# Patient Record
Sex: Female | Born: 1955
Health system: Southern US, Community
[De-identification: ages and names within clinical notes are randomized; demographics above are authoritative.]

## PROBLEM LIST (undated history)

## (undated) DIAGNOSIS — I700 Atherosclerosis of aorta: Secondary | ICD-10-CM

## (undated) DIAGNOSIS — R0609 Other forms of dyspnea: Secondary | ICD-10-CM

## (undated) DIAGNOSIS — I209 Angina pectoris, unspecified: Secondary | ICD-10-CM

## (undated) DIAGNOSIS — M069 Rheumatoid arthritis, unspecified: Secondary | ICD-10-CM

## (undated) DIAGNOSIS — G4733 Obstructive sleep apnea (adult) (pediatric): Secondary | ICD-10-CM

## (undated) DIAGNOSIS — I872 Venous insufficiency (chronic) (peripheral): Secondary | ICD-10-CM

## (undated) DIAGNOSIS — J45909 Unspecified asthma, uncomplicated: Secondary | ICD-10-CM

## (undated) DIAGNOSIS — R0600 Dyspnea, unspecified: Secondary | ICD-10-CM

## (undated) DIAGNOSIS — E119 Type 2 diabetes mellitus without complications: Secondary | ICD-10-CM

## (undated) DIAGNOSIS — M359 Systemic involvement of connective tissue, unspecified: Secondary | ICD-10-CM

## (undated) DIAGNOSIS — K5790 Diverticulosis of intestine, part unspecified, without perforation or abscess without bleeding: Secondary | ICD-10-CM

## (undated) DIAGNOSIS — I10 Essential (primary) hypertension: Secondary | ICD-10-CM

## (undated) DIAGNOSIS — I739 Peripheral vascular disease, unspecified: Secondary | ICD-10-CM

## (undated) DIAGNOSIS — E785 Hyperlipidemia, unspecified: Secondary | ICD-10-CM

## (undated) DIAGNOSIS — M1990 Unspecified osteoarthritis, unspecified site: Secondary | ICD-10-CM

## (undated) DIAGNOSIS — K219 Gastro-esophageal reflux disease without esophagitis: Secondary | ICD-10-CM

## (undated) DIAGNOSIS — Z79899 Other long term (current) drug therapy: Secondary | ICD-10-CM

## (undated) DIAGNOSIS — N289 Disorder of kidney and ureter, unspecified: Secondary | ICD-10-CM

## (undated) DIAGNOSIS — J189 Pneumonia, unspecified organism: Secondary | ICD-10-CM

## (undated) DIAGNOSIS — I2510 Atherosclerotic heart disease of native coronary artery without angina pectoris: Secondary | ICD-10-CM

## (undated) DIAGNOSIS — D649 Anemia, unspecified: Secondary | ICD-10-CM

## (undated) DIAGNOSIS — E039 Hypothyroidism, unspecified: Secondary | ICD-10-CM

## (undated) DIAGNOSIS — L4050 Arthropathic psoriasis, unspecified: Secondary | ICD-10-CM

## (undated) DIAGNOSIS — E538 Deficiency of other specified B group vitamins: Secondary | ICD-10-CM

## (undated) DIAGNOSIS — J449 Chronic obstructive pulmonary disease, unspecified: Secondary | ICD-10-CM

## (undated) DIAGNOSIS — M810 Age-related osteoporosis without current pathological fracture: Secondary | ICD-10-CM

## (undated) DIAGNOSIS — C73 Malignant neoplasm of thyroid gland: Secondary | ICD-10-CM

## (undated) DIAGNOSIS — G5600 Carpal tunnel syndrome, unspecified upper limb: Secondary | ICD-10-CM

## (undated) DIAGNOSIS — I890 Lymphedema, not elsewhere classified: Secondary | ICD-10-CM

## (undated) DIAGNOSIS — R600 Localized edema: Secondary | ICD-10-CM

## (undated) DIAGNOSIS — I509 Heart failure, unspecified: Secondary | ICD-10-CM

## (undated) DIAGNOSIS — M75121 Complete rotator cuff tear or rupture of right shoulder, not specified as traumatic: Secondary | ICD-10-CM

## (undated) DIAGNOSIS — G4730 Sleep apnea, unspecified: Secondary | ICD-10-CM

## (undated) DIAGNOSIS — Z7960 Long term (current) use of unspecified immunomodulators and immunosuppressants: Secondary | ICD-10-CM

## (undated) DIAGNOSIS — M25561 Pain in right knee: Secondary | ICD-10-CM

## (undated) DIAGNOSIS — Z7982 Long term (current) use of aspirin: Secondary | ICD-10-CM

## (undated) HISTORY — DX: Chronic obstructive pulmonary disease, unspecified: J44.9

## (undated) HISTORY — PX: COLON SURGERY: SHX602

## (undated) HISTORY — DX: Rheumatoid arthritis, unspecified: M06.9

## (undated) HISTORY — DX: Anemia, unspecified: D64.9

## (undated) HISTORY — DX: Hyperlipidemia, unspecified: E78.5

## (undated) HISTORY — DX: Gastro-esophageal reflux disease without esophagitis: K21.9

## (undated) HISTORY — PX: CARDIAC SURGERY: SHX584

## (undated) HISTORY — DX: Lymphedema, not elsewhere classified: I89.0

## (undated) HISTORY — DX: Sleep apnea, unspecified: G47.30

## (undated) HISTORY — DX: Malignant neoplasm of thyroid gland: C73

---

## 1994-03-14 HISTORY — PX: OTHER SURGICAL HISTORY: SHX169

## 1997-03-14 HISTORY — PX: OTHER SURGICAL HISTORY: SHX169

## 1998-03-14 HISTORY — PX: BREAST BIOPSY: SHX20

## 1999-03-15 HISTORY — PX: OTHER SURGICAL HISTORY: SHX169

## 2001-03-14 HISTORY — PX: ABDOMINAL HYSTERECTOMY: SHX81

## 2004-03-14 DIAGNOSIS — I219 Acute myocardial infarction, unspecified: Secondary | ICD-10-CM

## 2004-03-14 HISTORY — DX: Acute myocardial infarction, unspecified: I21.9

## 2004-03-25 ENCOUNTER — Ambulatory Visit: Payer: Self-pay | Admitting: Podiatry

## 2004-03-31 ENCOUNTER — Ambulatory Visit: Payer: Self-pay | Admitting: Podiatry

## 2004-05-03 ENCOUNTER — Ambulatory Visit: Payer: Self-pay | Admitting: Family Medicine

## 2004-06-11 ENCOUNTER — Ambulatory Visit: Payer: Self-pay | Admitting: Podiatry

## 2004-06-12 ENCOUNTER — Emergency Department: Payer: Self-pay | Admitting: Emergency Medicine

## 2005-01-06 ENCOUNTER — Inpatient Hospital Stay: Payer: Self-pay | Admitting: Internal Medicine

## 2005-01-06 ENCOUNTER — Other Ambulatory Visit: Payer: Self-pay

## 2005-01-06 DIAGNOSIS — I214 Non-ST elevation (NSTEMI) myocardial infarction: Secondary | ICD-10-CM

## 2005-01-06 HISTORY — DX: Non-ST elevation (NSTEMI) myocardial infarction: I21.4

## 2005-01-07 ENCOUNTER — Other Ambulatory Visit: Payer: Self-pay

## 2005-01-07 DIAGNOSIS — I2510 Atherosclerotic heart disease of native coronary artery without angina pectoris: Secondary | ICD-10-CM

## 2005-01-07 HISTORY — DX: Atherosclerotic heart disease of native coronary artery without angina pectoris: I25.10

## 2005-01-07 HISTORY — PX: LEFT HEART CATH AND CORONARY ANGIOGRAPHY: CATH118249

## 2005-01-11 ENCOUNTER — Ambulatory Visit: Payer: Self-pay | Admitting: Internal Medicine

## 2005-05-04 ENCOUNTER — Ambulatory Visit: Payer: Self-pay | Admitting: Family Medicine

## 2006-02-06 ENCOUNTER — Other Ambulatory Visit: Payer: Self-pay

## 2006-02-07 ENCOUNTER — Observation Stay: Payer: Self-pay | Admitting: Internal Medicine

## 2006-05-09 ENCOUNTER — Ambulatory Visit: Payer: Self-pay | Admitting: Family Medicine

## 2006-08-14 ENCOUNTER — Ambulatory Visit: Payer: Self-pay | Admitting: Internal Medicine

## 2006-10-17 ENCOUNTER — Ambulatory Visit: Payer: Self-pay | Admitting: Gastroenterology

## 2007-05-21 ENCOUNTER — Ambulatory Visit: Payer: Self-pay | Admitting: Family Medicine

## 2008-01-04 ENCOUNTER — Ambulatory Visit: Payer: Self-pay | Admitting: Internal Medicine

## 2008-01-10 ENCOUNTER — Ambulatory Visit: Payer: Self-pay | Admitting: Internal Medicine

## 2008-01-10 HISTORY — PX: LEFT HEART CATH AND CORONARY ANGIOGRAPHY: CATH118249

## 2008-06-05 ENCOUNTER — Ambulatory Visit: Payer: Self-pay | Admitting: Family Medicine

## 2008-11-13 ENCOUNTER — Ambulatory Visit: Payer: Self-pay | Admitting: Unknown Physician Specialty

## 2009-03-19 ENCOUNTER — Ambulatory Visit: Payer: Self-pay | Admitting: Unknown Physician Specialty

## 2010-04-20 ENCOUNTER — Ambulatory Visit: Payer: Self-pay | Admitting: Unknown Physician Specialty

## 2010-10-11 ENCOUNTER — Ambulatory Visit: Payer: Self-pay

## 2011-04-28 ENCOUNTER — Ambulatory Visit: Payer: Self-pay | Admitting: Internal Medicine

## 2011-05-05 ENCOUNTER — Ambulatory Visit: Payer: Self-pay | Admitting: Ophthalmology

## 2011-05-05 LAB — HEMOGLOBIN: HGB: 12.3 g/dL (ref 12.0–16.0)

## 2011-05-18 ENCOUNTER — Ambulatory Visit: Payer: Self-pay | Admitting: Ophthalmology

## 2011-06-24 ENCOUNTER — Ambulatory Visit: Payer: Self-pay | Admitting: Ophthalmology

## 2011-06-24 LAB — HEMOGLOBIN: HGB: 12.1 g/dL (ref 12.0–16.0)

## 2011-07-05 ENCOUNTER — Ambulatory Visit: Payer: Self-pay | Admitting: Ophthalmology

## 2012-01-04 ENCOUNTER — Ambulatory Visit: Payer: Self-pay | Admitting: Internal Medicine

## 2012-01-04 HISTORY — PX: LEFT HEART CATH AND CORONARY ANGIOGRAPHY: CATH118249

## 2012-03-14 DIAGNOSIS — Z9841 Cataract extraction status, right eye: Secondary | ICD-10-CM

## 2012-03-14 HISTORY — PX: CATARACT EXTRACTION, BILATERAL: SHX1313

## 2012-03-14 HISTORY — DX: Cataract extraction status, left eye: Z98.41

## 2012-03-30 ENCOUNTER — Ambulatory Visit: Payer: Self-pay | Admitting: Otolaryngology

## 2012-06-08 ENCOUNTER — Ambulatory Visit: Payer: Self-pay | Admitting: Otolaryngology

## 2013-03-14 HISTORY — PX: BREAST BIOPSY: SHX20

## 2013-03-14 HISTORY — PX: OTHER SURGICAL HISTORY: SHX169

## 2013-03-14 HISTORY — PX: VEIN SURGERY: SHX48

## 2013-05-14 ENCOUNTER — Ambulatory Visit: Payer: Self-pay | Admitting: Physician Assistant

## 2013-05-28 ENCOUNTER — Ambulatory Visit: Payer: Self-pay | Admitting: Physician Assistant

## 2013-06-17 ENCOUNTER — Ambulatory Visit: Payer: Self-pay | Admitting: Physician Assistant

## 2013-06-20 LAB — PATHOLOGY REPORT

## 2014-05-04 ENCOUNTER — Emergency Department: Payer: Self-pay | Admitting: Emergency Medicine

## 2014-06-18 ENCOUNTER — Ambulatory Visit
Admit: 2014-06-18 | Disposition: A | Discharge status: Home / Self Care | Payer: Self-pay | Attending: Physician Assistant | Admitting: Physician Assistant

## 2014-07-06 NOTE — Op Note (Signed)
PATIENT NAME:  Jacqueline Rhodes, DEPREY MR#:  [id number] DATE OF BIRTH:  01-07-1956  DATE OF PROCEDURE:  05/18/2011  PREOPERATIVE DIAGNOSIS: Visually significant cataract of the left eye.   POSTOPERATIVE DIAGNOSIS: Visually significant cataract of the left eye.   OPERATIVE PROCEDURE: Cataract extraction by phacoemulsification with implant of intraocular lens to left eye.   SURGEON: Birder Robson, MD.   ANESTHESIA:  1. Managed anesthesia care.  2. Topical tetracaine drops followed by 2% Xylocaine jelly applied in the preoperative holding area.   COMPLICATIONS: None.   TECHNIQUE:  Stop and chop  DESCRIPTION OF PROCEDURE: The patient was examined and consented in the preoperative holding area where the aforementioned topical anesthesia was applied to the left eye and then brought back to the Operating Room where the left eye was prepped and draped in the usual sterile ophthalmic fashion and a lid speculum was placed. A paracentesis was created with the side port blade and the anterior chamber was filled with viscoelastic. A near clear corneal incision was performed with the steel keratome. A continuous curvilinear capsulorrhexis was performed with a cystotome followed by the capsulorrhexis forceps. Hydrodissection and hydrodelineation were carried out with BSS on a blunt cannula. The lens was removed in a stop and chop technique and the remaining cortical material was removed with the irrigation-aspiration handpiece. The capsular bag was inflated with viscoelastic and the Technis ZCBOO 22.5-diopter lens, serial number [id number], was placed in the capsular bag without complication. The remaining viscoelastic was removed from the eye with the irrigation-aspiration handpiece. The wounds were hydrated. The anterior chamber was flushed with Miostat and the eye was inflated to physiologic pressure. The wounds were found to be water tight. The eye was dressed with Vigamox. The patient was given protective  glasses to wear throughout the day and a shield with which to sleep tonight. The patient was also given drops with which to begin a drop regimen today and will follow-up with me in one day.   ____________________________ Livingston Diones. Sher Hellinger, MD wlp:cbb D: 05/18/2011 16:59:09 ET T: 05/18/2011 17:46:54 ET JOB#: [id number]  cc: Ranulfo Kall L. Gerardo Territo, MD, <Dictator> Livingston Diones Morganna Styles MD ELECTRONICALLY SIGNED 05/19/2011 14:36

## 2014-07-06 NOTE — Op Note (Signed)
PATIENT NAME:  Jacqueline Rhodes, Jacqueline Rhodes MR#:  [id number] DATE OF BIRTH:  1955-08-09  DATE OF PROCEDURE:  07/05/2011  PREOPERATIVE DIAGNOSIS: Visually significant cataract of the right eye.   POSTOPERATIVE DIAGNOSIS: Visually significant cataract of the right eye.   OPERATIVE PROCEDURE: Cataract extraction by phacoemulsification with implant of intraocular lens to right eye.   SURGEON: Birder Robson, MD.   ANESTHESIA:  1. Managed anesthesia care.  2. Topical tetracaine drops followed by 2% Xylocaine jelly applied in the preoperative holding area.   COMPLICATIONS: None.   TECHNIQUE:  Stop-and-chop    DESCRIPTION OF PROCEDURE: The patient was examined and consented in the preoperative holding area where the aforementioned topical anesthesia was applied to the right eye and then brought back to the Operating Room where the right eye was prepped and draped in the usual sterile ophthalmic fashion and a lid speculum was placed. A paracentesis was created with the side port blade and the anterior chamber was filled with viscoelastic. A near clear corneal incision was performed with the steel keratome. A continuous curvilinear capsulorrhexis was performed with a cystotome followed by the capsulorrhexis forceps. Hydrodissection and hydrodelineation were carried out with BSS on a blunt cannula. The lens was removed in a stop-and-chop technique and the remaining cortical material was removed with the irrigation-aspiration handpiece. The capsular bag was inflated with viscoelastic and the Technus ZCB00 23.0-diopter lens, serial number [id number] was placed in the capsular bag without complication. The remaining viscoelastic was removed from the eye with the irrigation-aspiration handpiece. The wounds were hydrated. The anterior chamber was flushed with Miostat and the eye was inflated to physiologic pressure. The wounds were found to be water tight. The eye was dressed with Vigamox. The patient was given  protective glasses to wear throughout the day and a shield with which to sleep tonight. The patient was also given drops with which to begin a drop regimen today and will follow-up with me in one day.   ____________________________ Livingston Diones. Nathanyel Defenbaugh, MD wlp:drc D: 07/05/2011 12:28:52 ET T: 07/05/2011 12:36:49 ET JOB#: [id number]  cc: Etienne Millward L. Asna Muldrow, MD, <Dictator> Livingston Diones Bryannah Boston MD ELECTRONICALLY SIGNED 07/12/2011 11:41

## 2015-03-15 HISTORY — PX: BIOPSY THYROID: PRO38

## 2015-03-24 ENCOUNTER — Other Ambulatory Visit: Payer: Self-pay | Admitting: Specialist

## 2015-03-24 DIAGNOSIS — R059 Cough, unspecified: Secondary | ICD-10-CM

## 2015-03-24 DIAGNOSIS — R0602 Shortness of breath: Secondary | ICD-10-CM

## 2015-03-24 DIAGNOSIS — R05 Cough: Secondary | ICD-10-CM

## 2015-03-30 ENCOUNTER — Emergency Department: Payer: BLUE CROSS/BLUE SHIELD

## 2015-03-30 ENCOUNTER — Emergency Department
Admission: EM | Admit: 2015-03-30 | Discharge: 2015-03-30 | Disposition: A | Discharge status: Home / Self Care | Payer: BLUE CROSS/BLUE SHIELD | Attending: Emergency Medicine | Admitting: Emergency Medicine

## 2015-03-30 ENCOUNTER — Encounter: Payer: Self-pay | Admitting: *Deleted

## 2015-03-30 DIAGNOSIS — Z880 Allergy status to penicillin: Secondary | ICD-10-CM | POA: Diagnosis not present

## 2015-03-30 DIAGNOSIS — I2510 Atherosclerotic heart disease of native coronary artery without angina pectoris: Secondary | ICD-10-CM | POA: Insufficient documentation

## 2015-03-30 DIAGNOSIS — E119 Type 2 diabetes mellitus without complications: Secondary | ICD-10-CM | POA: Diagnosis not present

## 2015-03-30 DIAGNOSIS — R000 Tachycardia, unspecified: Secondary | ICD-10-CM | POA: Diagnosis not present

## 2015-03-30 DIAGNOSIS — R509 Fever, unspecified: Secondary | ICD-10-CM | POA: Diagnosis present

## 2015-03-30 DIAGNOSIS — J159 Unspecified bacterial pneumonia: Secondary | ICD-10-CM | POA: Insufficient documentation

## 2015-03-30 DIAGNOSIS — J189 Pneumonia, unspecified organism: Secondary | ICD-10-CM

## 2015-03-30 DIAGNOSIS — I10 Essential (primary) hypertension: Secondary | ICD-10-CM | POA: Insufficient documentation

## 2015-03-30 HISTORY — DX: Essential (primary) hypertension: I10

## 2015-03-30 HISTORY — DX: Type 2 diabetes mellitus without complications: E11.9

## 2015-03-30 HISTORY — DX: Unspecified osteoarthritis, unspecified site: M19.90

## 2015-03-30 HISTORY — DX: Atherosclerotic heart disease of native coronary artery without angina pectoris: I25.10

## 2015-03-30 HISTORY — DX: Systemic involvement of connective tissue, unspecified: M35.9

## 2015-03-30 LAB — CBC WITH DIFFERENTIAL/PLATELET
Basophils Absolute: 0.1 10*3/uL (ref 0–0.1)
Basophils Relative: 1 %
EOS PCT: 0 %
Eosinophils Absolute: 0 10*3/uL (ref 0–0.7)
HEMATOCRIT: 40.9 % (ref 35.0–47.0)
HEMOGLOBIN: 13.5 g/dL (ref 12.0–16.0)
LYMPHS ABS: 1.1 10*3/uL (ref 1.0–3.6)
LYMPHS PCT: 8 %
MCH: 28.5 pg (ref 26.0–34.0)
MCHC: 33.1 g/dL (ref 32.0–36.0)
MCV: 86.1 fL (ref 80.0–100.0)
Monocytes Absolute: 0.5 10*3/uL (ref 0.2–0.9)
Monocytes Relative: 4 %
NEUTROS PCT: 87 %
Neutro Abs: 10.9 10*3/uL — ABNORMAL HIGH (ref 1.4–6.5)
Platelets: 242 10*3/uL (ref 150–440)
RBC: 4.75 MIL/uL (ref 3.80–5.20)
RDW: 15.1 % — ABNORMAL HIGH (ref 11.5–14.5)
WBC: 12.6 10*3/uL — AB (ref 3.6–11.0)

## 2015-03-30 LAB — COMPREHENSIVE METABOLIC PANEL
ALBUMIN: 3.8 g/dL (ref 3.5–5.0)
ALK PHOS: 64 U/L (ref 38–126)
ALT: 17 U/L (ref 14–54)
ANION GAP: 10 (ref 5–15)
AST: 15 U/L (ref 15–41)
BUN: 31 mg/dL — ABNORMAL HIGH (ref 6–20)
CALCIUM: 8.6 mg/dL — AB (ref 8.9–10.3)
CHLORIDE: 102 mmol/L (ref 101–111)
CO2: 25 mmol/L (ref 22–32)
Creatinine, Ser: 0.91 mg/dL (ref 0.44–1.00)
GFR calc non Af Amer: >60 mL/min (ref >60)
GLUCOSE: 125 mg/dL — AB (ref 65–99)
POTASSIUM: 3.5 mmol/L (ref 3.5–5.1)
SODIUM: 137 mmol/L (ref 135–145)
Total Bilirubin: 0.8 mg/dL (ref 0.3–1.2)
Total Protein: 7.2 g/dL (ref 6.5–8.1)

## 2015-03-30 LAB — URINALYSIS COMPLETE WITH MICROSCOPIC (ARMC ONLY)
BACTERIA UA: NONE SEEN
Bilirubin Urine: NEGATIVE
GLUCOSE, UA: NEGATIVE mg/dL
Ketones, ur: NEGATIVE mg/dL
Leukocytes, UA: NEGATIVE
NITRITE: NEGATIVE
Protein, ur: NEGATIVE mg/dL
SPECIFIC GRAVITY, URINE: 1.026 (ref 1.005–1.030)
pH: 5 (ref 5.0–8.0)

## 2015-03-30 MED ORDER — ACETAMINOPHEN 500 MG PO TABS
1000.0000 mg | ORAL_TABLET | Freq: Once | ORAL | Status: AC
  Administered 2015-03-30: 1000 mg via ORAL
  Filled 2015-03-30: qty 2

## 2015-03-30 MED ORDER — CEPHALEXIN 500 MG PO CAPS: 500.0000 mg | ORAL_CAPSULE | Freq: Four times a day (QID) | ORAL | Status: DC

## 2015-03-30 MED ORDER — AZITHROMYCIN 250 MG PO TABS: 250.0000 mg | ORAL_TABLET | Freq: Every day | ORAL | Status: AC

## 2015-03-30 MED ORDER — SODIUM CHLORIDE 0.9 % IV BOLUS (SEPSIS)
1000.0000 mL | Freq: Once | INTRAVENOUS | Status: AC
  Administered 2015-03-30: 1000 mL via INTRAVENOUS

## 2015-03-30 MED ORDER — AZITHROMYCIN 250 MG PO TABS
500.0000 mg | ORAL_TABLET | Freq: Once | ORAL | Status: AC
  Administered 2015-03-30: 500 mg via ORAL
  Filled 2015-03-30: qty 2

## 2015-03-30 MED ORDER — LEVOFLOXACIN IN D5W 750 MG/150ML IV SOLN
750.0000 mg | Freq: Once | INTRAVENOUS | Status: DC
  Filled 2015-03-30: qty 150

## 2015-03-30 MED ORDER — ALBUTEROL SULFATE (2.5 MG/3ML) 0.083% IN NEBU
1.2500 mg | INHALATION_SOLUTION | Freq: Once | RESPIRATORY_TRACT | Status: AC
  Administered 2015-03-30: 1.25 mg via RESPIRATORY_TRACT
  Filled 2015-03-30: qty 3

## 2015-03-30 MED ORDER — IOHEXOL 350 MG/ML SOLN
75.0000 mL | Freq: Once | INTRAVENOUS | Status: AC | PRN
  Administered 2015-03-30: 75 mL via INTRAVENOUS

## 2015-03-30 MED ORDER — METHYLPREDNISOLONE SODIUM SUCC 125 MG IJ SOLR
60.0000 mg | Freq: Once | INTRAMUSCULAR | Status: AC
  Administered 2015-03-30: 60 mg via INTRAVENOUS
  Filled 2015-03-30: qty 2

## 2015-03-30 MED ORDER — DEXTROSE 5 % IV SOLN
1.0000 g | Freq: Once | INTRAVENOUS | Status: AC
  Administered 2015-03-30: 1 g via INTRAVENOUS
  Filled 2015-03-30: qty 10

## 2015-03-30 NOTE — ED Provider Notes (Signed)
[facility] Emergency Department Provider Note  ____________________________________________  Time seen: 77  I have reviewed the triage vital signs and the nursing notes.  History by:  Patient  HISTORY  Chief Complaint Fever and Cough     HPI Jacqueline Rhodes is a 60 y.o. female presents to the emergency department with a bad cough that has been occurring on and off for almost 2 months. She has seen her regular doctor and a pulmonologist. She reports being on antibiotics for times, doxycycline twice and Levaquin twice. She has had some occasional fever over the past 2 months. She presents to the emergency department today because she has had increased coughing and had a notable fever with chills this evening. She reports her temperature initially was 100.2, but then increase to 101. She did take 400 mg of ibuprofen at approximately 4:30. She uses Advair and albuterol. She has been on and off prednisone tapers and is currently on steady 10 mg of prednisone a day.  Patient does have a cardiac history with coronary artery disease. She denies any focal chest pain.   Past Medical History  Diagnosis Date  . Hypertension   . Diabetes mellitus without complication ([place])   . Arthritis   . Coronary artery disease   . Collagen vascular disease ([place])     There are no active problems to display for this patient.   History reviewed. No pertinent past surgical history.  Current Outpatient Rx  Name  Route  Sig  Dispense  Refill  . azithromycin (ZITHROMAX) 250 MG tablet   Oral   Take 1 tablet (250 mg total) by mouth daily. Take 1 tablet daily for 3 days.   4 tablet   0   . cephALEXin (KEFLEX) 500 MG capsule   Oral   Take 1 capsule (500 mg total) by mouth 4 (four) times daily.   28 capsule   0     Allergies Codeine; Flexeril; Penicillins; and Vicodin  History reviewed. No pertinent family history.  Social History Social History  Substance Use Topics   . Smoking status: Never Smoker   . Smokeless tobacco: None  . Alcohol Use: None    Review of Systems  Constitutional: Positive for fever and chills. ENT: Positive for congestion. Cardiovascular: Negative for chest pain. Respiratory: Positive for cough, shortness of breath, and fever. See history of present illness Gastrointestinal: Negative for abdominal pain, vomiting and diarrhea. Genitourinary: Negative for dysuria. Musculoskeletal: No back pain. Skin: Negative for rash. Neurological: Negative for headache or focal weakness   10-point ROS otherwise negative.  ____________________________________________   PHYSICAL EXAM:  VITAL SIGNS: ED Triage Vitals  Enc Vitals Group     BP 03/30/15 1804 147/65 mmHg     Pulse Rate 03/30/15 1804 127     Resp 03/30/15 1804 18     Temp 03/30/15 1804 100.5 F (38.1 C)     Temp Source 03/30/15 1804 Oral     SpO2 03/30/15 1804 93 %     Weight 03/30/15 1804 250 lb (113.399 kg)     Height 03/30/15 1804 5\' 4"  (1.626 m)     Head Cir --      Peak Flow --      Pain Score 03/30/15 1804 9     Pain Loc --      Pain Edu? --      Excl. in [place]? --     Constitutional: Alert and oriented. Notable cough and appearance of congestion and illness.  ENT   Head: Normocephalic and atraumatic.   Nose: Notable rhinorrhea and congestion..       Mouth: No erythema, no swelling   Cardiovascular: Tachycardic at 110, regular rhythm, no murmur noted Respiratory: Notable cough. Mild tachypnea. Coarse breath sounds bilaterally with minimal wheeze.  Gastrointestinal: Soft, no distention. Nontender Back: No muscle spasm, no tenderness, no CVA tenderness. Musculoskeletal: No deformity noted. Nontender with normal range of motion in all extremities.  No noted edema. Neurologic:  Communicative. Normal appearing spontaneous movement in all 4 extremities. No gross focal neurologic deficits are appreciated.  Skin:  Skin is warm, dry. No rash noted. Psychiatric:  Mood and affect are normal. Speech and behavior are normal.  ____________________________________________    LABS (pertinent positives/negatives)  Labs Reviewed  COMPREHENSIVE METABOLIC PANEL - Abnormal; Notable for the following:    Glucose, Bld 125 (*)    BUN 31 (*)    Calcium 8.6 (*)    All other components within normal limits  URINALYSIS COMPLETEWITH MICROSCOPIC ([facility] ONLY) - Abnormal; Notable for the following:    Color, Urine YELLOW (*)    APPearance CLEAR (*)    Hgb urine dipstick 2+ (*)    Squamous Epithelial / LPF 0-5 (*)    All other components within normal limits  CBC WITH DIFFERENTIAL/PLATELET - Abnormal; Notable for the following:    WBC 12.6 (*)    RDW 15.1 (*)    Neutro Abs 10.9 (*)    All other components within normal limits  CULTURE, BLOOD (ROUTINE X 2)  CULTURE, BLOOD (ROUTINE X 2)     ____________________________________________   EKG  ED ECG REPORT I, Shelonda Saxe W, the attending physician, personally viewed and interpreted this ECG.   Date: 03/30/2015  EKG Time: 1813  Rate: 118  Rhythm: Sinus tachycardia  Axis: Rightward axis at 92  Intervals: Normal  ST&T Change: None noted   ____________________________________________    RADIOLOGY  Chest x-ray: FINDINGS: The heart is upper limits of normal in size. There is tortuosity of the thoracic aorta. Chronic appearing bronchitic type lung changes and probable underlying emphysema. Suspect superimposed bronchitis and right middle lobe pneumonia. No pleural effusion. The bony thorax is intact.  IMPRESSION: Bronchitis and right middle lobe pneumonia.  CT chest: IMPRESSION: Patchy airspace consolidation in the right middle lobe, with smaller areas of probable airspace consolidation in the right lower lobe and left lower lobe. The findings are most suggestive of multifocal pneumonia. Follow-up to resolution after empiric treatment is recommended.  Enlargement of the left lobe of  the thyroid gland, containing calcifications. Follow-up with thyroid ultrasound should be considered, as thyroid cancer cannot be excluded.  Large hiatal hernia.  14 mm right 6 o'clock breast mass. Correlation with mammography is recommended.  Atherosclerotic disease of the aorta and coronary arteries. ____________________________________________   PROCEDURES  ____________________________________________   INITIAL IMPRESSION / ASSESSMENT AND PLAN / ED COURSE  Pertinent labs & imaging results that were available during my care of the patient were reviewed by me and considered in my medical decision making (see chart for details).  60 year old female with prolonged symptoms of bronchitis with possible underlying pneumonia. Today's chest x-ray does show a right middle lobe pneumonia. Given that she has been on doxycycline and Levaquin but is continuing to have some form of a pulmonary infection, we will change the coverage by using azithromycin. We will also give her ceftriaxone. I have discussed the patient's penicillin allergy with her. She gets hives. I discussed the use of  a cephalosporin with her and we agree on this treatment. She is receiving 1 L of normal saline due to her tachycardia. We'll also treated with Tylenol for the fever and with a small dose, 1.25 mg, of albuterol.  ----------------------------------------- 8:45 PM on 03/30/2015 -----------------------------------------  Patient appears better than when she initially arrived. Her heart rate is now ranging between 95 and 100. She still has a bit of a cough, some coarse breath sounds, and the appearance of someone who has bronchitis. Dr. Raul Del apparently was going to have a CT scan performed on this patient on Wednesday. As she is in the emergency department with some questions about her pulmonary condition, we'll get a CT now.  ----------------------------------------- 10:16 PM on  03/30/2015 -----------------------------------------  CT confirms patchy airspace consolidation in the right middle lobe with a smaller area in the right lower lobe and left lower lobe.  The patient's heart rate is 92. Her oxygen saturation level has been good throughout. Will continue her on azithromycin as well as Keflex given the difficult and complicated course she has had.   ____________________________________________   FINAL CLINICAL IMPRESSION(S) / ED DIAGNOSES  Final diagnoses:  Community acquired pneumonia      Ahmed Prima, MD 03/30/15 2310

## 2015-03-30 NOTE — ED Notes (Addendum)
States fever, chills, vomiting, and sinus pain, pt states blood tinged productive cough, generalized body aches, states her PCP checked her o2 level rectently and told her it was low and she is scheduled for a CT scan of her chest

## 2015-03-30 NOTE — ED Notes (Signed)
MD at bedside. 

## 2015-03-30 NOTE — Discharge Instructions (Signed)
Your chest x-ray shows a pneumonia in the right middle lobe. CT scan sees small patchy areas in the right lower and left lower as. Take azithromycin as prescribed as well as Keflex. Continue to take the prednisone that has been prescribed for you. Follow with her primary physician as well as with Dr. Raul Del. Return to the emergency department if you have further shortness of breath or if you have any other urgent concerns.  Community-Acquired Pneumonia, Adult Pneumonia is an infection of the lungs. There are different types of pneumonia. One type can develop while a person is in a hospital. A different type, called community-acquired pneumonia, develops in people who are not, or have not recently been, in the hospital or other health care facility.  CAUSES Pneumonia may be caused by bacteria, viruses, or funguses. Community-acquired pneumonia is often caused by Streptococcus pneumonia bacteria. These bacteria are often passed from one person to another by breathing in droplets from the cough or sneeze of an infected person. RISK FACTORS The condition is more likely to develop in:  People who havechronic diseases, such as chronic obstructive pulmonary disease (COPD), asthma, congestive heart failure, cystic fibrosis, diabetes, or kidney disease.  People who haveearly-stage or late-stage HIV.  People who havesickle cell disease.  People who havehad their spleen removed (splenectomy).  People who havepoor Human resources officer.  People who havemedical conditions that increase the risk of breathing in (aspirating) secretions their own mouth and nose.   People who havea weakened immune system (immunocompromised).  People who smoke.  People whotravel to areas where pneumonia-causing germs commonly exist.  People whoare around animal habitats or animals that have pneumonia-causing germs, including birds, bats, rabbits, cats, and farm animals. SYMPTOMS Symptoms of this condition  include:  Adry cough.  A wet (productive) cough.  Fever.  Sweating.  Chest pain, especially when breathing deeply or coughing.  Rapid breathing or difficulty breathing.  Shortness of breath.  Shaking chills.  Fatigue.  Muscle aches. DIAGNOSIS Your health care provider will take a medical history and perform a physical exam. You may also have other tests, including:  Imaging studies of your chest, including X-rays.  Tests to check your blood oxygen level and other blood gases.  Other tests on blood, mucus (sputum), fluid around your lungs (pleural fluid), and urine. If your pneumonia is severe, other tests may be done to identify the specific cause of your illness. TREATMENT The type of treatment that you receive depends on many factors, such as the cause of your pneumonia, the medicines you take, and other medical conditions that you have. For most adults, treatment and recovery from pneumonia may occur at home. In some cases, treatment must happen in a hospital. Treatment may include:  Antibiotic medicines, if the pneumonia was caused by bacteria.  Antiviral medicines, if the pneumonia was caused by a virus.  Medicines that are given by mouth or through an IV tube.  Oxygen.  Respiratory therapy. Although rare, treating severe pneumonia may include:  Mechanical ventilation. This is done if you are not breathing well on your own and you cannot maintain a safe blood oxygen level.  Thoracentesis. This procedureremoves fluid around one lung or both lungs to help you breathe better. HOME CARE INSTRUCTIONS  Take over-the-counter and prescription medicines only as told by your health care provider.  Only takecough medicine if you are losing sleep. Understand that cough medicine can prevent your body's natural ability to remove mucus from your lungs.  If you were prescribed an  antibiotic medicine, take it as told by your health care provider. Do not stop taking the  antibiotic even if you start to feel better.  Sleep in a semi-upright position at night. Try sleeping in a reclining chair, or place a few pillows under your head.  Do not use tobacco products, including cigarettes, chewing tobacco, and e-cigarettes. If you need help quitting, ask your health care provider.  Drink enough water to keep your urine clear or pale yellow. This will help to thin out mucus secretions in your lungs. PREVENTION There are ways that you can decrease your risk of developing community-acquired pneumonia. Consider getting a pneumococcal vaccine if:  You are older than 62 years of age.  You are older than 62 years of age and are undergoing cancer treatment, have chronic lung disease, or have other medical conditions that affect your immune system. Ask your health care provider if this applies to you. There are different types and schedules of pneumococcal vaccines. Ask your health care provider which vaccination option is best for you. You may also prevent community-acquired pneumonia if you take these actions:  Get an influenza vaccine every year. Ask your health care provider which type of influenza vaccine is best for you.  Go to the dentist on a regular basis.  Wash your hands often. Use hand sanitizer if soap and water are not available. SEEK MEDICAL CARE IF:  You have a fever.  You are losing sleep because you cannot control your cough with cough medicine. SEEK IMMEDIATE MEDICAL CARE IF:  You have worsening shortness of breath.  You have increased chest pain.  Your sickness becomes worse, especially if you are an older adult or have a weakened immune system.  You cough up blood.   This information is not intended to replace advice given to you by your health care provider. Make sure you discuss any questions you have with your health care provider.   Document Released: 02/28/2005 Document Revised: 11/19/2014 Document Reviewed: 06/25/2014 Elsevier  Interactive Patient Education [employer].

## 2015-04-01 ENCOUNTER — Other Ambulatory Visit: Payer: Self-pay | Admitting: Physician Assistant

## 2015-04-01 ENCOUNTER — Ambulatory Visit: Payer: Self-pay

## 2015-04-01 DIAGNOSIS — N63 Unspecified lump in unspecified breast: Secondary | ICD-10-CM

## 2015-04-04 LAB — CULTURE, BLOOD (ROUTINE X 2)
CULTURE: NO GROWTH
Culture: NO GROWTH

## 2015-04-15 ENCOUNTER — Other Ambulatory Visit: Payer: Self-pay | Admitting: Physician Assistant

## 2015-04-15 ENCOUNTER — Ambulatory Visit
Admission: RE | Admit: 2015-04-15 | Discharge: 2015-04-15 | Disposition: A | Discharge status: Home / Self Care | Payer: BLUE CROSS/BLUE SHIELD | Source: Ambulatory Visit | Attending: Physician Assistant | Admitting: Physician Assistant

## 2015-04-15 DIAGNOSIS — N63 Unspecified lump in unspecified breast: Secondary | ICD-10-CM

## 2015-04-15 DIAGNOSIS — N6001 Solitary cyst of right breast: Secondary | ICD-10-CM | POA: Insufficient documentation

## 2015-05-14 ENCOUNTER — Ambulatory Visit: Payer: BLUE CROSS/BLUE SHIELD | Attending: Specialist

## 2015-05-14 DIAGNOSIS — G4733 Obstructive sleep apnea (adult) (pediatric): Secondary | ICD-10-CM | POA: Insufficient documentation

## 2015-05-14 DIAGNOSIS — E669 Obesity, unspecified: Secondary | ICD-10-CM | POA: Insufficient documentation

## 2015-05-14 DIAGNOSIS — G4700 Insomnia, unspecified: Secondary | ICD-10-CM | POA: Diagnosis present

## 2015-06-10 ENCOUNTER — Other Ambulatory Visit: Payer: Self-pay | Admitting: Physician Assistant

## 2015-06-10 DIAGNOSIS — Z1231 Encounter for screening mammogram for malignant neoplasm of breast: Secondary | ICD-10-CM

## 2015-06-25 ENCOUNTER — Ambulatory Visit
Admission: RE | Admit: 2015-06-25 | Discharge: 2015-06-25 | Disposition: A | Discharge status: Home / Self Care | Payer: BLUE CROSS/BLUE SHIELD | Source: Ambulatory Visit | Attending: Physician Assistant | Admitting: Physician Assistant

## 2015-06-25 DIAGNOSIS — Z1231 Encounter for screening mammogram for malignant neoplasm of breast: Secondary | ICD-10-CM | POA: Insufficient documentation

## 2015-09-05 HISTORY — PX: THYROIDECTOMY, PARTIAL: SHX18

## 2015-09-05 HISTORY — PX: OTHER SURGICAL HISTORY: SHX169

## 2015-12-03 ENCOUNTER — Other Ambulatory Visit: Payer: Self-pay | Admitting: Specialist

## 2015-12-03 DIAGNOSIS — R911 Solitary pulmonary nodule: Secondary | ICD-10-CM

## 2016-01-21 ENCOUNTER — Ambulatory Visit: Payer: BLUE CROSS/BLUE SHIELD | Attending: Otolaryngology | Admitting: Speech Pathology

## 2016-01-21 DIAGNOSIS — R490 Dysphonia: Secondary | ICD-10-CM | POA: Diagnosis present

## 2016-01-22 ENCOUNTER — Encounter: Payer: Self-pay | Admitting: Speech Pathology

## 2016-01-22 NOTE — Therapy (Signed)
[place] MAIN [facility] SERVICES [address] [place], [place], [address] Phone: [phone]   Fax:  [phone]  Speech Language Pathology Evaluation  Patient Details  Name: Jacqueline Rhodes MRN: [id number] Date of Birth: 29-May-1955 Referring Provider: Dr. Pryor Ochoa  Encounter Date: 01/21/2016      End of Session - 01/22/16 1150    Visit Number 1   Number of Visits 9   Date for SLP Re-Evaluation 03/22/16   SLP Start Time 7   SLP Stop Time  1200   SLP Time Calculation (min) 60 min   Activity Tolerance Patient tolerated treatment well      Past Medical History:  Diagnosis Date   Arthritis    Collagen vascular disease ([place])    Coronary artery disease    Diabetes mellitus without complication ([place])    Hypertension     Past Surgical History:  Procedure Laterality Date   BREAST BIOPSY Left 2000   benign   BREAST BIOPSY Right 2015   benign    There were no vitals filed for this visit.          SLP Evaluation [facility] - 01/22/16 0001      SLP Visit Information   SLP Received On 01/21/16   Referring Provider Dr. Pryor Ochoa   Onset Date 12/18/2015   Medical Diagnosis Muscle Tension Dysphonia     Subjective   Subjective "I just want to be able to talk"     General Information   HPI The patient reports 3 months of vigorous coughing secondary bronchitis evolving to pneumonia (beginning October 2016).  She reports that she has been hoarse ever since.  The patient was evaluated by Dr. Pryor Ochoa 12/18/2015; he reports bilateral muscle tension dysphonia.      Prior Functional Status   Cognitive/Linguistic Baseline Within functional limits     Oral Motor/Sensory Function   Overall Oral Motor/Sensory Function Appears within functional limits for tasks assessed     Motor Speech   Overall Motor Speech Impaired   Respiration Impaired   Level of Impairment Conversation   Phonation Aphonic;Hoarse;Low vocal intensity   Phonation  Impaired   Vocal Abuses Habitual Hyperphonia   Tension Present Jaw;Neck;Shoulder   Volume Soft   Pitch Aphonic     Standardized Assessments   Standardized Assessments  Other Assessment  Perceptual Voice Evaluation      Perceptual Voice Evaluation Voice checklist:  Health risks: GERD, allergies, frequent URI  Characteristic voice use: raising grandchildren, works as Scientist, water quality in Conservation officer, 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  Environmental risks: multiple stresses  Misuse: tension/strain, poor respiratory support  Abuse: using voice when ill from URI  Vocal characteristics: breathy, aphonic, hoarse, vocal fatigue, hypophonic, limited voice range Patient quality of life survey: VHI-10: 23 (A score of 10 or higher indicate voice handicap) Maximum phonation time for sustained ah: 1 seconds Average time patient was able to sustain /s/: 11 seconds Average time patient was able to sustain /z/: Not able to generate voiced fricative s/z ratio : N/A Visi-Pitch: Multi-Dimensional Voice Program (MDVP)  MDVP extracts objective quantitative values (Relative Average Perturbation, Shimmer, Voice Turbulence Index, and Noise to Harmonic Ratio) on sustained phonation, which are displayed graphically and numerically in comparison to a built-in normative database.  The patient was not able to generate sufficient phonation for assessment Education: Patient instructed in extrinsic laryngeal muscle stretches and breath support exercises       SLP Education - 01/22/16 1149  Education provided Yes   Education Details Role of speech therapy in voice rehabilitation   Person(s) Educated Patient   Methods Explanation   Comprehension Verbalized understanding            SLP Long Term Goals - 01/22/16 1152      SLP LONG TERM GOAL #1   Title The patient will demonstrate independent understanding of vocal hygiene concepts and extrinsic laryngeal muscle stretches.     Time 8   Period Weeks    Status New     SLP LONG TERM GOAL #2   Title The patient will be independent for abdominal breathing and breath support exercises.   Time 8   Period Weeks   Status New     SLP LONG TERM GOAL #3   Title The patient will minimize vocal tension via Yawn-Sigh approach (or comparable technique) with min SLP cues with 80% accuracy.   Time 8   Period Weeks   Status New     SLP LONG TERM GOAL #4   Title The patient will maintain relaxed phonation / oral resonance for paragraph length recitation with 80% accuracy.   Time 8   Period Weeks   Status New          Plan - 01/22/16 1158    Clinical Impression Statement This 60 year old woman with muscle tension dysphonia, is presenting with severe dysphonia.  The patient demonstrates hoarse/breathy vocal quality, reduced breath control for speech, strained/tense phonation, limited pitch range, and laryngeal tension. She will benefit from voice therapy for education, to improve breath support, improve tone focus, promote easy flow phonation, and learn techniques to increase loudness and pitch range without strain.      Patient will benefit from skilled therapeutic intervention in order to improve the following deficits and impairments:   Dysphonia - Plan: SLP plan of care cert/re-cert    Problem List There are no active problems to display for this patient.   Lou Miner 01/22/2016, 12:00 PM  [place] MAIN [facility] SERVICES [address] [place], [place], [address] Phone: [phone]   Fax:  [phone]  Name: Jacqueline Rhodes MRN: [id number] Date of Birth: 1955-12-07

## 2016-01-25 ENCOUNTER — Ambulatory Visit: Payer: BLUE CROSS/BLUE SHIELD | Admitting: Speech Pathology

## 2016-01-28 ENCOUNTER — Encounter: Payer: Self-pay | Admitting: Speech Pathology

## 2016-01-28 ENCOUNTER — Ambulatory Visit: Payer: BLUE CROSS/BLUE SHIELD | Admitting: Speech Pathology

## 2016-01-28 DIAGNOSIS — R490 Dysphonia: Secondary | ICD-10-CM

## 2016-01-28 NOTE — Therapy (Signed)
[place] MAIN [facility] SERVICES [address] [place], [place], [address] Phone: [phone]   Fax:  [phone]  Speech Language Pathology Treatment  Patient Details  Name: Jacqueline Rhodes MRN: [id number] Date of Birth: [birth year] Referring Provider: Dr. Pryor Ochoa  Encounter Date: 01/28/2016      End of Session - 01/28/16 1302    Visit Number 2   Number of Visits 9   Date for SLP Re-Evaluation 03/22/16   SLP Start Time 0900   SLP Stop Time  0950   SLP Time Calculation (min) 50 min   Activity Tolerance Patient tolerated treatment well      Past Medical History:  Diagnosis Date  . Arthritis   . Collagen vascular disease ([place])   . Coronary artery disease   . Diabetes mellitus without complication ([place])   . Hypertension     Past Surgical History:  Procedure Laterality Date  . BREAST BIOPSY Left 2000   benign  . BREAST BIOPSY Right 2015   benign    There were no vitals filed for this visit.      Subjective Assessment - 01/28/16 1300    Subjective The patient reports exposure to noxious fumes at work earlier this week.  The patient reports multiple stressors in her life.   Currently in Pain? No/denies               ADULT SLP TREATMENT - 01/28/16 0001      General Information   Behavior/Cognition Alert;Cooperative;Pleasant mood     Treatment Provided   Treatment provided Cognitive-Linquistic     Pain Assessment   Pain Assessment No/denies pain     Cognitive-Linquistic Treatment   Treatment focused on Voice   Skilled Treatment The patient was provided with written and verbal teaching regarding neck, shoulder, tongue, and throat stretches exercises to promote relaxed phonation. The patient states she is doing the stretches at home.  The patient was provided with written and verbal teaching regarding breath support exercises.  The patient is able to demonstrate accurate but weak performance. Patient instructed in  relaxed phonation / oral resonance. Maintains oral resonance in imitation of syllables and words with 70% accuracy.  .       Assessment / Recommendations / Plan   Plan Continue with current plan of care     Progression Toward Goals   Progression toward goals Progressing toward goals          SLP Education - 01/28/16 1301    Education provided Yes   Education Details Easy onset phonation   Person(s) Educated Patient   Methods Explanation   Comprehension Verbalized understanding            SLP Long Term Goals - 01/22/16 1152      SLP LONG TERM GOAL #1   Title The patient will demonstrate independent understanding of vocal hygiene concepts and extrinsic laryngeal muscle stretches.     Time 8   Period Weeks   Status New     SLP LONG TERM GOAL #2   Title The patient will be independent for abdominal breathing and breath support exercises.   Time 8   Period Weeks   Status New     SLP LONG TERM GOAL #3   Title The patient will minimize vocal tension via Yawn-Sigh approach (or comparable technique) with min SLP cues with 80% accuracy.   Time 8   Period Weeks   Status New     SLP LONG  TERM GOAL #4   Title The patient will maintain relaxed phonation / oral resonance for paragraph length recitation with 80% accuracy.   Time 8   Period Weeks   Status New          Plan - 01/28/16 1302    Clinical Impression Statement  Patient able to improve vocal quality with easy onset to improve oral resonance and decrease laryngeal strain.   Speech Therapy Frequency 1x /week   Duration Other (comment)  8 weeks   Treatment/Interventions Patient/family education;Other (comment)  Voice therapy   Potential to Achieve Goals Good   Potential Considerations Ability to learn/carryover information;Cooperation/participation level;Previous level of function;Severity of impairments;Family/community support   SLP Home Exercise Plan Extrinsic laryngeal muscle strateches, breath support  exercises; easy onset phonation   Consulted and Agree with Plan of Care Patient      Patient will benefit from skilled therapeutic intervention in order to improve the following deficits and impairments:   Dysphonia    Problem List There are no active problems to display for this patient.  [name], MS/CCC- SLP  Lou Miner 01/28/2016, 1:04 PM  [place] MAIN [facility] SERVICES [address] [place], [place], [address] Phone: [phone]   Fax:  [phone]   Name: Jacqueline Rhodes MRN: [id number] Date of Birth: March 27, 1955

## 2016-02-02 ENCOUNTER — Ambulatory Visit: Payer: BLUE CROSS/BLUE SHIELD | Admitting: Speech Pathology

## 2016-02-09 ENCOUNTER — Ambulatory Visit: Payer: BLUE CROSS/BLUE SHIELD | Admitting: Speech Pathology

## 2016-02-11 ENCOUNTER — Ambulatory Visit: Payer: BLUE CROSS/BLUE SHIELD | Admitting: Speech Pathology

## 2016-02-16 ENCOUNTER — Ambulatory Visit: Payer: BLUE CROSS/BLUE SHIELD | Admitting: Speech Pathology

## 2016-02-18 ENCOUNTER — Ambulatory Visit: Payer: BLUE CROSS/BLUE SHIELD | Admitting: Speech Pathology

## 2016-03-03 ENCOUNTER — Encounter: Payer: Self-pay | Admitting: Speech Pathology

## 2016-03-03 ENCOUNTER — Ambulatory Visit: Payer: BLUE CROSS/BLUE SHIELD | Attending: Otolaryngology | Admitting: Speech Pathology

## 2016-03-03 DIAGNOSIS — R490 Dysphonia: Secondary | ICD-10-CM

## 2016-03-03 NOTE — Therapy (Signed)
[place] MAIN [facility] SERVICES [address] [place], [place], [address] Phone: [phone]   Fax:  [phone]  Speech Language Pathology Treatment/Discharge Summary  Patient Details  Name: Jacqueline Rhodes MRN: [id number] Date of Birth: 11-29-1955 Referring Provider: Dr. Pryor Ochoa  Encounter Date: 03/03/2016      End of Session - 03/03/16 0955    Visit Number 3   Number of Visits 9   Date for SLP Re-Evaluation 03/22/16   SLP Start Time 0845   SLP Stop Time  0930   SLP Time Calculation (min) 45 min      Past Medical History:  Diagnosis Date  . Arthritis   . Collagen vascular disease ([place])   . Coronary artery disease   . Diabetes mellitus without complication ([place])   . Hypertension     Past Surgical History:  Procedure Laterality Date  . BREAST BIOPSY Left 2000   benign  . BREAST BIOPSY Right 2015   benign    There were no vitals filed for this visit.      Subjective Assessment - 03/03/16 0954    Subjective  The patient reports multiple stressors in her life and illness.               ADULT SLP TREATMENT - 03/03/16 0001      General Information   Behavior/Cognition Alert;Cooperative;Pleasant mood     Treatment Provided   Treatment provided Cognitive-Linquistic     Pain Assessment   Pain Assessment No/denies pain     Cognitive-Linquistic Treatment   Treatment focused on Voice   Skilled Treatment The patient was provided with written and verbal teaching regarding neck, shoulder, tongue, and throat stretches exercises to promote relaxed phonation. The patient states she is doing the stretches at home.  The patient was provided with written and verbal teaching regarding breath support exercises.  The patient is able to demonstrate accurate but weak performance. Patient instructed in relaxed phonation / oral resonance. Patient instructed in resonant therapy technique and is able to generate more relaxed  phonation/oral resonance in words and phrases.  She demonstrates comprehension of target of exercises and can detect more strained productions.       Assessment / Recommendations / Plan   Plan Discharge SLP treatment due to (comment);Other (Comment)  Patient cannot commit to time required for speech therapy     Progression Toward Goals   Progression toward goals Progressing toward goals  patient requests discharge          SLP Education - 03/03/16 0954    Education provided Yes   Education Details resonant therapy   Person(s) Educated Patient   Methods Explanation   Comprehension Verbalized understanding            SLP Long Term Goals - 03/03/16 0957      SLP LONG TERM GOAL #1   Title The patient will demonstrate independent understanding of vocal hygiene concepts and extrinsic laryngeal muscle stretches.     Status Achieved     SLP LONG TERM GOAL #2   Title The patient will be independent for abdominal breathing and breath support exercises.   Status Partially Met     SLP LONG TERM GOAL #3   Title The patient will minimize vocal tension via Yawn-Sigh approach (or comparable technique) with min SLP cues with 80% accuracy.   Status Partially Met     SLP LONG TERM GOAL #4   Title The patient will maintain relaxed phonation /  oral resonance for paragraph length recitation with 80% accuracy.   Status Partially Met          Plan - 03/03/16 0955    Clinical Impression Statement  Patient able to improve vocal quality with resonant therapy techniques and vocal loudness to improve oral resonance and decrease laryngeal strain.  The patient has multiple health issues (including an ongoing sinus infection) as well as multiple personal stressors in her life.  These are all having an impact on her voice as well as her ability to practice the exercises she has been given.  She has been provided with written instructions/material for practicing relaxed phonation/oral resonance and  appears to comprehend the goal.  The patient states that she has too many things going on for the next 3-4 months and cannot continue with speech therapy at this time.  She states that she has a return visit with Dr. Pryor Ochoa in February.   Speech Therapy Frequency --  Discharge per patient request   Duration Other (comment)  Discharge per patient request   Treatment/Interventions Patient/family education;Other (comment)  Voice therapy   Potential to Achieve Goals Good   Potential Considerations Ability to learn/carryover information;Cooperation/participation level;Previous level of function;Severity of impairments;Family/community support   SLP Home Exercise Plan Extrinsic laryngeal muscle strateches, breath support exercises; easy onset phonation; resonant therapy techniques   Consulted and Agree with Plan of Care Patient      Patient will benefit from skilled therapeutic intervention in order to improve the following deficits and impairments:   Dysphonia    Problem List There are no active problems to display for this patient.  [name], MS/CCC- SLP  Lou Miner 03/03/2016, 9:57 AM  [place] MAIN [facility] SERVICES [address] [place], [place], [address] Phone: [phone]   Fax:  [phone]   Name: Jacqueline Rhodes MRN: [id number] Date of Birth: [birth year]

## 2016-04-15 DIAGNOSIS — C73 Malignant neoplasm of thyroid gland: Secondary | ICD-10-CM | POA: Diagnosis not present

## 2016-04-15 DIAGNOSIS — R490 Dysphonia: Secondary | ICD-10-CM | POA: Diagnosis not present

## 2016-04-25 DIAGNOSIS — R509 Fever, unspecified: Secondary | ICD-10-CM | POA: Diagnosis not present

## 2016-04-25 DIAGNOSIS — R69 Illness, unspecified: Secondary | ICD-10-CM | POA: Diagnosis not present

## 2016-05-24 DIAGNOSIS — G4733 Obstructive sleep apnea (adult) (pediatric): Secondary | ICD-10-CM | POA: Diagnosis not present

## 2016-05-25 ENCOUNTER — Ambulatory Visit
Admission: RE | Admit: 2016-05-25 | Discharge: 2016-05-25 | Disposition: A | Discharge status: Home / Self Care | Payer: BLUE CROSS/BLUE SHIELD | Source: Ambulatory Visit | Attending: Specialist | Admitting: Specialist

## 2016-05-25 DIAGNOSIS — R918 Other nonspecific abnormal finding of lung field: Secondary | ICD-10-CM | POA: Diagnosis not present

## 2016-05-25 DIAGNOSIS — R911 Solitary pulmonary nodule: Secondary | ICD-10-CM | POA: Diagnosis present

## 2016-05-25 DIAGNOSIS — E279 Disorder of adrenal gland, unspecified: Secondary | ICD-10-CM | POA: Insufficient documentation

## 2016-05-25 DIAGNOSIS — I700 Atherosclerosis of aorta: Secondary | ICD-10-CM | POA: Insufficient documentation

## 2016-05-25 DIAGNOSIS — K449 Diaphragmatic hernia without obstruction or gangrene: Secondary | ICD-10-CM | POA: Diagnosis not present

## 2016-05-25 DIAGNOSIS — I2510 Atherosclerotic heart disease of native coronary artery without angina pectoris: Secondary | ICD-10-CM | POA: Diagnosis not present

## 2016-06-01 DIAGNOSIS — G4733 Obstructive sleep apnea (adult) (pediatric): Secondary | ICD-10-CM | POA: Diagnosis not present

## 2016-06-01 DIAGNOSIS — G4700 Insomnia, unspecified: Secondary | ICD-10-CM | POA: Diagnosis not present

## 2016-06-01 DIAGNOSIS — G4730 Sleep apnea, unspecified: Secondary | ICD-10-CM | POA: Diagnosis not present

## 2016-06-16 DIAGNOSIS — E039 Hypothyroidism, unspecified: Secondary | ICD-10-CM | POA: Diagnosis not present

## 2016-06-16 DIAGNOSIS — E119 Type 2 diabetes mellitus without complications: Secondary | ICD-10-CM | POA: Diagnosis not present

## 2016-06-16 DIAGNOSIS — I10 Essential (primary) hypertension: Secondary | ICD-10-CM | POA: Diagnosis not present

## 2016-06-16 DIAGNOSIS — R7303 Prediabetes: Secondary | ICD-10-CM | POA: Diagnosis not present

## 2016-06-16 DIAGNOSIS — E785 Hyperlipidemia, unspecified: Secondary | ICD-10-CM | POA: Diagnosis not present

## 2016-06-23 DIAGNOSIS — L409 Psoriasis, unspecified: Secondary | ICD-10-CM | POA: Diagnosis not present

## 2016-06-23 DIAGNOSIS — M150 Primary generalized (osteo)arthritis: Secondary | ICD-10-CM | POA: Diagnosis not present

## 2016-06-23 DIAGNOSIS — L4050 Arthropathic psoriasis, unspecified: Secondary | ICD-10-CM | POA: Diagnosis not present

## 2016-06-23 DIAGNOSIS — Z79899 Other long term (current) drug therapy: Secondary | ICD-10-CM | POA: Diagnosis not present

## 2016-07-05 DIAGNOSIS — M1711 Unilateral primary osteoarthritis, right knee: Secondary | ICD-10-CM | POA: Diagnosis not present

## 2016-07-05 DIAGNOSIS — M25561 Pain in right knee: Secondary | ICD-10-CM | POA: Diagnosis not present

## 2016-07-07 DIAGNOSIS — H26492 Other secondary cataract, left eye: Secondary | ICD-10-CM | POA: Diagnosis not present

## 2016-07-08 ENCOUNTER — Other Ambulatory Visit: Payer: Self-pay | Admitting: Physician Assistant

## 2016-07-08 DIAGNOSIS — I10 Essential (primary) hypertension: Secondary | ICD-10-CM | POA: Diagnosis not present

## 2016-07-08 DIAGNOSIS — M0579 Rheumatoid arthritis with rheumatoid factor of multiple sites without organ or systems involvement: Secondary | ICD-10-CM | POA: Diagnosis not present

## 2016-07-08 DIAGNOSIS — Z1231 Encounter for screening mammogram for malignant neoplasm of breast: Secondary | ICD-10-CM

## 2016-07-08 DIAGNOSIS — Z0000 Encounter for general adult medical examination without abnormal findings: Secondary | ICD-10-CM | POA: Diagnosis not present

## 2016-07-08 DIAGNOSIS — R05 Cough: Secondary | ICD-10-CM | POA: Diagnosis not present

## 2016-07-12 DIAGNOSIS — M25561 Pain in right knee: Secondary | ICD-10-CM | POA: Diagnosis not present

## 2016-07-12 DIAGNOSIS — L4050 Arthropathic psoriasis, unspecified: Secondary | ICD-10-CM | POA: Diagnosis not present

## 2016-07-26 DIAGNOSIS — E079 Disorder of thyroid, unspecified: Secondary | ICD-10-CM | POA: Diagnosis not present

## 2016-07-26 DIAGNOSIS — C73 Malignant neoplasm of thyroid gland: Secondary | ICD-10-CM | POA: Diagnosis not present

## 2016-07-26 DIAGNOSIS — E890 Postprocedural hypothyroidism: Secondary | ICD-10-CM | POA: Diagnosis not present

## 2016-08-04 ENCOUNTER — Ambulatory Visit
Admission: RE | Admit: 2016-08-04 | Discharge: 2016-08-04 | Disposition: A | Discharge status: Home / Self Care | Payer: BLUE CROSS/BLUE SHIELD | Source: Ambulatory Visit | Attending: Physician Assistant | Admitting: Physician Assistant

## 2016-08-04 DIAGNOSIS — Z1231 Encounter for screening mammogram for malignant neoplasm of breast: Secondary | ICD-10-CM | POA: Insufficient documentation

## 2016-08-10 DIAGNOSIS — H26492 Other secondary cataract, left eye: Secondary | ICD-10-CM | POA: Diagnosis not present

## 2016-08-23 DIAGNOSIS — I25111 Atherosclerotic heart disease of native coronary artery with angina pectoris with documented spasm: Secondary | ICD-10-CM | POA: Diagnosis not present

## 2016-08-23 DIAGNOSIS — G4733 Obstructive sleep apnea (adult) (pediatric): Secondary | ICD-10-CM | POA: Diagnosis not present

## 2016-08-23 DIAGNOSIS — I10 Essential (primary) hypertension: Secondary | ICD-10-CM | POA: Diagnosis not present

## 2016-08-23 DIAGNOSIS — I2510 Atherosclerotic heart disease of native coronary artery without angina pectoris: Secondary | ICD-10-CM | POA: Diagnosis not present

## 2016-08-25 DIAGNOSIS — G4733 Obstructive sleep apnea (adult) (pediatric): Secondary | ICD-10-CM | POA: Diagnosis not present

## 2016-08-31 DIAGNOSIS — R252 Cramp and spasm: Secondary | ICD-10-CM | POA: Diagnosis not present

## 2016-08-31 DIAGNOSIS — G4733 Obstructive sleep apnea (adult) (pediatric): Secondary | ICD-10-CM | POA: Diagnosis not present

## 2016-08-31 DIAGNOSIS — Z79899 Other long term (current) drug therapy: Secondary | ICD-10-CM | POA: Diagnosis not present

## 2016-08-31 DIAGNOSIS — L4050 Arthropathic psoriasis, unspecified: Secondary | ICD-10-CM | POA: Diagnosis not present

## 2016-08-31 DIAGNOSIS — R0609 Other forms of dyspnea: Secondary | ICD-10-CM | POA: Diagnosis not present

## 2016-09-06 DIAGNOSIS — M25561 Pain in right knee: Secondary | ICD-10-CM | POA: Diagnosis not present

## 2016-09-06 DIAGNOSIS — M150 Primary generalized (osteo)arthritis: Secondary | ICD-10-CM | POA: Diagnosis not present

## 2016-09-06 DIAGNOSIS — G8929 Other chronic pain: Secondary | ICD-10-CM | POA: Diagnosis not present

## 2016-09-06 DIAGNOSIS — L4050 Arthropathic psoriasis, unspecified: Secondary | ICD-10-CM | POA: Diagnosis not present

## 2016-10-06 DIAGNOSIS — L400 Psoriasis vulgaris: Secondary | ICD-10-CM | POA: Diagnosis not present

## 2016-11-03 DIAGNOSIS — E559 Vitamin D deficiency, unspecified: Secondary | ICD-10-CM | POA: Diagnosis not present

## 2016-11-03 DIAGNOSIS — E785 Hyperlipidemia, unspecified: Secondary | ICD-10-CM | POA: Diagnosis not present

## 2016-11-03 DIAGNOSIS — I10 Essential (primary) hypertension: Secondary | ICD-10-CM | POA: Diagnosis not present

## 2016-11-03 DIAGNOSIS — Z0000 Encounter for general adult medical examination without abnormal findings: Secondary | ICD-10-CM | POA: Diagnosis not present

## 2016-11-03 DIAGNOSIS — R739 Hyperglycemia, unspecified: Secondary | ICD-10-CM | POA: Diagnosis not present

## 2016-11-10 DIAGNOSIS — I2510 Atherosclerotic heart disease of native coronary artery without angina pectoris: Secondary | ICD-10-CM | POA: Diagnosis not present

## 2016-11-10 DIAGNOSIS — L409 Psoriasis, unspecified: Secondary | ICD-10-CM | POA: Diagnosis not present

## 2016-11-10 DIAGNOSIS — C73 Malignant neoplasm of thyroid gland: Secondary | ICD-10-CM | POA: Diagnosis not present

## 2016-11-10 DIAGNOSIS — E039 Hypothyroidism, unspecified: Secondary | ICD-10-CM | POA: Diagnosis not present

## 2016-11-10 DIAGNOSIS — E119 Type 2 diabetes mellitus without complications: Secondary | ICD-10-CM | POA: Diagnosis not present

## 2016-11-10 DIAGNOSIS — I10 Essential (primary) hypertension: Secondary | ICD-10-CM | POA: Diagnosis not present

## 2016-11-10 DIAGNOSIS — M0579 Rheumatoid arthritis with rheumatoid factor of multiple sites without organ or systems involvement: Secondary | ICD-10-CM | POA: Diagnosis not present

## 2016-11-10 DIAGNOSIS — L4050 Arthropathic psoriasis, unspecified: Secondary | ICD-10-CM | POA: Diagnosis not present

## 2016-11-10 DIAGNOSIS — E538 Deficiency of other specified B group vitamins: Secondary | ICD-10-CM | POA: Diagnosis not present

## 2016-11-24 DIAGNOSIS — L409 Psoriasis, unspecified: Secondary | ICD-10-CM | POA: Diagnosis not present

## 2016-11-24 DIAGNOSIS — L4050 Arthropathic psoriasis, unspecified: Secondary | ICD-10-CM | POA: Diagnosis not present

## 2016-11-24 DIAGNOSIS — M150 Primary generalized (osteo)arthritis: Secondary | ICD-10-CM | POA: Diagnosis not present

## 2016-11-24 DIAGNOSIS — L732 Hidradenitis suppurativa: Secondary | ICD-10-CM | POA: Diagnosis not present

## 2016-11-28 NOTE — Progress Notes (Signed)
11/29/2016 10:46 AM   Jacqueline Rhodes 1955-04-08 [id number]  Referring provider: Marinda Elk, MD [place] [facility] [place] [address]  Chief Complaint  Patient presents with  . New Patient (Initial Visit)    hematuria referred by Paulita Cradle    HPI: Patient is a 61 -year-old Caucasian female who presents today as a referral from their PCP, Dr. Paulita Cradle, for microscopic hematuria.    Patient was found to have microscopic hematuria on a few occasions with her most recent being on 11/08/2016 with > 50 RBC's/hpf.  Patient states that she has a remote history of gross hematuria, but she states no tests were performed.      She does not have a prior history of recurrent urinary tract infections, nephrolithiasis, trauma to the genitourinary tract or malignancies of the genitourinary tract.  She does not have a family medical history of nephrolithiasis, malignancies of the genitourinary tract or hematuria.   Today, she is having symptoms of frequent urination, urgency, nocturia and incontinence.  Her UA today demonstrates 3-10 RBC's. She not experiencing any suprapubic pain, abdominal pain or flank pain. She denies any recent fevers, chills, nausea or vomiting.   She has not had any recent imaging studies.   Sh is not a former smoker.   Quit in 2005.  She is not exposed to secondhand smoke.  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   She has HTN.  She has a high BMI.    PMH: Past Medical History:  Diagnosis Date  . Arthritis   . Collagen vascular disease ([place])   . Coronary artery disease   . Diabetes mellitus without complication ([place])   . Hypertension   . Thyroid cancer [facility])     Surgical History: Past Surgical History:  Procedure Laterality Date  . ABDOMINAL HYSTERECTOMY  2003  . BIOPSY THYROID  2017   x 2  . BREAST BIOPSY Left 2000   benign  . BREAST BIOPSY Right 2015   benign  .  CARDIAC SURGERY     2006, 2009  . CATARACT EXTRACTION, BILATERAL  2014  . left ankle surgery  2001  . left shoulder surgery  1996  . right breast biopsy  2015  . right elbow surgery  1999  . thyroid cancer surgery  09/05/2015  . VEIN SURGERY Bilateral 2015   legs    Home Medications:  Allergies as of 11/29/2016      Reactions   Estrogens Other (See Comments)   Blood clot   Codeine    Flexeril [cyclobenzaprine]    Penicillins    Terfenadine Hives   Vicodin [hydrocodone-acetaminophen] Hives      Medication List       Accurate as of 11/29/16 10:46 AM. Always use your most recent med list.          albuterol 108 (90 Base) MCG/ACT inhaler Commonly known as:  PROVENTIL HFA;VENTOLIN HFA Inhale into the lungs.   Apremilast 30 MG Tabs Take by mouth.   aspirin EC 81 MG tablet Take by mouth.   CALCIUM+D3 GRADUAL RELEASE 600-40-500 MG-MG-UNIT Tb24 Generic drug:  Calcium-Magnesium-Vitamin D Take by mouth.   cephALEXin 500 MG capsule Commonly known as:  KEFLEX Take 1 capsule (500 mg total) by mouth 4 (four) times daily.   Cetirizine HCl 10 MG Caps Take by mouth.   COQ-10 PO Take by mouth.   cyanocobalamin 1000 MCG/ML injection Commonly known as:  (VITAMIN B-12)  Inject into the muscle.   DEXILANT 60 MG capsule Generic drug:  dexlansoprazole Take by mouth.   folic acid 1 MG tablet Commonly known as:  FOLVITE Take 1 tablet every other day   hydrochlorothiazide 25 MG tablet Commonly known as:  HYDRODIURIL Take by mouth.   isosorbide mononitrate 60 MG 24 hr tablet Commonly known as:  IMDUR Take by mouth.   levothyroxine 75 MCG tablet Commonly known as:  SYNTHROID, LEVOTHROID Take by mouth.   losartan 100 MG tablet Commonly known as:  COZAAR Take by mouth.   methotrexate 2.5 MG tablet Commonly known as:  RHEUMATREX Take by mouth.   mupirocin ointment 2 % Commonly known as:  BACTROBAN Apply topically.   simvastatin 40 MG tablet Commonly known as:   ZOCOR Take by mouth.   traMADol 50 MG tablet Commonly known as:  ULTRAM Take by mouth.   triamcinolone ointment 0.1 % Commonly known as:  KENALOG Apply topically.   VITAMIN D-1000 MAX ST 1000 units tablet Generic drug:  Cholecalciferol Take by mouth.   zolpidem 5 MG tablet Commonly known as:  AMBIEN TAKE ONE TABLET BY MOUTH AT BEDTIME AS NEEDED SLEEP            Discharge Care Instructions        Start     Ordered   11/29/16 0000  Urinalysis, Complete     11/29/16 0942   11/29/16 0000  CULTURE, URINE COMPREHENSIVE     11/29/16 0942   11/29/16 0000  BUN+Creat     11/29/16 0942   11/29/16 0000  CT HEMATURIA WORKUP    Question Answer Comment  Reason for Exam (SYMPTOM  OR DIAGNOSIS REQUIRED) microscopic hematuria   Preferred imaging location? [facility]      11/29/16 1046      Allergies:  Allergies  Allergen Reactions  . Estrogens Other (See Comments)    Blood clot  . Codeine   . Flexeril [Cyclobenzaprine]   . Penicillins   . Terfenadine Hives  . Vicodin [Hydrocodone-Acetaminophen] Hives    Family History: Family History  Problem Relation Age of Onset  . Breast cancer Cousin        paternal side  . Breast cancer Paternal Aunt        2 PATs  . Breast cancer Maternal Aunt   . Kidney failure Sister   . Kidney cancer Neg Hx   . Bladder Cancer Neg Hx     Social History:  reports that she quit smoking about 13 years ago. She has never used smokeless tobacco. She reports that she does not drink alcohol or use drugs.  ROS: UROLOGY Frequent Urination?: Yes Hard to postpone urination?: Yes Burning/pain with urination?: No Get up at night to urinate?: Yes Leakage of urine?: Yes Urine stream starts and stops?: No Trouble starting stream?: No Do you have to strain to urinate?: No Blood in urine?: Yes Urinary tract infection?: No Sexually transmitted disease?: No Injury to kidneys or bladder?: No Painful intercourse?: No Weak stream?:  No Currently pregnant?: No Vaginal bleeding?: No Last menstrual period?: n  Gastrointestinal Nausea?: No Vomiting?: No Indigestion/heartburn?: Yes Diarrhea?: No Constipation?: Yes  Constitutional Fever: No Night sweats?: No Weight loss?: No Fatigue?: Yes  Skin Skin rash/lesions?: Yes Itching?: Yes  Eyes Blurred vision?: No Double vision?: No  Ears/Nose/Throat Sore throat?: No Sinus problems?: Yes  Hematologic/Lymphatic Swollen glands?: No Easy bruising?: Yes  Cardiovascular Leg swelling?: Yes Chest pain?: No  Respiratory Cough?: No Shortness of breath?:  Yes  Endocrine Excessive thirst?: Yes  Musculoskeletal Back pain?: Yes Joint pain?: Yes  Neurological Headaches?: No Dizziness?: No  Psychologic Depression?: No Anxiety?: No  Physical Exam: BP 137/78   Pulse 87   Ht 5\' 4"  (1.626 m)   Wt 268 lb 12.8 oz (121.9 kg)   BMI 46.14 kg/m   Constitutional: Well nourished. Alert and oriented, No acute distress. HEENT: [place] AT, moist mucus membranes. Trachea midline, no masses. Cardiovascular: No clubbing, cyanosis, or edema. Respiratory: Normal respiratory effort, no increased work of breathing. GI: Abdomen is soft, non tender, non distended, no abdominal masses.  GU: No CVA tenderness.  No bladder fullness or masses.   Skin: No rashes, bruises or suspicious lesions. Lymph: No cervical or inguinal adenopathy. Neurologic: Grossly intact, no focal deficits, moving all 4 extremities. Psychiatric: Normal mood and affect.  Laboratory Data: Urinalysis 3-10 RBC's.  See EPIC.    I have reviewed the labs   Assessment & Plan:    1. Microscopic hematuria  - I explained to the patient that there are a number of causes that can be associated with blood in the urine, such as stones, UTI's, damage to the urinary tract and/or cancer.  - At this time, I felt that the patient warranted further urologic evaluation.   The AUA guidelines state that a CT urogram is  the preferred imaging study to evaluate hematuria.  - I explained to the patient that a contrast material will be injected into a vein and that in rare instances, an allergic reaction can result and may even life threatening   The patient denies any allergies to contrast, iodine and/or seafood and is not taking metformin.  - Her reproductive status is hysterectomy  - Following the imaging study,  I've recommended a cystoscopy. I described how this is performed, typically in an office setting with a flexible cystoscope. We described the risks, benefits, and possible side effects, the most common of which is a minor amount of blood in the urine and/or burning which usually resolves in 24 to 48 hours.    - The patient had the opportunity to ask questions which were answered. Based upon this discussion, the patient is willing to proceed. Therefore, I've ordered: a CT Urogram and cystoscopy.  - The patient will return following all of the above for discussion of the results.   - UA  - Urine culture  - BUN + creatinine      Return for CT Urogram report and cystoscopy.  These notes generated with voice recognition software. I apologize for typographical errors.  Zara Council, [facility] [address] [place],  [address] [phone]

## 2016-11-29 ENCOUNTER — Ambulatory Visit (INDEPENDENT_AMBULATORY_CARE_PROVIDER_SITE_OTHER): Payer: BLUE CROSS/BLUE SHIELD | Admitting: Urology

## 2016-11-29 ENCOUNTER — Telehealth: Payer: Self-pay | Admitting: Urology

## 2016-11-29 ENCOUNTER — Encounter: Payer: Self-pay | Admitting: Urology

## 2016-11-29 VITALS — BP 137/78 | HR 87 | Ht 64.0 in | Wt 268.8 lb

## 2016-11-29 DIAGNOSIS — R3129 Other microscopic hematuria: Secondary | ICD-10-CM | POA: Diagnosis not present

## 2016-11-29 DIAGNOSIS — L4050 Arthropathic psoriasis, unspecified: Secondary | ICD-10-CM | POA: Diagnosis not present

## 2016-11-29 DIAGNOSIS — Z79899 Other long term (current) drug therapy: Secondary | ICD-10-CM | POA: Diagnosis not present

## 2016-11-29 LAB — URINALYSIS, COMPLETE
Bilirubin, UA: NEGATIVE
GLUCOSE, UA: NEGATIVE
KETONES UA: NEGATIVE
Leukocytes, UA: NEGATIVE
NITRITE UA: NEGATIVE
Protein, UA: NEGATIVE
SPEC GRAV UA: 1.015 (ref 1.005–1.030)
UUROB: 1 mg/dL (ref 0.2–1.0)
pH, UA: 7.5 (ref 5.0–7.5)

## 2016-11-29 LAB — MICROSCOPIC EXAMINATION: WBC UA: NONE SEEN /HPF (ref 0–?)

## 2016-11-29 NOTE — Telephone Encounter (Signed)
Note faxed.

## 2016-11-29 NOTE — Telephone Encounter (Signed)
Would you send my note to Boykin Reaper, PA?

## 2016-11-30 LAB — BUN+CREAT
BUN/Creatinine Ratio: 20 (ref 12–28)
BUN: 18 mg/dL (ref 8–27)
Creatinine, Ser: 0.89 mg/dL (ref 0.57–1.00)
GFR calc Af Amer: 80 mL/min/1.73
GFR calc non Af Amer: 69 mL/min/1.73

## 2016-12-02 LAB — CULTURE, URINE COMPREHENSIVE

## 2016-12-15 ENCOUNTER — Ambulatory Visit
Admission: RE | Admit: 2016-12-15 | Discharge: 2016-12-15 | Disposition: A | Discharge status: Home / Self Care | Payer: BLUE CROSS/BLUE SHIELD | Source: Ambulatory Visit | Attending: Urology | Admitting: Urology

## 2016-12-15 DIAGNOSIS — I2510 Atherosclerotic heart disease of native coronary artery without angina pectoris: Secondary | ICD-10-CM | POA: Insufficient documentation

## 2016-12-15 DIAGNOSIS — I517 Cardiomegaly: Secondary | ICD-10-CM | POA: Insufficient documentation

## 2016-12-15 DIAGNOSIS — K760 Fatty (change of) liver, not elsewhere classified: Secondary | ICD-10-CM | POA: Diagnosis not present

## 2016-12-15 DIAGNOSIS — K5730 Diverticulosis of large intestine without perforation or abscess without bleeding: Secondary | ICD-10-CM | POA: Diagnosis not present

## 2016-12-15 DIAGNOSIS — I700 Atherosclerosis of aorta: Secondary | ICD-10-CM | POA: Insufficient documentation

## 2016-12-15 DIAGNOSIS — R3129 Other microscopic hematuria: Secondary | ICD-10-CM | POA: Diagnosis not present

## 2016-12-15 DIAGNOSIS — K449 Diaphragmatic hernia without obstruction or gangrene: Secondary | ICD-10-CM | POA: Insufficient documentation

## 2016-12-15 DIAGNOSIS — N281 Cyst of kidney, acquired: Secondary | ICD-10-CM | POA: Insufficient documentation

## 2016-12-15 MED ORDER — IOPAMIDOL (ISOVUE-300) INJECTION 61%
125.0000 mL | Freq: Once | INTRAVENOUS | Status: AC | PRN
  Administered 2016-12-15: 125 mL via INTRAVENOUS

## 2016-12-29 ENCOUNTER — Ambulatory Visit: Payer: BLUE CROSS/BLUE SHIELD | Admitting: Urology

## 2016-12-29 ENCOUNTER — Encounter: Payer: Self-pay | Admitting: Urology

## 2016-12-29 VITALS — BP 147/77 | HR 82 | Ht 64.0 in | Wt 269.2 lb

## 2016-12-29 DIAGNOSIS — R3129 Other microscopic hematuria: Secondary | ICD-10-CM

## 2016-12-29 LAB — URINALYSIS, COMPLETE
BILIRUBIN UA: NEGATIVE
Glucose, UA: NEGATIVE
KETONES UA: NEGATIVE
LEUKOCYTES UA: NEGATIVE
Nitrite, UA: NEGATIVE
PROTEIN UA: NEGATIVE
SPEC GRAV UA: 1.015 (ref 1.005–1.030)
UUROB: 1 mg/dL (ref 0.2–1.0)
pH, UA: 7 (ref 5.0–7.5)

## 2016-12-29 LAB — MICROSCOPIC EXAMINATION
Bacteria, UA: NONE SEEN
WBC UA: NONE SEEN /HPF (ref 0–?)

## 2016-12-29 MED ORDER — LIDOCAINE HCL 2 % EX GEL
1.0000 "application " | Freq: Once | CUTANEOUS | Status: AC
  Administered 2016-12-29: 1 via URETHRAL

## 2016-12-29 MED ORDER — CIPROFLOXACIN HCL 500 MG PO TABS
500.0000 mg | ORAL_TABLET | Freq: Once | ORAL | Status: AC
  Administered 2016-12-29: 500 mg via ORAL

## 2016-12-29 NOTE — Progress Notes (Signed)
   12/29/16  CC:  Chief Complaint  Patient presents with  . Cysto    HPI: The patient is a 61 year old female presents today for completion of her microscopic hematuria workup. Her CT urogram was negative for concerning potential cause of microscopic hematuria. She did have an 8.7 cm benign Bosniak 2 right renal cyst.  Blood pressure (!) 147/77, pulse 82, height 5\' 4"  (1.626 m), weight 269 lb 3.2 oz (122.1 kg). NED. A&Ox3.   No respiratory distress   Abd soft, NT, ND Normal external genitalia with patent urethral meatus  Cystoscopy Procedure Note  Patient identification was confirmed, informed consent was obtained, and patient was prepped using Betadine solution.  Lidocaine jelly was administered per urethral meatus.    Preoperative abx where received prior to procedure.    Procedure: - Flexible cystoscope introduced, without any difficulty.   - Thorough search of the bladder revealed:    normal urethral meatus    normal urothelium    no stones    no ulcers     no tumors    no urethral polyps    no trabeculation  - Ureteral orifices were normal in position and appearance.  Post-Procedure: - Patient tolerated the procedure well  Assessment/ Plan:  1. Microscopic hematuria -negative workup. Follow-up in one year for repeat urinalysis.  2. Bosniak 2 right renal cyst This is a benign lesion. No further workup is warranted.   Nickie Retort, MD

## 2017-01-12 DIAGNOSIS — L4050 Arthropathic psoriasis, unspecified: Secondary | ICD-10-CM | POA: Diagnosis not present

## 2017-01-27 DIAGNOSIS — J310 Chronic rhinitis: Secondary | ICD-10-CM | POA: Diagnosis not present

## 2017-01-27 DIAGNOSIS — J449 Chronic obstructive pulmonary disease, unspecified: Secondary | ICD-10-CM | POA: Diagnosis not present

## 2017-01-27 DIAGNOSIS — R0609 Other forms of dyspnea: Secondary | ICD-10-CM | POA: Diagnosis not present

## 2017-02-14 DIAGNOSIS — E782 Mixed hyperlipidemia: Secondary | ICD-10-CM | POA: Diagnosis not present

## 2017-02-14 DIAGNOSIS — I10 Essential (primary) hypertension: Secondary | ICD-10-CM | POA: Diagnosis not present

## 2017-02-14 DIAGNOSIS — I25118 Atherosclerotic heart disease of native coronary artery with other forms of angina pectoris: Secondary | ICD-10-CM | POA: Diagnosis not present

## 2017-02-24 DIAGNOSIS — C73 Malignant neoplasm of thyroid gland: Secondary | ICD-10-CM | POA: Diagnosis not present

## 2017-02-24 DIAGNOSIS — Z9889 Other specified postprocedural states: Secondary | ICD-10-CM | POA: Diagnosis not present

## 2017-02-24 DIAGNOSIS — E079 Disorder of thyroid, unspecified: Secondary | ICD-10-CM | POA: Diagnosis not present

## 2017-03-01 DIAGNOSIS — C73 Malignant neoplasm of thyroid gland: Secondary | ICD-10-CM | POA: Diagnosis not present

## 2017-03-02 DIAGNOSIS — I25118 Atherosclerotic heart disease of native coronary artery with other forms of angina pectoris: Secondary | ICD-10-CM | POA: Diagnosis not present

## 2017-03-08 DIAGNOSIS — I10 Essential (primary) hypertension: Secondary | ICD-10-CM | POA: Diagnosis not present

## 2017-03-08 DIAGNOSIS — I25118 Atherosclerotic heart disease of native coronary artery with other forms of angina pectoris: Secondary | ICD-10-CM | POA: Diagnosis not present

## 2017-03-08 DIAGNOSIS — E782 Mixed hyperlipidemia: Secondary | ICD-10-CM | POA: Diagnosis not present

## 2017-03-08 DIAGNOSIS — J019 Acute sinusitis, unspecified: Secondary | ICD-10-CM | POA: Diagnosis not present

## 2017-03-23 DIAGNOSIS — R601 Generalized edema: Secondary | ICD-10-CM | POA: Diagnosis not present

## 2017-03-23 DIAGNOSIS — E119 Type 2 diabetes mellitus without complications: Secondary | ICD-10-CM | POA: Diagnosis not present

## 2017-03-23 DIAGNOSIS — L4050 Arthropathic psoriasis, unspecified: Secondary | ICD-10-CM | POA: Diagnosis not present

## 2017-03-23 DIAGNOSIS — L409 Psoriasis, unspecified: Secondary | ICD-10-CM | POA: Diagnosis not present

## 2017-03-23 DIAGNOSIS — I10 Essential (primary) hypertension: Secondary | ICD-10-CM | POA: Diagnosis not present

## 2017-03-23 DIAGNOSIS — M778 Other enthesopathies, not elsewhere classified: Secondary | ICD-10-CM | POA: Diagnosis not present

## 2017-03-23 DIAGNOSIS — E782 Mixed hyperlipidemia: Secondary | ICD-10-CM | POA: Diagnosis not present

## 2017-04-09 DIAGNOSIS — M25511 Pain in right shoulder: Secondary | ICD-10-CM | POA: Diagnosis not present

## 2017-05-03 DIAGNOSIS — G4762 Sleep related leg cramps: Secondary | ICD-10-CM | POA: Diagnosis not present

## 2017-05-03 DIAGNOSIS — R0609 Other forms of dyspnea: Secondary | ICD-10-CM | POA: Diagnosis not present

## 2017-05-03 DIAGNOSIS — G4733 Obstructive sleep apnea (adult) (pediatric): Secondary | ICD-10-CM | POA: Diagnosis not present

## 2017-05-18 DIAGNOSIS — G4733 Obstructive sleep apnea (adult) (pediatric): Secondary | ICD-10-CM | POA: Diagnosis not present

## 2017-05-18 DIAGNOSIS — L409 Psoriasis, unspecified: Secondary | ICD-10-CM | POA: Diagnosis not present

## 2017-05-18 DIAGNOSIS — L4050 Arthropathic psoriasis, unspecified: Secondary | ICD-10-CM | POA: Diagnosis not present

## 2017-05-18 DIAGNOSIS — M25561 Pain in right knee: Secondary | ICD-10-CM | POA: Diagnosis not present

## 2017-05-18 DIAGNOSIS — I10 Essential (primary) hypertension: Secondary | ICD-10-CM | POA: Diagnosis not present

## 2017-05-18 DIAGNOSIS — R601 Generalized edema: Secondary | ICD-10-CM | POA: Diagnosis not present

## 2017-05-18 DIAGNOSIS — I25118 Atherosclerotic heart disease of native coronary artery with other forms of angina pectoris: Secondary | ICD-10-CM | POA: Diagnosis not present

## 2017-05-18 DIAGNOSIS — E119 Type 2 diabetes mellitus without complications: Secondary | ICD-10-CM | POA: Diagnosis not present

## 2017-06-16 ENCOUNTER — Encounter (INDEPENDENT_AMBULATORY_CARE_PROVIDER_SITE_OTHER): Payer: Self-pay | Admitting: Vascular Surgery

## 2017-06-16 ENCOUNTER — Ambulatory Visit (INDEPENDENT_AMBULATORY_CARE_PROVIDER_SITE_OTHER): Payer: BLUE CROSS/BLUE SHIELD | Admitting: Vascular Surgery

## 2017-06-16 VITALS — BP 147/72 | HR 78 | Resp 16 | Ht 64.0 in | Wt 281.4 lb

## 2017-06-16 DIAGNOSIS — M79604 Pain in right leg: Secondary | ICD-10-CM | POA: Diagnosis not present

## 2017-06-16 DIAGNOSIS — M79609 Pain in unspecified limb: Secondary | ICD-10-CM | POA: Insufficient documentation

## 2017-06-16 DIAGNOSIS — E119 Type 2 diabetes mellitus without complications: Secondary | ICD-10-CM | POA: Diagnosis not present

## 2017-06-16 DIAGNOSIS — I10 Essential (primary) hypertension: Secondary | ICD-10-CM | POA: Diagnosis not present

## 2017-06-16 DIAGNOSIS — M79605 Pain in left leg: Secondary | ICD-10-CM

## 2017-06-16 DIAGNOSIS — M7989 Other specified soft tissue disorders: Secondary | ICD-10-CM

## 2017-06-16 NOTE — Assessment & Plan Note (Signed)
blood pressure control important in reducing the progression of atherosclerotic disease. On appropriate oral medications.  

## 2017-06-16 NOTE — Assessment & Plan Note (Signed)
Likely multifactorial.  Venous workup as below

## 2017-06-16 NOTE — Patient Instructions (Signed)

## 2017-06-16 NOTE — Assessment & Plan Note (Signed)
blood glucose control important in reducing the progression of atherosclerotic disease. Also, involved in wound healing. On appropriate medications. Potentially neuropathy could be contributing to lower extremity pain?

## 2017-06-16 NOTE — Assessment & Plan Note (Signed)
The patient has a previous history of venous insufficiency and has undergone treatment in the past.  She is morbidly obese which is certainly worsening her lower extremity pain and swelling.  Given her recurrent pain and swelling, I think a venous reflux study at her convenience would be prudent.  This will be done and I will see her back following the study to discuss the results and determine further treatment options.  We discussed that a lymphedema pump might be a good adjuvant therapy.  It is possible that there would be further venous intervention that would be of benefit, but this may be more of a lymphedema and chronic swelling situation.

## 2017-06-16 NOTE — Progress Notes (Signed)
Patient ID: Jacqueline Rhodes, female   DOB: Nov 12, 1955, 62 y.o.   MRN: [id number]  Chief Complaint  Patient presents with  . New Patient (Initial Visit)    bil leg swelling and pain    HPI Jacqueline Rhodes is a 62 y.o. female.  Patient present for evaluation of bilateral lower extremity pain and swelling.  She was last seen and had venous procedures done over 3 years ago.  Over the last 3-4 years, she has developed worsening pain and swelling in her legs.  She has developed significant stasis dermatitis in the right lower leg just above the ankle where the area has become firm and indurated.  She denies any new open ulceration or infection.  No fevers or chills.  Both legs are quite swollen.  She stands on her feet much of the day at work.  She has not really been able to tolerate compression stockings so she does not wear them regularly.  She says they hurt more.  Current Outpatient Medications  Medication Sig Dispense Refill  . albuterol (PROVENTIL HFA;VENTOLIN HFA) 108 (90 Base) MCG/ACT inhaler Inhale into the lungs.    Marland Kitchen amLODipine (NORVASC) 10 MG tablet Take 10 mg by mouth daily.  11  . aspirin EC 81 MG tablet Take by mouth.    . Calcium-Magnesium-Vitamin D (CALCIUM+D3 GRADUAL RELEASE) 600-40-500 MG-MG-UNIT TB24 Take by mouth.    . Cetirizine HCl 10 MG CAPS Take by mouth.    . Cholecalciferol (VITAMIN D-1000 MAX ST) 1000 units tablet Take by mouth.    . Coenzyme Q10 (COQ-10 PO) Take by mouth.    . cyanocobalamin (,VITAMIN B-12,) 1000 MCG/ML injection Inject into the muscle.    Marland Kitchen dexlansoprazole (DEXILANT) 60 MG capsule Take by mouth.    . fluticasone furoate-vilanterol (BREO ELLIPTA) 100-25 MCG/INH AEPB Inhale into the lungs.    . folic acid (FOLVITE) 1 MG tablet Take 1 tablet every other day    . hydrochlorothiazide (HYDRODIURIL) 25 MG tablet Take by mouth.    . isosorbide mononitrate (IMDUR) 60 MG 24 hr tablet Take by mouth.    . levothyroxine (SYNTHROID, LEVOTHROID) 75 MCG  tablet Take by mouth.    . losartan (COZAAR) 100 MG tablet Take by mouth.    . methotrexate (RHEUMATREX) 2.5 MG tablet Take by mouth.    . mupirocin ointment (BACTROBAN) 2 % Apply topically.    . simvastatin (ZOCOR) 40 MG tablet Take by mouth.    . triamcinolone ointment (KENALOG) 0.1 % Apply topically.    Marland Kitchen Apremilast 30 MG TABS Take by mouth.    . traMADol (ULTRAM) 50 MG tablet Take by mouth.    . zolpidem (AMBIEN) 5 MG tablet TAKE ONE TABLET BY MOUTH AT BEDTIME AS NEEDED SLEEP     No current facility-administered medications for this visit.      Past Medical History:  Diagnosis Date  . Arthritis   . Collagen vascular disease ([place])   . Coronary artery disease   . Diabetes mellitus without complication ([place])   . Hypertension   . Thyroid cancer [place])     Past Surgical History:  Procedure Laterality Date  . ABDOMINAL HYSTERECTOMY  2003  . BIOPSY THYROID  2017   x 2  . BREAST BIOPSY Left 2000   benign  . BREAST BIOPSY Right 2015   benign  . CARDIAC SURGERY     2006, 2009  . CATARACT EXTRACTION, BILATERAL  2014  . left ankle surgery  2001  .  left shoulder surgery  1996  . right breast biopsy  2015  . right elbow surgery  1999  . thyroid cancer surgery  09/05/2015  . VEIN SURGERY Bilateral 2015   legs    Family History  Problem Relation Age of Onset  . Breast cancer Cousin        paternal side  . Breast cancer Paternal Aunt        2 PATs  . Breast cancer Maternal Aunt   . Kidney failure Sister   . Kidney cancer Neg Hx   . Bladder Cancer Neg Hx      Social History Social History   Tobacco Use  . Smoking status: Former Smoker    Last attempt to quit: 2005    Years since quitting: 14.2  . Smokeless tobacco: Never Used  Substance Use Topics  . Alcohol use: No  . Drug use: No     Allergies  Allergen Reactions  . Estrogens Other (See Comments)    Blood clot  . Codeine   . Flexeril [Cyclobenzaprine]   . Penicillins   . Terfenadine Hives  . Vicodin  [Hydrocodone-Acetaminophen] Hives        REVIEW OF SYSTEMS (Negative unless checked)  Constitutional: [] Weight loss  [] Fever  [] Chills Cardiac: [] Chest pain   [] Chest pressure   [] Palpitations   [] Shortness of breath when laying flat   [] Shortness of breath at rest   [] Shortness of breath with exertion. Vascular:  [x] Pain in legs with walking   [x] Pain in legs at rest   [] Pain in legs when laying flat   [] Claudication   [] Pain in feet when walking  [] Pain in feet at rest  [] Pain in feet when laying flat   [] History of DVT   [] Phlebitis   [x] Swelling in legs   [] Varicose veins   [] Non-healing ulcers Pulmonary:   [] Uses home oxygen   [] Productive cough   [] Hemoptysis   [] Wheeze  [] COPD   [] Asthma Neurologic:  [] Dizziness  [] Blackouts   [] Seizures   [] History of stroke   [] History of TIA  [] Aphasia   [] Temporary blindness   [] Dysphagia   [] Weakness or numbness in arms   [] Weakness or numbness in legs Musculoskeletal:  [x] Arthritis   [] Joint swelling   [x] Joint pain   [] Low back pain Hematologic:  [] Easy bruising  [] Easy bleeding   [] Hypercoagulable state   [] Anemic  [] Hepatitis  Gastrointestinal:  [] Blood in stool   [] Vomiting blood  [x] Gastroesophageal reflux/heartburn   [] Difficulty swallowing   Genitourinary:  [] Chronic kidney disease   [] Difficult urination  [] Frequent urination  [] Burning with urination   [] Blood in urine Skin:  [] Rashes   [] Ulcers   [] Wounds Psychological:  [] History of anxiety   []  History of major depression.  Physical Exam BP (!) 147/72 (BP Location: Right Arm)   Pulse 78   Resp 16   Ht 5\' 4"  (1.626 m)   Wt 127.6 kg (281 lb 6.4 oz)   BMI 48.30 kg/m   Gen:  WD/WN, NAD.  Morbidly obese Head: [place]/AT, No temporalis wasting.  Ear/Nose/Throat: Hearing grossly intact, nares w/o erythema or drainage, oropharynx w/o Erythema/Exudate Eyes: Sclera non-icteric, conjunctiva clear Neck: Trachea midline.  Trachea midline Pulmonary:  Good air movement, no use of accessory  muscles.  Cardiac: RRR, no JVD Vascular:  Vessel Right Left  Musculoskeletal: M/S 5/5 throughout.  Extremities without ischemic changes.  No deformity or atrophy.  2+ bilateral lower extremity edema. Varicosities scattered throughout.  Moderate stasis dermatitis on the right and only mild on the left. Neurologic:  Sensation grossly intact in extremities.  Symmetrical.  Speech is fluent. Motor exam as listed above. Psychiatric: Judgment intact, Mood & affect appropriate for pt's clinical situation. Dermatologic: No rashes or ulcers noted.  No cellulitis or open wounds.  Radiology No results found.  Labs No results found for this or any previous visit (from the past 2160 hour(s)).  Assessment/Plan:  Diabetes mellitus without complication (HCC) blood glucose control important in reducing the progression of atherosclerotic disease. Also, involved in wound healing. On appropriate medications. Potentially neuropathy could be contributing to lower extremity pain?  Hypertension blood pressure control important in reducing the progression of atherosclerotic disease. On appropriate oral medications.   Pain in limb Likely multifactorial.  Venous workup as below  Swelling of limb The patient has a previous history of venous insufficiency and has undergone treatment in the past.  She is morbidly obese which is certainly worsening her lower extremity pain and swelling.  Given her recurrent pain and swelling, I think a venous reflux study at her convenience would be prudent.  This will be done and I will see her back following the study to discuss the results and determine further treatment options.  We discussed that a lymphedema pump might be a good adjuvant therapy.  It is possible that there would be further venous intervention that would be of benefit, but this may be more of a lymphedema and chronic swelling situation.     Leotis Pain 06/16/2017,  11:12 AM   This note was created with [facility] medical dictation system.  Any errors from dictation are unintentional.

## 2017-07-14 DIAGNOSIS — L400 Psoriasis vulgaris: Secondary | ICD-10-CM | POA: Diagnosis not present

## 2017-07-14 DIAGNOSIS — L02412 Cutaneous abscess of left axilla: Secondary | ICD-10-CM | POA: Diagnosis not present

## 2017-07-20 DIAGNOSIS — E782 Mixed hyperlipidemia: Secondary | ICD-10-CM | POA: Diagnosis not present

## 2017-07-20 DIAGNOSIS — E538 Deficiency of other specified B group vitamins: Secondary | ICD-10-CM | POA: Diagnosis not present

## 2017-07-20 DIAGNOSIS — M150 Primary generalized (osteo)arthritis: Secondary | ICD-10-CM | POA: Diagnosis not present

## 2017-07-20 DIAGNOSIS — E039 Hypothyroidism, unspecified: Secondary | ICD-10-CM | POA: Diagnosis not present

## 2017-07-20 DIAGNOSIS — Z6841 Body Mass Index (BMI) 40.0 and over, adult: Secondary | ICD-10-CM | POA: Diagnosis not present

## 2017-07-20 DIAGNOSIS — I10 Essential (primary) hypertension: Secondary | ICD-10-CM | POA: Diagnosis not present

## 2017-07-20 DIAGNOSIS — E119 Type 2 diabetes mellitus without complications: Secondary | ICD-10-CM | POA: Diagnosis not present

## 2017-07-26 ENCOUNTER — Ambulatory Visit (INDEPENDENT_AMBULATORY_CARE_PROVIDER_SITE_OTHER): Payer: BLUE CROSS/BLUE SHIELD

## 2017-07-26 ENCOUNTER — Encounter (INDEPENDENT_AMBULATORY_CARE_PROVIDER_SITE_OTHER): Payer: Self-pay | Admitting: Vascular Surgery

## 2017-07-26 ENCOUNTER — Ambulatory Visit (INDEPENDENT_AMBULATORY_CARE_PROVIDER_SITE_OTHER): Payer: BLUE CROSS/BLUE SHIELD | Admitting: Vascular Surgery

## 2017-07-26 VITALS — BP 133/75 | HR 80 | Resp 18 | Ht 64.0 in | Wt 279.0 lb

## 2017-07-26 DIAGNOSIS — I890 Lymphedema, not elsewhere classified: Secondary | ICD-10-CM

## 2017-07-26 DIAGNOSIS — M7989 Other specified soft tissue disorders: Secondary | ICD-10-CM | POA: Diagnosis not present

## 2017-07-26 DIAGNOSIS — I872 Venous insufficiency (chronic) (peripheral): Secondary | ICD-10-CM | POA: Diagnosis not present

## 2017-07-27 ENCOUNTER — Encounter (INDEPENDENT_AMBULATORY_CARE_PROVIDER_SITE_OTHER): Payer: Self-pay | Admitting: Vascular Surgery

## 2017-07-27 DIAGNOSIS — I872 Venous insufficiency (chronic) (peripheral): Secondary | ICD-10-CM | POA: Insufficient documentation

## 2017-07-27 DIAGNOSIS — I890 Lymphedema, not elsewhere classified: Secondary | ICD-10-CM | POA: Insufficient documentation

## 2017-07-27 NOTE — Progress Notes (Signed)
Subjective:    Patient ID: Jacqueline Rhodes, female    DOB: 09/03/1955, 62 y.o.   MRN: [id number] Chief Complaint  Patient presents with  . Follow-up    Bilateral venous reflux    Patient presents to review vascular studies.  The patient was last seen on June 16, 2017 for evaluation of recurrent bilateral lower extremity edema.  The patient does have a previous history of endovenous laser ablation to the left great saphenous vein on March 02, 2014 in the right small saphenous vein on March 11, 2014.  Since her previous visit, the patient has been engaging in conservative therapy including wearing medical grade 1 compression socks, elevating her legs and remaining active with minimal improvement to the edema and discomfort noted to her bilateral lower extremity.  The patient notes her symptoms have progressed to the point that she is unable to function on a daily basis.  The patient does stand for approximately 10 to 12 hours a day for employment.  The patient notes that her symptoms have progressed to the lifestyle limiting.  The patient underwent a bilateral lower extremity venous reflux which was notable for no deep or superficial vein reflux.  No deep vein thrombosis or superficial venous thrombosis noted.  Moderate interstitial edema was noted to the bilateral lower extremity.  At this time, the patient denies any ulcer formation.  The patient denies any fever, nausea vomiting.  Review of Systems  Constitutional: Negative.   HENT: Negative.   Eyes: Negative.   Respiratory: Negative.   Cardiovascular: Positive for leg swelling.  Gastrointestinal: Negative.   Endocrine: Negative.   Genitourinary: Negative.   Musculoskeletal: Negative.   Skin: Negative.   Allergic/Immunologic: Negative.   Neurological: Negative.   Hematological: Negative.   Psychiatric/Behavioral: Negative.       Objective:   Physical Exam  Constitutional: She is oriented to person, place, and time. She appears  well-developed and well-nourished. No distress.  HENT:  Head: Normocephalic and atraumatic.  Right Ear: External ear normal.  Left Ear: External ear normal.  Eyes: Pupils are equal, round, and reactive to light. Conjunctivae and EOM are normal.  Neck: Normal range of motion.  Cardiovascular: Normal rate, regular rhythm, normal heart sounds and intact distal pulses.  Pulses:      Radial pulses are 2+ on the right side, and 2+ on the left side.  Unable to palpate pedal pulses due to body habitus and edema however the bilateral feet are warm  Pulmonary/Chest: Effort normal and breath sounds normal.  Musculoskeletal: Normal range of motion. She exhibits edema (Moderate 2+ pitting edema noted).  Neurological: She is alert and oriented to person, place, and time.  Skin: Skin is warm and dry. She is not diaphoretic.  Psychiatric: She has a normal mood and affect. Her behavior is normal. Judgment and thought content normal.   BP 133/75 (BP Location: Right Arm, Patient Position: Sitting)   Pulse 80   Resp 18   Ht 5\' 4"  (1.626 m)   Wt 279 lb (126.6 kg)   BMI 47.89 kg/m   Past Medical History:  Diagnosis Date  . Arthritis   . Collagen vascular disease ([place])   . Coronary artery disease   . Diabetes mellitus without complication ([place])   . Hypertension   . Thyroid cancer [facility])    Social History   Socioeconomic History  . Marital status: Married    Spouse name: Not on file  . Number of children: Not on file  .  Years of education: Not on file  . Highest education level: Not on file  Occupational History  . Not on file  Social Needs  . Financial resource strain: Not on file  . Food insecurity:    Worry: Not on file    Inability: Not on file  . Transportation needs:    Medical: Not on file    Non-medical: Not on file  Tobacco Use  . Smoking status: Former Smoker    Last attempt to quit: 2005    Years since quitting: 14.3  . Smokeless tobacco: Never Used  Substance and Sexual  Activity  . Alcohol use: No  . Drug use: No  . Sexual activity: Not on file  Lifestyle  . Physical activity:    Days per week: Not on file    Minutes per session: Not on file  . Stress: Not on file  Relationships  . Social connections:    Talks on phone: Not on file    Gets together: Not on file    Attends religious service: Not on file    Active member of club or organization: Not on file    Attends meetings of clubs or organizations: Not on file    Relationship status: Not on file  . Intimate partner violence:    Fear of current or ex partner: Not on file    Emotionally abused: Not on file    Physically abused: Not on file    Forced sexual activity: Not on file  Other Topics Concern  . Not on file  Social History Narrative  . Not on file   Past Surgical History:  Procedure Laterality Date  . ABDOMINAL HYSTERECTOMY  2003  . BIOPSY THYROID  2017   x 2  . BREAST BIOPSY Left 2000   benign  . BREAST BIOPSY Right 2015   benign  . CARDIAC SURGERY     2006, 2009  . CATARACT EXTRACTION, BILATERAL  2014  . left ankle surgery  2001  . left shoulder surgery  1996  . right breast biopsy  2015  . right elbow surgery  1999  . thyroid cancer surgery  09/05/2015  . VEIN SURGERY Bilateral 2015   legs   Family History  Problem Relation Age of Onset  . Breast cancer Cousin        paternal side  . Breast cancer Paternal Aunt        2 PATs  . Breast cancer Maternal Aunt   . Kidney failure Sister   . Kidney cancer Neg Hx   . Bladder Cancer Neg Hx    Allergies  Allergen Reactions  . Estrogens Other (See Comments)    Blood clot  . Codeine   . Flexeril [Cyclobenzaprine]   . Penicillins   . Terfenadine Hives  . Vicodin [Hydrocodone-Acetaminophen] Hives      Assessment & Plan:  Patient presents to review vascular studies.  The patient was last seen on June 16, 2017 for evaluation of recurrent bilateral lower extremity edema.  The patient does have a previous history of  endovenous laser ablation to the left great saphenous vein on March 02, 2014 in the right small saphenous vein on March 11, 2014.  Since her previous visit, the patient has been engaging in conservative therapy including wearing medical grade 1 compression socks, elevating her legs and remaining active with minimal improvement to the edema and discomfort noted to her bilateral lower extremity.  The patient notes her symptoms have progressed to  the point that she is unable to function on a daily basis.  The patient does stand for approximately 10 to 12 hours a day for employment.  The patient notes that her symptoms have progressed to the lifestyle limiting.  The patient underwent a bilateral lower extremity venous reflux which was notable for no deep or superficial vein reflux.  No deep vein thrombosis or superficial venous thrombosis noted.  Moderate interstitial edema was noted to the bilateral lower extremity.  At this time, the patient denies any ulcer formation.  The patient denies any fever, nausea vomiting.  1) Lymphedema - Stable Despite conservative treatments including exercise, elevation and class I compression stockings the patient still presents with stage II lymphedema. The patient would greatly benefit from the added therapy of a lymphedema pump I will applied to the patient's insurance In the meantime the patient is to continue engaging conservative therapy I will see the patient back in approximately 4 months to assess her progress with conservative therapy and the added therapy of a lymphedema pump We did have a discussion about trying Unna wraps however the patient feels she will not be able to tolerate the Unna wraps and decided against trying them. Information on compression stockings, lymphedema and the lymphedema pump was given to the patient. The patient was instructed to call the office in the interim if any worsening edema or ulcerations to the legs, feet or toes  occurs. The patient expresses their understanding.  2) Chronic Venous Insufficiency - Stable The patient does have a previous history of endovenous laser ablation to the left great saphenous vein on March 02, 2014 in the right small saphenous vein on March 11, 2014.   These are still ablated.  There is no new reflux noted on today's duplex.  Current Outpatient Medications on File Prior to Visit  Medication Sig Dispense Refill  . albuterol (PROVENTIL HFA;VENTOLIN HFA) 108 (90 Base) MCG/ACT inhaler Inhale into the lungs.    Marland Kitchen amLODipine (NORVASC) 10 MG tablet Take 10 mg by mouth daily.  11  . aspirin EC 81 MG tablet Take by mouth.    . Cetirizine HCl 10 MG CAPS Take by mouth.    . Coenzyme Q10 (COQ-10 PO) Take by mouth.    . cyanocobalamin (,VITAMIN B-12,) 1000 MCG/ML injection Inject into the muscle.    Marland Kitchen dexlansoprazole (DEXILANT) 60 MG capsule Take by mouth.    . fluticasone furoate-vilanterol (BREO ELLIPTA) 100-25 MCG/INH AEPB Inhale into the lungs.    . folic acid (FOLVITE) 1 MG tablet Take 1 tablet every other day    . hydrochlorothiazide (HYDRODIURIL) 25 MG tablet Take by mouth.    . isosorbide mononitrate (IMDUR) 60 MG 24 hr tablet Take by mouth.    . levothyroxine (SYNTHROID, LEVOTHROID) 75 MCG tablet Take 88 mcg by mouth.     . losartan (COZAAR) 100 MG tablet Take by mouth.    . methotrexate (RHEUMATREX) 2.5 MG tablet Take by mouth.    . mupirocin ointment (BACTROBAN) 2 % Apply topically.    . Secukinumab (COSENTYX SENSOREADY 300 DOSE) 150 MG/ML SOAJ Inject into the skin.    Marland Kitchen simvastatin (ZOCOR) 40 MG tablet Take by mouth.    . triamcinolone ointment (KENALOG) 0.1 % Apply topically.    Marland Kitchen Apremilast 30 MG TABS Take by mouth.    . Calcium-Magnesium-Vitamin D (CALCIUM+D3 GRADUAL RELEASE) 600-40-500 MG-MG-UNIT TB24 Take by mouth.    . Cholecalciferol (VITAMIN D-1000 MAX ST) 1000 units tablet Take by mouth.    Marland Kitchen  pramipexole (MIRAPEX) 0.5 MG tablet TAKE ONE TABLET BY MOUTH AT  BEDTIME    . traMADol (ULTRAM) 50 MG tablet Take by mouth.    . zolpidem (AMBIEN) 5 MG tablet TAKE ONE TABLET BY MOUTH AT BEDTIME AS NEEDED SLEEP     No current facility-administered medications on file prior to visit.    There are no Patient Instructions on file for this visit. No follow-ups on file.  Diego Ulbricht A Jas Betten, PA-C

## 2017-08-04 DIAGNOSIS — E538 Deficiency of other specified B group vitamins: Secondary | ICD-10-CM | POA: Diagnosis not present

## 2017-08-04 DIAGNOSIS — I10 Essential (primary) hypertension: Secondary | ICD-10-CM | POA: Diagnosis not present

## 2017-08-04 DIAGNOSIS — Z0000 Encounter for general adult medical examination without abnormal findings: Secondary | ICD-10-CM | POA: Diagnosis not present

## 2017-08-04 DIAGNOSIS — E119 Type 2 diabetes mellitus without complications: Secondary | ICD-10-CM | POA: Diagnosis not present

## 2017-08-04 DIAGNOSIS — E782 Mixed hyperlipidemia: Secondary | ICD-10-CM | POA: Diagnosis not present

## 2017-08-09 ENCOUNTER — Other Ambulatory Visit: Payer: Self-pay | Admitting: Physician Assistant

## 2017-08-09 DIAGNOSIS — M25561 Pain in right knee: Secondary | ICD-10-CM | POA: Diagnosis not present

## 2017-08-09 DIAGNOSIS — R601 Generalized edema: Secondary | ICD-10-CM | POA: Diagnosis not present

## 2017-08-09 DIAGNOSIS — R0609 Other forms of dyspnea: Secondary | ICD-10-CM | POA: Diagnosis not present

## 2017-08-09 DIAGNOSIS — Z1231 Encounter for screening mammogram for malignant neoplasm of breast: Secondary | ICD-10-CM

## 2017-08-09 DIAGNOSIS — G4733 Obstructive sleep apnea (adult) (pediatric): Secondary | ICD-10-CM | POA: Diagnosis not present

## 2017-08-09 DIAGNOSIS — L4050 Arthropathic psoriasis, unspecified: Secondary | ICD-10-CM | POA: Diagnosis not present

## 2017-08-09 DIAGNOSIS — L409 Psoriasis, unspecified: Secondary | ICD-10-CM | POA: Diagnosis not present

## 2017-08-09 DIAGNOSIS — J439 Emphysema, unspecified: Secondary | ICD-10-CM | POA: Diagnosis not present

## 2017-08-11 DIAGNOSIS — E538 Deficiency of other specified B group vitamins: Secondary | ICD-10-CM | POA: Diagnosis not present

## 2017-08-15 DIAGNOSIS — Z1211 Encounter for screening for malignant neoplasm of colon: Secondary | ICD-10-CM | POA: Diagnosis not present

## 2017-08-18 ENCOUNTER — Encounter: Payer: Self-pay | Admitting: *Deleted

## 2017-08-18 ENCOUNTER — Encounter: Payer: BLUE CROSS/BLUE SHIELD | Attending: Physician Assistant | Admitting: *Deleted

## 2017-08-18 VITALS — BP 150/80 | Ht 64.0 in | Wt 275.1 lb

## 2017-08-18 DIAGNOSIS — E538 Deficiency of other specified B group vitamins: Secondary | ICD-10-CM | POA: Diagnosis not present

## 2017-08-18 DIAGNOSIS — E119 Type 2 diabetes mellitus without complications: Secondary | ICD-10-CM | POA: Diagnosis not present

## 2017-08-18 DIAGNOSIS — Z713 Dietary counseling and surveillance: Secondary | ICD-10-CM | POA: Diagnosis not present

## 2017-08-18 NOTE — Patient Instructions (Signed)
Check blood sugars 1 x day before breakfast or 2 hrs after supper every day Bring blood sugar records to the next appointment  Exercise: Walk as tolerated  Eat 3 meals day,  1  snack a day Space meals 4-6 hours apart Don't skip meals Complete 3 Day Food Record and bring to next appt  Return for appointment/classes on:  Wednesday September 20, 2017 at 1:00 pm

## 2017-08-18 NOTE — Progress Notes (Signed)
Diabetes Self-Management Education  Visit Type: First/Initial  Appt. Start Time: 0935 Appt. End Time: 6948  08/18/2017  [name], identified by name and date of birth, is a 62 y.o. female with a diagnosis of Diabetes: Type 2.   ASSESSMENT  Blood pressure (!) 150/80, height 5\' 4"  (1.626 m), weight 275 lb 1.6 oz (124.8 kg). Body mass index is 47.22 kg/m.  Diabetes Self-Management Education - 08/18/17 1134      Visit Information   Visit Type  First/Initial      Initial Visit   Diabetes Type  Type 2    Are you currently following a meal plan?  No    Are you taking your medications as prescribed?  Yes    Date Diagnosed  "I don't know how long. Have been told I had pre-diabetes". Current A1C 6.6 %. Pt had 6.6 % in 2017.      Health Coping   How would you rate your overall health?  Fair      Psychosocial Assessment   Patient Belief/Attitude about Diabetes  Other (comment) "I don't know"    Self-care barriers  None    Self-management support  Doctor's office;Family    Patient Concerns  Nutrition/Meal planning;Glycemic Control;Weight Control    Special Needs  None    Preferred Learning Style  Visual    Learning Readiness  Contemplating    How often do you need to have someone help you when you read instructions, pamphlets, or other written materials from your doctor or pharmacy?  1 - Never    What is the last grade level you completed in school?  GED      Pre-Education Assessment   Patient understands the diabetes disease and treatment process.  Needs Instruction    Patient understands incorporating nutritional management into lifestyle.  Needs Instruction    Patient undertands incorporating physical activity into lifestyle.  Needs Instruction    Patient understands using medications safely.  Needs Instruction    Patient understands monitoring blood glucose, interpreting and using results  Needs Review    Patient understands prevention, detection, and treatment of acute  complications.  Needs Instruction    Patient understands prevention, detection, and treatment of chronic complications.  Needs Review    Patient understands how to develop strategies to address psychosocial issues.  Needs Instruction    Patient understands how to develop strategies to promote health/change behavior.  Needs Instruction      Complications   Last HgB A1C per patient/outside source  6.6 % 07/20/17    How often do you check your blood sugar?  1-2 times/day    Fasting Blood glucose range (mg/dL)  70-129 FBG's 80's-110's mg/dL with reading of 116 mg/dL today.     Have you had a dilated eye exam in the past 12 months?  No has appt for 7/25    Have you had a dental exam in the past 12 months?  No upper dentures and few teeth on the bottom    Are you checking your feet?  Yes    How many days per week are you checking your feet?  7      Dietary Intake   Breakfast  skips or has eggs    Lunch  skips or has celery, peanut butter, yogurt    Dinner  beef, chicken, occasional fish, bread, potatoes, peas, beans, lettuce tomatoes, carrots, cuccumbers peppers, zucchini, squash, onions    Beverage(s)  water, coffee      Exercise  Exercise Type  ADL's      Patient Education   Previous Diabetes Education  No She reports she receives education from her sister who is LPN.     Disease state   Definition of diabetes, type 1 and 2, and the diagnosis of diabetes    Nutrition management   Role of diet in the treatment of diabetes and the relationship between the three main macronutrients and blood glucose level;Reviewed blood glucose goals for pre and post meals and how to evaluate the patients' food intake on their blood glucose level.    Physical activity and exercise   Role of exercise on diabetes management, blood pressure control and cardiac health.    Monitoring  Purpose and frequency of SMBG.;Taught/discussed recording of test results and interpretation of SMBG.;Identified appropriate SMBG  and/or A1C goals.    Chronic complications  Relationship between chronic complications and blood glucose control    Psychosocial adjustment  Identified and addressed patients feelings and concerns about diabetes      Individualized Goals (developed by patient)   Reducing Risk  Improve blood sugars Lose weight     Outcomes   Expected Outcomes  Demonstrated interest in learning. Expect positive outcomes    Future DMSE  4-6 wks       Individualized Plan for Diabetes Self-Management Training:   Learning Objective:  Patient will have a greater understanding of diabetes self-management. Patient education plan is to attend individual and/or group sessions per assessed needs and concerns.   Plan:   Patient Instructions  Check blood sugars 1 x day before breakfast or 2 hrs after supper every day Bring blood sugar records to the next appointment Exercise: Walk as tolerated Eat 3 meals day,  1  snack a day Space meals 4-6 hours apart Don't skip meals Complete 3 Day Food Record and bring to next appt Return for appointment/classes on:  Wednesday September 20, 2017 at 1:00 pm  Expected Outcomes:  Demonstrated interest in learning. Expect positive outcomes  Education material provided:  General Meal Planning Guidelines Simple Meal Plan  If problems or questions, patient to contact team via:  Jacqueline Rhodes, [place], [place], CDE [phone]  Future DSME appointment: 4-6 wks  September 20, 2017 with the dietitian

## 2017-08-25 DIAGNOSIS — E538 Deficiency of other specified B group vitamins: Secondary | ICD-10-CM | POA: Diagnosis not present

## 2017-08-25 DIAGNOSIS — C73 Malignant neoplasm of thyroid gland: Secondary | ICD-10-CM | POA: Diagnosis not present

## 2017-08-30 ENCOUNTER — Ambulatory Visit
Admission: RE | Admit: 2017-08-30 | Discharge: 2017-08-30 | Disposition: A | Discharge status: Home / Self Care | Payer: BLUE CROSS/BLUE SHIELD | Source: Ambulatory Visit | Attending: Physician Assistant | Admitting: Physician Assistant

## 2017-08-30 DIAGNOSIS — Z1231 Encounter for screening mammogram for malignant neoplasm of breast: Secondary | ICD-10-CM | POA: Insufficient documentation

## 2017-08-31 ENCOUNTER — Other Ambulatory Visit: Payer: Self-pay | Admitting: Physician Assistant

## 2017-08-31 DIAGNOSIS — Z1231 Encounter for screening mammogram for malignant neoplasm of breast: Secondary | ICD-10-CM

## 2017-09-01 ENCOUNTER — Telehealth (INDEPENDENT_AMBULATORY_CARE_PROVIDER_SITE_OTHER): Payer: Self-pay

## 2017-09-01 NOTE — Telephone Encounter (Signed)
Patient called stating that she has yet to hear anything from the [facility] and she would like to know is there someone that you can call to see what the hold up is? She states that she was last seen in the office on 07/26/17.

## 2017-09-04 NOTE — Telephone Encounter (Signed)
Called the patient back and gave her the contact information for Mr. Shaune Spittle. She stated that she had just gotten a message from their company, and that she is going to still give them a call to see what the hold up is.

## 2017-09-04 NOTE — Telephone Encounter (Signed)
The patient's paperwork was sent to [employer] on Jul 27, 2017.  It is in the media section of the patient's chart stating the date / which company her information was sent to.  Please give her [employer] phone number to inquire about her pump.  Thank you.

## 2017-09-06 DIAGNOSIS — E538 Deficiency of other specified B group vitamins: Secondary | ICD-10-CM | POA: Diagnosis not present

## 2017-09-06 DIAGNOSIS — C73 Malignant neoplasm of thyroid gland: Secondary | ICD-10-CM | POA: Diagnosis not present

## 2017-09-08 ENCOUNTER — Other Ambulatory Visit: Payer: Self-pay | Admitting: Physician Assistant

## 2017-09-08 DIAGNOSIS — N6320 Unspecified lump in the left breast, unspecified quadrant: Secondary | ICD-10-CM

## 2017-09-13 ENCOUNTER — Ambulatory Visit
Admission: RE | Admit: 2017-09-13 | Discharge: 2017-09-13 | Disposition: A | Discharge status: Home / Self Care | Payer: BLUE CROSS/BLUE SHIELD | Source: Ambulatory Visit | Attending: Physician Assistant | Admitting: Physician Assistant

## 2017-09-13 DIAGNOSIS — N6320 Unspecified lump in the left breast, unspecified quadrant: Secondary | ICD-10-CM

## 2017-09-13 DIAGNOSIS — N6489 Other specified disorders of breast: Secondary | ICD-10-CM | POA: Diagnosis not present

## 2017-09-13 DIAGNOSIS — R922 Inconclusive mammogram: Secondary | ICD-10-CM | POA: Diagnosis not present

## 2017-09-20 ENCOUNTER — Encounter: Payer: Self-pay | Admitting: Dietician

## 2017-09-20 ENCOUNTER — Encounter: Payer: BLUE CROSS/BLUE SHIELD | Attending: Physician Assistant | Admitting: Dietician

## 2017-09-20 VITALS — BP 148/74 | Ht 64.0 in | Wt 268.5 lb

## 2017-09-20 DIAGNOSIS — E119 Type 2 diabetes mellitus without complications: Secondary | ICD-10-CM | POA: Diagnosis not present

## 2017-09-20 DIAGNOSIS — Z713 Dietary counseling and surveillance: Secondary | ICD-10-CM | POA: Insufficient documentation

## 2017-09-20 DIAGNOSIS — I10 Essential (primary) hypertension: Secondary | ICD-10-CM | POA: Diagnosis not present

## 2017-09-20 DIAGNOSIS — I25118 Atherosclerotic heart disease of native coronary artery with other forms of angina pectoris: Secondary | ICD-10-CM | POA: Diagnosis not present

## 2017-09-20 DIAGNOSIS — G4733 Obstructive sleep apnea (adult) (pediatric): Secondary | ICD-10-CM | POA: Diagnosis not present

## 2017-09-20 NOTE — Progress Notes (Signed)
Diabetes Self-Management Education  Visit Type:  Follow-up  Appt. Start Time: 1300 Appt. End Time: 1400  09/20/2017  Jacqueline Rhodes, identified by name and date of birth, is a 62 y.o. female with a diagnosis of Diabetes:  .   ASSESSMENT  Blood pressure (!) 148/74, height 5\' 4"  (1.626 m), weight 268 lb 8 oz (121.8 kg). Body mass index is 46.09 kg/m.   Diabetes Self-Management Education - 73/41/93 7902      Complications   How often do you check your blood sugar?  1-2 times/day    Fasting Blood glucose range (mg/dL)  70-129    Postprandial Blood glucose range (mg/dL)  70-129;130-179    Have you had a dilated eye exam in the past 12 months?  No appointment 10/05/17    Have you had a dental exam in the past 12 months?  No    Are you checking your feet?  Yes    How many days per week are you checking your feet?  7      Dietary Intake   Breakfast  2 meals and 1 snack daily; breakfast--none or breakfast sandwich    Lunch  crackers with peanut butter (snack)    Dinner  salad or sandwich in hot weather     Snack (evening)  usually none; had 6 cherries several days ago    Beverage(s)  water 1-2 liters daily; 1/2 - 1 cup coffee daily; sometimes fresh lemonade with stevia, frozen strawberries; rarely hot tea      Exercise   Exercise Type  ADL's      Patient Education   Nutrition management   Role of diet in the treatment of diabetes and the relationship between the three main macronutrients and blood glucose level;Meal timing in regards to the patients' current diabetes medication.;Meal options for control of blood glucose level and chronic complications.;Other (comment) role of carbohydrate and protein in BG control    Monitoring  Taught/discussed recording of test results and interpretation of SMBG.      Post-Education Assessment   Patient understands the diabetes disease and treatment process.  Demonstrates understanding / competency    Patient understands incorporating  nutritional management into lifestyle.  Demonstrates understanding / competency    Patient undertands incorporating physical activity into lifestyle.  Demonstrates understanding / competency patient is unable to exercise at this time    Patient understands using medications safely.  Demonstrates understanding / competency    Patient understands monitoring blood glucose, interpreting and using results  Demonstrates understanding / competency    Patient understands prevention, detection, and treatment of acute complications.  Needs Review    Patient understands prevention, detection, and treatment of chronic complications.  Demonstrates understanding / competency    Patient understands how to develop strategies to address psychosocial issues.  Needs Review    Patient understands how to develop strategies to promote health/change behavior.  Demonstrates understanding / competency       Learning Objective:  Patient will have a greater understanding of diabetes self-management. Patient education plan is to attend individual and/or group sessions per assessed needs and concerns.  Patient is making healthy food choices, she also tries to eat organic foods and limits wheat products as well as breads and other starches overall. She reports recent fluctuations in weight due to thyroid function, history of thyroid cancer. Discussed appropriate nutrient balance and necessity of some healthy carb foods on a regular basis.    Plan:   Patient Instructions   Continue  making healthy food choices for blood sugar control and inflammation fighting.   Keep simple options on hand to stay nourished throughout the day -- lean protein sources, generous portions of vegetables, some fruits, and small-moderate portions of healthy carbs.   Include at least 9 servings of carb foods each day to meet minimum needs which are 130grams daily.     Expected Outcomes:  Demonstrated interest in learning. Expect positive  outcomes  Education material provided: Quick and Healthy Meal Ideas; Smart and Sound Snacking  If problems or questions, patient to contact team via:  Phone and Email

## 2017-09-20 NOTE — Patient Instructions (Signed)
   Continue making healthy food choices for blood sugar control and inflammation fighting.   Keep simple options on hand to stay nourished throughout the day -- lean protein sources, generous portions of vegetables, some fruits, and small-moderate portions of healthy carbs.   Include at least 9 servings of carb foods each day to meet minimum needs which are 130grams daily.

## 2017-10-02 ENCOUNTER — Encounter: Payer: Self-pay | Admitting: Dietician

## 2017-10-03 ENCOUNTER — Telehealth (INDEPENDENT_AMBULATORY_CARE_PROVIDER_SITE_OTHER): Payer: Self-pay | Admitting: Vascular Surgery

## 2017-10-03 NOTE — Telephone Encounter (Signed)
I inform the patient the form that [employer] needed signed by the provider has been faxed over and if she have any questions she can call [employer].

## 2017-10-03 NOTE — Telephone Encounter (Signed)
Patient wants to know what is going on with the sleeves for her legs. Jacqueline Rhodes it has been a whlie now and she still waiting. Said she called [employer] and nothing has been submitted. [phone]

## 2017-10-05 DIAGNOSIS — E119 Type 2 diabetes mellitus without complications: Secondary | ICD-10-CM | POA: Diagnosis not present

## 2017-10-09 DIAGNOSIS — I890 Lymphedema, not elsewhere classified: Secondary | ICD-10-CM | POA: Diagnosis not present

## 2017-10-16 DIAGNOSIS — M79672 Pain in left foot: Secondary | ICD-10-CM | POA: Diagnosis not present

## 2017-10-16 DIAGNOSIS — D2372 Other benign neoplasm of skin of left lower limb, including hip: Secondary | ICD-10-CM | POA: Diagnosis not present

## 2017-10-16 DIAGNOSIS — M79671 Pain in right foot: Secondary | ICD-10-CM | POA: Diagnosis not present

## 2017-10-16 DIAGNOSIS — D2371 Other benign neoplasm of skin of right lower limb, including hip: Secondary | ICD-10-CM | POA: Diagnosis not present

## 2017-11-08 DIAGNOSIS — M216X1 Other acquired deformities of right foot: Secondary | ICD-10-CM | POA: Diagnosis not present

## 2017-11-08 DIAGNOSIS — M79672 Pain in left foot: Secondary | ICD-10-CM | POA: Diagnosis not present

## 2017-11-08 DIAGNOSIS — I890 Lymphedema, not elsewhere classified: Secondary | ICD-10-CM | POA: Diagnosis not present

## 2017-11-08 DIAGNOSIS — D2372 Other benign neoplasm of skin of left lower limb, including hip: Secondary | ICD-10-CM | POA: Diagnosis not present

## 2017-11-08 DIAGNOSIS — M216X2 Other acquired deformities of left foot: Secondary | ICD-10-CM | POA: Diagnosis not present

## 2017-11-08 DIAGNOSIS — M79671 Pain in right foot: Secondary | ICD-10-CM | POA: Diagnosis not present

## 2017-11-09 DIAGNOSIS — L409 Psoriasis, unspecified: Secondary | ICD-10-CM | POA: Diagnosis not present

## 2017-11-09 DIAGNOSIS — E119 Type 2 diabetes mellitus without complications: Secondary | ICD-10-CM | POA: Diagnosis not present

## 2017-11-09 DIAGNOSIS — L4050 Arthropathic psoriasis, unspecified: Secondary | ICD-10-CM | POA: Diagnosis not present

## 2017-11-09 DIAGNOSIS — E782 Mixed hyperlipidemia: Secondary | ICD-10-CM | POA: Diagnosis not present

## 2017-11-09 DIAGNOSIS — I10 Essential (primary) hypertension: Secondary | ICD-10-CM | POA: Diagnosis not present

## 2017-11-09 DIAGNOSIS — Z79899 Other long term (current) drug therapy: Secondary | ICD-10-CM | POA: Diagnosis not present

## 2017-11-29 ENCOUNTER — Encounter (INDEPENDENT_AMBULATORY_CARE_PROVIDER_SITE_OTHER): Payer: Self-pay | Admitting: Vascular Surgery

## 2017-11-29 ENCOUNTER — Ambulatory Visit (INDEPENDENT_AMBULATORY_CARE_PROVIDER_SITE_OTHER): Payer: BLUE CROSS/BLUE SHIELD | Admitting: Vascular Surgery

## 2017-11-29 VITALS — BP 140/75 | HR 76 | Resp 18 | Ht 64.0 in | Wt 266.0 lb

## 2017-11-29 DIAGNOSIS — Z0189 Encounter for other specified special examinations: Secondary | ICD-10-CM | POA: Diagnosis not present

## 2017-11-29 DIAGNOSIS — I25118 Atherosclerotic heart disease of native coronary artery with other forms of angina pectoris: Secondary | ICD-10-CM | POA: Diagnosis not present

## 2017-11-29 DIAGNOSIS — I872 Venous insufficiency (chronic) (peripheral): Secondary | ICD-10-CM | POA: Diagnosis not present

## 2017-11-29 DIAGNOSIS — E118 Type 2 diabetes mellitus with unspecified complications: Secondary | ICD-10-CM | POA: Diagnosis not present

## 2017-11-29 DIAGNOSIS — Z23 Encounter for immunization: Secondary | ICD-10-CM | POA: Diagnosis not present

## 2017-11-29 DIAGNOSIS — E782 Mixed hyperlipidemia: Secondary | ICD-10-CM | POA: Diagnosis not present

## 2017-11-29 DIAGNOSIS — I10 Essential (primary) hypertension: Secondary | ICD-10-CM | POA: Diagnosis not present

## 2017-11-29 DIAGNOSIS — E538 Deficiency of other specified B group vitamins: Secondary | ICD-10-CM | POA: Diagnosis not present

## 2017-11-29 DIAGNOSIS — I890 Lymphedema, not elsewhere classified: Secondary | ICD-10-CM

## 2017-11-29 DIAGNOSIS — Z794 Long term (current) use of insulin: Secondary | ICD-10-CM | POA: Diagnosis not present

## 2017-11-29 NOTE — Progress Notes (Signed)
Subjective:    Patient ID: Jacqueline Rhodes, female    DOB: 03/29/1955, 62 y.o.   MRN: [id number] Chief Complaint  Patient presents with  . Follow-up    4 month Lymph check   The patient presents for a 77-month lymphedema follow-up.  Since her initial visit, the patient has been engaging in conservative therapy including wearing medical grade 1 compression socks, elevating her legs and remaining as active as possible.  The patient has also received her lymphedema pump and has started using it 1-2 times a day for an hour each time.  The patient feels that her symptoms have improved.  The patient notes that there has been an improvement in the edema and discomfort she was experiencing to her bilateral legs.  The patient denies any worsening edema, cellulitis or ulcer formation to the bilateral legs.  The patient is pleased with the results.  The patient denies any fever, nausea vomiting.  Review of Systems  Constitutional: Negative.   HENT: Negative.   Eyes: Negative.   Respiratory: Negative.   Cardiovascular: Positive for leg swelling.  Gastrointestinal: Negative.   Endocrine: Negative.   Genitourinary: Negative.   Musculoskeletal: Negative.   Skin: Negative.   Allergic/Immunologic: Negative.   Neurological: Negative.   Hematological: Negative.   Psychiatric/Behavioral: Negative.       Objective:   Physical Exam  Constitutional: She is oriented to person, place, and time. She appears well-developed and well-nourished. No distress.  HENT:  Head: Normocephalic and atraumatic.  Right Ear: External ear normal.  Left Ear: External ear normal.  Eyes: Pupils are equal, round, and reactive to light. Conjunctivae and EOM are normal.  Neck: Normal range of motion.  Cardiovascular: Normal rate, regular rhythm and normal heart sounds.  Pulmonary/Chest: Effort normal and breath sounds normal.  Musculoskeletal: Normal range of motion. She exhibits edema (Mild non-pitting lower extremity edema  noted).  Neurological: She is alert and oriented to person, place, and time.  Skin: Skin is warm and dry. She is not diaphoretic.  Psychiatric: She has a normal mood and affect. Her behavior is normal. Judgment and thought content normal.  Vitals reviewed.  BP 140/75 (BP Location: Right Arm, Patient Position: Sitting)   Pulse 76   Resp 18   Ht 5\' 4"  (1.626 m)   Wt 266 lb (120.7 kg)   BMI 45.66 kg/m   Past Medical History:  Diagnosis Date  . Anemia   . Arthritis   . Collagen vascular disease ([place])   . COPD (chronic obstructive pulmonary disease) ([place])   . Coronary artery disease   . Diabetes mellitus without complication ([place])   . GERD (gastroesophageal reflux disease)   . Hyperlipidemia   . Hypertension   . Lymphedema   . RA (rheumatoid arthritis) ([place])   . Sleep apnea   . Thyroid cancer [facility])    Social History   Socioeconomic History  . Marital status: Married    Spouse name: Not on file  . Number of children: Not on file  . Years of education: Not on file  . Highest education level: Not on file  Occupational History  . Not on file  Social Needs  . Financial resource strain: Not on file  . Food insecurity:    Worry: Not on file    Inability: Not on file  . Transportation needs:    Medical: Not on file    Non-medical: Not on file  Tobacco Use  . Smoking status: Former Smoker  Packs/day: 0.50    Years: 30.00    Pack years: 15.00    Types: Cigarettes    Last attempt to quit: 2005    Years since quitting: 14.7  . Smokeless tobacco: Never Used  Substance and Sexual Activity  . Alcohol use: No  . Drug use: No  . Sexual activity: Not on file  Lifestyle  . Physical activity:    Days per week: Not on file    Minutes per session: Not on file  . Stress: Not on file  Relationships  . Social connections:    Talks on phone: Not on file    Gets together: Not on file    Attends religious service: Not on file    Active member of club or organization: Not on  file    Attends meetings of clubs or organizations: Not on file    Relationship status: Not on file  . Intimate partner violence:    Fear of current or ex partner: Not on file    Emotionally abused: Not on file    Physically abused: Not on file    Forced sexual activity: Not on file  Other Topics Concern  . Not on file  Social History Narrative  . Not on file   Past Surgical History:  Procedure Laterality Date  . ABDOMINAL HYSTERECTOMY  2003  . BIOPSY THYROID  2017   x 2  . BREAST BIOPSY Left 2000   benign  . BREAST BIOPSY Right 2015   benign  . CARDIAC SURGERY     2006, 2009  . CATARACT EXTRACTION, BILATERAL  2014  . left ankle surgery  2001  . left shoulder surgery  1996  . right breast biopsy  2015  . right elbow surgery  1999  . thyroid cancer surgery  09/05/2015  . VEIN SURGERY Bilateral 2015   legs   Family History  Problem Relation Age of Onset  . Breast cancer Cousin        paternal side  . Breast cancer Paternal Aunt        2 PATs  . Breast cancer Maternal Aunt   . Kidney failure Sister   . Diabetes Sister   . Diabetes Mother   . Kidney cancer Neg Hx   . Bladder Cancer Neg Hx    Allergies  Allergen Reactions  . Estrogens Other (See Comments)    Blood clot  . Codeine Other (See Comments)    Migraine  . Flexeril [Cyclobenzaprine] Hives  . Penicillins Hives  . Terfenadine Hives  . Vicodin [Hydrocodone-Acetaminophen] Hives      Assessment & Plan:  The patient presents for a 48-month lymphedema follow-up.  Since her initial visit, the patient has been engaging in conservative therapy including wearing medical grade 1 compression socks, elevating her legs and remaining as active as possible.  The patient has also received her lymphedema pump and has started using it 1-2 times a day for an hour each time.  The patient feels that her symptoms have improved.  The patient notes that there has been an improvement in the edema and discomfort she was experiencing  to her bilateral legs.  The patient denies any worsening edema, cellulitis or ulcer formation to the bilateral legs.  The patient is pleased with the results.  The patient denies any fever, nausea vomiting.  1. Lymphedema - Stable The patient presents for a 84-month lymphedema/chronic venous insufficiency follow-up. The patient has been engaging conservative therapy including wearing medical grade 1  compression socks, elevating her legs and remaining active on a daily basis.  The patient is also received her lymphedema pump and has been using it 1-2 times a day for an hour each time. The patient feels that her current regimen has greatly approved her bilateral lower extremity edema discomfort The patient should continue engaging conservative therapy with the use of her lymphedema pump 1-2 times a day for an hour each time The patient was instructed to call the office in the interim if any worsening edema or ulcerations to the legs, feet or toes occurs. The patient expresses their understanding. The patient is to follow-up in 6 months so I can assess her progress with conservative therapy and the lymphedema pump.  2. Chronic venous insufficiency - Stable As above  Current Outpatient Medications on File Prior to Visit  Medication Sig Dispense Refill  . albuterol (PROVENTIL HFA;VENTOLIN HFA) 108 (90 Base) MCG/ACT inhaler Inhale 2 puffs into the lungs every 6 (six) hours as needed.     Marland Kitchen amLODipine (NORVASC) 10 MG tablet Take 5 mg by mouth daily.   11  . aspirin EC 81 MG tablet Take 81 mg by mouth daily.     . Calcium-Magnesium-Vitamin D (CALCIUM+D3 GRADUAL RELEASE) 600-40-500 MG-MG-UNIT TB24 Take 1 tablet by mouth daily.     . Cetirizine HCl 10 MG CAPS Take 10 mg by mouth daily.     . Coenzyme Q10 (COQ-10 PO) Take 1 capsule by mouth daily.     . cyanocobalamin (,VITAMIN B-12,) 1000 MCG/ML injection Inject 1,000 mcg into the muscle every 30 (thirty) days.     Marland Kitchen dexlansoprazole (DEXILANT) 60 MG  capsule Take 60 mg by mouth daily.     . fluticasone furoate-vilanterol (BREO ELLIPTA) 100-25 MCG/INH AEPB Inhale 1 puff into the lungs daily.     . folic acid (FOLVITE) 1 MG tablet Take 1 tablet every other day    . glucose blood (ACCU-CHEK COMPACT PLUS) test strip Use 4 (four) times daily. Use as instructed.    . hydrochlorothiazide (HYDRODIURIL) 25 MG tablet Take 25 mg by mouth daily.     . isosorbide mononitrate (IMDUR) 60 MG 24 hr tablet Take 60 mg by mouth daily.     Marland Kitchen levothyroxine (SYNTHROID, LEVOTHROID) 100 MCG tablet TAKE ONE TABLET BY MOUTH EVERY MORNING ON EMPTY STOMACH WITH FULL GLASS OF WATER AT LEAST 30 TO 60 MINUTES BEFORE BREAKFAST  1  . losartan (COZAAR) 100 MG tablet Take 100 mg by mouth daily.     . methotrexate (RHEUMATREX) 2.5 MG tablet Take 20 mg by mouth once a week.     . mupirocin ointment (BACTROBAN) 2 % Apply 1 application topically daily.     . Secukinumab (COSENTYX SENSOREADY 300 DOSE) 150 MG/ML SOAJ Inject 300 mg into the skin every 28 (twenty-eight) days.     . simvastatin (ZOCOR) 40 MG tablet Take 20 mg by mouth daily.      No current facility-administered medications on file prior to visit.    There are no Patient Instructions on file for this visit. No follow-ups on file.  Mayukha Symmonds A Jessen Siegman, PA-C

## 2017-12-06 DIAGNOSIS — M79672 Pain in left foot: Secondary | ICD-10-CM | POA: Diagnosis not present

## 2017-12-06 DIAGNOSIS — D2372 Other benign neoplasm of skin of left lower limb, including hip: Secondary | ICD-10-CM | POA: Diagnosis not present

## 2017-12-06 DIAGNOSIS — L853 Xerosis cutis: Secondary | ICD-10-CM | POA: Diagnosis not present

## 2017-12-06 DIAGNOSIS — M79671 Pain in right foot: Secondary | ICD-10-CM | POA: Diagnosis not present

## 2017-12-07 DIAGNOSIS — G8929 Other chronic pain: Secondary | ICD-10-CM | POA: Diagnosis not present

## 2017-12-07 DIAGNOSIS — M1711 Unilateral primary osteoarthritis, right knee: Secondary | ICD-10-CM | POA: Diagnosis not present

## 2017-12-07 DIAGNOSIS — M25561 Pain in right knee: Secondary | ICD-10-CM | POA: Diagnosis not present

## 2017-12-07 DIAGNOSIS — L409 Psoriasis, unspecified: Secondary | ICD-10-CM | POA: Diagnosis not present

## 2017-12-07 DIAGNOSIS — R601 Generalized edema: Secondary | ICD-10-CM | POA: Diagnosis not present

## 2017-12-07 DIAGNOSIS — L4050 Arthropathic psoriasis, unspecified: Secondary | ICD-10-CM | POA: Diagnosis not present

## 2017-12-09 DIAGNOSIS — I890 Lymphedema, not elsewhere classified: Secondary | ICD-10-CM | POA: Diagnosis not present

## 2017-12-12 DIAGNOSIS — M25561 Pain in right knee: Secondary | ICD-10-CM | POA: Diagnosis not present

## 2017-12-12 DIAGNOSIS — G8929 Other chronic pain: Secondary | ICD-10-CM | POA: Diagnosis not present

## 2017-12-14 DIAGNOSIS — R0609 Other forms of dyspnea: Secondary | ICD-10-CM | POA: Diagnosis not present

## 2017-12-14 DIAGNOSIS — G4733 Obstructive sleep apnea (adult) (pediatric): Secondary | ICD-10-CM | POA: Diagnosis not present

## 2017-12-28 ENCOUNTER — Ambulatory Visit: Payer: BLUE CROSS/BLUE SHIELD | Admitting: Urology

## 2017-12-28 DIAGNOSIS — E782 Mixed hyperlipidemia: Secondary | ICD-10-CM | POA: Diagnosis not present

## 2017-12-28 DIAGNOSIS — E119 Type 2 diabetes mellitus without complications: Secondary | ICD-10-CM | POA: Diagnosis not present

## 2017-12-28 DIAGNOSIS — G4733 Obstructive sleep apnea (adult) (pediatric): Secondary | ICD-10-CM | POA: Diagnosis not present

## 2017-12-28 DIAGNOSIS — Z01818 Encounter for other preprocedural examination: Secondary | ICD-10-CM | POA: Diagnosis not present

## 2017-12-28 DIAGNOSIS — I25118 Atherosclerotic heart disease of native coronary artery with other forms of angina pectoris: Secondary | ICD-10-CM | POA: Diagnosis not present

## 2018-01-04 DIAGNOSIS — I208 Other forms of angina pectoris: Secondary | ICD-10-CM | POA: Diagnosis present

## 2018-01-10 ENCOUNTER — Ambulatory Visit
Admission: RE | Admit: 2018-01-10 | Discharge: 2018-01-10 | Disposition: A | Discharge status: Home / Self Care | Payer: BLUE CROSS/BLUE SHIELD | Source: Ambulatory Visit | Attending: Internal Medicine | Admitting: Internal Medicine

## 2018-01-10 ENCOUNTER — Encounter
Admission: RE | Disposition: A | Discharge status: Home / Self Care | Payer: Self-pay | Source: Ambulatory Visit | Attending: Internal Medicine

## 2018-01-10 ENCOUNTER — Encounter: Payer: Self-pay | Admitting: *Deleted

## 2018-01-10 DIAGNOSIS — I25110 Atherosclerotic heart disease of native coronary artery with unstable angina pectoris: Secondary | ICD-10-CM | POA: Diagnosis not present

## 2018-01-10 DIAGNOSIS — E669 Obesity, unspecified: Secondary | ICD-10-CM | POA: Diagnosis not present

## 2018-01-10 DIAGNOSIS — M069 Rheumatoid arthritis, unspecified: Secondary | ICD-10-CM | POA: Insufficient documentation

## 2018-01-10 DIAGNOSIS — Z85850 Personal history of malignant neoplasm of thyroid: Secondary | ICD-10-CM | POA: Diagnosis not present

## 2018-01-10 DIAGNOSIS — Z87891 Personal history of nicotine dependence: Secondary | ICD-10-CM | POA: Insufficient documentation

## 2018-01-10 DIAGNOSIS — I25119 Atherosclerotic heart disease of native coronary artery with unspecified angina pectoris: Secondary | ICD-10-CM | POA: Insufficient documentation

## 2018-01-10 DIAGNOSIS — M1990 Unspecified osteoarthritis, unspecified site: Secondary | ICD-10-CM | POA: Insufficient documentation

## 2018-01-10 DIAGNOSIS — Z885 Allergy status to narcotic agent status: Secondary | ICD-10-CM | POA: Diagnosis not present

## 2018-01-10 DIAGNOSIS — G5600 Carpal tunnel syndrome, unspecified upper limb: Secondary | ICD-10-CM | POA: Insufficient documentation

## 2018-01-10 DIAGNOSIS — Z8261 Family history of arthritis: Secondary | ICD-10-CM | POA: Insufficient documentation

## 2018-01-10 DIAGNOSIS — Z90710 Acquired absence of both cervix and uterus: Secondary | ICD-10-CM | POA: Diagnosis not present

## 2018-01-10 DIAGNOSIS — E785 Hyperlipidemia, unspecified: Secondary | ICD-10-CM | POA: Insufficient documentation

## 2018-01-10 DIAGNOSIS — E119 Type 2 diabetes mellitus without complications: Secondary | ICD-10-CM | POA: Insufficient documentation

## 2018-01-10 DIAGNOSIS — Z79899 Other long term (current) drug therapy: Secondary | ICD-10-CM | POA: Insufficient documentation

## 2018-01-10 DIAGNOSIS — Z7982 Long term (current) use of aspirin: Secondary | ICD-10-CM | POA: Diagnosis not present

## 2018-01-10 DIAGNOSIS — K219 Gastro-esophageal reflux disease without esophagitis: Secondary | ICD-10-CM | POA: Diagnosis not present

## 2018-01-10 DIAGNOSIS — H259 Unspecified age-related cataract: Secondary | ICD-10-CM | POA: Insufficient documentation

## 2018-01-10 DIAGNOSIS — R0602 Shortness of breath: Secondary | ICD-10-CM | POA: Diagnosis not present

## 2018-01-10 DIAGNOSIS — Z888 Allergy status to other drugs, medicaments and biological substances status: Secondary | ICD-10-CM | POA: Insufficient documentation

## 2018-01-10 DIAGNOSIS — Z8349 Family history of other endocrine, nutritional and metabolic diseases: Secondary | ICD-10-CM | POA: Insufficient documentation

## 2018-01-10 DIAGNOSIS — Z809 Family history of malignant neoplasm, unspecified: Secondary | ICD-10-CM | POA: Insufficient documentation

## 2018-01-10 DIAGNOSIS — Z9889 Other specified postprocedural states: Secondary | ICD-10-CM | POA: Diagnosis not present

## 2018-01-10 DIAGNOSIS — I10 Essential (primary) hypertension: Secondary | ICD-10-CM | POA: Insufficient documentation

## 2018-01-10 DIAGNOSIS — R42 Dizziness and giddiness: Secondary | ICD-10-CM | POA: Insufficient documentation

## 2018-01-10 DIAGNOSIS — Z823 Family history of stroke: Secondary | ICD-10-CM | POA: Insufficient documentation

## 2018-01-10 DIAGNOSIS — Z880 Allergy status to penicillin: Secondary | ICD-10-CM | POA: Diagnosis not present

## 2018-01-10 DIAGNOSIS — R079 Chest pain, unspecified: Secondary | ICD-10-CM

## 2018-01-10 DIAGNOSIS — Z9849 Cataract extraction status, unspecified eye: Secondary | ICD-10-CM | POA: Diagnosis not present

## 2018-01-10 DIAGNOSIS — G4730 Sleep apnea, unspecified: Secondary | ICD-10-CM | POA: Insufficient documentation

## 2018-01-10 DIAGNOSIS — Z6841 Body Mass Index (BMI) 40.0 and over, adult: Secondary | ICD-10-CM | POA: Diagnosis not present

## 2018-01-10 DIAGNOSIS — Z79890 Hormone replacement therapy (postmenopausal): Secondary | ICD-10-CM | POA: Insufficient documentation

## 2018-01-10 DIAGNOSIS — Z8249 Family history of ischemic heart disease and other diseases of the circulatory system: Secondary | ICD-10-CM | POA: Insufficient documentation

## 2018-01-10 DIAGNOSIS — I208 Other forms of angina pectoris: Secondary | ICD-10-CM | POA: Diagnosis present

## 2018-01-10 DIAGNOSIS — Z833 Family history of diabetes mellitus: Secondary | ICD-10-CM | POA: Insufficient documentation

## 2018-01-10 HISTORY — PX: LEFT HEART CATH AND CORONARY ANGIOGRAPHY: CATH118249

## 2018-01-10 LAB — GLUCOSE, CAPILLARY: GLUCOSE-CAPILLARY: 112 mg/dL — AB (ref 70–99)

## 2018-01-10 SURGERY — LEFT HEART CATH AND CORONARY ANGIOGRAPHY
Anesthesia: Moderate Sedation | Laterality: Left

## 2018-01-10 MED ORDER — MIDAZOLAM HCL 2 MG/2ML IJ SOLN
INTRAMUSCULAR | Status: AC
  Filled 2018-01-10: qty 2

## 2018-01-10 MED ORDER — IOPAMIDOL (ISOVUE-300) INJECTION 61%
INTRAVENOUS | Status: DC | PRN
  Administered 2018-01-10: 160 mL via INTRA_ARTERIAL

## 2018-01-10 MED ORDER — HEPARIN (PORCINE) IN NACL 1000-0.9 UT/500ML-% IV SOLN
INTRAVENOUS | Status: AC
  Filled 2018-01-10: qty 1000

## 2018-01-10 MED ORDER — FENTANYL CITRATE (PF) 100 MCG/2ML IJ SOLN
INTRAMUSCULAR | Status: DC | PRN
  Administered 2018-01-10: 50 ug via INTRAVENOUS
  Administered 2018-01-10: 25 ug via INTRAVENOUS

## 2018-01-10 MED ORDER — MIDAZOLAM HCL 2 MG/2ML IJ SOLN
INTRAMUSCULAR | Status: DC | PRN
  Administered 2018-01-10: 1 mg via INTRAVENOUS

## 2018-01-10 MED ORDER — ACETAMINOPHEN 325 MG PO TABS: 650.0000 mg | ORAL_TABLET | ORAL | Status: DC | PRN

## 2018-01-10 MED ORDER — SODIUM CHLORIDE 0.9 % IV SOLN: 250.0000 mL | INTRAVENOUS | Status: DC | PRN

## 2018-01-10 MED ORDER — SODIUM CHLORIDE 0.9 % WEIGHT BASED INFUSION: 1.0000 mL/kg/h | INTRAVENOUS | Status: DC

## 2018-01-10 MED ORDER — SODIUM CHLORIDE 0.9 % WEIGHT BASED INFUSION
3.0000 mL/kg/h | INTRAVENOUS | Status: AC
  Administered 2018-01-10: 3 mL/kg/h via INTRAVENOUS

## 2018-01-10 MED ORDER — SODIUM CHLORIDE 0.9% FLUSH: 3.0000 mL | INTRAVENOUS | Status: DC | PRN

## 2018-01-10 MED ORDER — ONDANSETRON HCL 4 MG/2ML IJ SOLN: 4.0000 mg | Freq: Four times a day (QID) | INTRAMUSCULAR | Status: DC | PRN

## 2018-01-10 MED ORDER — ASPIRIN 81 MG PO CHEW
CHEWABLE_TABLET | ORAL | Status: AC
  Administered 2018-01-10: 81 mg
  Filled 2018-01-10: qty 1

## 2018-01-10 MED ORDER — FENTANYL CITRATE (PF) 100 MCG/2ML IJ SOLN
INTRAMUSCULAR | Status: AC
  Filled 2018-01-10: qty 2

## 2018-01-10 MED ORDER — ASPIRIN 81 MG PO CHEW: 81.0000 mg | CHEWABLE_TABLET | ORAL | Status: DC

## 2018-01-10 MED ORDER — SODIUM CHLORIDE 0.9% FLUSH: 3.0000 mL | Freq: Two times a day (BID) | INTRAVENOUS | Status: DC

## 2018-01-10 SURGICAL SUPPLY — 9 items
CATH INFINITI 5FR ANG PIGTAIL (CATHETERS) ×3 IMPLANT
CATH INFINITI 5FR JL4 (CATHETERS) ×3 IMPLANT
CATH INFINITI JR4 5F (CATHETERS) ×3 IMPLANT
DEVICE CLOSURE MYNXGRIP 5F (Vascular Products) ×3 IMPLANT
KIT MANI 3VAL PERCEP (MISCELLANEOUS) ×3 IMPLANT
NEEDLE PERC 18GX7CM (NEEDLE) ×3 IMPLANT
PACK CARDIAC CATH (CUSTOM PROCEDURE TRAY) ×3 IMPLANT
SHEATH AVANTI 5FR X 11CM (SHEATH) ×3 IMPLANT
WIRE GUIDERIGHT .035X150 (WIRE) ×3 IMPLANT

## 2018-01-11 DIAGNOSIS — M1711 Unilateral primary osteoarthritis, right knee: Secondary | ICD-10-CM | POA: Diagnosis not present

## 2018-01-16 DIAGNOSIS — M1711 Unilateral primary osteoarthritis, right knee: Secondary | ICD-10-CM | POA: Diagnosis not present

## 2018-01-25 DIAGNOSIS — I10 Essential (primary) hypertension: Secondary | ICD-10-CM | POA: Diagnosis not present

## 2018-01-25 DIAGNOSIS — G4733 Obstructive sleep apnea (adult) (pediatric): Secondary | ICD-10-CM | POA: Diagnosis not present

## 2018-01-25 DIAGNOSIS — I25118 Atherosclerotic heart disease of native coronary artery with other forms of angina pectoris: Secondary | ICD-10-CM | POA: Diagnosis not present

## 2018-01-25 DIAGNOSIS — E782 Mixed hyperlipidemia: Secondary | ICD-10-CM | POA: Diagnosis not present

## 2018-02-14 DIAGNOSIS — M1711 Unilateral primary osteoarthritis, right knee: Secondary | ICD-10-CM | POA: Diagnosis not present

## 2018-03-15 DIAGNOSIS — E559 Vitamin D deficiency, unspecified: Secondary | ICD-10-CM | POA: Diagnosis not present

## 2018-03-15 DIAGNOSIS — E118 Type 2 diabetes mellitus with unspecified complications: Secondary | ICD-10-CM | POA: Diagnosis not present

## 2018-03-15 DIAGNOSIS — C73 Malignant neoplasm of thyroid gland: Secondary | ICD-10-CM | POA: Diagnosis not present

## 2018-03-15 DIAGNOSIS — L308 Other specified dermatitis: Secondary | ICD-10-CM | POA: Diagnosis not present

## 2018-03-15 DIAGNOSIS — L4050 Arthropathic psoriasis, unspecified: Secondary | ICD-10-CM | POA: Diagnosis not present

## 2018-03-15 DIAGNOSIS — Z79899 Other long term (current) drug therapy: Secondary | ICD-10-CM | POA: Diagnosis not present

## 2018-03-15 DIAGNOSIS — Z23 Encounter for immunization: Secondary | ICD-10-CM | POA: Diagnosis not present

## 2018-03-15 DIAGNOSIS — M150 Primary generalized (osteo)arthritis: Secondary | ICD-10-CM | POA: Diagnosis not present

## 2018-03-15 DIAGNOSIS — E538 Deficiency of other specified B group vitamins: Secondary | ICD-10-CM | POA: Diagnosis not present

## 2018-03-22 DIAGNOSIS — E782 Mixed hyperlipidemia: Secondary | ICD-10-CM | POA: Diagnosis not present

## 2018-03-22 DIAGNOSIS — I10 Essential (primary) hypertension: Secondary | ICD-10-CM | POA: Diagnosis not present

## 2018-03-22 DIAGNOSIS — I25118 Atherosclerotic heart disease of native coronary artery with other forms of angina pectoris: Secondary | ICD-10-CM | POA: Diagnosis not present

## 2018-03-22 DIAGNOSIS — C73 Malignant neoplasm of thyroid gland: Secondary | ICD-10-CM | POA: Diagnosis not present

## 2018-03-22 DIAGNOSIS — E119 Type 2 diabetes mellitus without complications: Secondary | ICD-10-CM | POA: Diagnosis not present

## 2018-04-06 DIAGNOSIS — R600 Localized edema: Secondary | ICD-10-CM | POA: Diagnosis not present

## 2018-04-06 DIAGNOSIS — J0100 Acute maxillary sinusitis, unspecified: Secondary | ICD-10-CM | POA: Diagnosis not present

## 2018-04-06 DIAGNOSIS — L03115 Cellulitis of right lower limb: Secondary | ICD-10-CM | POA: Diagnosis not present

## 2018-04-11 DIAGNOSIS — L4050 Arthropathic psoriasis, unspecified: Secondary | ICD-10-CM | POA: Diagnosis not present

## 2018-04-11 DIAGNOSIS — R600 Localized edema: Secondary | ICD-10-CM | POA: Diagnosis not present

## 2018-04-11 DIAGNOSIS — L03115 Cellulitis of right lower limb: Secondary | ICD-10-CM | POA: Diagnosis not present

## 2018-05-16 ENCOUNTER — Other Ambulatory Visit: Payer: Self-pay

## 2018-05-16 ENCOUNTER — Encounter
Admission: RE | Admit: 2018-05-16 | Discharge: 2018-05-16 | Disposition: A | Discharge status: Home / Self Care | Payer: BLUE CROSS/BLUE SHIELD | Source: Ambulatory Visit | Attending: Orthopedic Surgery | Admitting: Orthopedic Surgery

## 2018-05-16 DIAGNOSIS — Z01818 Encounter for other preprocedural examination: Secondary | ICD-10-CM | POA: Insufficient documentation

## 2018-05-16 DIAGNOSIS — I10 Essential (primary) hypertension: Secondary | ICD-10-CM | POA: Diagnosis not present

## 2018-05-16 HISTORY — DX: Pain in right knee: M25.561

## 2018-05-16 HISTORY — DX: Localized edema: R60.0

## 2018-05-16 HISTORY — DX: Heart failure, unspecified: I50.9

## 2018-05-16 HISTORY — DX: Peripheral vascular disease, unspecified: I73.9

## 2018-05-16 LAB — URINALYSIS, COMPLETE (UACMP) WITH MICROSCOPIC
Bilirubin Urine: NEGATIVE
Glucose, UA: NEGATIVE mg/dL
KETONES UR: NEGATIVE mg/dL
Leukocytes,Ua: NEGATIVE
Nitrite: NEGATIVE
Protein, ur: NEGATIVE mg/dL
SPECIFIC GRAVITY, URINE: 1.013 (ref 1.005–1.030)
pH: 7 (ref 5.0–8.0)

## 2018-05-16 LAB — BASIC METABOLIC PANEL
ANION GAP: 10 (ref 5–15)
BUN: 26 mg/dL — ABNORMAL HIGH (ref 8–23)
CALCIUM: 9.1 mg/dL (ref 8.9–10.3)
CO2: 28 mmol/L (ref 22–32)
CREATININE: 0.84 mg/dL (ref 0.44–1.00)
Chloride: 103 mmol/L (ref 98–111)
Glucose, Bld: 127 mg/dL — ABNORMAL HIGH (ref 70–99)
Potassium: 3.5 mmol/L (ref 3.5–5.1)
SODIUM: 141 mmol/L (ref 135–145)

## 2018-05-16 LAB — CBC
HCT: 38.6 % (ref 36.0–46.0)
Hemoglobin: 12.5 g/dL (ref 12.0–15.0)
MCH: 29.8 pg (ref 26.0–34.0)
MCHC: 32.4 g/dL (ref 30.0–36.0)
MCV: 92.1 fL (ref 80.0–100.0)
NRBC: 0 % (ref 0.0–0.2)
Platelets: 325 10*3/uL (ref 150–400)
RBC: 4.19 MIL/uL (ref 3.87–5.11)
RDW: 15.4 % (ref 11.5–15.5)
WBC: 7 10*3/uL (ref 4.0–10.5)

## 2018-05-16 LAB — SURGICAL PCR SCREEN
MRSA, PCR: NEGATIVE
STAPHYLOCOCCUS AUREUS: NEGATIVE

## 2018-05-16 LAB — PROTIME-INR
INR: 1 (ref 0.8–1.2)
PROTHROMBIN TIME: 13.3 s (ref 11.4–15.2)

## 2018-05-16 LAB — TYPE AND SCREEN
ABO/RH(D): O POS
ANTIBODY SCREEN: NEGATIVE

## 2018-05-16 LAB — SEDIMENTATION RATE: SED RATE: 17 mm/h (ref 0–30)

## 2018-05-16 LAB — APTT: APTT: 28 s (ref 24–36)

## 2018-05-16 NOTE — Patient Instructions (Signed)
Your procedure is scheduled on: 05/29/2018 Tues Report to Same Day Surgery 2nd floor medical mall [facility] Entrance-take elevator on left to 2nd floor.  Check in with surgery information desk.) To find out your arrival time please call [phone] between 1PM - 3PM on 05/29/2018 Mon  Remember: Instructions that are not followed completely may result in serious medical risk, up to and including death, or upon the discretion of your surgeon and anesthesiologist your surgery may need to be rescheduled.    _x___ 1. Do not eat food after midnight the night before your procedure. You may drink clear liquids up to 2 hours before you are scheduled to arrive at the hospital for your procedure.  Do not drink clear liquids within 2 hours of your scheduled arrival to the hospital.  Clear liquids include  --Water or Apple juice without pulp  --Clear carbohydrate beverage such as ClearFast or Gatorade  --Black Coffee or Clear Tea (No milk, no creamers, do not add anything to                  the coffee or Tea Type 1 and type 2 diabetics should only drink water.   ____Ensure clear carbohydrate drink on the way to the hospital for bariatric patients  ____Ensure clear carbohydrate drink 3 hours before surgery for Dr Dwyane Luo patients if physician instructed.   No gum chewing or hard candies.     __x__ 2. No Alcohol for 24 hours before or after surgery.   __x__3. No Smoking or e-cigarettes for 24 prior to surgery.  Do not use any chewable tobacco products for at least 6 hour prior to surgery   ____  4. Bring all medications with you on the day of surgery if instructed.    __x__ 5. Notify your doctor if there is any change in your medical condition     (cold, fever, infections).    x___6. On the morning of surgery brush your teeth with toothpaste and water.  You may rinse your mouth with mouth wash if you wish.  Do not swallow any toothpaste or mouthwash.   Do not wear jewelry, make-up,  hairpins, clips or nail polish.  Do not wear lotions, powders, or perfumes. You may wear deodorant.  Do not shave 48 hours prior to surgery. Men may shave face and neck.  Do not bring valuables to the hospital.    [facility] is not responsible for any belongings or valuables.               Contacts, dentures or bridgework may not be worn into surgery.  Leave your suitcase in the car. After surgery it may be brought to your room.  For patients admitted to the hospital, discharge time is determined by your                       treatment team.  _  Patients discharged the day of surgery will not be allowed to drive home.  You will need someone to drive you home and stay with you the night of your procedure.    Please read over the following fact sheets that you were given:   [facility] Preparing for Surgery and or MRSA Information   _x___ Take anti-hypertensive listed below, cardiac, seizure, asthma,     anti-reflux and psychiatric medicines. These include:  1. albuterol (PROVENTIL HFA;VENTOLIN HFA) 108 (90 Base) MCG/ACT inhaler  2.dexlansoprazole (DEXILANT) 60 MG capsule  3.fluticasone furoate-vilanterol (BREO  ELLIPTA) 100-25 MCG/INH AEPB  4.isosorbide mononitrate (IMDUR) 60 MG 24 hr tablet  5.levothyroxine (SYNTHROID, LEVOTHROID) 100 MCG tablet  6.  ____Fleets enema or Magnesium Citrate as directed.   _x___ Use CHG Soap or sage wipes as directed on instruction sheet   __x__ Use inhalers on the day of surgery and bring to hospital day of surgery  ____ Stop Metformin and Janumet 2 days prior to surgery.    ____ Take 1/2 of usual insulin dose the night before surgery and none on the morning     surgery.   _x___ Follow recommendations from Cardiologist, Pulmonologist or PCP regarding          stopping Aspirin, Coumadin, Plavix ,Eliquis, Effient, or Pradaxa, and Pletal.  X____Stop Anti-inflammatories such as Advil, Aleve, Ibuprofen, Motrin, Naproxen, Naprosyn, Goodies powders or  aspirin products. OK to take Tylenol and                          Celebrex.   _x___ Stop supplements until after surgery.  But may continue Vitamin D, Vitamin B,       and multivitamin.   _x___ Bring C-Pap to the hospital.

## 2018-05-17 LAB — URINE CULTURE: Culture: <10000 — AB

## 2018-05-18 DIAGNOSIS — Z79899 Other long term (current) drug therapy: Secondary | ICD-10-CM | POA: Diagnosis not present

## 2018-05-18 DIAGNOSIS — L4050 Arthropathic psoriasis, unspecified: Secondary | ICD-10-CM | POA: Diagnosis not present

## 2018-05-28 MED ORDER — CLINDAMYCIN PHOSPHATE 900 MG/50ML IV SOLN
900.0000 mg | Freq: Once | INTRAVENOUS | Status: AC
  Administered 2018-05-29: 900 mg via INTRAVENOUS

## 2018-05-29 ENCOUNTER — Encounter
Admission: RE | Disposition: A | Discharge status: Home Health | Payer: Self-pay | Source: Home / Self Care | Attending: Orthopedic Surgery

## 2018-05-29 ENCOUNTER — Inpatient Hospital Stay: Payer: BLUE CROSS/BLUE SHIELD | Admitting: Anesthesiology

## 2018-05-29 ENCOUNTER — Encounter: Payer: Self-pay | Admitting: *Deleted

## 2018-05-29 ENCOUNTER — Inpatient Hospital Stay: Payer: BLUE CROSS/BLUE SHIELD

## 2018-05-29 ENCOUNTER — Inpatient Hospital Stay
Admission: RE | Admit: 2018-05-29 | Discharge: 2018-06-01 | DRG: 470 | Disposition: A | Discharge status: Home Health | Payer: BLUE CROSS/BLUE SHIELD | Attending: Orthopedic Surgery | Admitting: Orthopedic Surgery

## 2018-05-29 ENCOUNTER — Other Ambulatory Visit: Payer: Self-pay

## 2018-05-29 DIAGNOSIS — J449 Chronic obstructive pulmonary disease, unspecified: Secondary | ICD-10-CM | POA: Diagnosis present

## 2018-05-29 DIAGNOSIS — Z6841 Body Mass Index (BMI) 40.0 and over, adult: Secondary | ICD-10-CM | POA: Diagnosis not present

## 2018-05-29 DIAGNOSIS — Z85850 Personal history of malignant neoplasm of thyroid: Secondary | ICD-10-CM | POA: Diagnosis not present

## 2018-05-29 DIAGNOSIS — I252 Old myocardial infarction: Secondary | ICD-10-CM

## 2018-05-29 DIAGNOSIS — I110 Hypertensive heart disease with heart failure: Secondary | ICD-10-CM | POA: Diagnosis not present

## 2018-05-29 DIAGNOSIS — M810 Age-related osteoporosis without current pathological fracture: Secondary | ICD-10-CM | POA: Diagnosis present

## 2018-05-29 DIAGNOSIS — E1151 Type 2 diabetes mellitus with diabetic peripheral angiopathy without gangrene: Secondary | ICD-10-CM | POA: Diagnosis present

## 2018-05-29 DIAGNOSIS — Z7982 Long term (current) use of aspirin: Secondary | ICD-10-CM

## 2018-05-29 DIAGNOSIS — M1711 Unilateral primary osteoarthritis, right knee: Principal | ICD-10-CM | POA: Diagnosis present

## 2018-05-29 DIAGNOSIS — Z96651 Presence of right artificial knee joint: Secondary | ICD-10-CM

## 2018-05-29 DIAGNOSIS — Z888 Allergy status to other drugs, medicaments and biological substances status: Secondary | ICD-10-CM | POA: Diagnosis not present

## 2018-05-29 DIAGNOSIS — I2510 Atherosclerotic heart disease of native coronary artery without angina pectoris: Secondary | ICD-10-CM | POA: Diagnosis present

## 2018-05-29 DIAGNOSIS — Z471 Aftercare following joint replacement surgery: Secondary | ICD-10-CM | POA: Diagnosis not present

## 2018-05-29 DIAGNOSIS — Z79890 Hormone replacement therapy (postmenopausal): Secondary | ICD-10-CM

## 2018-05-29 DIAGNOSIS — G4730 Sleep apnea, unspecified: Secondary | ICD-10-CM | POA: Diagnosis present

## 2018-05-29 DIAGNOSIS — Z885 Allergy status to narcotic agent status: Secondary | ICD-10-CM | POA: Diagnosis not present

## 2018-05-29 DIAGNOSIS — Z79899 Other long term (current) drug therapy: Secondary | ICD-10-CM | POA: Diagnosis not present

## 2018-05-29 DIAGNOSIS — Z880 Allergy status to penicillin: Secondary | ICD-10-CM

## 2018-05-29 DIAGNOSIS — K219 Gastro-esophageal reflux disease without esophagitis: Secondary | ICD-10-CM | POA: Diagnosis present

## 2018-05-29 DIAGNOSIS — M069 Rheumatoid arthritis, unspecified: Secondary | ICD-10-CM | POA: Diagnosis not present

## 2018-05-29 DIAGNOSIS — I509 Heart failure, unspecified: Secondary | ICD-10-CM | POA: Diagnosis present

## 2018-05-29 DIAGNOSIS — E785 Hyperlipidemia, unspecified: Secondary | ICD-10-CM | POA: Diagnosis present

## 2018-05-29 DIAGNOSIS — G8918 Other acute postprocedural pain: Secondary | ICD-10-CM

## 2018-05-29 HISTORY — PX: TOTAL KNEE ARTHROPLASTY: SHX125

## 2018-05-29 LAB — GLUCOSE, CAPILLARY
GLUCOSE-CAPILLARY: 125 mg/dL — AB (ref 70–99)
Glucose-Capillary: 113 mg/dL — ABNORMAL HIGH (ref 70–99)

## 2018-05-29 LAB — ABO/RH: ABO/RH(D): O POS

## 2018-05-29 LAB — CBC
HCT: 34.5 % — ABNORMAL LOW (ref 36.0–46.0)
Hemoglobin: 10.9 g/dL — ABNORMAL LOW (ref 12.0–15.0)
MCH: 30.3 pg (ref 26.0–34.0)
MCHC: 31.6 g/dL (ref 30.0–36.0)
MCV: 95.8 fL (ref 80.0–100.0)
Platelets: 230 10*3/uL (ref 150–400)
RBC: 3.6 MIL/uL — ABNORMAL LOW (ref 3.87–5.11)
RDW: 15.3 % (ref 11.5–15.5)
WBC: 6.7 10*3/uL (ref 4.0–10.5)
nRBC: 0 % (ref 0.0–0.2)

## 2018-05-29 LAB — CREATININE, SERUM: Creatinine, Ser: 0.79 mg/dL (ref 0.44–1.00)

## 2018-05-29 SURGERY — ARTHROPLASTY, KNEE, TOTAL
Anesthesia: Spinal | Laterality: Right

## 2018-05-29 MED ORDER — PANTOPRAZOLE SODIUM 40 MG PO TBEC
40.0000 mg | DELAYED_RELEASE_TABLET | Freq: Every day | ORAL | Status: DC
  Administered 2018-05-29 – 2018-06-01 (×4): 40 mg via ORAL
  Filled 2018-05-29 (×4): qty 1

## 2018-05-29 MED ORDER — OXYCODONE HCL 5 MG PO TABS
5.0000 mg | ORAL_TABLET | ORAL | Status: DC | PRN
  Administered 2018-05-29: 5 mg via ORAL
  Administered 2018-05-29 – 2018-05-30 (×3): 10 mg via ORAL
  Filled 2018-05-29 (×5): qty 2

## 2018-05-29 MED ORDER — ACETAMINOPHEN 325 MG PO TABS: 325.0000 mg | ORAL_TABLET | Freq: Four times a day (QID) | ORAL | Status: DC | PRN

## 2018-05-29 MED ORDER — KETOROLAC TROMETHAMINE 30 MG/ML IJ SOLN
INTRAMUSCULAR | Status: DC | PRN
  Administered 2018-05-29: 30 mg via INTRAMUSCULAR

## 2018-05-29 MED ORDER — TRIAMCINOLONE ACETONIDE 0.1 % EX OINT: 1.0000 "application " | TOPICAL_OINTMENT | Freq: Two times a day (BID) | CUTANEOUS | Status: DC | PRN

## 2018-05-29 MED ORDER — ACETAMINOPHEN 10 MG/ML IV SOLN
INTRAVENOUS | Status: DC | PRN
  Administered 2018-05-29: 1000 mg via INTRAVENOUS

## 2018-05-29 MED ORDER — FENTANYL CITRATE (PF) 100 MCG/2ML IJ SOLN
INTRAMUSCULAR | Status: AC
  Filled 2018-05-29: qty 2

## 2018-05-29 MED ORDER — LIDOCAINE HCL (PF) 2 % IJ SOLN
INTRAMUSCULAR | Status: AC
  Filled 2018-05-29: qty 10

## 2018-05-29 MED ORDER — PROPOFOL 10 MG/ML IV BOLUS
INTRAVENOUS | Status: DC | PRN
  Administered 2018-05-29: 40 mg via INTRAVENOUS
  Administered 2018-05-29: 20 mg via INTRAVENOUS

## 2018-05-29 MED ORDER — METHOCARBAMOL 500 MG PO TABS
500.0000 mg | ORAL_TABLET | Freq: Four times a day (QID) | ORAL | Status: DC | PRN
  Administered 2018-05-30 – 2018-06-01 (×4): 500 mg via ORAL
  Filled 2018-05-29 (×4): qty 1

## 2018-05-29 MED ORDER — PROPOFOL 500 MG/50ML IV EMUL
INTRAVENOUS | Status: DC | PRN
  Administered 2018-05-29: 50 ug/kg/min via INTRAVENOUS

## 2018-05-29 MED ORDER — FENTANYL CITRATE (PF) 100 MCG/2ML IJ SOLN
INTRAMUSCULAR | Status: DC | PRN
  Administered 2018-05-29: 50 ug via INTRAVENOUS
  Administered 2018-05-29 (×2): 25 ug via INTRAVENOUS

## 2018-05-29 MED ORDER — PROPOFOL 500 MG/50ML IV EMUL
INTRAVENOUS | Status: AC
  Filled 2018-05-29: qty 50

## 2018-05-29 MED ORDER — PHENOL 1.4 % MT LIQD: 1.0000 | OROMUCOSAL | Status: DC | PRN

## 2018-05-29 MED ORDER — FLUTICASONE FUROATE-VILANTEROL 100-25 MCG/INH IN AEPB
1.0000 | INHALATION_SPRAY | Freq: Every day | RESPIRATORY_TRACT | Status: DC
  Administered 2018-05-30 – 2018-06-01 (×3): 1 via RESPIRATORY_TRACT
  Filled 2018-05-29: qty 28

## 2018-05-29 MED ORDER — METHOCARBAMOL 1000 MG/10ML IJ SOLN
500.0000 mg | Freq: Four times a day (QID) | INTRAVENOUS | Status: DC | PRN
  Filled 2018-05-29: qty 5

## 2018-05-29 MED ORDER — DOCUSATE SODIUM 100 MG PO CAPS
100.0000 mg | ORAL_CAPSULE | Freq: Two times a day (BID) | ORAL | Status: DC
  Administered 2018-05-29 – 2018-06-01 (×6): 100 mg via ORAL
  Filled 2018-05-29 (×7): qty 1

## 2018-05-29 MED ORDER — ZOLPIDEM TARTRATE 5 MG PO TABS: 5.0000 mg | ORAL_TABLET | Freq: Every evening | ORAL | Status: DC | PRN

## 2018-05-29 MED ORDER — LIDOCAINE HCL (CARDIAC) PF 100 MG/5ML IV SOSY
PREFILLED_SYRINGE | INTRAVENOUS | Status: DC | PRN
  Administered 2018-05-29: 80 mg via INTRAVENOUS

## 2018-05-29 MED ORDER — COQ-10 100 MG PO CAPS: 100.0000 mg | ORAL_CAPSULE | Freq: Every day | ORAL | Status: DC

## 2018-05-29 MED ORDER — ACETAMINOPHEN 500 MG PO TABS
1000.0000 mg | ORAL_TABLET | Freq: Four times a day (QID) | ORAL | Status: AC
  Administered 2018-05-29 – 2018-05-30 (×4): 1000 mg via ORAL
  Filled 2018-05-29 (×4): qty 2

## 2018-05-29 MED ORDER — ASPIRIN EC 81 MG PO TBEC
81.0000 mg | DELAYED_RELEASE_TABLET | Freq: Every day | ORAL | Status: DC
  Administered 2018-05-29 – 2018-06-01 (×4): 81 mg via ORAL
  Filled 2018-05-29 (×4): qty 1

## 2018-05-29 MED ORDER — FENTANYL CITRATE (PF) 100 MCG/2ML IJ SOLN: 25.0000 ug | INTRAMUSCULAR | Status: DC | PRN

## 2018-05-29 MED ORDER — LOSARTAN POTASSIUM 50 MG PO TABS
100.0000 mg | ORAL_TABLET | Freq: Every day | ORAL | Status: DC
  Administered 2018-05-30 – 2018-06-01 (×3): 100 mg via ORAL
  Filled 2018-05-29 (×3): qty 2

## 2018-05-29 MED ORDER — ONDANSETRON HCL 4 MG PO TABS
4.0000 mg | ORAL_TABLET | Freq: Four times a day (QID) | ORAL | Status: DC | PRN
  Filled 2018-05-29: qty 1

## 2018-05-29 MED ORDER — CLINDAMYCIN PHOSPHATE 900 MG/50ML IV SOLN
900.0000 mg | Freq: Four times a day (QID) | INTRAVENOUS | Status: AC
  Administered 2018-05-29 – 2018-05-30 (×3): 900 mg via INTRAVENOUS
  Filled 2018-05-29 (×3): qty 50

## 2018-05-29 MED ORDER — SIMVASTATIN 20 MG PO TABS
20.0000 mg | ORAL_TABLET | Freq: Every day | ORAL | Status: DC
  Administered 2018-05-29 – 2018-05-31 (×3): 20 mg via ORAL
  Filled 2018-05-29 (×3): qty 1

## 2018-05-29 MED ORDER — MENTHOL 3 MG MT LOZG: 1.0000 | LOZENGE | OROMUCOSAL | Status: DC | PRN

## 2018-05-29 MED ORDER — OXYCODONE HCL 5 MG PO TABS
10.0000 mg | ORAL_TABLET | ORAL | Status: DC | PRN
  Administered 2018-05-30: 15 mg via ORAL
  Administered 2018-05-30: 10 mg via ORAL
  Administered 2018-05-31 – 2018-06-01 (×6): 15 mg via ORAL
  Filled 2018-05-29 (×8): qty 3

## 2018-05-29 MED ORDER — SODIUM CHLORIDE 0.9 % IV SOLN
INTRAVENOUS | Status: DC
  Administered 2018-05-29 – 2018-05-30 (×3): via INTRAVENOUS

## 2018-05-29 MED ORDER — ALBUTEROL SULFATE (2.5 MG/3ML) 0.083% IN NEBU: 2.5000 mg | INHALATION_SOLUTION | Freq: Four times a day (QID) | RESPIRATORY_TRACT | Status: DC | PRN

## 2018-05-29 MED ORDER — SECUKINUMAB 150 MG/ML ~~LOC~~ SOAJ: 300.0000 mg | SUBCUTANEOUS | Status: DC

## 2018-05-29 MED ORDER — BUPIVACAINE HCL (PF) 0.5 % IJ SOLN
INTRAMUSCULAR | Status: DC | PRN
  Administered 2018-05-29: 3 mL

## 2018-05-29 MED ORDER — SODIUM CHLORIDE 0.9 % IV SOLN
INTRAVENOUS | Status: DC | PRN
  Administered 2018-05-29: 60 mL

## 2018-05-29 MED ORDER — ALBUTEROL SULFATE HFA 108 (90 BASE) MCG/ACT IN AERS: 2.0000 | INHALATION_SPRAY | Freq: Four times a day (QID) | RESPIRATORY_TRACT | Status: DC | PRN

## 2018-05-29 MED ORDER — ONDANSETRON HCL 4 MG/2ML IJ SOLN: 4.0000 mg | Freq: Once | INTRAMUSCULAR | Status: DC | PRN

## 2018-05-29 MED ORDER — LEVOTHYROXINE SODIUM 100 MCG PO TABS
100.0000 ug | ORAL_TABLET | Freq: Every day | ORAL | Status: DC
  Administered 2018-05-30 – 2018-06-01 (×3): 100 ug via ORAL
  Filled 2018-05-29 (×3): qty 1

## 2018-05-29 MED ORDER — METOCLOPRAMIDE HCL 5 MG/ML IJ SOLN: 5.0000 mg | Freq: Three times a day (TID) | INTRAMUSCULAR | Status: DC | PRN

## 2018-05-29 MED ORDER — ISOSORBIDE MONONITRATE ER 60 MG PO TB24
60.0000 mg | ORAL_TABLET | Freq: Every day | ORAL | Status: DC
  Administered 2018-05-30 – 2018-06-01 (×3): 60 mg via ORAL
  Filled 2018-05-29 (×3): qty 1

## 2018-05-29 MED ORDER — ACETAMINOPHEN 10 MG/ML IV SOLN
INTRAVENOUS | Status: AC
  Filled 2018-05-29: qty 100

## 2018-05-29 MED ORDER — ENOXAPARIN SODIUM 40 MG/0.4ML ~~LOC~~ SOLN
40.0000 mg | Freq: Two times a day (BID) | SUBCUTANEOUS | Status: DC
  Administered 2018-05-30 – 2018-06-01 (×5): 40 mg via SUBCUTANEOUS
  Filled 2018-05-29 (×5): qty 0.4

## 2018-05-29 MED ORDER — ONDANSETRON HCL 4 MG/2ML IJ SOLN
4.0000 mg | Freq: Four times a day (QID) | INTRAMUSCULAR | Status: DC | PRN
  Administered 2018-05-30: 4 mg via INTRAVENOUS
  Filled 2018-05-29: qty 2

## 2018-05-29 MED ORDER — HYDROMORPHONE HCL 1 MG/ML IJ SOLN: 0.5000 mg | INTRAMUSCULAR | Status: DC | PRN

## 2018-05-29 MED ORDER — MAGNESIUM CITRATE PO SOLN: 1.0000 | Freq: Once | ORAL | Status: DC | PRN

## 2018-05-29 MED ORDER — NEOMYCIN-POLYMYXIN B GU 40-200000 IR SOLN
Status: DC | PRN
  Administered 2018-05-29: 16 mL

## 2018-05-29 MED ORDER — SODIUM CHLORIDE 0.9 % IV SOLN
INTRAVENOUS | Status: DC
  Administered 2018-05-29: 07:00:00 via INTRAVENOUS

## 2018-05-29 MED ORDER — MAGNESIUM HYDROXIDE 400 MG/5ML PO SUSP
30.0000 mL | Freq: Every day | ORAL | Status: DC | PRN
  Administered 2018-05-30: 30 mL via ORAL
  Filled 2018-05-29: qty 30

## 2018-05-29 MED ORDER — BISACODYL 5 MG PO TBEC
5.0000 mg | DELAYED_RELEASE_TABLET | Freq: Every day | ORAL | Status: DC | PRN
  Administered 2018-05-30: 5 mg via ORAL
  Filled 2018-05-29: qty 1

## 2018-05-29 MED ORDER — ALUM & MAG HYDROXIDE-SIMETH 200-200-20 MG/5ML PO SUSP
30.0000 mL | ORAL | Status: DC | PRN
  Administered 2018-05-31: 30 mL via ORAL
  Filled 2018-05-29: qty 30

## 2018-05-29 MED ORDER — MIDAZOLAM HCL 5 MG/5ML IJ SOLN
INTRAMUSCULAR | Status: DC | PRN
  Administered 2018-05-29 (×2): 1 mg via INTRAVENOUS

## 2018-05-29 MED ORDER — SODIUM CHLORIDE 0.9 % IV SOLN
INTRAVENOUS | Status: DC | PRN
  Administered 2018-05-29: 50 ug/min via INTRAVENOUS

## 2018-05-29 MED ORDER — LORATADINE 10 MG PO TABS
10.0000 mg | ORAL_TABLET | Freq: Every day | ORAL | Status: DC
  Administered 2018-05-30 – 2018-06-01 (×3): 10 mg via ORAL
  Filled 2018-05-29 (×3): qty 1

## 2018-05-29 MED ORDER — BUPIVACAINE-EPINEPHRINE (PF) 0.25% -1:200000 IJ SOLN
INTRAMUSCULAR | Status: DC | PRN
  Administered 2018-05-29: 30 mL

## 2018-05-29 MED ORDER — DIPHENHYDRAMINE HCL 12.5 MG/5ML PO ELIX: 12.5000 mg | ORAL_SOLUTION | ORAL | Status: DC | PRN

## 2018-05-29 MED ORDER — MORPHINE SULFATE 10 MG/ML IJ SOLN
INTRAMUSCULAR | Status: DC | PRN
  Administered 2018-05-29: 10 mg via SUBCUTANEOUS

## 2018-05-29 MED ORDER — MIDAZOLAM HCL 2 MG/2ML IJ SOLN
INTRAMUSCULAR | Status: AC
  Filled 2018-05-29: qty 2

## 2018-05-29 MED ORDER — TRAMADOL HCL 50 MG PO TABS
50.0000 mg | ORAL_TABLET | Freq: Four times a day (QID) | ORAL | Status: DC
  Administered 2018-05-29 – 2018-06-01 (×12): 50 mg via ORAL
  Filled 2018-05-29 (×13): qty 1

## 2018-05-29 MED ORDER — HYDROCHLOROTHIAZIDE 25 MG PO TABS
25.0000 mg | ORAL_TABLET | Freq: Every day | ORAL | Status: DC
  Administered 2018-05-30 – 2018-06-01 (×3): 25 mg via ORAL
  Filled 2018-05-29 (×3): qty 1

## 2018-05-29 MED ORDER — METOCLOPRAMIDE HCL 10 MG PO TABS: 5.0000 mg | ORAL_TABLET | Freq: Three times a day (TID) | ORAL | Status: DC | PRN

## 2018-05-29 MED ORDER — CLINDAMYCIN PHOSPHATE 900 MG/50ML IV SOLN
INTRAVENOUS | Status: AC
  Filled 2018-05-29: qty 50

## 2018-05-29 SURGICAL SUPPLY — 64 items
BANDAGE ACE 6X5 VEL STRL LF (GAUZE/BANDAGES/DRESSINGS) ×3 IMPLANT
BLADE SAW 1 (BLADE) ×3 IMPLANT
CANISTER SUCT 1200ML W/VALVE (MISCELLANEOUS) ×3 IMPLANT
CANISTER SUCT 3000ML PPV (MISCELLANEOUS) ×6 IMPLANT
CANISTER WOUND CARE 500ML ATS (WOUND CARE) ×3 IMPLANT
CEMENT HV SMART SET (Cement) ×6 IMPLANT
CHLORAPREP W/TINT 26 (MISCELLANEOUS) ×6 IMPLANT
COOLER POLAR GLACIER W/PUMP (MISCELLANEOUS) ×3 IMPLANT
COVER WAND RF STERILE (DRAPES) ×3 IMPLANT
CUFF TOURN SGL QUICK 24 (TOURNIQUET CUFF)
CUFF TOURN SGL QUICK 30 (TOURNIQUET CUFF)
CUFF TRNQT CYL 24X4X16.5-23 (TOURNIQUET CUFF) IMPLANT
CUFF TRNQT CYL 30X4X21-28X (TOURNIQUET CUFF) IMPLANT
DRAPE SHEET LG 3/4 BI-LAMINATE (DRAPES) ×6 IMPLANT
DRILL BIT 4.8/6.5MM (BIT) ×3 IMPLANT
ELECT CAUTERY BLADE 6.4 (BLADE) ×3 IMPLANT
ELECT REM PT RETURN 9FT ADLT (ELECTROSURGICAL) ×3
ELECTRODE REM PT RTRN 9FT ADLT (ELECTROSURGICAL) ×1 IMPLANT
FEMORAL COMP SZ4 RIGHT SPHERE (Femur) ×3 IMPLANT
GAUZE PETRO XEROFOAM 1X8 (MISCELLANEOUS) ×3 IMPLANT
GAUZE SPONGE 4X4 12PLY STRL (GAUZE/BANDAGES/DRESSINGS) ×3 IMPLANT
GLOVE BIOGEL PI IND STRL 9 (GLOVE) ×1 IMPLANT
GLOVE BIOGEL PI INDICATOR 9 (GLOVE) ×2
GLOVE INDICATOR 8.0 STRL GRN (GLOVE) ×3 IMPLANT
GLOVE SURG ORTHO 8.0 STRL STRW (GLOVE) ×3 IMPLANT
GLOVE SURG SYN 9.0  PF PI (GLOVE) ×2
GLOVE SURG SYN 9.0 PF PI (GLOVE) ×1 IMPLANT
GOWN SRG 2XL LVL 4 RGLN SLV (GOWNS) ×1 IMPLANT
GOWN STRL NON-REIN 2XL LVL4 (GOWNS) ×2
GOWN STRL REUS W/ TWL LRG LVL3 (GOWN DISPOSABLE) ×1 IMPLANT
GOWN STRL REUS W/ TWL XL LVL3 (GOWN DISPOSABLE) ×1 IMPLANT
GOWN STRL REUS W/TWL LRG LVL3 (GOWN DISPOSABLE) ×2
GOWN STRL REUS W/TWL XL LVL3 (GOWN DISPOSABLE) ×2
HOLDER FOLEY CATH W/STRAP (MISCELLANEOUS) ×3 IMPLANT
HOOD PEEL AWAY FLYTE STAYCOOL (MISCELLANEOUS) ×6 IMPLANT
INSERT TIBIAL SZ4 RIGHT 10MM (Insert) ×3 IMPLANT
KIT TURNOVER KIT A (KITS) ×3 IMPLANT
NDL SAFETY ECLIPSE 18X1.5 (NEEDLE) ×1 IMPLANT
NEEDLE HYPO 18GX1.5 SHARP (NEEDLE) ×2
NEEDLE SPNL 18GX3.5 QUINCKE PK (NEEDLE) ×3 IMPLANT
NEEDLE SPNL 20GX3.5 QUINCKE YW (NEEDLE) ×3 IMPLANT
NS IRRIG 1000ML POUR BTL (IV SOLUTION) ×3 IMPLANT
PACK TOTAL KNEE (MISCELLANEOUS) ×3 IMPLANT
PAD WRAPON POLAR KNEE (MISCELLANEOUS) ×1 IMPLANT
PATELLA RESURFACING MEDACTA SZ (Bone Implant) ×3 IMPLANT
PULSAVAC PLUS IRRIG FAN TIP (DISPOSABLE) ×3
SCALPEL PROTECTED #10 DISP (BLADE) ×6 IMPLANT
SOL .9 NS 3000ML IRR  AL (IV SOLUTION) ×2
SOL .9 NS 3000ML IRR UROMATIC (IV SOLUTION) ×1 IMPLANT
STAPLER SKIN PROX 35W (STAPLE) ×3 IMPLANT
STEM EXTENSION 11MMX30MM (Stem) ×3 IMPLANT
SUCTION FRAZIER HANDLE 10FR (MISCELLANEOUS) ×2
SUCTION TUBE FRAZIER 10FR DISP (MISCELLANEOUS) ×1 IMPLANT
SUT DVC 2 QUILL PDO  T11 36X36 (SUTURE) ×2
SUT DVC 2 QUILL PDO T11 36X36 (SUTURE) ×1 IMPLANT
SUT V-LOC 90 ABS DVC 3-0 CL (SUTURE) ×3 IMPLANT
SYR 20CC LL (SYRINGE) ×3 IMPLANT
SYR 50ML LL SCALE MARK (SYRINGE) ×6 IMPLANT
TIBIAL TRAY FIXED 4 MEDACTA 02 (Joint) ×3 IMPLANT
TIP FAN IRRIG PULSAVAC PLUS (DISPOSABLE) ×1 IMPLANT
TOWEL OR 17X26 4PK STRL BLUE (TOWEL DISPOSABLE) ×3 IMPLANT
TOWER CARTRIDGE SMART MIX (DISPOSABLE) ×3 IMPLANT
TRAY FOLEY MTR SLVR 16FR STAT (SET/KITS/TRAYS/PACK) ×3 IMPLANT
WRAPON POLAR PAD KNEE (MISCELLANEOUS) ×3

## 2018-05-29 NOTE — Op Note (Signed)
05/29/2018  9:19 AM  PATIENT:  Jacqueline Rhodes  63 y.o. female  PRE-OPERATIVE DIAGNOSIS:  PRIMARY OSTEOARTHRITIS OF RIGHT KNEE  POST-OPERATIVE DIAGNOSIS:  osteoarthritis right knee  PROCEDURE:  Procedure(s): TOTAL KNEE ARTHROPLASTY-RIGHT (Right)  SURGEON: Laurene Footman, MD  ASSISTANTS: Rachelle Hora, PA-C  ANESTHESIA:   spinal  EBL:  Total I/O In: 500 [I.V.:500] Out: 0   BLOOD ADMINISTERED:none  DRAINS: none   LOCAL MEDICATIONS USED:  MARCAINE   morphine Exparel and Toradol  SPECIMEN:  No Specimen  DISPOSITION OF SPECIMEN:  N/A  COUNTS:  YES  TOURNIQUET:   58 minutes at 300 mmHg  IMPLANTS: Medacta GMK sphere system, 4 right femur, 4 right tibia short stem, 10 mm insert and 3 patella, all components cemented  DICTATION: .Dragon Dictation patient was brought to the operating room and after adequate spinal anesthesia was obtained, the right leg was prepped and draped in the usual sterile fashion.  After patient identification and timeout procedures were completed, tourniquet was raised and midline skin incision carried out.  This was carried out followed by medial parapatellar arthrotomy.  Inspection revealed eburnated bone in the medial compartment both tibia and femoral condyles with significant bone loss advanced patellofemoral degenerative changes with bone exposed and moderate lateral compartment arthritis.  The fat pad and ACL were excised and PCL released.  The proximal tibia alignment guide was placed and proximal tibia cut carried out using this.  Next the distal femoral drill hole was placed in a 6 degree distal cut made on the femur.  The 10 mm gap guide fit well.  The guide was placed for the distal femur and 3 degree external rotation alignment obtained and this was sized as a 4.  Anterior posterior and chamfer cuts made with a 4 cutting guide.  The residual horns of the menisci were excised at this time and the proximal tibia sized to a size 4.  Proximal tibial  preparation was carried out and keel punch placed.  The femoral trial was applied with distal femoral drill holes made after a 10 mm insert gave excellent stability.  The trochlear groove cut was then cut using the router.  These trials were removed and the patella cut using the patellar cutting guide and sized to a size 3:03 drill holes have been made.  At this point the above local was infiltrated in the periarticular tissues and the bony surfaces thoroughly irrigated and dried.  The tibial component was cemented into place first followed by the polyethylene component with the set screw with a torque screwdriver.  The femoral component was placed and the knee held in extension as the cement set with the patellar button clamped in place.  After the cemented set excess cement was removed and the knee irrigated with pulsatile lavage.  The tourniquet was let down at the close of the case and hemostasis checked electrocautery.  The arthrotomy was repaired using a heavy Quill.  3-0 v-LOC subcutaneous closure followed by skin staples incisional wound VAC applied followed by an Ace wrap because of edema that was pre-existing.  PLAN OF CARE: Admit to inpatient   PATIENT DISPOSITION:  PACU - hemodynamically stable.

## 2018-05-29 NOTE — Progress Notes (Signed)
PHARMACIST - PHYSICIAN ORDER COMMUNICATION  CONCERNING: P&T Medication Policy on Herbal Medications  DESCRIPTION:  This patient's order for:  Co-Q-10  has been noted.  This product(s) is classified as an "herbal" or natural product. Due to a lack of definitive safety studies or FDA approval, nonstandard manufacturing practices, plus the potential risk of unknown drug-drug interactions while on inpatient medications, the Pharmacy and Therapeutics Committee does not permit the use of "herbal" or natural products of this type within [facility].   ACTION TAKEN: The pharmacy department is unable to verify this order at this time. Please reevaluate patient's clinical condition at discharge and address if the herbal or natural product(s) should be resumed at that time.   

## 2018-05-29 NOTE — Care Management (Signed)
Sent request to CMA bucket via inbox to check price of Lovenox

## 2018-05-29 NOTE — Anesthesia Preprocedure Evaluation (Signed)
Anesthesia Evaluation  Patient identified by MRN, date of birth, ID band Patient awake    Reviewed: Allergy & Precautions, H&P , NPO status , Patient's Chart, lab work & pertinent test results, reviewed documented beta blocker date and time   History of Anesthesia Complications Negative for: history of anesthetic complications  Airway Mallampati: II  TM Distance: >3 FB Neck ROM: full    Dental  (+) Dental Advidsory Given, Edentulous Upper, Edentulous Lower, Upper Dentures   Pulmonary shortness of breath and with exertion, sleep apnea and Continuous Positive Airway Pressure Ventilation , COPD,  COPD inhaler, neg recent URI, former smoker,           Cardiovascular Exercise Tolerance: Good hypertension, (-) angina+ CAD, + Past MI, + Peripheral Vascular Disease and +CHF  (-) Cardiac Stents and (-) CABG (-) dysrhythmias (-) Valvular Problems/Murmurs     Neuro/Psych negative neurological ROS  negative psych ROS   GI/Hepatic Neg liver ROS, GERD  ,  Endo/Other  diabetesHypothyroidism   Renal/GU negative Renal ROS  negative genitourinary   Musculoskeletal   Abdominal   Peds  Hematology  (+) Blood dyscrasia, anemia ,   Anesthesia Other Findings Past Medical History: No date: Anemia No date: Arthritis No date: CHF (congestive heart failure) ([place])     Comment:  2006 No date: Collagen vascular disease (HCC) No date: COPD (chronic obstructive pulmonary disease) (HCC) No date: Coronary artery disease No date: Diabetes mellitus without complication (HCC) No date: GERD (gastroesophageal reflux disease) No date: Hyperlipidemia No date: Hypertension No date: Knee pain, right No date: Lower extremity edema No date: Lymphedema No date: Peripheral vascular disease (HCC) No date: RA (rheumatoid arthritis) ([place]) No date: Sleep apnea No date: Thyroid cancer (HCC) No date: Thyroid cancer ([place])   Reproductive/Obstetrics negative  OB ROS                             Anesthesia Physical Anesthesia Plan  ASA: III  Anesthesia Plan: Spinal   Post-op Pain Management:    Induction:   PONV Risk Score and Plan: 2 and Propofol infusion and TIVA  Airway Management Planned: Simple Face Mask and Natural Airway  Additional Equipment:   Intra-op Plan:   Post-operative Plan:   Informed Consent: I have reviewed the patients History and Physical, chart, labs and discussed the procedure including the risks, benefits and alternatives for the proposed anesthesia with the patient or authorized representative who has indicated his/her understanding and acceptance.     Dental Advisory Given  Plan Discussed with: Anesthesiologist, CRNA and Surgeon  Anesthesia Plan Comments:         Anesthesia Quick Evaluation

## 2018-05-29 NOTE — TOC Initial Note (Signed)
Transition of Care (TOC) - Initial/Assessment Note   Met with the patient to discuss DC needs and plan,  The patient lives with husband, daughter lives next door and sisiter is a couple doors down.  The patient has a RW and 3 in 1 at home and does not need more DME, Husband to bring Gilford Rile so PT can adjust if neded Patient provided the [facility] list of choice and chose to use [facility], I notified Helene Kelp thru secure email Patient uses [place] [facility] [place] and has Dr. Carrie Mew as PCP Patient has transportation with the family No other needs at this time   Patient Details  Name: Jacqueline Rhodes MRN: [id number] Date of Birth: 11-Oct-1955  Transition of Care [facility]) CM/SW Contact:    Su Hilt, RN Phone Number: 05/29/2018, 12:10 PM  Clinical Narrative:                   Expected Discharge Plan: [place] Barriers to Discharge: Continued Medical Work up   Patient Goals and CMS Choice Patient states their goals for this hospitalization and ongoing recovery are:: go home CMS Medicare.gov Compare Post Acute Care list provided to:: Patient Choice offered to / list presented to : Patient  Expected Discharge Plan and Services Expected Discharge Plan: [place] Discharge Planning Services: CM Consult Post Acute Care Choice: [place] arrangements for the past 2 months: Mobile Home(has a ramp)                 DME Arranged: (none needed)   HH Arranged: PT [facility] Agency: [facility] (now [facility])  Prior Living Arrangements/Services Living arrangements for the past 2 months: Mobile Home(has a ramp) Lives with:: Spouse, Adult Children, Siblings Patient language and need for interpreter reviewed:: Yes Do you feel safe going back to the place where you live?: Yes      Need for Family Participation in Patient Care: No (Comment) Care giver support system in place?: Yes (comment) Current home services: DME(has a RW and 3 in 1 at  home) Criminal Activity/Legal Involvement Pertinent to Current Situation/Hospitalization: No - Comment as needed  Activities of Daily Living Home Assistive Devices/Equipment: Environmental consultant (specify type), Eyeglasses, Dentures (specify type) ADL Screening (condition at time of admission) Patient's cognitive ability adequate to safely complete daily activities?: Yes Is the patient deaf or have difficulty hearing?: No Does the patient have difficulty seeing, even when wearing glasses/contacts?: No Does the patient have difficulty concentrating, remembering, or making decisions?: No Patient able to express need for assistance with ADLs?: Yes Does the patient have difficulty dressing or bathing?: No Independently performs ADLs?: Yes (appropriate for developmental age) Does the patient have difficulty walking or climbing stairs?: Yes Weakness of Legs: Both Weakness of Arms/Hands: Both  Permission Sought/Granted Permission sought to share information with : Case Manager Permission granted to share information with : Yes, Verbal Permission Granted     Permission granted to share info w AGENCY: [facility]        Emotional Assessment Appearance:: Appears stated age Attitude/Demeanor/Rapport: Engaged Affect (typically observed): Accepting Orientation: : Oriented to Self, Oriented to Place, Oriented to  Time, Oriented to Situation Alcohol / Substance Use: Never Used Psych Involvement: No (comment)  Admission diagnosis:  PRIMARY OSTEOARTHRITIS OF RIGHT KNEE Patient Active Problem List   Diagnosis Date Noted  . S/P TKR (total knee replacement) using cement, right 05/29/2018  . Angina decubitus ([place]) 01/04/2018  . Chronic venous insufficiency  07/27/2017  . Lymphedema 07/27/2017  . Diabetes mellitus without complication ([place]) 92/44/6286  . Hypertension 06/16/2017  . Pain in limb 06/16/2017  . Swelling of limb 06/16/2017   PCP:  Marinda Elk, MD Pharmacy:   [place],  [place] - [address] [place] [address] Phone: [phone] Fax: [phone]     Social Determinants of Health (SDOH) Interventions    Readmission Risk Interventions 30 Day Unplanned Readmission Risk Score     Admission (Current) from 05/29/2018 in [place] (1A)  30 Day Unplanned Readmission Risk Score (%)  9 Filed at 05/29/2018 1200     This score is the patient's risk of an unplanned readmission within 30 days of being discharged (0 -100%). The score is based on dignosis, age, lab data, medications, orders, and past utilization.   Low:  0-14.9   Medium: 15-21.9   High: 22-29.9   Extreme: 30 and above       No flowsheet data found.

## 2018-05-29 NOTE — Transfer of Care (Signed)
Immediate Anesthesia Transfer of Care Note  Patient: Jacqueline Rhodes  Procedure(s) Performed: TOTAL KNEE ARTHROPLASTY-RIGHT (Right )  Patient Location: PACU  Anesthesia Type:Spinal  Level of Consciousness: awake, alert  and oriented  Airway & Oxygen Therapy: Patient Spontanous Breathing and Patient connected to face mask oxygen  Post-op Assessment: Report given to RN and Post -op Vital signs reviewed and stable  Post vital signs: Reviewed and stable  Last Vitals:  Vitals Value Taken Time  BP 97/49 05/29/2018  9:18 AM  Temp 36.3 C 05/29/2018  9:15 AM  Pulse 83 05/29/2018  9:19 AM  Resp 18 05/29/2018  9:19 AM  SpO2 97 % 05/29/2018  9:19 AM  Vitals shown include unvalidated device data.  Last Pain:  Vitals:   05/29/18 0915  TempSrc: Temporal  PainSc:          Complications: No apparent anesthesia complications

## 2018-05-29 NOTE — Anesthesia Procedure Notes (Signed)
Date/Time: 05/29/2018 7:30 AM Performed by: Johnna Acosta, CRNA Pre-anesthesia Checklist: Patient identified, Emergency Drugs available, Suction available, Patient being monitored and Timeout performed Patient Re-evaluated:Patient Re-evaluated prior to induction Oxygen Delivery Method: Nasal cannula Preoxygenation: Pre-oxygenation with 100% oxygen

## 2018-05-29 NOTE — TOC Benefit Eligibility Note (Signed)
Transition of Care [facility]) Benefit Eligibility Note    Patient Details  Name: OLUWADARA GORMAN MRN: [id number] Date of Birth: [birth year]   Medication/Dose: Enoxaparin 40 mg daily x 14 days   Covered?: Yes  Tier: 2 Drug  Prescription Coverage Preferred Pharmacy: Any In-Network Pharmacy  Spoke with Person/Company/Phone Number:: Nile Riggs, [phone]  Co-Pay: $40  Prior Approval: No  Deductible: Met  Additional Notes: Lovenox not on formulary    [place] Phone Number: 05/29/2018, 1:42 PM

## 2018-05-29 NOTE — Care Management (Signed)
Jacqueline Rhodes with [facility] emailed and accepted the patient

## 2018-05-29 NOTE — Anesthesia Procedure Notes (Signed)
Spinal  Patient location during procedure: OR Start time: 05/29/2018 7:22 AM End time: 05/29/2018 7:30 AM Staffing Anesthesiologist: Martha Clan, MD Resident/CRNA: Johnna Acosta, CRNA Performed: resident/CRNA  Preanesthetic Checklist Completed: patient identified, site marked, surgical consent, pre-op evaluation, timeout performed, IV checked, risks and benefits discussed and monitors and equipment checked Spinal Block Patient position: sitting Prep: ChloraPrep Patient monitoring: heart rate, continuous pulse ox, blood pressure and cardiac monitor Approach: midline Location: L3-4 Injection technique: single-shot Needle Needle type: [place] and Introducer  Needle gauge: 24 G Needle length: 9 cm Assessment Sensory level: T10 Additional Notes Negative paresthesia. Negative blood return. Positive free-flowing CSF. Expiration date of kit checked and confirmed. Patient tolerated procedure well, without complications.

## 2018-05-29 NOTE — Evaluation (Signed)
Physical Therapy Evaluation Patient Details Name: Jacqueline Rhodes MRN: [id number] DOB: 1955/09/27 Today's Date: 05/29/2018   History of Present Illness  Pt is a 63 y.o. female s/p R TKA secondary OA 05/29/18.  PMH includes sleep apnea with CPAP, COPD, htn, PVD, anemia, CHF, thyroid CA.  Clinical Impression  Prior to hospital admission, pt was independent with ambulation.  Pt lives with her husband in 1 level home with ramp to enter.  Currently pt is min assist semi-supine to sit; min assist to stand up to pt's specialized walker; and CGA to min assist to ambulate a few feet bed to recliner with pt's RW.  Pain 6/10 R knee beginning/end of session (nurse notified of pt's request for pain meds).  Pt reporting she normally relys with R LE and had difficulty relying on L LE (pt reports h/o L LE impairments).  R knee flexion AROM to 85 degrees.  Pt would benefit from skilled PT to address noted impairments and functional limitations (see below for any additional details).  Upon hospital discharge, recommend pt discharge to home with HHPT.    Follow Up Recommendations Home health PT    Equipment Recommendations  Rolling walker with 5" wheels;3in1 (PT)    Recommendations for Other Services OT consult     Precautions / Restrictions Precautions Precautions: Knee;Fall Precaution Booklet Issued: No Precaution Comments: wound vac Restrictions Weight Bearing Restrictions: Yes RLE Weight Bearing: Weight bearing as tolerated      Mobility  Bed Mobility Overal bed mobility: Needs Assistance Bed Mobility: Supine to Sit     Supine to sit: Min assist;HOB elevated     General bed mobility comments: vc's for technique; assist for R LE; increased effort/time to perform  Transfers Overall transfer level: Needs assistance Equipment used: Rolling walker (2 wheeled) Transfers: Sit to/from Stand Sit to Stand: Min assist         General transfer comment: assist to initiate stand up to pt's  specialized RW (pt requesting to use her specialized walker and pushing up with lower handles and then reaching for upper handles)  Ambulation/Gait Ambulation/Gait assistance: Min guard;Min assist Gait Distance (Feet): 3 Feet(bed to recliner) Assistive device: Rolling walker (2 wheeled) Gait Pattern/deviations: Antalgic Gait velocity: decreased   General Gait Details: decreased stance time R LE; vc's for walker use and stepping pattern  Stairs            Wheelchair Mobility    Modified Rankin (Stroke Patients Only)       Balance Overall balance assessment: Needs assistance Sitting-balance support: No upper extremity supported;Feet supported Sitting balance-Leahy Scale: Good Sitting balance - Comments: steady sitting reaching within BOS   Standing balance support: Single extremity supported Standing balance-Leahy Scale: Poor Standing balance comment: pt requiring at least single UE support for static standing balance                             Pertinent Vitals/Pain Pain Assessment: 0-10 Pain Score: 6  Pain Location: R knee Pain Descriptors / Indicators: Sore;Tender Pain Intervention(s): Limited activity within patient's tolerance;Monitored during session;Premedicated before session;Repositioned;Patient requesting pain meds-RN notified;Other (comment)(polar care applied and activated)    Home Living Family/patient expects to be discharged to:: Private residence Living Arrangements: Spouse/significant other Available Help at Discharge: Family Type of Home: Mobile home Home Access: Ramped entrance     [place]: [place]: Environmental consultant - 2 wheels;Bedside commode;Shower seat;Grab bars - tub/shower  Prior Function Level of Independence: Independent         Comments: No falls in past 7 months (tripped on cord prior to that)     Hand Dominance        Extremity/Trunk Assessment   Upper Extremity Assessment Upper Extremity  Assessment: Overall WFL for tasks assessed    Lower Extremity Assessment Lower Extremity Assessment: RLE deficits/detail;LLE deficits/detail RLE Deficits / Details: good DF/PF AROM at least 3/5; fair R quad set strength; hip flexion at least 3-/5 RLE: Unable to fully assess due to pain    Cervical / Trunk Assessment Cervical / Trunk Assessment: Normal  Communication   Communication: No difficulties  Cognition Arousal/Alertness: Awake/alert Behavior During Therapy: Flat affect Overall Cognitive Status: Within Functional Limits for tasks assessed                                        General Comments General comments (skin integrity, edema, etc.): wound vac in place  Nursing cleared pt for participation in physical therapy.  Pt agreeable to PT session.  Pt's sister and daughter present during session.    Exercises Total Joint Exercises Ankle Circles/Pumps: AROM;Strengthening;Both;10 reps;Supine Quad Sets: AROM;Strengthening;Both;10 reps;Supine Short Arc Quad: AAROM;Strengthening;Right;10 reps;Supine Heel Slides: AAROM;Strengthening;Right;10 reps;Supine Hip ABduction/ADduction: AAROM;Strengthening;Right;10 reps;Supine Goniometric ROM: R knee extension 5 degrees short of neutral semi-supine in bed; R knee flexion AROM to 85 degrees sitting in recliner   Assessment/Plan    PT Assessment Patient needs continued PT services  PT Problem List Decreased strength;Decreased range of motion;Decreased activity tolerance;Decreased balance;Decreased mobility;Decreased knowledge of use of DME;Decreased knowledge of precautions;Pain;Decreased skin integrity       PT Treatment Interventions DME instruction;Gait training;Stair training;Functional mobility training;Therapeutic activities;Therapeutic exercise;Balance training;Patient/family education    PT Goals (Current goals can be found in the Care Plan section)  Acute Rehab PT Goals Patient Stated Goal: to improve strength  and walking PT Goal Formulation: With patient Time For Goal Achievement: 06/12/18 Potential to Achieve Goals: Good    Frequency BID   Barriers to discharge        Co-evaluation               AM-PAC PT "6 Clicks" Mobility  Outcome Measure Help needed turning from your back to your side while in a flat bed without using bedrails?: A Little Help needed moving from lying on your back to sitting on the side of a flat bed without using bedrails?: A Little Help needed moving to and from a bed to a chair (including a wheelchair)?: A Little Help needed standing up from a chair using your arms (e.g., wheelchair or bedside chair)?: A Little Help needed to walk in hospital room?: A Lot Help needed climbing 3-5 steps with a railing? : A Lot 6 Click Score: 16    End of Session Equipment Utilized During Treatment: Gait belt Activity Tolerance: Patient limited by pain Patient left: in chair;with call bell/phone within reach;with chair alarm set;with family/visitor present;with SCD's reapplied;Other (comment)(B heels elevated via towel rolls; polar care in place and activated) Nurse Communication: Mobility status;Patient requests pain meds;Precautions;Weight bearing status PT Visit Diagnosis: Other abnormalities of gait and mobility (R26.89);Muscle weakness (generalized) (M62.81);Difficulty in walking, not elsewhere classified (R26.2);Pain Pain - Right/Left: Right Pain - part of body: Knee    Time: 7619-5093 PT Time Calculation (min) (ACUTE ONLY): 45 min   Charges:   PT Evaluation $PT Eval  Low Complexity: 1 Low PT Treatments $Therapeutic Exercise: 8-22 mins $Therapeutic Activity: 8-22 mins       Leitha Bleak, PT 05/29/18, 4:55 PM [phone]

## 2018-05-29 NOTE — H&P (Signed)
Reviewed paper H+P, will be scanned into chart. No changes noted.  

## 2018-05-29 NOTE — Anesthesia Post-op Follow-up Note (Signed)
Anesthesia QCDR form completed.        

## 2018-05-29 NOTE — Progress Notes (Signed)
15 minute call to floor. 

## 2018-05-30 LAB — CBC
HCT: 32.9 % — ABNORMAL LOW (ref 36.0–46.0)
Hemoglobin: 10.4 g/dL — ABNORMAL LOW (ref 12.0–15.0)
MCH: 30.1 pg (ref 26.0–34.0)
MCHC: 31.6 g/dL (ref 30.0–36.0)
MCV: 95.4 fL (ref 80.0–100.0)
Platelets: 204 10*3/uL (ref 150–400)
RBC: 3.45 MIL/uL — ABNORMAL LOW (ref 3.87–5.11)
RDW: 15.8 % — ABNORMAL HIGH (ref 11.5–15.5)
WBC: 6.6 10*3/uL (ref 4.0–10.5)
nRBC: 0 % (ref 0.0–0.2)

## 2018-05-30 LAB — BASIC METABOLIC PANEL
Anion gap: 7 (ref 5–15)
BUN: 30 mg/dL — ABNORMAL HIGH (ref 8–23)
CO2: 25 mmol/L (ref 22–32)
Calcium: 8.2 mg/dL — ABNORMAL LOW (ref 8.9–10.3)
Chloride: 105 mmol/L (ref 98–111)
Creatinine, Ser: 0.84 mg/dL (ref 0.44–1.00)
GFR calc Af Amer: >60 mL/min (ref >60)
GFR calc non Af Amer: >60 mL/min (ref >60)
Glucose, Bld: 146 mg/dL — ABNORMAL HIGH (ref 70–99)
Potassium: 4.3 mmol/L (ref 3.5–5.1)
Sodium: 137 mmol/L (ref 135–145)

## 2018-05-30 MED ORDER — ENOXAPARIN SODIUM 40 MG/0.4ML ~~LOC~~ SOLN: 40.0000 mg | SUBCUTANEOUS | 0 refills | Status: DC

## 2018-05-30 MED ORDER — METHOCARBAMOL 500 MG PO TABS: 500.0000 mg | ORAL_TABLET | Freq: Four times a day (QID) | ORAL | 0 refills | Status: DC | PRN

## 2018-05-30 MED ORDER — BISACODYL 10 MG RE SUPP
10.0000 mg | Freq: Every day | RECTAL | Status: DC | PRN
  Administered 2018-05-31: 10 mg via RECTAL
  Filled 2018-05-30: qty 1

## 2018-05-30 MED ORDER — OXYCODONE HCL 5 MG PO TABS: 5.0000 mg | ORAL_TABLET | ORAL | 0 refills | Status: DC | PRN

## 2018-05-30 NOTE — Evaluation (Signed)
Occupational Therapy Evaluation Patient Details Name: Jacqueline Rhodes MRN: [id number] DOB: Jan 22, 1956 Today's Date: 05/30/2018    History of Present Illness Pt is a 63 y.o. female s/p R TKA secondary OA 05/29/18.  PMH includes sleep apnea with CPAP, COPD, htn, PVD, anemia, CHF, thyroid CA.   Clinical Impression   Jacqueline Rhodes was seen for OT evaluation this date, POD#1 from above surgery. Pt was independent in all ADLs prior to surgery. Pt is eager to return to PLOF with less pain and improved safety and independence. Pt currently requires Moderate to max (may vary based on pain level) assist for LB dressing and bathing while in seated position due to pain and limited AROM of R knee. Pt instructed in polar care mgt, falls prevention strategies, home/routines modifications, DME/AE for LB bathing and dressing tasks, and compression stocking mgt. Pt would benefit from skilled OT services including additional instruction in dressing techniques with or without assistive devices for dressing and bathing skills to support recall and carryover prior to discharge and ultimately to maximize safety, independence, and minimize falls risk and caregiver burden. Recommend HHOT upon hospital DC to ensure pt safety within daily routines and home environment.       Follow Up Recommendations  Home health OT    Equipment Recommendations  (TBD)    Recommendations for Other Services       Precautions / Restrictions Precautions Precautions: Knee;Fall Precaution Booklet Issued: No Precaution Comments: wound vac Restrictions Weight Bearing Restrictions: Yes RLE Weight Bearing: Weight bearing as tolerated      Mobility Bed Mobility Overal bed mobility: Needs Assistance Bed Mobility: Sit to Supine     Supine to sit: Min assist;HOB elevated Sit to supine: Min assist;HOB elevated   General bed mobility comments: vc's for technique. Min assist for mgt R LE.   Transfers Overall transfer level: Needs  assistance Equipment used: Rolling walker (2 wheeled) Transfers: Sit to/from Stand Sit to Stand: Min guard         General transfer comment: increased effort/time to stand up to RW from room recliner. Pt with heavy ue of UEs on walker.     Balance Overall balance assessment: Needs assistance Sitting-balance support: No upper extremity supported;Feet supported Sitting balance-Leahy Scale: Good Sitting balance - Comments: steady sitting reaching within BOS   Standing balance support: No upper extremity supported Standing balance-Leahy Scale: Good Standing balance comment: steady standing washing hands at sink                           ADL either performed or assessed with clinical judgement   ADL Overall ADL's : Needs assistance/impaired                                       General ADL Comments: Pt requires mod/max assistance for LB ADLs due to pain and decreased ROM of R knee. ADLs in seated position for safety using RW for functional ambulation. Otherwise, pt independent.      Vision Baseline Vision/History: Wears glasses Wears Glasses: At all times Patient Visual Report: No change from baseline       Perception     Praxis      Pertinent Vitals/Pain Pain Assessment: 0-10 Pain Score: 8  Pain Location: R knee Pain Descriptors / Indicators: Sore;Tender;Aching;Grimacing Pain Intervention(s): Limited activity within patient's tolerance;Monitored during session;Other (comment);Repositioned;Premedicated before session;Utilized relaxation  techniques(Polar care in place. )     Hand Dominance     Extremity/Trunk Assessment Upper Extremity Assessment Upper Extremity Assessment: Overall WFL for tasks assessed   Lower Extremity Assessment Lower Extremity Assessment: RLE deficits/detail;Defer to PT evaluation RLE Deficits / Details: s/p R TKA RLE: Unable to fully assess due to immobilization   Cervical / Trunk Assessment Cervical / Trunk  Assessment: Normal   Communication Communication Communication: No difficulties   Cognition Arousal/Alertness: Awake/alert Behavior During Therapy: Flat affect Overall Cognitive Status: Within Functional Limits for tasks assessed                                     General Comments  Wound vac and polar care in place at start and end of session.     Exercises  Other Exercises: Pt and caregiver (sister) educated in falls prevention strategies, routines modification, safe use of AE/DME for ADL tasks, polar care mgt, and compression stocking mgt.    Shoulder Instructions      Home Living Family/patient expects to be discharged to:: Private residence Living Arrangements: Spouse/significant other Available Help at Discharge: Family Type of Home: Mobile home Home Access: Ramped entrance     [place]: One level     Bathroom Shower/Tub: Occupational psychologist: Handicapped height Bathroom Accessibility: Yes How Accessible: Accessible via walker Home Equipment: [place] - 2 wheels;Bedside commode;Shower seat;Grab bars - tub/shower          Prior Functioning/Environment Level of Independence: Independent        Comments: No falls in past 7 months (tripped on cord prior to that)        OT Problem List: Decreased strength;Impaired balance (sitting and/or standing);Decreased knowledge of precautions;Pain;Decreased range of motion;Decreased safety awareness;Obesity;Decreased activity tolerance;Decreased coordination;Decreased knowledge of use of DME or AE      OT Treatment/Interventions: Self-care/ADL training;Therapeutic exercise;Therapeutic activities;DME and/or AE instruction;Patient/family education    OT Goals(Current goals can be found in the care plan section) Acute Rehab OT Goals Patient Stated Goal: to improve strength and walking OT Goal Formulation: With patient Time For Goal Achievement: 06/13/18 Potential to Achieve Goals: Good ADL  Goals Pt Will Perform Lower Body Bathing: sit to/from stand;with adaptive equipment;with supervision(With LRAD/DME for safety.) Pt Will Perform Lower Body Dressing: with supervision;with adaptive equipment;sit to/from stand(With LRAD for safety.) Pt Will Transfer to Toilet: with supervision;bedside commode;ambulating(With LRAD/DME for safety.) Additional ADL Goal #1: Pt will independently verbalize plan to implement at least 3 learned falls prevention strategies within her home/daily routines upon hospital DC.  OT Frequency: Min 1X/week   Barriers to D/C:            Co-evaluation              AM-PAC OT "6 Clicks" Daily Activity     Outcome Measure Help from another person eating meals?: None Help from another person taking care of personal grooming?: None Help from another person toileting, which includes using toliet, bedpan, or urinal?: A Little Help from another person bathing (including washing, rinsing, drying)?: A Lot Help from another person to put on and taking off regular upper body clothing?: A Little Help from another person to put on and taking off regular lower body clothing?: A Lot 6 Click Score: 18   End of Session Equipment Utilized During Treatment: Gait belt;Rolling walker Nurse Communication: Other (comment);Patient requests pain meds(Pt back to bed from  chair.)  Activity Tolerance: Patient limited by pain;Patient tolerated treatment well Patient left: in bed;with call bell/phone within reach;with bed alarm set;with family/visitor present;with SCD's reapplied(With polar care in place and running. )  OT Visit Diagnosis: Other abnormalities of gait and mobility (R26.89);History of falling (Z91.81);Pain Pain - Right/Left: Right Pain - part of body: Knee                Time: 0413-6438 OT Time Calculation (min): 40 min Charges:  OT General Charges $OT Visit: 1 Visit OT Evaluation $OT Eval Low Complexity: 1 Low OT Treatments $Self Care/Home Management :  23-37 mins  Jacqueline Rhodes, M.S., OTR/L Ascom: [phone] 05/30/18, 12:28 PM

## 2018-05-30 NOTE — Progress Notes (Signed)
Physical Therapy Treatment Patient Details Name: Jacqueline Rhodes MRN: [id number] DOB: 10/26/1955 Today's Date: 05/30/2018    History of Present Illness Pt is a 63 y.o. female s/p R TKA secondary OA 05/29/18.  PMH includes sleep apnea with CPAP, COPD, htn, PVD, anemia, CHF, thyroid CA.    PT Comments    Agrees with encouragement.  To edge of bed with rail and assist for RLE.  Once seated, steady without support.  She was able to stand with min a x 1 pushing up on walker handles.  Ambulated 30' then and additional 72' with slow gait.  Frequent standing rest breaks and irregular gait pattern at times.  Verbal cues for walker position.  To bathroom to void and was able to wash hands without LOB.  Sister in and helpful during session.   Follow Up Recommendations  Home health PT     Equipment Recommendations  Rolling walker with 5" wheels;3in1 (PT)    Recommendations for Other Services       Precautions / Restrictions Precautions Precautions: Knee;Fall Precaution Booklet Issued: No Precaution Comments: wound vac Restrictions Weight Bearing Restrictions: Yes RLE Weight Bearing: Weight bearing as tolerated    Mobility  Bed Mobility Overal bed mobility: Needs Assistance Bed Mobility: Sit to Supine     Supine to sit: Min assist;HOB elevated Sit to supine: Min assist;HOB elevated   General bed mobility comments: vc's for technique. Min assist for mgt R LE.   Transfers Overall transfer level: Needs assistance Equipment used: Rolling walker (2 wheeled) Transfers: Sit to/from Stand Sit to Stand: Min guard         General transfer comment: increased effort/time to stand up to RW from room recliner. Pt with heavy ue of UEs on walker.   Ambulation/Gait Ambulation/Gait assistance: Min guard Gait Distance (Feet): 60 Feet Assistive device: Rolling walker (2 wheeled) Gait Pattern/deviations: Step-to pattern Gait velocity: decreased   General Gait Details: 48' then 56' with  walker, slow frequent rest breaks and occasional verbal cues for walker position   Stairs             Wheelchair Mobility    Modified Rankin (Stroke Patients Only)       Balance Overall balance assessment: Needs assistance Sitting-balance support: No upper extremity supported;Feet supported Sitting balance-Leahy Scale: Good Sitting balance - Comments: steady sitting reaching within BOS   Standing balance support: No upper extremity supported Standing balance-Leahy Scale: Good Standing balance comment: steady standing washing hands at sink                            Cognition Arousal/Alertness: Awake/alert Behavior During Therapy: WFL for tasks assessed/performed Overall Cognitive Status: Within Functional Limits for tasks assessed                                        Exercises Other Exercises Other Exercises: Pt and caregiver (sister) educated in falls prevention strategies, routines modification, safe use of AE/DME for ADL tasks, polar care mgt, and compression stocking mgt.     General Comments General comments (skin integrity, edema, etc.): Wound vac and polar care in place at start and end of session.       Pertinent Vitals/Pain Pain Assessment: No/denies pain Pain Score: 8  Pain Location: R knee Pain Descriptors / Indicators: Sore;Tender;Aching;Grimacing Pain Intervention(s): Limited activity within patient's tolerance;Monitored during  session;Repositioned;Ice applied    Home Living Family/patient expects to be discharged to:: Private residence Living Arrangements: Spouse/significant other Available Help at Discharge: Family Type of Home: Mobile home Home Access: Ramped entrance   Home Layout: One level Home Equipment: Environmental consultant - 2 wheels;Bedside commode;Shower seat;Grab bars - tub/shower      Prior Function Level of Independence: Independent      Comments: No falls in past 7 months (tripped on cord prior to that)   PT  Goals (current goals can now be found in the care plan section) Acute Rehab PT Goals Patient Stated Goal: to improve strength and walking Progress towards PT goals: Progressing toward goals    Frequency    BID      PT Plan Current plan remains appropriate    Co-evaluation              AM-PAC PT "6 Clicks" Mobility   Outcome Measure  Help needed turning from your back to your side while in a flat bed without using bedrails?: A Little Help needed moving from lying on your back to sitting on the side of a flat bed without using bedrails?: A Little Help needed moving to and from a bed to a chair (including a wheelchair)?: A Little Help needed standing up from a chair using your arms (e.g., wheelchair or bedside chair)?: A Little Help needed to walk in hospital room?: A Little Help needed climbing 3-5 steps with a railing? : A Lot 6 Click Score: 17    End of Session Equipment Utilized During Treatment: Gait belt Activity Tolerance: Patient limited by pain Patient left: in bed;with call bell/phone within reach;with bed alarm set;with family/visitor present   Pain - Right/Left: Right Pain - part of body: Knee     Time: 1330-1403 PT Time Calculation (min) (ACUTE ONLY): 33 min  Charges:  $Gait Training: 8-22 mins $Therapeutic Activity: 8-22 mins                     Chesley Noon, PTA 05/30/18, 2:33 PM

## 2018-05-30 NOTE — TOC Progression Note (Signed)
Transition of Care [facility]) - Progression Note    Patient Details  Name: Jacqueline Rhodes MRN: [id number] Date of Birth: 17-Oct-1955  Transition of Care [facility]) CM/SW Contact  Su Hilt, RN Phone Number: 05/30/2018, 9:03 AM  Clinical Narrative:    Notified the patient that the copay for Lovenox is $40.00   Expected Discharge Plan: [place] Barriers to Discharge: Continued Medical Work up  Expected Discharge Plan and Services Expected Discharge Plan: [place] Discharge Planning Services: CM Consult Post Acute Care Choice: [place] arrangements for the past 2 months: Mobile Home(has a ramp)                 DME Arranged: (none needed)   HH Arranged: PT [place]: [facility] ([facility])   Social Determinants of Health (SDOH) Interventions    Readmission Risk Interventions 30 Day Unplanned Readmission Risk Score     Admission (Current) from 05/29/2018 in [place] (1A)  30 Day Unplanned Readmission Risk Score (%)  11 Filed at 05/30/2018 0801     This score is the patient's risk of an unplanned readmission within 30 days of being discharged (0 -100%). The score is based on dignosis, age, lab data, medications, orders, and past utilization.   Low:  0-14.9   Medium: 15-21.9   High: 22-29.9   Extreme: 30 and above       No flowsheet data found.

## 2018-05-30 NOTE — Discharge Summary (Signed)
Physician Discharge Summary  Patient ID: Jacqueline Rhodes MRN: [id number] DOB/AGE: 09/13/1955 63 y.o.  Admit date: 05/29/2018 Discharge date: 05/30/2018  Admission Diagnoses:  PRIMARY OSTEOARTHRITIS OF RIGHT KNEE   Discharge Diagnoses: Patient Active Problem List   Diagnosis Date Noted  . S/P TKR (total knee replacement) using cement, right 05/29/2018  . Angina decubitus ([place]) 01/04/2018  . Chronic venous insufficiency 07/27/2017  . Lymphedema 07/27/2017  . Diabetes mellitus without complication ([place]) 56/31/4970  . Hypertension 06/16/2017  . Pain in limb 06/16/2017  . Swelling of limb 06/16/2017    Past Medical History:  Diagnosis Date  . Anemia   . Arthritis   . CHF (congestive heart failure) ([place])    2006  . Collagen vascular disease ([place])   . COPD (chronic obstructive pulmonary disease) ([place])   . Coronary artery disease   . Diabetes mellitus without complication ([place])   . GERD (gastroesophageal reflux disease)   . Hyperlipidemia   . Hypertension   . Knee pain, right   . Lower extremity edema   . Lymphedema   . Peripheral vascular disease ([place])   . RA (rheumatoid arthritis) ([place])   . Sleep apnea   . Thyroid cancer ([place])   . Thyroid cancer [facility])      Transfusion: none   Consultants (if any):   Discharged Condition: Improved  Hospital Course: Jacqueline Rhodes is an 63 y.o. female who was admitted 05/29/2018 with a diagnosis of right knee ostoearhtritis and went to the operating room on 05/29/2018 and underwent the above named procedures.    Surgeries: Procedure(s): TOTAL KNEE ARTHROPLASTY-RIGHT on 05/29/2018 Patient tolerated the surgery well. Taken to PACU where she was stabilized and then transferred to the orthopedic floor.  Started on Lovenox 40 mg q 12 hrs. Foot pumps applied bilaterally at 80 mm. Heels elevated on bed with rolled towels. No evidence of DVT. Negative [place]. Physical therapy started on day #1 for gait training and transfer. OT started day  #1 for ADL and assisted devices.  Patient's foley was d/c on day #1. Patient's IV was d/c on day #2.  On post op day #3 patient was stable and ready for discharge to home with HHPT.  Implants: Medacta GMK sphere system, 4 right femur, 4 right tibia short stem, 10 mm insert and 3 patella, all components cemented  She was given perioperative antibiotics:  Anti-infectives (From admission, onward)   Start     Dose/Rate Route Frequency Ordered Stop   05/29/18 1400  clindamycin (CLEOCIN) IVPB 900 mg     900 mg 100 mL/hr over 30 Minutes Intravenous Every 6 hours 05/29/18 1015 05/30/18 0310   05/29/18 0558  clindamycin (CLEOCIN) 900 MG/50ML IVPB    Note to Pharmacy:  Ronnell Freshwater   : cabinet override      05/29/18 0558 05/29/18 0738   05/28/18 2145  clindamycin (CLEOCIN) IVPB 900 mg     900 mg 100 mL/hr over 30 Minutes Intravenous  Once 05/28/18 2139 05/29/18 0753    .  She was given sequential compression devices, early ambulation, and home with HHPT for DVT prophylaxis.  She benefited maximally from the hospital stay and there were no complications.    Recent vital signs:  Vitals:   05/30/18 1018 05/30/18 1300  BP: (!) 129/54 (!) 131/49  Pulse: 60 75  Resp:  18  Temp:    SpO2:  99%    Recent laboratory studies:  Lab Results  Component Value Date   HGB 10.4 (  L) 05/30/2018   HGB 10.9 (L) 05/29/2018   HGB 12.5 05/16/2018   Lab Results  Component Value Date   WBC 6.6 05/30/2018   PLT 204 05/30/2018   Lab Results  Component Value Date   INR 1.0 05/16/2018   Lab Results  Component Value Date   NA 137 05/30/2018   K 4.3 05/30/2018   CL 105 05/30/2018   CO2 25 05/30/2018   BUN 30 (H) 05/30/2018   CREATININE 0.84 05/30/2018   GLUCOSE 146 (H) 05/30/2018    Discharge Medications:   Allergies as of 05/30/2018      Reactions   Estrogens Other (See Comments)   Blood clot   Codeine Other (See Comments)   Migraine   Flexeril [cyclobenzaprine] Hives   Penicillins  Hives   Has patient had a PCN reaction causing immediate rash, facial/tongue/throat swelling, SOB or lightheadedness with hypotension: yes Has patient had a PCN reaction causing severe rash involving mucus membranes or skin necrosis: no Has patient had a PCN reaction that required hospitalization:  no Has patient had a PCN reaction occurring within the last 10 years: no If all of the above answers are "NO", then may proceed with Cephalosporin use.   Terfenadine Hives   Vicodin [hydrocodone-acetaminophen] Hives      Medication List    TAKE these medications   Accu-Chek Compact Plus test strip Generic drug:  glucose blood Use 4 (four) times daily. Use as instructed.   albuterol 108 (90 Base) MCG/ACT inhaler Commonly known as:  PROVENTIL HFA;VENTOLIN HFA Inhale 2 puffs into the lungs every 6 (six) hours as needed for wheezing or shortness of breath.   aspirin EC 81 MG tablet Take 81 mg by mouth daily.   Breo Ellipta 100-25 MCG/INH Aepb Generic drug:  fluticasone furoate-vilanterol Inhale 1 puff into the lungs daily.   Cetirizine HCl 10 MG Caps Take 10 mg by mouth daily.   CoQ-10 100 MG Caps Take 100 mg by mouth daily.   Cosentyx Sensoready (300 MG) 150 MG/ML Soaj Generic drug:  Secukinumab Inject 300 mg into the skin every 28 (twenty-eight) days.   cyanocobalamin 1000 MCG/ML injection Commonly known as:  (VITAMIN B-12) Inject 1,000 mcg into the muscle every 30 (thirty) days.   Dexilant 60 MG capsule Generic drug:  dexlansoprazole Take 60 mg by mouth daily.   enoxaparin 40 MG/0.4ML injection Commonly known as:  LOVENOX Inject 0.4 mLs (40 mg total) into the skin daily for 14 days.   folic acid 1 MG tablet Commonly known as:  FOLVITE Take 1 mg by mouth every other day.   hydrochlorothiazide 25 MG tablet Commonly known as:  HYDRODIURIL Take 25 mg by mouth daily.   isosorbide mononitrate 60 MG 24 hr tablet Commonly known as:  IMDUR Take 60 mg by mouth daily.    levothyroxine 100 MCG tablet Commonly known as:  SYNTHROID, LEVOTHROID Take 100 mcg by mouth daily before breakfast.   losartan 100 MG tablet Commonly known as:  COZAAR Take 100 mg by mouth daily.   methocarbamol 500 MG tablet Commonly known as:  ROBAXIN Take 1 tablet (500 mg total) by mouth every 6 (six) hours as needed for muscle spasms.   methotrexate 2.5 MG tablet Commonly known as:  RHEUMATREX Take 20 mg by mouth every Thursday.   oxyCODONE 5 MG immediate release tablet Commonly known as:  Oxy IR/ROXICODONE Take 1-2 tablets (5-10 mg total) by mouth every 4 (four) hours as needed for moderate pain (pain score 4-6).  simvastatin 40 MG tablet Commonly known as:  ZOCOR Take 20 mg by mouth at bedtime.   triamcinolone ointment 0.1 % Commonly known as:  KENALOG Apply 1 application topically 2 (two) times daily as needed (psoriasis).            Durable Medical Equipment  (From admission, onward)         Start     Ordered   05/29/18 1016  DME Walker rolling  Once    Question:  Patient needs a walker to treat with the following condition  Answer:  S/P TKR (total knee replacement) using cement, right   05/29/18 1015   05/29/18 1016  DME 3 n 1  Once     05/29/18 1015   05/29/18 1016  DME Bedside commode  Once    Question:  Patient needs a bedside commode to treat with the following condition  Answer:  S/P TKR (total knee replacement) using cement, right   05/29/18 1015          Diagnostic Studies: Dg Knee 1-2 Views Right  Result Date: 05/29/2018 CLINICAL DATA:  63 year old female status post knee surgery. EXAM: RIGHT KNEE - 1-2 VIEW COMPARISON:  None. FINDINGS: AP and cross-table lateral views at 0924 hours. Right total knee arthroplasty. Hardware appears intact and normally aligned. Anterior wound VAC in place with skin staples. Small air-fluid level within the joint space. No unexpected osseous changes. IMPRESSION: Right total knee arthroplasty with no adverse  features. Electronically Signed   By: Genevie Ann M.D.   On: 05/29/2018 09:48    Disposition:     Follow-up Information    Duanne Guess, PA-C Follow up in 2 week(s).   Specialties:  Orthopedic Surgery, Emergency Medicine Contact information: [place] [address] [phone]            Signed: Feliberto Gottron 05/30/2018, 3:59 PM

## 2018-05-30 NOTE — Anesthesia Postprocedure Evaluation (Signed)
Anesthesia Post Note  Patient: Jacqueline Rhodes  Procedure(s) Performed: TOTAL KNEE ARTHROPLASTY-RIGHT (Right )  Patient location during evaluation: Nursing Unit Anesthesia Type: Spinal Level of consciousness: oriented and awake and alert Pain management: pain level controlled Vital Signs Assessment: post-procedure vital signs reviewed and stable Respiratory status: spontaneous breathing and respiratory function stable Cardiovascular status: blood pressure returned to baseline and stable Postop Assessment: no headache, no backache, no apparent nausea or vomiting and patient able to bend at knees Anesthetic complications: no     Last Vitals:  Vitals:   05/29/18 2347 05/30/18 0441  BP: (!) 120/48 (!) 110/53  Pulse: (!) 57 60  Resp: 17 19  Temp: 36.6 C 36.8 C  SpO2: 100% 100%    Last Pain:  Vitals:   05/30/18 0337  TempSrc:   PainSc: Asleep                 Brantley Fling

## 2018-05-30 NOTE — Progress Notes (Signed)
Physical Therapy Treatment Patient Details Name: Jacqueline Rhodes MRN: [id number] DOB: February 04, 1956 Today's Date: 05/30/2018    History of Present Illness Pt is a 63 y.o. female s/p R TKA secondary OA 05/29/18.  PMH includes sleep apnea with CPAP, COPD, htn, PVD, anemia, CHF, thyroid CA.    PT Comments    Pt able to progress to ambulating 30 feet x2 (to bathroom and back) with RW CGA; increased time to ambulate d/t R knee pain but pt appearing very motivated to ambulate and do as much as she could during session.  Pain 6/10 R knee beginning of session and 8-9/10 end of session resting in chair (nurse notified).  R knee flexion ROM to 90 degrees AROM sitting in chair.  Will continue to progress pt with strengthening, knee ROM, and progressive ambulation per pt tolerance.    Follow Up Recommendations  Home health PT     Equipment Recommendations  Rolling walker with 5" wheels;3in1 (PT)    Recommendations for Other Services OT consult     Precautions / Restrictions Precautions Precautions: Knee;Fall Precaution Booklet Issued: No Precaution Comments: wound vac Restrictions Weight Bearing Restrictions: Yes RLE Weight Bearing: Weight bearing as tolerated    Mobility  Bed Mobility Overal bed mobility: Needs Assistance Bed Mobility: Supine to Sit     Supine to sit: Min assist;HOB elevated     General bed mobility comments: vc's for technique; assist for R LE; increased effort/time to perform  Transfers Overall transfer level: Needs assistance Equipment used: Rolling walker (2 wheeled) Transfers: Sit to/from Stand Sit to Stand: Min guard         General transfer comment: increased effort/time to stand up to RW from bed and from [facility] (over toilet); initial cueing for UE and LE placement  Ambulation/Gait Ambulation/Gait assistance: Min guard Gait Distance (Feet): (30 feet x2) Assistive device: Rolling walker (2 wheeled) Gait Pattern/deviations: Antalgic Gait velocity:  decreased   General Gait Details: decreased stance time R LE; vc's for walker use and stepping pattern initially; increased time to perform   Stairs             Wheelchair Mobility    Modified Rankin (Stroke Patients Only)       Balance Overall balance assessment: Needs assistance Sitting-balance support: No upper extremity supported;Feet supported Sitting balance-Leahy Scale: Good Sitting balance - Comments: steady sitting reaching within BOS   Standing balance support: No upper extremity supported Standing balance-Leahy Scale: Good Standing balance comment: steady standing washing hands at sink                            Cognition Arousal/Alertness: Awake/alert Behavior During Therapy: Flat affect Overall Cognitive Status: Within Functional Limits for tasks assessed                                        Exercises Total Joint Exercises Ankle Circles/Pumps: AROM;Strengthening;Both;10 reps;Supine Quad Sets: AROM;Strengthening;Both;10 reps;Supine Short Arc Quad: AAROM;Strengthening;Right;10 reps;Supine Heel Slides: AAROM;Strengthening;Right;10 reps;Supine Hip ABduction/ADduction: AAROM;Strengthening;Right;10 reps;Supine Straight Leg Raises: AAROM;Strengthening;Right;10 reps;Supine Goniometric ROM: R knee extension 5 degrees short of neutral semi-supine in bed; R knee flexion AROM to 90 degrees sitting in recliner    General Comments General comments (skin integrity, edema, etc.): wound vac in place.  Pt agreeable to PT session.  Pt's sister present during session.      Pertinent  Vitals/Pain Pain Assessment: 0-10 Pain Score: 9  Pain Location: R knee Pain Descriptors / Indicators: Sore;Tender;Aching Pain Intervention(s): Limited activity within patient's tolerance;Monitored during session;Premedicated before session;Repositioned;Patient requesting pain meds-RN notified;Other (comment)(polar care applied and activated)    Home Living                       Prior Function            PT Goals (current goals can now be found in the care plan section) Acute Rehab PT Goals Patient Stated Goal: to improve strength and walking PT Goal Formulation: With patient Time For Goal Achievement: 06/12/18 Potential to Achieve Goals: Good Progress towards PT goals: Progressing toward goals    Frequency    BID      PT Plan Current plan remains appropriate    Co-evaluation              AM-PAC PT "6 Clicks" Mobility   Outcome Measure  Help needed turning from your back to your side while in a flat bed without using bedrails?: A Little Help needed moving from lying on your back to sitting on the side of a flat bed without using bedrails?: A Little Help needed moving to and from a bed to a chair (including a wheelchair)?: A Little Help needed standing up from a chair using your arms (e.g., wheelchair or bedside chair)?: A Little Help needed to walk in hospital room?: A Little Help needed climbing 3-5 steps with a railing? : A Lot 6 Click Score: 17    End of Session Equipment Utilized During Treatment: Gait belt Activity Tolerance: Patient limited by pain Patient left: in chair;with call bell/phone within reach;with chair alarm set;with family/visitor present;with SCD's reapplied;Other (comment)(B heels elevated via towel rolls; polar care in place and activated) Nurse Communication: Mobility status;Precautions;Weight bearing status;Other (comment)(pt's pain status) PT Visit Diagnosis: Other abnormalities of gait and mobility (R26.89);Muscle weakness (generalized) (M62.81);Difficulty in walking, not elsewhere classified (R26.2);Pain Pain - Right/Left: Right Pain - part of body: Knee     Time: 9935-7017 PT Time Calculation (min) (ACUTE ONLY): 53 min  Charges:  $Gait Training: 8-22 mins $Therapeutic Exercise: 23-37 mins $Therapeutic Activity: 8-22 mins                    Leitha Bleak, PT 05/30/18,  10:18 AM [phone]

## 2018-05-30 NOTE — Progress Notes (Signed)
   Subjective: 1 Day Post-Op Procedure(s) (LRB): TOTAL KNEE ARTHROPLASTY-RIGHT (Right) Patient reports pain as mild.   Patient is well, and has had no acute complaints or problems Denies any CP, SOB, ABD pain. We will continue therapy today.  Plan is to go Home after hospital stay.  Objective: Vital signs in last 24 hours: Temp:  [97.3 F (36.3 C)-98.3 F (36.8 C)] 97.5 F (36.4 C) (03/18 0741) Pulse Rate:  [51-86] 56 (03/18 0741) Resp:  [13-19] 18 (03/18 0741) BP: (97-132)/(42-57) 119/47 (03/18 0741) SpO2:  [94 %-100 %] 100 % (03/18 0741)  Intake/Output from previous day: 03/17 0701 - 03/18 0700 In: 2451.5 [P.O.:480; I.V.:1821.4; IV Piggyback:150.1] Out: 925 [Urine:825; Drains:100] Intake/Output this shift: No intake/output data recorded.  Recent Labs    05/29/18 1106 05/30/18 0339  HGB 10.9* 10.4*   Recent Labs    05/29/18 1106 05/30/18 0339  WBC 6.7 6.6  RBC 3.60* 3.45*  HCT 34.5* 32.9*  PLT 230 204   Recent Labs    05/29/18 1106 05/30/18 0339  NA  --  137  K  --  4.3  CL  --  105  CO2  --  25  BUN  --  30*  CREATININE 0.79 0.84  GLUCOSE  --  146*  CALCIUM  --  8.2*   No results for input(s): LABPT, INR in the last 72 hours.  EXAM General - Patient is Alert, Appropriate and Oriented Extremity - Neurovascular intact Sensation intact distally Intact pulses distally Dorsiflexion/Plantar flexion intact No cellulitis present Compartment soft Dressing - dressing C/D/I and no drainage, Praveena intact Motor Function - intact, moving foot and toes well on exam.   Past Medical History:  Diagnosis Date  . Anemia   . Arthritis   . CHF (congestive heart failure) ([place])    2006  . Collagen vascular disease ([place])   . COPD (chronic obstructive pulmonary disease) ([place])   . Coronary artery disease   . Diabetes mellitus without complication ([place])   . GERD (gastroesophageal reflux disease)   . Hyperlipidemia   . Hypertension   . Knee pain, right   .  Lower extremity edema   . Lymphedema   . Peripheral vascular disease ([place])   . RA (rheumatoid arthritis) ([place])   . Sleep apnea   . Thyroid cancer ([place])   . Thyroid cancer ([facility])     Assessment/Plan:   1 Day Post-Op Procedure(s) (LRB): TOTAL KNEE ARTHROPLASTY-RIGHT (Right) Active Problems:   S/P TKR (total knee replacement) using cement, right  Estimated body mass index is 45.32 kg/m as calculated from the following:   Height as of this encounter: 5\' 4"  (1.626 m).   Weight as of this encounter: 119.7 kg. Advance diet Up with therapy  Needs bowel movement Labs stable Vital signs stable Pain controlled.  No other complaints. Care management to assist with discharge to home with home health PT  DVT Prophylaxis - Lovenox, Foot Pumps and TED hose Weight-Bearing as tolerated to right leg   T. Rachelle Hora, PA-C [place] 05/30/2018, 8:29 AM

## 2018-05-30 NOTE — Discharge Instructions (Signed)

## 2018-05-31 ENCOUNTER — Ambulatory Visit (INDEPENDENT_AMBULATORY_CARE_PROVIDER_SITE_OTHER): Payer: BLUE CROSS/BLUE SHIELD | Admitting: Nurse Practitioner

## 2018-05-31 NOTE — Progress Notes (Signed)
Occupational Therapy Treatment Patient Details Name: Jacqueline Rhodes MRN: [id number] DOB: [birth year] Today's Date: 05/31/2018    History of present illness Pt is a 63 y.o. female s/p R TKA secondary OA 05/29/18.  PMH includes sleep apnea with CPAP, COPD, htn, PVD, anemia, CHF, thyroid CA.   OT comments  Pt seen for OT tx this date. Sister present for large portion of session. Pt in recliner, reporting 7/10 R knee pain at rest, increasing with any movement. Pt reports difficulty night and morning with poor pain control. Pt declined out of chair ADL. Pt/sister instructed in AE/DME for ADL, polar care mgt, and compression stocking mgt to support recall and carryover. Pt/sister verbalize understanding. Pt continues to benefit from skilled OT services to maximize return to PLOF, minimize falls risk, and minimize caregiver burden. Pt continues to be pain limited.    Follow Up Recommendations  Home health OT    Equipment Recommendations       Recommendations for Other Services      Precautions / Restrictions Precautions Precautions: Knee;Fall Precaution Booklet Issued: No Precaution Comments: wound vac Restrictions Weight Bearing Restrictions: Yes RLE Weight Bearing: Weight bearing as tolerated       Mobility Bed Mobility               General bed mobility comments: Deferred (pt sitting in chair beginning/end of session)  Transfers         General transfer comment: pt declined 2/2 R knee pain    Balance Overall balance assessment: Needs assistance Sitting-balance support: No upper extremity supported;Feet supported Sitting balance-Leahy Scale: Good Sitting balance - Comments: steady sitting reaching within BOS                             ADL either performed or assessed with clinical judgement   ADL Overall ADL's : Needs assistance/impaired                                       General ADL Comments: Pt requires mod/max assistance for  LB ADLs due to pain and decreased ROM of R knee. ADLs in seated position for safety using RW for functional ambulation. Otherwise, pt independent.      Vision Baseline Vision/History: Wears glasses Wears Glasses: At all times Patient Visual Report: No change from baseline     Perception     Praxis      Cognition Arousal/Alertness: Awake/alert Behavior During Therapy: WFL for tasks assessed/performed Overall Cognitive Status: Within Functional Limits for tasks assessed                                          Exercises Other Exercises Other Exercises: Pt/sister instructed in AE/DME for ADL, polar care mgt, and compression stocking mgt to support recall and carryover.    Shoulder Instructions       General Comments wound vac and polar care in place at start and end of session. SCDs reapplied at end of session.    Pertinent Vitals/ Pain       Pain Assessment: 0-10 Pain Score: 7  Pain Location: R knee at rest Pain Descriptors / Indicators: Aching Pain Intervention(s): Limited activity within patient's tolerance;Monitored during session;Repositioned;Ice applied  Home Living  Prior Functioning/Environment              Frequency  Min 1X/week        Progress Toward Goals  OT Goals(current goals can now be found in the care plan section)  Progress towards OT goals: Progressing toward goals  Acute Rehab OT Goals Patient Stated Goal: to improve strength and walking OT Goal Formulation: With patient Time For Goal Achievement: 06/13/18 Potential to Achieve Goals: Good  Plan Discharge plan remains appropriate;Frequency remains appropriate    Co-evaluation                 AM-PAC OT "6 Clicks" Daily Activity     Outcome Measure   Help from another person eating meals?: None Help from another person taking care of personal grooming?: None Help from another person toileting, which  includes using toliet, bedpan, or urinal?: A Little Help from another person bathing (including washing, rinsing, drying)?: A Lot Help from another person to put on and taking off regular upper body clothing?: A Little Help from another person to put on and taking off regular lower body clothing?: A Lot 6 Click Score: 18    End of Session    OT Visit Diagnosis: Other abnormalities of gait and mobility (R26.89);History of falling (Z91.81);Pain Pain - Right/Left: Right Pain - part of body: Knee   Activity Tolerance Patient limited by pain;Patient tolerated treatment well   Patient Left in chair;with call bell/phone within reach;with chair alarm set;with family/visitor present;with SCD's reapplied;Other (comment)(polar care in place)   Nurse Communication          Time: 8341-9622 OT Time Calculation (min): 23 min  Charges: OT General Charges $OT Visit: 1 Visit OT Treatments $Self Care/Home Management : 23-37 mins  Jeni Salles, MPH, MS, OTR/L ascom [phone] 05/31/18, 1:47 PM

## 2018-05-31 NOTE — Progress Notes (Signed)
Physical Therapy Treatment Patient Details Name: Jacqueline Rhodes MRN: [id number] DOB: 15-Jun-1955 Today's Date: 05/31/2018    History of Present Illness Pt is a 63 y.o. female s/p R TKA secondary OA 05/29/18.  PMH includes sleep apnea with CPAP, COPD, htn, PVD, anemia, CHF, thyroid CA.    PT Comments    PT attempted to see pt earlier in morning but pt reporting significant R knee pain at rest so deferred PT session until later in morning after pt able to receive more pain medication.  Pt reported 6-7/10 R knee pain upon PT arrival 2nd attempt.  Able to stand with CGA and then ambulate 45 feet with RW CGA (limited distance ambulating d/t R knee pain increasing to 9/10).  MD Rudene Christians arrived and notified of pt's pain.  R knee flexion ROM limited to 80 degrees AROM d/t R knee pain.  Will continue to progress pt with strengthening, ROM, and progressive ambulation per pt tolerance.    Follow Up Recommendations  Home health PT     Equipment Recommendations  Rolling walker with 5" wheels;3in1 (PT)    Recommendations for Other Services OT consult     Precautions / Restrictions Precautions Precautions: Knee;Fall Precaution Booklet Issued: No Precaution Comments: wound vac Restrictions Weight Bearing Restrictions: Yes RLE Weight Bearing: Weight bearing as tolerated    Mobility  Bed Mobility               General bed mobility comments: Deferred (pt sitting in chair beginning/end of session)  Transfers Overall transfer level: Needs assistance Equipment used: Rolling walker (2 wheeled) Transfers: Sit to/from Stand Sit to Stand: Min guard         General transfer comment: increased effort/time to stand up to RW from recliner  Ambulation/Gait Ambulation/Gait assistance: Min guard Gait Distance (Feet): 45 Feet Assistive device: Rolling walker (2 wheeled) Gait Pattern/deviations: Step-to pattern Gait velocity: decreased   General Gait Details: intermittent standing rest breaks  d/t R knee pain; occasional vc's for walker position   Stairs             Wheelchair Mobility    Modified Rankin (Stroke Patients Only)       Balance Overall balance assessment: Needs assistance Sitting-balance support: No upper extremity supported;Feet supported Sitting balance-Leahy Scale: Good Sitting balance - Comments: steady sitting reaching within BOS   Standing balance support: No upper extremity supported Standing balance-Leahy Scale: Good Standing balance comment: steady standing reaching within BOS                            Cognition Arousal/Alertness: Awake/alert Behavior During Therapy: WFL for tasks assessed/performed Overall Cognitive Status: Within Functional Limits for tasks assessed                                        Exercises Total Joint Exercises Goniometric ROM: R knee extension 5 degrees short of neutral semi-supine in bed; R knee flexion AROM to 80 degrees sitting in recliner    General Comments   Nursing cleared pt for participation in physical therapy.  Pt agreeable and motivated to participate in PT session even though reporting R knee pain.      Pertinent Vitals/Pain Pain Assessment: 0-10 Pain Score: 9  Pain Location: R knee Pain Descriptors / Indicators: Sore;Tender;Aching;Grimacing Pain Intervention(s): Limited activity within patient's tolerance;Monitored during session;Premedicated before session;Repositioned;Patient requesting  pain meds-RN notified;Other (comment)(polar care applied and activated)  HR 100 bpm at rest and increased to 116 bpm with activity (MD Rudene Christians notified)    Home Living                      Prior Function            PT Goals (current goals can now be found in the care plan section) Acute Rehab PT Goals Patient Stated Goal: to improve strength and walking PT Goal Formulation: With patient Time For Goal Achievement: 06/12/18 Potential to Achieve Goals:  Good Progress towards PT goals: Progressing toward goals    Frequency    BID      PT Plan Current plan remains appropriate    Co-evaluation              AM-PAC PT "6 Clicks" Mobility   Outcome Measure  Help needed turning from your back to your side while in a flat bed without using bedrails?: A Little Help needed moving from lying on your back to sitting on the side of a flat bed without using bedrails?: A Little Help needed moving to and from a bed to a chair (including a wheelchair)?: A Little Help needed standing up from a chair using your arms (e.g., wheelchair or bedside chair)?: A Little Help needed to walk in hospital room?: A Little Help needed climbing 3-5 steps with a railing? : A Lot 6 Click Score: 17    End of Session Equipment Utilized During Treatment: Gait belt Activity Tolerance: Patient limited by pain Patient left: in chair;Other (comment)(nurse present to assist pt for toileting (nursing to set pt up when done toileting)) Nurse Communication: Mobility status;Precautions;Weight bearing status;Other (comment)(pt's pain status) PT Visit Diagnosis: Other abnormalities of gait and mobility (R26.89);Muscle weakness (generalized) (M62.81);Difficulty in walking, not elsewhere classified (R26.2);Pain Pain - Right/Left: Right Pain - part of body: Knee     Time: 8657-8469 PT Time Calculation (min) (ACUTE ONLY): 26 min  Charges:  $Gait Training: 8-22 mins $Therapeutic Activity: 8-22 mins                     Leitha Bleak, PT 05/31/18, 1:19 PM [phone]

## 2018-05-31 NOTE — Progress Notes (Signed)
   Subjective: 2 Days Post-Op Procedure(s) (LRB): TOTAL KNEE ARTHROPLASTY-RIGHT (Right) Patient reports pain as moderate.   Patient is well, and has had no acute complaints or problems Denies any CP, SOB, ABD pain. We will continue therapy today.  Plan is to go Home after hospital stay.  Objective: Vital signs in last 24 hours: Temp:  [97.9 F (36.6 C)-99.3 F (37.4 C)] 98.7 F (37.1 C) (03/19 0741) Pulse Rate:  [60-103] 103 (03/19 0741) Resp:  [18-19] 19 (03/18 2327) BP: (129-146)/(49-59) 130/59 (03/19 0741) SpO2:  [93 %-99 %] 95 % (03/19 0741)  Intake/Output from previous day: 03/18 0701 - 03/19 0700 In: 1166.3 [P.O.:480; I.V.:686.3] Out: 25 [Drains:25] Intake/Output this shift: No intake/output data recorded.  Recent Labs    05/29/18 1106 05/30/18 0339  HGB 10.9* 10.4*   Recent Labs    05/29/18 1106 05/30/18 0339  WBC 6.7 6.6  RBC 3.60* 3.45*  HCT 34.5* 32.9*  PLT 230 204   Recent Labs    05/29/18 1106 05/30/18 0339  NA  --  137  K  --  4.3  CL  --  105  CO2  --  25  BUN  --  30*  CREATININE 0.79 0.84  GLUCOSE  --  146*  CALCIUM  --  8.2*   No results for input(s): LABPT, INR in the last 72 hours.  EXAM General - Patient is Alert, Appropriate and Oriented Extremity - Neurovascular intact Sensation intact distally Intact pulses distally Dorsiflexion/Plantar flexion intact No cellulitis present Compartment soft Dressing - dressing C/D/I and no drainage, Praveena intact Motor Function - intact, moving foot and toes well on exam.   Past Medical History:  Diagnosis Date  . Anemia   . Arthritis   . CHF (congestive heart failure) ([place])    2006  . Collagen vascular disease ([place])   . COPD (chronic obstructive pulmonary disease) ([place])   . Coronary artery disease   . Diabetes mellitus without complication ([place])   . GERD (gastroesophageal reflux disease)   . Hyperlipidemia   . Hypertension   . Knee pain, right   . Lower extremity edema   .  Lymphedema   . Peripheral vascular disease ([place])   . RA (rheumatoid arthritis) ([place])   . Sleep apnea   . Thyroid cancer ([place])   . Thyroid cancer ([facility])     Assessment/Plan:   2 Days Post-Op Procedure(s) (LRB): TOTAL KNEE ARTHROPLASTY-RIGHT (Right) Active Problems:   S/P TKR (total knee replacement) using cement, right  Estimated body mass index is 45.32 kg/m as calculated from the following:   Height as of this encounter: 5\' 4"  (1.626 m).   Weight as of this encounter: 119.7 kg. Advance diet Up with therapy  Needs bowel movement Labs stable Vital signs stable Moderate to severe pain in right knee.  Patient encouraged to take pain medication every 4 hours as needed.  We will continue to monitor pain and progress today. Care management to assist with discharge to home with home health PT  DVT Prophylaxis - Lovenox, Foot Pumps and TED hose Weight-Bearing as tolerated to right leg   T. Rachelle Hora, PA-C [place] 05/31/2018, 8:43 AM

## 2018-05-31 NOTE — Progress Notes (Signed)
Physical Therapy Treatment Patient Details Name: Jacqueline Rhodes MRN: [id number] DOB: Jul 25, 1955 Today's Date: 05/31/2018    History of Present Illness Pt is a 63 y.o. female s/p R TKA secondary OA 05/29/18.  PMH includes sleep apnea with CPAP, COPD, htn, PVD, anemia, CHF, thyroid CA.    PT Comments    Pt able to walk 50 feet with RW CGA before needing to stop and sit d/t pt report of significant R knee pain (6-7/10 beginning and end of session at rest); pt pre-medicated with pain meds.  Therapy continues to be limited d/t R knee pain but pt overall steady with transfers and ambulation with RW (CGA provided but not required).  Will continue to progress pt with strengthening, ROM, and progressive ambulation per pt tolerance.    Follow Up Recommendations  Home health PT     Equipment Recommendations  Rolling walker with 5" wheels;3in1 (PT)    Recommendations for Other Services OT consult     Precautions / Restrictions Precautions Precautions: Knee;Fall Precaution Booklet Issued: No Precaution Comments: wound vac Restrictions Weight Bearing Restrictions: Yes RLE Weight Bearing: Weight bearing as tolerated    Mobility  Bed Mobility Overal bed mobility: Needs Assistance Bed Mobility: Sit to Supine       Sit to supine: Min assist;HOB elevated   General bed mobility comments: assist for R LE  Transfers Overall transfer level: Needs assistance Equipment used: Rolling walker (2 wheeled) Transfers: Sit to/from Stand Sit to Stand: Min guard         General transfer comment: increased effort to stand noted but no physical assist required  Ambulation/Gait Ambulation/Gait assistance: Min guard Gait Distance (Feet): 50 Feet Assistive device: Rolling walker (2 wheeled) Gait Pattern/deviations: Step-to pattern Gait velocity: decreased   General Gait Details: intermittent standing rest breaks d/t R knee pain; limited d/t R knee pain   Stairs              Wheelchair Mobility    Modified Rankin (Stroke Patients Only)       Balance Overall balance assessment: Needs assistance Sitting-balance support: No upper extremity supported;Feet supported Sitting balance-Leahy Scale: Good Sitting balance - Comments: steady sitting reaching within BOS   Standing balance support: No upper extremity supported Standing balance-Leahy Scale: Good Standing balance comment: steady standing reaching within BOS                            Cognition Arousal/Alertness: Awake/alert Behavior During Therapy: WFL for tasks assessed/performed Overall Cognitive Status: Within Functional Limits for tasks assessed                                        Exercises Total Joint Exercises Long Arc Quad: AAROM;Strengthening;Right;10 reps;Seated Hip Flexion/Marching: AAROM;Strengthening;Right;10 reps;Seated    General Comments General comments (skin integrity, edema, etc.): wound vac and polar care in place at start and end of session.  Pt's sister present during session.      Pertinent Vitals/Pain Pain Assessment: 0-10 Pain Score: 7  Pain Location: R knee Pain Descriptors / Indicators: Aching;Sore Pain Intervention(s): Limited activity within patient's tolerance;Monitored during session;Premedicated before session;Repositioned;Other (comment)(polar care applied and activated)    Home Living                      Prior Function  PT Goals (current goals can now be found in the care plan section) Acute Rehab PT Goals Patient Stated Goal: to improve strength and walking PT Goal Formulation: With patient Time For Goal Achievement: 06/12/18 Potential to Achieve Goals: Good Progress towards PT goals: Progressing toward goals    Frequency    BID      PT Plan Current plan remains appropriate    Co-evaluation              AM-PAC PT "6 Clicks" Mobility   Outcome Measure  Help needed turning  from your back to your side while in a flat bed without using bedrails?: A Little Help needed moving from lying on your back to sitting on the side of a flat bed without using bedrails?: A Little Help needed moving to and from a bed to a chair (including a wheelchair)?: A Little Help needed standing up from a chair using your arms (e.g., wheelchair or bedside chair)?: A Little Help needed to walk in hospital room?: A Little Help needed climbing 3-5 steps with a railing? : A Lot 6 Click Score: 17    End of Session Equipment Utilized During Treatment: Gait belt Activity Tolerance: Patient limited by pain Patient left: in bed;with call bell/phone within reach;with bed alarm set;with family/visitor present;with SCD's reapplied;Other (comment)(B heels elevated via towel rolls; polar care in place and activated) Nurse Communication: Mobility status;Precautions;Weight bearing status;Other (comment)(pt's pain status) PT Visit Diagnosis: Other abnormalities of gait and mobility (R26.89);Muscle weakness (generalized) (M62.81);Difficulty in walking, not elsewhere classified (R26.2);Pain Pain - Right/Left: Right Pain - part of body: Knee     Time: 1884-1660 PT Time Calculation (min) (ACUTE ONLY): 38 min  Charges:  $Gait Training: 8-22 mins $Therapeutic Exercise: 8-22 mins $Therapeutic Activity: 8-22 mins                    Leitha Bleak, PT 05/31/18, 4:07 PM [phone]

## 2018-06-01 NOTE — Progress Notes (Signed)
Patient is alert and oriented and able to verbalize needs. No complaints of pain at this time. VSS. Printed AVS and scripts given in discharge packet. All instructions gone over with patient at this time. No concerns voiced. All belongings packed. Family at bedside.   Bethann Punches, RN

## 2018-06-01 NOTE — Progress Notes (Signed)
   Subjective: 3 Days Post-Op Procedure(s) (LRB): TOTAL KNEE ARTHROPLASTY-RIGHT (Right) Patient reports pain as mild.   Patient is well, and has had no acute complaints or problems Denies any CP, SOB, ABD pain. We will continue therapy today.  Plan is to go Home after hospital stay.  Objective: Vital signs in last 24 hours: Temp:  [98.4 F (36.9 C)-99.9 F (37.7 C)] 99.9 F (37.7 C) (03/19 2339) Pulse Rate:  [89-102] 100 (03/19 2339) Resp:  [19] 19 (03/19 2339) BP: (101-125)/(55-57) 111/57 (03/19 2339) SpO2:  [91 %-95 %] 93 % (03/19 2339)  Intake/Output from previous day: 03/19 0701 - 03/20 0700 In: 720 [P.O.:720] Out: 2 [Urine:1; Stool:1] Intake/Output this shift: No intake/output data recorded.  Recent Labs    05/29/18 1106 05/30/18 0339  HGB 10.9* 10.4*   Recent Labs    05/29/18 1106 05/30/18 0339  WBC 6.7 6.6  RBC 3.60* 3.45*  HCT 34.5* 32.9*  PLT 230 204   Recent Labs    05/29/18 1106 05/30/18 0339  NA  --  137  K  --  4.3  CL  --  105  CO2  --  25  BUN  --  30*  CREATININE 0.79 0.84  GLUCOSE  --  146*  CALCIUM  --  8.2*   No results for input(s): LABPT, INR in the last 72 hours.  EXAM General - Patient is Alert, Appropriate and Oriented Extremity - Neurovascular intact Sensation intact distally Intact pulses distally Dorsiflexion/Plantar flexion intact No cellulitis present Compartment soft Dressing - dressing C/D/I and no drainage, Praveena intact Motor Function - intact, moving foot and toes well on exam.   Past Medical History:  Diagnosis Date  . Anemia   . Arthritis   . CHF (congestive heart failure) ([place])    2006  . Collagen vascular disease ([place])   . COPD (chronic obstructive pulmonary disease) ([place])   . Coronary artery disease   . Diabetes mellitus without complication ([place])   . GERD (gastroesophageal reflux disease)   . Hyperlipidemia   . Hypertension   . Knee pain, right   . Lower extremity edema   . Lymphedema   .  Peripheral vascular disease ([place])   . RA (rheumatoid arthritis) ([place])   . Sleep apnea   . Thyroid cancer ([place])   . Thyroid cancer ([facility])     Assessment/Plan:   3 Days Post-Op Procedure(s) (LRB): TOTAL KNEE ARTHROPLASTY-RIGHT (Right) Active Problems:   S/P TKR (total knee replacement) using cement, right  Estimated body mass index is 45.32 kg/m as calculated from the following:   Height as of this encounter: 5\' 4"  (1.626 m).   Weight as of this encounter: 119.7 kg. Advance diet Up with therapy  Vital signs stable Right knee pain improved. Patient with good progress with PT. Care management to assist with discharge to home with home health PT today pending completion of PT goals  DVT Prophylaxis - Lovenox, Foot Pumps and TED hose Weight-Bearing as tolerated to right leg   T. Rachelle Hora, PA-C [place] 06/01/2018, 8:04 AM

## 2018-06-01 NOTE — Progress Notes (Signed)
Physical Therapy Treatment Patient Details Name: Jacqueline Rhodes MRN: [id number] DOB: 12/23/1955 Today's Date: 06/01/2018    History of Present Illness Pt is a 63 y.o. female s/p R TKA secondary OA 05/29/18.  PMH includes sleep apnea with CPAP, COPD, htn, PVD, anemia, CHF, thyroid CA.    PT Comments    Pt able to progress to ambulating 60 feet with RW CGA to SBA; pt with multiple standing rest breaks and distance overall limited d/t R knee pain increasing to 9-10/10 with ambulation.  Pain R knee 6-7/10 at rest beginning and end of session.  R knee flexion AAROM to 90 degrees.  Overall pt steady and safe with household transfers and household distance ambulation using RW during session (limited overall d/t R knee pain).  Pt reports plan to sleep in recliner (instead of bed) and has ramp to enter home so pt does not have to do any stairs upon discharge home.  Written HEP issued.  Pt's sister plans to assist pt upon hospital discharge.  Nurse notified of above including pt's body appearing "warm" in general during session.   Follow Up Recommendations  Home health PT;Supervision/Assistance - 24 hour     Equipment Recommendations  Rolling walker with 5" wheels;3in1 (PT)    Recommendations for Other Services OT consult     Precautions / Restrictions Precautions Precautions: Knee;Fall Precaution Booklet Issued: Yes (comment) Precaution Comments: wound vac Restrictions Weight Bearing Restrictions: Yes RLE Weight Bearing: Weight bearing as tolerated    Mobility  Bed Mobility               General bed mobility comments: Deferred (pt up in recliner beginning/end of session)  Transfers Overall transfer level: Needs assistance Equipment used: Rolling walker (2 wheeled) Transfers: Sit to/from Stand Sit to Stand: Supervision         General transfer comment: increased effort to stand noted but no physical assist required; steady  Ambulation/Gait Ambulation/Gait assistance: Min  guard;Supervision Gait Distance (Feet): 60 Feet Assistive device: Rolling walker (2 wheeled) Gait Pattern/deviations: Step-to pattern Gait velocity: decreased   General Gait Details: intermittent standing rest breaks d/t R knee pain; limited distance d/t R knee pain   Stairs             Wheelchair Mobility    Modified Rankin (Stroke Patients Only)       Balance Overall balance assessment: Needs assistance Sitting-balance support: No upper extremity supported;Feet supported Sitting balance-Leahy Scale: Normal Sitting balance - Comments: steady sitting reaching outside BOS   Standing balance support: No upper extremity supported Standing balance-Leahy Scale: Good Standing balance comment: steady standing reaching within BOS                            Cognition Arousal/Alertness: Awake/alert Behavior During Therapy: WFL for tasks assessed/performed Overall Cognitive Status: Within Functional Limits for tasks assessed                                        Exercises Total Joint Exercises Goniometric ROM: R knee extension 5 degrees short of neutral semi-supine in chair; R knee flexion AAROM to 90 degrees sitting in recliner    General Comments General comments (skin integrity, edema, etc.): wound vac and polar care in place at start and end of session.  Nursing cleared pt for participation in physical therapy.  Pt agreeable  to PT session.  Pt's sister present during session.      Pertinent Vitals/Pain Pain Assessment: 0-10 Pain Score: 6  Pain Location: R knee Pain Descriptors / Indicators: Aching;Sore Pain Intervention(s): Limited activity within patient's tolerance;Monitored during session;Premedicated before session;Repositioned;Ice applied(polar care applied and activated)  HR 100 bpm at rest and increased to 116 bpm with activity. O2 sats WFL on room air during session.    Home Living                      Prior Function             PT Goals (current goals can now be found in the care plan section) Acute Rehab PT Goals Patient Stated Goal: to improve strength and walking PT Goal Formulation: With patient Time For Goal Achievement: 06/12/18 Potential to Achieve Goals: Good Progress towards PT goals: Progressing toward goals    Frequency    BID      PT Plan Current plan remains appropriate    Co-evaluation              AM-PAC PT "6 Clicks" Mobility   Outcome Measure  Help needed turning from your back to your side while in a flat bed without using bedrails?: A Little Help needed moving from lying on your back to sitting on the side of a flat bed without using bedrails?: A Little Help needed moving to and from a bed to a chair (including a wheelchair)?: A Little Help needed standing up from a chair using your arms (e.g., wheelchair or bedside chair)?: A Little Help needed to walk in hospital room?: A Little Help needed climbing 3-5 steps with a railing? : A Little 6 Click Score: 18    End of Session Equipment Utilized During Treatment: Gait belt Activity Tolerance: Patient limited by pain Patient left: in chair;with call bell/phone within reach;with chair alarm set;with family/visitor present;with SCD's reapplied;Other (comment)(B heels elevated via towel rolls; polar care in place and activated) Nurse Communication: Mobility status;Precautions;Weight bearing status;Other (comment)(pt's pain status) PT Visit Diagnosis: Other abnormalities of gait and mobility (R26.89);Muscle weakness (generalized) (M62.81);Difficulty in walking, not elsewhere classified (R26.2);Pain Pain - Right/Left: Right Pain - part of body: Knee     Time: 1030-1103 PT Time Calculation (min) (ACUTE ONLY): 33 min  Charges:  $Gait Training: 8-22 mins $Therapeutic Exercise: 8-22 mins                    Leitha Bleak, PT 06/01/18, 11:36 AM [phone]

## 2018-06-03 DIAGNOSIS — Z471 Aftercare following joint replacement surgery: Secondary | ICD-10-CM | POA: Diagnosis not present

## 2018-06-03 DIAGNOSIS — I208 Other forms of angina pectoris: Secondary | ICD-10-CM | POA: Diagnosis not present

## 2018-06-03 DIAGNOSIS — Z96651 Presence of right artificial knee joint: Secondary | ICD-10-CM | POA: Diagnosis not present

## 2018-06-03 DIAGNOSIS — I872 Venous insufficiency (chronic) (peripheral): Secondary | ICD-10-CM | POA: Diagnosis not present

## 2018-06-03 DIAGNOSIS — I890 Lymphedema, not elsewhere classified: Secondary | ICD-10-CM | POA: Diagnosis not present

## 2018-06-03 DIAGNOSIS — Z87891 Personal history of nicotine dependence: Secondary | ICD-10-CM | POA: Diagnosis not present

## 2018-06-03 DIAGNOSIS — E119 Type 2 diabetes mellitus without complications: Secondary | ICD-10-CM | POA: Diagnosis not present

## 2018-06-03 DIAGNOSIS — Z7982 Long term (current) use of aspirin: Secondary | ICD-10-CM | POA: Diagnosis not present

## 2018-06-03 DIAGNOSIS — I10 Essential (primary) hypertension: Secondary | ICD-10-CM | POA: Diagnosis not present

## 2018-06-03 DIAGNOSIS — Z79891 Long term (current) use of opiate analgesic: Secondary | ICD-10-CM | POA: Diagnosis not present

## 2018-06-03 DIAGNOSIS — Z9181 History of falling: Secondary | ICD-10-CM | POA: Diagnosis not present

## 2018-06-05 DIAGNOSIS — Z9181 History of falling: Secondary | ICD-10-CM | POA: Diagnosis not present

## 2018-06-05 DIAGNOSIS — Z7982 Long term (current) use of aspirin: Secondary | ICD-10-CM | POA: Diagnosis not present

## 2018-06-05 DIAGNOSIS — I208 Other forms of angina pectoris: Secondary | ICD-10-CM | POA: Diagnosis not present

## 2018-06-05 DIAGNOSIS — Z471 Aftercare following joint replacement surgery: Secondary | ICD-10-CM | POA: Diagnosis not present

## 2018-06-05 DIAGNOSIS — Z87891 Personal history of nicotine dependence: Secondary | ICD-10-CM | POA: Diagnosis not present

## 2018-06-05 DIAGNOSIS — Z79891 Long term (current) use of opiate analgesic: Secondary | ICD-10-CM | POA: Diagnosis not present

## 2018-06-05 DIAGNOSIS — E119 Type 2 diabetes mellitus without complications: Secondary | ICD-10-CM | POA: Diagnosis not present

## 2018-06-05 DIAGNOSIS — I872 Venous insufficiency (chronic) (peripheral): Secondary | ICD-10-CM | POA: Diagnosis not present

## 2018-06-05 DIAGNOSIS — I10 Essential (primary) hypertension: Secondary | ICD-10-CM | POA: Diagnosis not present

## 2018-06-05 DIAGNOSIS — Z96651 Presence of right artificial knee joint: Secondary | ICD-10-CM | POA: Diagnosis not present

## 2018-06-05 DIAGNOSIS — I890 Lymphedema, not elsewhere classified: Secondary | ICD-10-CM | POA: Diagnosis not present

## 2018-06-06 DIAGNOSIS — Z79891 Long term (current) use of opiate analgesic: Secondary | ICD-10-CM | POA: Diagnosis not present

## 2018-06-06 DIAGNOSIS — I890 Lymphedema, not elsewhere classified: Secondary | ICD-10-CM | POA: Diagnosis not present

## 2018-06-06 DIAGNOSIS — I208 Other forms of angina pectoris: Secondary | ICD-10-CM | POA: Diagnosis not present

## 2018-06-06 DIAGNOSIS — Z96651 Presence of right artificial knee joint: Secondary | ICD-10-CM | POA: Diagnosis not present

## 2018-06-06 DIAGNOSIS — I10 Essential (primary) hypertension: Secondary | ICD-10-CM | POA: Diagnosis not present

## 2018-06-06 DIAGNOSIS — Z87891 Personal history of nicotine dependence: Secondary | ICD-10-CM | POA: Diagnosis not present

## 2018-06-06 DIAGNOSIS — I872 Venous insufficiency (chronic) (peripheral): Secondary | ICD-10-CM | POA: Diagnosis not present

## 2018-06-06 DIAGNOSIS — Z7982 Long term (current) use of aspirin: Secondary | ICD-10-CM | POA: Diagnosis not present

## 2018-06-06 DIAGNOSIS — E119 Type 2 diabetes mellitus without complications: Secondary | ICD-10-CM | POA: Diagnosis not present

## 2018-06-06 DIAGNOSIS — Z9181 History of falling: Secondary | ICD-10-CM | POA: Diagnosis not present

## 2018-06-06 DIAGNOSIS — Z471 Aftercare following joint replacement surgery: Secondary | ICD-10-CM | POA: Diagnosis not present

## 2018-06-08 DIAGNOSIS — I872 Venous insufficiency (chronic) (peripheral): Secondary | ICD-10-CM | POA: Diagnosis not present

## 2018-06-08 DIAGNOSIS — Z7982 Long term (current) use of aspirin: Secondary | ICD-10-CM | POA: Diagnosis not present

## 2018-06-08 DIAGNOSIS — Z9181 History of falling: Secondary | ICD-10-CM | POA: Diagnosis not present

## 2018-06-08 DIAGNOSIS — Z96651 Presence of right artificial knee joint: Secondary | ICD-10-CM | POA: Diagnosis not present

## 2018-06-08 DIAGNOSIS — E119 Type 2 diabetes mellitus without complications: Secondary | ICD-10-CM | POA: Diagnosis not present

## 2018-06-08 DIAGNOSIS — I10 Essential (primary) hypertension: Secondary | ICD-10-CM | POA: Diagnosis not present

## 2018-06-08 DIAGNOSIS — Z87891 Personal history of nicotine dependence: Secondary | ICD-10-CM | POA: Diagnosis not present

## 2018-06-08 DIAGNOSIS — I208 Other forms of angina pectoris: Secondary | ICD-10-CM | POA: Diagnosis not present

## 2018-06-08 DIAGNOSIS — I890 Lymphedema, not elsewhere classified: Secondary | ICD-10-CM | POA: Diagnosis not present

## 2018-06-08 DIAGNOSIS — Z79891 Long term (current) use of opiate analgesic: Secondary | ICD-10-CM | POA: Diagnosis not present

## 2018-06-08 DIAGNOSIS — Z471 Aftercare following joint replacement surgery: Secondary | ICD-10-CM | POA: Diagnosis not present

## 2018-06-11 DIAGNOSIS — I872 Venous insufficiency (chronic) (peripheral): Secondary | ICD-10-CM | POA: Diagnosis not present

## 2018-06-11 DIAGNOSIS — Z96651 Presence of right artificial knee joint: Secondary | ICD-10-CM | POA: Diagnosis not present

## 2018-06-11 DIAGNOSIS — Z87891 Personal history of nicotine dependence: Secondary | ICD-10-CM | POA: Diagnosis not present

## 2018-06-11 DIAGNOSIS — I10 Essential (primary) hypertension: Secondary | ICD-10-CM | POA: Diagnosis not present

## 2018-06-11 DIAGNOSIS — Z79891 Long term (current) use of opiate analgesic: Secondary | ICD-10-CM | POA: Diagnosis not present

## 2018-06-11 DIAGNOSIS — E119 Type 2 diabetes mellitus without complications: Secondary | ICD-10-CM | POA: Diagnosis not present

## 2018-06-11 DIAGNOSIS — Z9181 History of falling: Secondary | ICD-10-CM | POA: Diagnosis not present

## 2018-06-11 DIAGNOSIS — I890 Lymphedema, not elsewhere classified: Secondary | ICD-10-CM | POA: Diagnosis not present

## 2018-06-11 DIAGNOSIS — I208 Other forms of angina pectoris: Secondary | ICD-10-CM | POA: Diagnosis not present

## 2018-06-11 DIAGNOSIS — Z471 Aftercare following joint replacement surgery: Secondary | ICD-10-CM | POA: Diagnosis not present

## 2018-06-11 DIAGNOSIS — Z7982 Long term (current) use of aspirin: Secondary | ICD-10-CM | POA: Diagnosis not present

## 2018-06-13 DIAGNOSIS — M25461 Effusion, right knee: Secondary | ICD-10-CM | POA: Diagnosis not present

## 2018-06-13 DIAGNOSIS — M6281 Muscle weakness (generalized): Secondary | ICD-10-CM | POA: Diagnosis not present

## 2018-06-13 DIAGNOSIS — M25561 Pain in right knee: Secondary | ICD-10-CM | POA: Diagnosis not present

## 2018-06-13 DIAGNOSIS — M25661 Stiffness of right knee, not elsewhere classified: Secondary | ICD-10-CM | POA: Diagnosis not present

## 2018-06-15 DIAGNOSIS — M25561 Pain in right knee: Secondary | ICD-10-CM | POA: Diagnosis not present

## 2018-06-15 DIAGNOSIS — M25461 Effusion, right knee: Secondary | ICD-10-CM | POA: Diagnosis not present

## 2018-06-19 DIAGNOSIS — M25561 Pain in right knee: Secondary | ICD-10-CM | POA: Diagnosis not present

## 2018-06-19 DIAGNOSIS — M6281 Muscle weakness (generalized): Secondary | ICD-10-CM | POA: Diagnosis not present

## 2018-06-19 DIAGNOSIS — M25661 Stiffness of right knee, not elsewhere classified: Secondary | ICD-10-CM | POA: Diagnosis not present

## 2018-06-19 DIAGNOSIS — M25461 Effusion, right knee: Secondary | ICD-10-CM | POA: Diagnosis not present

## 2018-06-26 DIAGNOSIS — M6281 Muscle weakness (generalized): Secondary | ICD-10-CM | POA: Diagnosis not present

## 2018-06-26 DIAGNOSIS — M25461 Effusion, right knee: Secondary | ICD-10-CM | POA: Diagnosis not present

## 2018-06-26 DIAGNOSIS — M25561 Pain in right knee: Secondary | ICD-10-CM | POA: Diagnosis not present

## 2018-06-26 DIAGNOSIS — M25661 Stiffness of right knee, not elsewhere classified: Secondary | ICD-10-CM | POA: Diagnosis not present

## 2018-06-28 DIAGNOSIS — M6281 Muscle weakness (generalized): Secondary | ICD-10-CM | POA: Diagnosis not present

## 2018-06-28 DIAGNOSIS — M25561 Pain in right knee: Secondary | ICD-10-CM | POA: Diagnosis not present

## 2018-06-28 DIAGNOSIS — M25461 Effusion, right knee: Secondary | ICD-10-CM | POA: Diagnosis not present

## 2018-06-28 DIAGNOSIS — M25661 Stiffness of right knee, not elsewhere classified: Secondary | ICD-10-CM | POA: Diagnosis not present

## 2018-07-03 DIAGNOSIS — M25461 Effusion, right knee: Secondary | ICD-10-CM | POA: Diagnosis not present

## 2018-07-03 DIAGNOSIS — M25561 Pain in right knee: Secondary | ICD-10-CM | POA: Diagnosis not present

## 2018-07-03 DIAGNOSIS — M6281 Muscle weakness (generalized): Secondary | ICD-10-CM | POA: Diagnosis not present

## 2018-07-03 DIAGNOSIS — M25661 Stiffness of right knee, not elsewhere classified: Secondary | ICD-10-CM | POA: Diagnosis not present

## 2018-07-05 DIAGNOSIS — M25461 Effusion, right knee: Secondary | ICD-10-CM | POA: Diagnosis not present

## 2018-07-05 DIAGNOSIS — M25661 Stiffness of right knee, not elsewhere classified: Secondary | ICD-10-CM | POA: Diagnosis not present

## 2018-07-05 DIAGNOSIS — M6281 Muscle weakness (generalized): Secondary | ICD-10-CM | POA: Diagnosis not present

## 2018-07-05 DIAGNOSIS — M25561 Pain in right knee: Secondary | ICD-10-CM | POA: Diagnosis not present

## 2018-07-10 DIAGNOSIS — M25561 Pain in right knee: Secondary | ICD-10-CM | POA: Diagnosis not present

## 2018-07-10 DIAGNOSIS — M25661 Stiffness of right knee, not elsewhere classified: Secondary | ICD-10-CM | POA: Diagnosis not present

## 2018-07-10 DIAGNOSIS — M25461 Effusion, right knee: Secondary | ICD-10-CM | POA: Diagnosis not present

## 2018-07-10 DIAGNOSIS — M6281 Muscle weakness (generalized): Secondary | ICD-10-CM | POA: Diagnosis not present

## 2018-07-11 DIAGNOSIS — M25561 Pain in right knee: Secondary | ICD-10-CM | POA: Diagnosis not present

## 2018-07-12 DIAGNOSIS — M25461 Effusion, right knee: Secondary | ICD-10-CM | POA: Diagnosis not present

## 2018-07-12 DIAGNOSIS — M25661 Stiffness of right knee, not elsewhere classified: Secondary | ICD-10-CM | POA: Diagnosis not present

## 2018-07-12 DIAGNOSIS — M25561 Pain in right knee: Secondary | ICD-10-CM | POA: Diagnosis not present

## 2018-07-12 DIAGNOSIS — M6281 Muscle weakness (generalized): Secondary | ICD-10-CM | POA: Diagnosis not present

## 2018-07-16 DIAGNOSIS — M25461 Effusion, right knee: Secondary | ICD-10-CM | POA: Diagnosis not present

## 2018-07-16 DIAGNOSIS — M25561 Pain in right knee: Secondary | ICD-10-CM | POA: Diagnosis not present

## 2018-07-16 DIAGNOSIS — M25661 Stiffness of right knee, not elsewhere classified: Secondary | ICD-10-CM | POA: Diagnosis not present

## 2018-07-16 DIAGNOSIS — M6281 Muscle weakness (generalized): Secondary | ICD-10-CM | POA: Diagnosis not present

## 2018-07-18 DIAGNOSIS — M25561 Pain in right knee: Secondary | ICD-10-CM | POA: Diagnosis not present

## 2018-07-18 DIAGNOSIS — M6281 Muscle weakness (generalized): Secondary | ICD-10-CM | POA: Diagnosis not present

## 2018-07-18 DIAGNOSIS — M25661 Stiffness of right knee, not elsewhere classified: Secondary | ICD-10-CM | POA: Diagnosis not present

## 2018-07-18 DIAGNOSIS — M25461 Effusion, right knee: Secondary | ICD-10-CM | POA: Diagnosis not present

## 2018-07-24 DIAGNOSIS — M25461 Effusion, right knee: Secondary | ICD-10-CM | POA: Diagnosis not present

## 2018-07-24 DIAGNOSIS — M25561 Pain in right knee: Secondary | ICD-10-CM | POA: Diagnosis not present

## 2018-07-24 DIAGNOSIS — M6281 Muscle weakness (generalized): Secondary | ICD-10-CM | POA: Diagnosis not present

## 2018-07-24 DIAGNOSIS — M25661 Stiffness of right knee, not elsewhere classified: Secondary | ICD-10-CM | POA: Diagnosis not present

## 2018-07-26 DIAGNOSIS — M6281 Muscle weakness (generalized): Secondary | ICD-10-CM | POA: Diagnosis not present

## 2018-07-26 DIAGNOSIS — M25561 Pain in right knee: Secondary | ICD-10-CM | POA: Diagnosis not present

## 2018-07-26 DIAGNOSIS — M25461 Effusion, right knee: Secondary | ICD-10-CM | POA: Diagnosis not present

## 2018-07-26 DIAGNOSIS — M25661 Stiffness of right knee, not elsewhere classified: Secondary | ICD-10-CM | POA: Diagnosis not present

## 2018-07-31 DIAGNOSIS — I10 Essential (primary) hypertension: Secondary | ICD-10-CM | POA: Diagnosis not present

## 2018-07-31 DIAGNOSIS — R42 Dizziness and giddiness: Secondary | ICD-10-CM | POA: Diagnosis not present

## 2018-07-31 DIAGNOSIS — Z0000 Encounter for general adult medical examination without abnormal findings: Secondary | ICD-10-CM | POA: Diagnosis not present

## 2018-07-31 DIAGNOSIS — E119 Type 2 diabetes mellitus without complications: Secondary | ICD-10-CM | POA: Diagnosis not present

## 2018-07-31 DIAGNOSIS — L409 Psoriasis, unspecified: Secondary | ICD-10-CM | POA: Diagnosis not present

## 2018-07-31 DIAGNOSIS — L4050 Arthropathic psoriasis, unspecified: Secondary | ICD-10-CM | POA: Diagnosis not present

## 2018-07-31 DIAGNOSIS — Z79899 Other long term (current) drug therapy: Secondary | ICD-10-CM | POA: Diagnosis not present

## 2018-07-31 DIAGNOSIS — I25118 Atherosclerotic heart disease of native coronary artery with other forms of angina pectoris: Secondary | ICD-10-CM | POA: Diagnosis not present

## 2018-07-31 DIAGNOSIS — E782 Mixed hyperlipidemia: Secondary | ICD-10-CM | POA: Diagnosis not present

## 2018-08-02 DIAGNOSIS — M25561 Pain in right knee: Secondary | ICD-10-CM | POA: Diagnosis not present

## 2018-08-02 DIAGNOSIS — M25461 Effusion, right knee: Secondary | ICD-10-CM | POA: Diagnosis not present

## 2018-08-07 DIAGNOSIS — M25561 Pain in right knee: Secondary | ICD-10-CM | POA: Diagnosis not present

## 2018-08-07 DIAGNOSIS — M25461 Effusion, right knee: Secondary | ICD-10-CM | POA: Diagnosis not present

## 2018-08-07 DIAGNOSIS — M25661 Stiffness of right knee, not elsewhere classified: Secondary | ICD-10-CM | POA: Diagnosis not present

## 2018-08-07 DIAGNOSIS — M6281 Muscle weakness (generalized): Secondary | ICD-10-CM | POA: Diagnosis not present

## 2018-08-09 DIAGNOSIS — G4733 Obstructive sleep apnea (adult) (pediatric): Secondary | ICD-10-CM | POA: Diagnosis not present

## 2018-08-09 DIAGNOSIS — I25118 Atherosclerotic heart disease of native coronary artery with other forms of angina pectoris: Secondary | ICD-10-CM | POA: Diagnosis not present

## 2018-08-09 DIAGNOSIS — L4050 Arthropathic psoriasis, unspecified: Secondary | ICD-10-CM | POA: Diagnosis not present

## 2018-08-09 DIAGNOSIS — R601 Generalized edema: Secondary | ICD-10-CM | POA: Diagnosis not present

## 2018-08-09 DIAGNOSIS — I10 Essential (primary) hypertension: Secondary | ICD-10-CM | POA: Diagnosis not present

## 2018-08-09 DIAGNOSIS — L409 Psoriasis, unspecified: Secondary | ICD-10-CM | POA: Diagnosis not present

## 2018-08-09 DIAGNOSIS — M25561 Pain in right knee: Secondary | ICD-10-CM | POA: Diagnosis not present

## 2018-08-09 DIAGNOSIS — E782 Mixed hyperlipidemia: Secondary | ICD-10-CM | POA: Diagnosis not present

## 2018-08-09 DIAGNOSIS — Z0000 Encounter for general adult medical examination without abnormal findings: Secondary | ICD-10-CM | POA: Diagnosis not present

## 2018-08-09 DIAGNOSIS — E119 Type 2 diabetes mellitus without complications: Secondary | ICD-10-CM | POA: Diagnosis not present

## 2018-08-09 DIAGNOSIS — E538 Deficiency of other specified B group vitamins: Secondary | ICD-10-CM | POA: Diagnosis not present

## 2018-08-20 DIAGNOSIS — L853 Xerosis cutis: Secondary | ICD-10-CM | POA: Diagnosis not present

## 2018-08-20 DIAGNOSIS — M79672 Pain in left foot: Secondary | ICD-10-CM | POA: Diagnosis not present

## 2018-08-20 DIAGNOSIS — R234 Changes in skin texture: Secondary | ICD-10-CM | POA: Diagnosis not present

## 2018-08-20 DIAGNOSIS — M79671 Pain in right foot: Secondary | ICD-10-CM | POA: Diagnosis not present

## 2018-09-13 DIAGNOSIS — Z85850 Personal history of malignant neoplasm of thyroid: Secondary | ICD-10-CM | POA: Diagnosis not present

## 2018-09-13 DIAGNOSIS — J301 Allergic rhinitis due to pollen: Secondary | ICD-10-CM | POA: Diagnosis not present

## 2018-09-13 DIAGNOSIS — K219 Gastro-esophageal reflux disease without esophagitis: Secondary | ICD-10-CM | POA: Diagnosis not present

## 2018-09-19 DIAGNOSIS — L853 Xerosis cutis: Secondary | ICD-10-CM | POA: Diagnosis not present

## 2018-09-19 DIAGNOSIS — M79671 Pain in right foot: Secondary | ICD-10-CM | POA: Diagnosis not present

## 2018-09-19 DIAGNOSIS — M79672 Pain in left foot: Secondary | ICD-10-CM | POA: Diagnosis not present

## 2018-09-19 DIAGNOSIS — R234 Changes in skin texture: Secondary | ICD-10-CM | POA: Diagnosis not present

## 2018-10-04 DIAGNOSIS — R0600 Dyspnea, unspecified: Secondary | ICD-10-CM | POA: Diagnosis not present

## 2018-10-04 DIAGNOSIS — G4733 Obstructive sleep apnea (adult) (pediatric): Secondary | ICD-10-CM | POA: Diagnosis not present

## 2018-10-04 DIAGNOSIS — J439 Emphysema, unspecified: Secondary | ICD-10-CM | POA: Diagnosis not present

## 2018-10-25 ENCOUNTER — Other Ambulatory Visit: Payer: Self-pay | Admitting: Physician Assistant

## 2018-10-25 DIAGNOSIS — Z1231 Encounter for screening mammogram for malignant neoplasm of breast: Secondary | ICD-10-CM

## 2018-11-08 DIAGNOSIS — I10 Essential (primary) hypertension: Secondary | ICD-10-CM | POA: Diagnosis not present

## 2018-11-08 DIAGNOSIS — I25111 Atherosclerotic heart disease of native coronary artery with angina pectoris with documented spasm: Secondary | ICD-10-CM | POA: Diagnosis not present

## 2018-11-08 DIAGNOSIS — E538 Deficiency of other specified B group vitamins: Secondary | ICD-10-CM | POA: Diagnosis not present

## 2018-11-08 DIAGNOSIS — E782 Mixed hyperlipidemia: Secondary | ICD-10-CM | POA: Diagnosis not present

## 2018-11-08 DIAGNOSIS — G4733 Obstructive sleep apnea (adult) (pediatric): Secondary | ICD-10-CM | POA: Diagnosis not present

## 2018-11-08 DIAGNOSIS — R600 Localized edema: Secondary | ICD-10-CM | POA: Diagnosis not present

## 2018-11-08 DIAGNOSIS — L409 Psoriasis, unspecified: Secondary | ICD-10-CM | POA: Diagnosis not present

## 2018-11-08 DIAGNOSIS — C73 Malignant neoplasm of thyroid gland: Secondary | ICD-10-CM | POA: Diagnosis not present

## 2018-11-08 DIAGNOSIS — Z79899 Other long term (current) drug therapy: Secondary | ICD-10-CM | POA: Diagnosis not present

## 2018-11-08 DIAGNOSIS — E559 Vitamin D deficiency, unspecified: Secondary | ICD-10-CM | POA: Diagnosis not present

## 2018-11-08 DIAGNOSIS — L4050 Arthropathic psoriasis, unspecified: Secondary | ICD-10-CM | POA: Diagnosis not present

## 2018-11-08 DIAGNOSIS — E119 Type 2 diabetes mellitus without complications: Secondary | ICD-10-CM | POA: Diagnosis not present

## 2018-11-28 ENCOUNTER — Ambulatory Visit
Admission: RE | Admit: 2018-11-28 | Discharge: 2018-11-28 | Disposition: A | Discharge status: Home / Self Care | Payer: PPO | Source: Ambulatory Visit | Attending: Physician Assistant | Admitting: Physician Assistant

## 2018-11-28 DIAGNOSIS — Z1231 Encounter for screening mammogram for malignant neoplasm of breast: Secondary | ICD-10-CM | POA: Diagnosis not present

## 2018-12-13 DIAGNOSIS — L409 Psoriasis, unspecified: Secondary | ICD-10-CM | POA: Diagnosis not present

## 2018-12-13 DIAGNOSIS — R601 Generalized edema: Secondary | ICD-10-CM | POA: Diagnosis not present

## 2018-12-13 DIAGNOSIS — G629 Polyneuropathy, unspecified: Secondary | ICD-10-CM | POA: Diagnosis not present

## 2018-12-13 DIAGNOSIS — L4050 Arthropathic psoriasis, unspecified: Secondary | ICD-10-CM | POA: Diagnosis not present

## 2018-12-13 DIAGNOSIS — M8949 Other hypertrophic osteoarthropathy, multiple sites: Secondary | ICD-10-CM | POA: Diagnosis not present

## 2019-01-02 DIAGNOSIS — E538 Deficiency of other specified B group vitamins: Secondary | ICD-10-CM | POA: Diagnosis not present

## 2019-01-02 DIAGNOSIS — G629 Polyneuropathy, unspecified: Secondary | ICD-10-CM | POA: Diagnosis not present

## 2019-01-02 DIAGNOSIS — E559 Vitamin D deficiency, unspecified: Secondary | ICD-10-CM | POA: Diagnosis not present

## 2019-01-02 DIAGNOSIS — Z79899 Other long term (current) drug therapy: Secondary | ICD-10-CM | POA: Diagnosis not present

## 2019-01-10 DIAGNOSIS — E538 Deficiency of other specified B group vitamins: Secondary | ICD-10-CM | POA: Diagnosis not present

## 2019-01-17 DIAGNOSIS — E538 Deficiency of other specified B group vitamins: Secondary | ICD-10-CM | POA: Diagnosis not present

## 2019-01-25 DIAGNOSIS — E538 Deficiency of other specified B group vitamins: Secondary | ICD-10-CM | POA: Diagnosis not present

## 2019-01-30 DIAGNOSIS — M7989 Other specified soft tissue disorders: Secondary | ICD-10-CM | POA: Diagnosis not present

## 2019-01-30 DIAGNOSIS — R200 Anesthesia of skin: Secondary | ICD-10-CM | POA: Diagnosis not present

## 2019-01-30 DIAGNOSIS — R202 Paresthesia of skin: Secondary | ICD-10-CM | POA: Diagnosis not present

## 2019-01-30 DIAGNOSIS — M79642 Pain in left hand: Secondary | ICD-10-CM | POA: Diagnosis not present

## 2019-02-14 DIAGNOSIS — I25118 Atherosclerotic heart disease of native coronary artery with other forms of angina pectoris: Secondary | ICD-10-CM | POA: Diagnosis not present

## 2019-02-14 DIAGNOSIS — I10 Essential (primary) hypertension: Secondary | ICD-10-CM | POA: Diagnosis not present

## 2019-02-14 DIAGNOSIS — E119 Type 2 diabetes mellitus without complications: Secondary | ICD-10-CM | POA: Diagnosis not present

## 2019-02-14 DIAGNOSIS — E782 Mixed hyperlipidemia: Secondary | ICD-10-CM | POA: Diagnosis not present

## 2019-02-14 DIAGNOSIS — G629 Polyneuropathy, unspecified: Secondary | ICD-10-CM | POA: Diagnosis not present

## 2019-02-14 DIAGNOSIS — I252 Old myocardial infarction: Secondary | ICD-10-CM | POA: Diagnosis not present

## 2019-03-20 DIAGNOSIS — E119 Type 2 diabetes mellitus without complications: Secondary | ICD-10-CM | POA: Diagnosis not present

## 2019-03-20 DIAGNOSIS — I10 Essential (primary) hypertension: Secondary | ICD-10-CM | POA: Diagnosis not present

## 2019-03-20 DIAGNOSIS — C73 Malignant neoplasm of thyroid gland: Secondary | ICD-10-CM | POA: Diagnosis not present

## 2019-03-20 DIAGNOSIS — Z23 Encounter for immunization: Secondary | ICD-10-CM | POA: Diagnosis not present

## 2019-03-20 DIAGNOSIS — Z0000 Encounter for general adult medical examination without abnormal findings: Secondary | ICD-10-CM | POA: Diagnosis not present

## 2019-03-20 DIAGNOSIS — I25111 Atherosclerotic heart disease of native coronary artery with angina pectoris with documented spasm: Secondary | ICD-10-CM | POA: Diagnosis not present

## 2019-03-20 DIAGNOSIS — E538 Deficiency of other specified B group vitamins: Secondary | ICD-10-CM | POA: Diagnosis not present

## 2019-03-20 DIAGNOSIS — L4050 Arthropathic psoriasis, unspecified: Secondary | ICD-10-CM | POA: Diagnosis not present

## 2019-03-20 DIAGNOSIS — G4733 Obstructive sleep apnea (adult) (pediatric): Secondary | ICD-10-CM | POA: Diagnosis not present

## 2019-03-20 DIAGNOSIS — E782 Mixed hyperlipidemia: Secondary | ICD-10-CM | POA: Diagnosis not present

## 2019-03-28 DIAGNOSIS — E119 Type 2 diabetes mellitus without complications: Secondary | ICD-10-CM | POA: Diagnosis not present

## 2019-03-28 DIAGNOSIS — L409 Psoriasis, unspecified: Secondary | ICD-10-CM | POA: Diagnosis not present

## 2019-03-28 DIAGNOSIS — C73 Malignant neoplasm of thyroid gland: Secondary | ICD-10-CM | POA: Diagnosis not present

## 2019-03-28 DIAGNOSIS — E782 Mixed hyperlipidemia: Secondary | ICD-10-CM | POA: Diagnosis not present

## 2019-03-28 DIAGNOSIS — I10 Essential (primary) hypertension: Secondary | ICD-10-CM | POA: Diagnosis not present

## 2019-03-28 DIAGNOSIS — L4050 Arthropathic psoriasis, unspecified: Secondary | ICD-10-CM | POA: Diagnosis not present

## 2019-04-04 DIAGNOSIS — C73 Malignant neoplasm of thyroid gland: Secondary | ICD-10-CM | POA: Diagnosis not present

## 2019-05-01 DIAGNOSIS — E538 Deficiency of other specified B group vitamins: Secondary | ICD-10-CM | POA: Diagnosis not present

## 2019-05-01 DIAGNOSIS — G629 Polyneuropathy, unspecified: Secondary | ICD-10-CM | POA: Diagnosis not present

## 2019-05-01 DIAGNOSIS — E559 Vitamin D deficiency, unspecified: Secondary | ICD-10-CM | POA: Diagnosis not present

## 2019-05-08 DIAGNOSIS — J310 Chronic rhinitis: Secondary | ICD-10-CM | POA: Diagnosis not present

## 2019-05-08 DIAGNOSIS — R0600 Dyspnea, unspecified: Secondary | ICD-10-CM | POA: Diagnosis not present

## 2019-05-08 DIAGNOSIS — G4733 Obstructive sleep apnea (adult) (pediatric): Secondary | ICD-10-CM | POA: Diagnosis not present

## 2019-06-12 DIAGNOSIS — R197 Diarrhea, unspecified: Secondary | ICD-10-CM | POA: Diagnosis not present

## 2019-06-12 DIAGNOSIS — K921 Melena: Secondary | ICD-10-CM | POA: Diagnosis not present

## 2019-06-12 DIAGNOSIS — R109 Unspecified abdominal pain: Secondary | ICD-10-CM | POA: Diagnosis not present

## 2019-06-13 DIAGNOSIS — K921 Melena: Secondary | ICD-10-CM | POA: Diagnosis not present

## 2019-06-13 DIAGNOSIS — R197 Diarrhea, unspecified: Secondary | ICD-10-CM | POA: Diagnosis not present

## 2019-06-27 DIAGNOSIS — L4050 Arthropathic psoriasis, unspecified: Secondary | ICD-10-CM | POA: Diagnosis not present

## 2019-06-27 DIAGNOSIS — L409 Psoriasis, unspecified: Secondary | ICD-10-CM | POA: Diagnosis not present

## 2019-07-04 DIAGNOSIS — Z79899 Other long term (current) drug therapy: Secondary | ICD-10-CM | POA: Diagnosis not present

## 2019-07-04 DIAGNOSIS — L409 Psoriasis, unspecified: Secondary | ICD-10-CM | POA: Diagnosis not present

## 2019-07-04 DIAGNOSIS — L4050 Arthropathic psoriasis, unspecified: Secondary | ICD-10-CM | POA: Diagnosis not present

## 2019-07-04 DIAGNOSIS — G629 Polyneuropathy, unspecified: Secondary | ICD-10-CM | POA: Diagnosis not present

## 2019-07-05 DIAGNOSIS — H26491 Other secondary cataract, right eye: Secondary | ICD-10-CM | POA: Diagnosis not present

## 2019-08-15 DIAGNOSIS — E119 Type 2 diabetes mellitus without complications: Secondary | ICD-10-CM | POA: Diagnosis not present

## 2019-08-15 DIAGNOSIS — G4733 Obstructive sleep apnea (adult) (pediatric): Secondary | ICD-10-CM | POA: Diagnosis not present

## 2019-08-15 DIAGNOSIS — I25118 Atherosclerotic heart disease of native coronary artery with other forms of angina pectoris: Secondary | ICD-10-CM | POA: Diagnosis not present

## 2019-08-15 DIAGNOSIS — I10 Essential (primary) hypertension: Secondary | ICD-10-CM | POA: Diagnosis not present

## 2019-08-15 DIAGNOSIS — E782 Mixed hyperlipidemia: Secondary | ICD-10-CM | POA: Diagnosis not present

## 2019-08-15 DIAGNOSIS — I252 Old myocardial infarction: Secondary | ICD-10-CM | POA: Diagnosis not present

## 2019-08-22 DIAGNOSIS — Z0000 Encounter for general adult medical examination without abnormal findings: Secondary | ICD-10-CM | POA: Diagnosis not present

## 2019-08-22 DIAGNOSIS — L4050 Arthropathic psoriasis, unspecified: Secondary | ICD-10-CM | POA: Diagnosis not present

## 2019-08-22 DIAGNOSIS — E039 Hypothyroidism, unspecified: Secondary | ICD-10-CM | POA: Diagnosis not present

## 2019-08-22 DIAGNOSIS — Z780 Asymptomatic menopausal state: Secondary | ICD-10-CM | POA: Diagnosis not present

## 2019-08-22 DIAGNOSIS — E119 Type 2 diabetes mellitus without complications: Secondary | ICD-10-CM | POA: Diagnosis not present

## 2019-08-22 DIAGNOSIS — M0579 Rheumatoid arthritis with rheumatoid factor of multiple sites without organ or systems involvement: Secondary | ICD-10-CM | POA: Diagnosis not present

## 2019-08-22 DIAGNOSIS — C73 Malignant neoplasm of thyroid gland: Secondary | ICD-10-CM | POA: Diagnosis not present

## 2019-08-22 DIAGNOSIS — E782 Mixed hyperlipidemia: Secondary | ICD-10-CM | POA: Diagnosis not present

## 2019-08-22 DIAGNOSIS — G4733 Obstructive sleep apnea (adult) (pediatric): Secondary | ICD-10-CM | POA: Diagnosis not present

## 2019-08-22 DIAGNOSIS — I25111 Atherosclerotic heart disease of native coronary artery with angina pectoris with documented spasm: Secondary | ICD-10-CM | POA: Diagnosis not present

## 2019-08-22 DIAGNOSIS — I10 Essential (primary) hypertension: Secondary | ICD-10-CM | POA: Diagnosis not present

## 2019-08-28 DIAGNOSIS — M8588 Other specified disorders of bone density and structure, other site: Secondary | ICD-10-CM | POA: Diagnosis not present

## 2019-09-13 DIAGNOSIS — Z1211 Encounter for screening for malignant neoplasm of colon: Secondary | ICD-10-CM | POA: Diagnosis not present

## 2019-09-18 LAB — COLOGUARD
COLOGUARD: NEGATIVE
COLOGUARD: NEGATIVE

## 2019-09-26 DIAGNOSIS — L409 Psoriasis, unspecified: Secondary | ICD-10-CM | POA: Diagnosis not present

## 2019-09-26 DIAGNOSIS — E039 Hypothyroidism, unspecified: Secondary | ICD-10-CM | POA: Diagnosis not present

## 2019-09-26 DIAGNOSIS — Z79899 Other long term (current) drug therapy: Secondary | ICD-10-CM | POA: Diagnosis not present

## 2019-09-26 DIAGNOSIS — E119 Type 2 diabetes mellitus without complications: Secondary | ICD-10-CM | POA: Diagnosis not present

## 2019-09-26 DIAGNOSIS — C73 Malignant neoplasm of thyroid gland: Secondary | ICD-10-CM | POA: Diagnosis not present

## 2019-09-26 DIAGNOSIS — L4050 Arthropathic psoriasis, unspecified: Secondary | ICD-10-CM | POA: Diagnosis not present

## 2019-09-26 DIAGNOSIS — E782 Mixed hyperlipidemia: Secondary | ICD-10-CM | POA: Diagnosis not present

## 2019-10-03 DIAGNOSIS — M8949 Other hypertrophic osteoarthropathy, multiple sites: Secondary | ICD-10-CM | POA: Diagnosis not present

## 2019-10-03 DIAGNOSIS — M65331 Trigger finger, right middle finger: Secondary | ICD-10-CM | POA: Diagnosis not present

## 2019-10-03 DIAGNOSIS — L4050 Arthropathic psoriasis, unspecified: Secondary | ICD-10-CM | POA: Diagnosis not present

## 2019-10-03 DIAGNOSIS — M25561 Pain in right knee: Secondary | ICD-10-CM | POA: Diagnosis not present

## 2019-10-03 DIAGNOSIS — L409 Psoriasis, unspecified: Secondary | ICD-10-CM | POA: Diagnosis not present

## 2019-10-03 DIAGNOSIS — Z79899 Other long term (current) drug therapy: Secondary | ICD-10-CM | POA: Diagnosis not present

## 2019-10-03 DIAGNOSIS — G8929 Other chronic pain: Secondary | ICD-10-CM | POA: Diagnosis not present

## 2019-11-07 DIAGNOSIS — B349 Viral infection, unspecified: Secondary | ICD-10-CM | POA: Diagnosis not present

## 2019-11-07 DIAGNOSIS — J209 Acute bronchitis, unspecified: Secondary | ICD-10-CM | POA: Diagnosis not present

## 2019-11-07 DIAGNOSIS — R05 Cough: Secondary | ICD-10-CM | POA: Diagnosis not present

## 2019-11-13 DIAGNOSIS — U071 COVID-19: Secondary | ICD-10-CM | POA: Diagnosis not present

## 2019-11-13 DIAGNOSIS — J208 Acute bronchitis due to other specified organisms: Secondary | ICD-10-CM | POA: Diagnosis not present

## 2019-11-15 DIAGNOSIS — U071 COVID-19: Secondary | ICD-10-CM | POA: Diagnosis not present

## 2019-11-15 DIAGNOSIS — J208 Acute bronchitis due to other specified organisms: Secondary | ICD-10-CM | POA: Diagnosis not present

## 2019-11-29 DIAGNOSIS — J310 Chronic rhinitis: Secondary | ICD-10-CM | POA: Diagnosis not present

## 2019-11-29 DIAGNOSIS — Z8616 Personal history of COVID-19: Secondary | ICD-10-CM | POA: Diagnosis not present

## 2019-11-29 DIAGNOSIS — G4733 Obstructive sleep apnea (adult) (pediatric): Secondary | ICD-10-CM | POA: Diagnosis not present

## 2019-11-29 DIAGNOSIS — R0600 Dyspnea, unspecified: Secondary | ICD-10-CM | POA: Diagnosis not present

## 2019-11-29 DIAGNOSIS — R05 Cough: Secondary | ICD-10-CM | POA: Diagnosis not present

## 2019-12-25 ENCOUNTER — Other Ambulatory Visit: Payer: Self-pay | Admitting: Physician Assistant

## 2019-12-25 DIAGNOSIS — I25111 Atherosclerotic heart disease of native coronary artery with angina pectoris with documented spasm: Secondary | ICD-10-CM | POA: Diagnosis not present

## 2019-12-25 DIAGNOSIS — E782 Mixed hyperlipidemia: Secondary | ICD-10-CM | POA: Diagnosis not present

## 2019-12-25 DIAGNOSIS — L4050 Arthropathic psoriasis, unspecified: Secondary | ICD-10-CM | POA: Diagnosis not present

## 2019-12-25 DIAGNOSIS — E119 Type 2 diabetes mellitus without complications: Secondary | ICD-10-CM | POA: Diagnosis not present

## 2019-12-25 DIAGNOSIS — Z1231 Encounter for screening mammogram for malignant neoplasm of breast: Secondary | ICD-10-CM | POA: Diagnosis not present

## 2019-12-25 DIAGNOSIS — G629 Polyneuropathy, unspecified: Secondary | ICD-10-CM | POA: Diagnosis not present

## 2019-12-25 DIAGNOSIS — C73 Malignant neoplasm of thyroid gland: Secondary | ICD-10-CM | POA: Diagnosis not present

## 2019-12-25 DIAGNOSIS — E039 Hypothyroidism, unspecified: Secondary | ICD-10-CM | POA: Diagnosis not present

## 2019-12-25 DIAGNOSIS — E538 Deficiency of other specified B group vitamins: Secondary | ICD-10-CM | POA: Diagnosis not present

## 2019-12-25 DIAGNOSIS — Z79899 Other long term (current) drug therapy: Secondary | ICD-10-CM | POA: Diagnosis not present

## 2019-12-25 DIAGNOSIS — L409 Psoriasis, unspecified: Secondary | ICD-10-CM | POA: Diagnosis not present

## 2019-12-25 DIAGNOSIS — G4733 Obstructive sleep apnea (adult) (pediatric): Secondary | ICD-10-CM | POA: Diagnosis not present

## 2019-12-25 DIAGNOSIS — I700 Atherosclerosis of aorta: Secondary | ICD-10-CM | POA: Diagnosis not present

## 2019-12-25 DIAGNOSIS — I10 Essential (primary) hypertension: Secondary | ICD-10-CM | POA: Diagnosis not present

## 2020-01-24 ENCOUNTER — Other Ambulatory Visit: Payer: Self-pay

## 2020-01-24 ENCOUNTER — Ambulatory Visit
Admission: RE | Admit: 2020-01-24 | Discharge: 2020-01-24 | Disposition: A | Discharge status: Home / Self Care | Payer: PPO | Source: Ambulatory Visit | Attending: Physician Assistant | Admitting: Physician Assistant

## 2020-01-24 DIAGNOSIS — Z1231 Encounter for screening mammogram for malignant neoplasm of breast: Secondary | ICD-10-CM | POA: Insufficient documentation

## 2020-02-19 DIAGNOSIS — I252 Old myocardial infarction: Secondary | ICD-10-CM | POA: Diagnosis not present

## 2020-02-19 DIAGNOSIS — R0600 Dyspnea, unspecified: Secondary | ICD-10-CM | POA: Diagnosis not present

## 2020-02-19 DIAGNOSIS — R0789 Other chest pain: Secondary | ICD-10-CM | POA: Diagnosis not present

## 2020-02-19 DIAGNOSIS — R079 Chest pain, unspecified: Secondary | ICD-10-CM | POA: Diagnosis not present

## 2020-02-19 DIAGNOSIS — I25118 Atherosclerotic heart disease of native coronary artery with other forms of angina pectoris: Secondary | ICD-10-CM | POA: Diagnosis not present

## 2020-04-01 DIAGNOSIS — Z79899 Other long term (current) drug therapy: Secondary | ICD-10-CM | POA: Diagnosis not present

## 2020-04-01 DIAGNOSIS — L409 Psoriasis, unspecified: Secondary | ICD-10-CM | POA: Diagnosis not present

## 2020-04-01 DIAGNOSIS — L4050 Arthropathic psoriasis, unspecified: Secondary | ICD-10-CM | POA: Diagnosis not present

## 2020-04-01 DIAGNOSIS — C73 Malignant neoplasm of thyroid gland: Secondary | ICD-10-CM | POA: Diagnosis not present

## 2020-04-02 DIAGNOSIS — R300 Dysuria: Secondary | ICD-10-CM | POA: Diagnosis not present

## 2020-04-08 DIAGNOSIS — C73 Malignant neoplasm of thyroid gland: Secondary | ICD-10-CM | POA: Diagnosis not present

## 2020-04-09 DIAGNOSIS — L409 Psoriasis, unspecified: Secondary | ICD-10-CM | POA: Diagnosis not present

## 2020-04-09 DIAGNOSIS — M8949 Other hypertrophic osteoarthropathy, multiple sites: Secondary | ICD-10-CM | POA: Diagnosis not present

## 2020-04-09 DIAGNOSIS — Z79899 Other long term (current) drug therapy: Secondary | ICD-10-CM | POA: Diagnosis not present

## 2020-04-09 DIAGNOSIS — R300 Dysuria: Secondary | ICD-10-CM | POA: Diagnosis not present

## 2020-04-09 DIAGNOSIS — L4050 Arthropathic psoriasis, unspecified: Secondary | ICD-10-CM | POA: Diagnosis not present

## 2020-04-29 DIAGNOSIS — Z6841 Body Mass Index (BMI) 40.0 and over, adult: Secondary | ICD-10-CM | POA: Diagnosis not present

## 2020-04-29 DIAGNOSIS — E039 Hypothyroidism, unspecified: Secondary | ICD-10-CM | POA: Diagnosis not present

## 2020-04-29 DIAGNOSIS — G4733 Obstructive sleep apnea (adult) (pediatric): Secondary | ICD-10-CM | POA: Diagnosis not present

## 2020-04-29 DIAGNOSIS — C73 Malignant neoplasm of thyroid gland: Secondary | ICD-10-CM | POA: Diagnosis not present

## 2020-04-29 DIAGNOSIS — M0579 Rheumatoid arthritis with rheumatoid factor of multiple sites without organ or systems involvement: Secondary | ICD-10-CM | POA: Diagnosis not present

## 2020-04-29 DIAGNOSIS — E782 Mixed hyperlipidemia: Secondary | ICD-10-CM | POA: Diagnosis not present

## 2020-04-29 DIAGNOSIS — E119 Type 2 diabetes mellitus without complications: Secondary | ICD-10-CM | POA: Diagnosis not present

## 2020-04-29 DIAGNOSIS — Z0000 Encounter for general adult medical examination without abnormal findings: Secondary | ICD-10-CM | POA: Diagnosis not present

## 2020-04-29 DIAGNOSIS — I25111 Atherosclerotic heart disease of native coronary artery with angina pectoris with documented spasm: Secondary | ICD-10-CM | POA: Diagnosis not present

## 2020-04-29 DIAGNOSIS — E538 Deficiency of other specified B group vitamins: Secondary | ICD-10-CM | POA: Diagnosis not present

## 2020-04-29 DIAGNOSIS — L4050 Arthropathic psoriasis, unspecified: Secondary | ICD-10-CM | POA: Diagnosis not present

## 2020-04-29 DIAGNOSIS — I10 Essential (primary) hypertension: Secondary | ICD-10-CM | POA: Diagnosis not present

## 2020-05-06 DIAGNOSIS — I25118 Atherosclerotic heart disease of native coronary artery with other forms of angina pectoris: Secondary | ICD-10-CM | POA: Diagnosis not present

## 2020-05-06 DIAGNOSIS — R079 Chest pain, unspecified: Secondary | ICD-10-CM | POA: Diagnosis not present

## 2020-05-06 DIAGNOSIS — E0789 Other specified disorders of thyroid: Secondary | ICD-10-CM | POA: Diagnosis not present

## 2020-05-06 DIAGNOSIS — I252 Old myocardial infarction: Secondary | ICD-10-CM | POA: Diagnosis not present

## 2020-05-08 DIAGNOSIS — Z01818 Encounter for other preprocedural examination: Secondary | ICD-10-CM | POA: Diagnosis not present

## 2020-05-08 DIAGNOSIS — R942 Abnormal results of pulmonary function studies: Secondary | ICD-10-CM | POA: Diagnosis not present

## 2020-05-08 DIAGNOSIS — R0600 Dyspnea, unspecified: Secondary | ICD-10-CM | POA: Diagnosis not present

## 2020-05-08 DIAGNOSIS — G4733 Obstructive sleep apnea (adult) (pediatric): Secondary | ICD-10-CM | POA: Diagnosis not present

## 2020-05-08 DIAGNOSIS — J310 Chronic rhinitis: Secondary | ICD-10-CM | POA: Diagnosis not present

## 2020-05-13 DIAGNOSIS — E782 Mixed hyperlipidemia: Secondary | ICD-10-CM | POA: Diagnosis not present

## 2020-05-13 DIAGNOSIS — I10 Essential (primary) hypertension: Secondary | ICD-10-CM | POA: Diagnosis not present

## 2020-05-13 DIAGNOSIS — I25118 Atherosclerotic heart disease of native coronary artery with other forms of angina pectoris: Secondary | ICD-10-CM | POA: Diagnosis not present

## 2020-05-21 ENCOUNTER — Other Ambulatory Visit: Payer: Self-pay | Admitting: Physician Assistant

## 2020-05-21 ENCOUNTER — Other Ambulatory Visit: Payer: Self-pay

## 2020-05-21 ENCOUNTER — Other Ambulatory Visit (HOSPITAL_COMMUNITY): Payer: Self-pay | Admitting: Physician Assistant

## 2020-05-21 ENCOUNTER — Ambulatory Visit
Admission: RE | Admit: 2020-05-21 | Discharge: 2020-05-21 | Disposition: A | Discharge status: Home / Self Care | Payer: PPO | Source: Ambulatory Visit | Attending: Physician Assistant | Admitting: Physician Assistant

## 2020-05-21 DIAGNOSIS — R531 Weakness: Secondary | ICD-10-CM

## 2020-05-21 DIAGNOSIS — R42 Dizziness and giddiness: Secondary | ICD-10-CM | POA: Diagnosis not present

## 2020-05-21 DIAGNOSIS — R519 Headache, unspecified: Secondary | ICD-10-CM

## 2020-05-21 DIAGNOSIS — R202 Paresthesia of skin: Secondary | ICD-10-CM | POA: Diagnosis not present

## 2020-05-21 DIAGNOSIS — H539 Unspecified visual disturbance: Secondary | ICD-10-CM | POA: Insufficient documentation

## 2020-06-18 DIAGNOSIS — G629 Polyneuropathy, unspecified: Secondary | ICD-10-CM | POA: Diagnosis not present

## 2020-06-18 DIAGNOSIS — R208 Other disturbances of skin sensation: Secondary | ICD-10-CM | POA: Diagnosis not present

## 2020-06-18 DIAGNOSIS — R200 Anesthesia of skin: Secondary | ICD-10-CM | POA: Diagnosis not present

## 2020-06-18 DIAGNOSIS — R202 Paresthesia of skin: Secondary | ICD-10-CM | POA: Diagnosis not present

## 2020-06-18 DIAGNOSIS — R29898 Other symptoms and signs involving the musculoskeletal system: Secondary | ICD-10-CM | POA: Diagnosis not present

## 2020-07-08 DIAGNOSIS — L4050 Arthropathic psoriasis, unspecified: Secondary | ICD-10-CM | POA: Diagnosis not present

## 2020-07-08 DIAGNOSIS — L409 Psoriasis, unspecified: Secondary | ICD-10-CM | POA: Diagnosis not present

## 2020-07-08 DIAGNOSIS — Z79899 Other long term (current) drug therapy: Secondary | ICD-10-CM | POA: Diagnosis not present

## 2020-07-24 DIAGNOSIS — J40 Bronchitis, not specified as acute or chronic: Secondary | ICD-10-CM | POA: Diagnosis not present

## 2020-07-24 DIAGNOSIS — R6889 Other general symptoms and signs: Secondary | ICD-10-CM | POA: Diagnosis not present

## 2020-09-02 DIAGNOSIS — I10 Essential (primary) hypertension: Secondary | ICD-10-CM | POA: Diagnosis not present

## 2020-09-02 DIAGNOSIS — E119 Type 2 diabetes mellitus without complications: Secondary | ICD-10-CM | POA: Diagnosis not present

## 2020-09-02 DIAGNOSIS — E782 Mixed hyperlipidemia: Secondary | ICD-10-CM | POA: Diagnosis not present

## 2020-09-09 ENCOUNTER — Other Ambulatory Visit: Payer: Self-pay | Admitting: Physician Assistant

## 2020-09-09 DIAGNOSIS — E782 Mixed hyperlipidemia: Secondary | ICD-10-CM | POA: Diagnosis not present

## 2020-09-09 DIAGNOSIS — I25111 Atherosclerotic heart disease of native coronary artery with angina pectoris with documented spasm: Secondary | ICD-10-CM | POA: Diagnosis not present

## 2020-09-09 DIAGNOSIS — E538 Deficiency of other specified B group vitamins: Secondary | ICD-10-CM | POA: Diagnosis not present

## 2020-09-09 DIAGNOSIS — L4050 Arthropathic psoriasis, unspecified: Secondary | ICD-10-CM | POA: Diagnosis not present

## 2020-09-09 DIAGNOSIS — C73 Malignant neoplasm of thyroid gland: Secondary | ICD-10-CM | POA: Diagnosis not present

## 2020-09-09 DIAGNOSIS — I10 Essential (primary) hypertension: Secondary | ICD-10-CM | POA: Diagnosis not present

## 2020-09-09 DIAGNOSIS — Z1231 Encounter for screening mammogram for malignant neoplasm of breast: Secondary | ICD-10-CM

## 2020-09-09 DIAGNOSIS — Z0000 Encounter for general adult medical examination without abnormal findings: Secondary | ICD-10-CM | POA: Diagnosis not present

## 2020-09-09 DIAGNOSIS — M0579 Rheumatoid arthritis with rheumatoid factor of multiple sites without organ or systems involvement: Secondary | ICD-10-CM | POA: Diagnosis not present

## 2020-09-09 DIAGNOSIS — E119 Type 2 diabetes mellitus without complications: Secondary | ICD-10-CM | POA: Diagnosis not present

## 2020-09-09 DIAGNOSIS — E039 Hypothyroidism, unspecified: Secondary | ICD-10-CM | POA: Diagnosis not present

## 2020-09-09 DIAGNOSIS — G4733 Obstructive sleep apnea (adult) (pediatric): Secondary | ICD-10-CM | POA: Diagnosis not present

## 2020-10-07 DIAGNOSIS — L4050 Arthropathic psoriasis, unspecified: Secondary | ICD-10-CM | POA: Diagnosis not present

## 2020-10-07 DIAGNOSIS — L409 Psoriasis, unspecified: Secondary | ICD-10-CM | POA: Diagnosis not present

## 2020-10-07 DIAGNOSIS — Z79899 Other long term (current) drug therapy: Secondary | ICD-10-CM | POA: Diagnosis not present

## 2020-10-15 DIAGNOSIS — L409 Psoriasis, unspecified: Secondary | ICD-10-CM | POA: Diagnosis not present

## 2020-10-15 DIAGNOSIS — G629 Polyneuropathy, unspecified: Secondary | ICD-10-CM | POA: Diagnosis not present

## 2020-10-15 DIAGNOSIS — L4050 Arthropathic psoriasis, unspecified: Secondary | ICD-10-CM | POA: Diagnosis not present

## 2020-10-15 DIAGNOSIS — M7061 Trochanteric bursitis, right hip: Secondary | ICD-10-CM | POA: Diagnosis not present

## 2020-10-15 DIAGNOSIS — M159 Polyosteoarthritis, unspecified: Secondary | ICD-10-CM | POA: Diagnosis not present

## 2020-10-15 DIAGNOSIS — Z79899 Other long term (current) drug therapy: Secondary | ICD-10-CM | POA: Diagnosis not present

## 2020-10-21 DIAGNOSIS — R200 Anesthesia of skin: Secondary | ICD-10-CM | POA: Diagnosis not present

## 2020-10-21 DIAGNOSIS — R208 Other disturbances of skin sensation: Secondary | ICD-10-CM | POA: Diagnosis not present

## 2020-10-21 DIAGNOSIS — R519 Headache, unspecified: Secondary | ICD-10-CM | POA: Diagnosis not present

## 2020-10-21 DIAGNOSIS — R202 Paresthesia of skin: Secondary | ICD-10-CM | POA: Diagnosis not present

## 2020-10-21 DIAGNOSIS — G479 Sleep disorder, unspecified: Secondary | ICD-10-CM | POA: Diagnosis not present

## 2020-11-25 DIAGNOSIS — R0609 Other forms of dyspnea: Secondary | ICD-10-CM | POA: Diagnosis not present

## 2020-11-25 DIAGNOSIS — G4733 Obstructive sleep apnea (adult) (pediatric): Secondary | ICD-10-CM | POA: Diagnosis not present

## 2020-11-25 DIAGNOSIS — J449 Chronic obstructive pulmonary disease, unspecified: Secondary | ICD-10-CM | POA: Diagnosis not present

## 2020-11-25 DIAGNOSIS — R911 Solitary pulmonary nodule: Secondary | ICD-10-CM | POA: Diagnosis not present

## 2020-12-23 DIAGNOSIS — K529 Noninfective gastroenteritis and colitis, unspecified: Secondary | ICD-10-CM | POA: Diagnosis not present

## 2020-12-23 DIAGNOSIS — E119 Type 2 diabetes mellitus without complications: Secondary | ICD-10-CM | POA: Diagnosis not present

## 2020-12-23 DIAGNOSIS — I10 Essential (primary) hypertension: Secondary | ICD-10-CM | POA: Diagnosis not present

## 2020-12-23 DIAGNOSIS — I25111 Atherosclerotic heart disease of native coronary artery with angina pectoris with documented spasm: Secondary | ICD-10-CM | POA: Diagnosis not present

## 2020-12-24 DIAGNOSIS — R202 Paresthesia of skin: Secondary | ICD-10-CM | POA: Diagnosis not present

## 2020-12-24 DIAGNOSIS — R200 Anesthesia of skin: Secondary | ICD-10-CM | POA: Diagnosis not present

## 2020-12-24 DIAGNOSIS — G479 Sleep disorder, unspecified: Secondary | ICD-10-CM | POA: Diagnosis not present

## 2020-12-24 DIAGNOSIS — R208 Other disturbances of skin sensation: Secondary | ICD-10-CM | POA: Diagnosis not present

## 2020-12-24 DIAGNOSIS — G629 Polyneuropathy, unspecified: Secondary | ICD-10-CM | POA: Diagnosis not present

## 2021-01-01 DIAGNOSIS — M4312 Spondylolisthesis, cervical region: Secondary | ICD-10-CM | POA: Diagnosis not present

## 2021-01-01 DIAGNOSIS — M4802 Spinal stenosis, cervical region: Secondary | ICD-10-CM | POA: Diagnosis not present

## 2021-01-01 DIAGNOSIS — M79602 Pain in left arm: Secondary | ICD-10-CM | POA: Diagnosis not present

## 2021-01-01 DIAGNOSIS — M47813 Spondylosis without myelopathy or radiculopathy, cervicothoracic region: Secondary | ICD-10-CM | POA: Diagnosis not present

## 2021-01-01 DIAGNOSIS — M25512 Pain in left shoulder: Secondary | ICD-10-CM | POA: Diagnosis not present

## 2021-01-01 DIAGNOSIS — M5412 Radiculopathy, cervical region: Secondary | ICD-10-CM | POA: Diagnosis not present

## 2021-01-07 DIAGNOSIS — E119 Type 2 diabetes mellitus without complications: Secondary | ICD-10-CM | POA: Diagnosis not present

## 2021-01-07 DIAGNOSIS — Z79899 Other long term (current) drug therapy: Secondary | ICD-10-CM | POA: Diagnosis not present

## 2021-01-07 DIAGNOSIS — I10 Essential (primary) hypertension: Secondary | ICD-10-CM | POA: Diagnosis not present

## 2021-01-07 DIAGNOSIS — E782 Mixed hyperlipidemia: Secondary | ICD-10-CM | POA: Diagnosis not present

## 2021-01-07 DIAGNOSIS — L4050 Arthropathic psoriasis, unspecified: Secondary | ICD-10-CM | POA: Diagnosis not present

## 2021-01-13 DIAGNOSIS — R200 Anesthesia of skin: Secondary | ICD-10-CM | POA: Diagnosis not present

## 2021-01-13 DIAGNOSIS — E782 Mixed hyperlipidemia: Secondary | ICD-10-CM | POA: Diagnosis not present

## 2021-01-13 DIAGNOSIS — Z1231 Encounter for screening mammogram for malignant neoplasm of breast: Secondary | ICD-10-CM | POA: Diagnosis not present

## 2021-01-13 DIAGNOSIS — I10 Essential (primary) hypertension: Secondary | ICD-10-CM | POA: Diagnosis not present

## 2021-01-13 DIAGNOSIS — L4050 Arthropathic psoriasis, unspecified: Secondary | ICD-10-CM | POA: Diagnosis not present

## 2021-01-13 DIAGNOSIS — Z23 Encounter for immunization: Secondary | ICD-10-CM | POA: Diagnosis not present

## 2021-01-13 DIAGNOSIS — G4733 Obstructive sleep apnea (adult) (pediatric): Secondary | ICD-10-CM | POA: Diagnosis not present

## 2021-01-13 DIAGNOSIS — I25111 Atherosclerotic heart disease of native coronary artery with angina pectoris with documented spasm: Secondary | ICD-10-CM | POA: Diagnosis not present

## 2021-01-13 DIAGNOSIS — E119 Type 2 diabetes mellitus without complications: Secondary | ICD-10-CM | POA: Diagnosis not present

## 2021-01-13 DIAGNOSIS — M0579 Rheumatoid arthritis with rheumatoid factor of multiple sites without organ or systems involvement: Secondary | ICD-10-CM | POA: Diagnosis not present

## 2021-01-13 DIAGNOSIS — R300 Dysuria: Secondary | ICD-10-CM | POA: Diagnosis not present

## 2021-01-13 DIAGNOSIS — E039 Hypothyroidism, unspecified: Secondary | ICD-10-CM | POA: Diagnosis not present

## 2021-01-27 ENCOUNTER — Ambulatory Visit
Admission: RE | Admit: 2021-01-27 | Discharge: 2021-01-27 | Disposition: A | Discharge status: Home / Self Care | Payer: PPO | Source: Ambulatory Visit | Attending: Physician Assistant | Admitting: Physician Assistant

## 2021-01-27 ENCOUNTER — Other Ambulatory Visit: Payer: Self-pay

## 2021-01-27 DIAGNOSIS — Z1231 Encounter for screening mammogram for malignant neoplasm of breast: Secondary | ICD-10-CM | POA: Diagnosis not present

## 2021-03-04 DIAGNOSIS — Z6841 Body Mass Index (BMI) 40.0 and over, adult: Secondary | ICD-10-CM | POA: Diagnosis not present

## 2021-03-04 DIAGNOSIS — E119 Type 2 diabetes mellitus without complications: Secondary | ICD-10-CM | POA: Diagnosis not present

## 2021-03-04 DIAGNOSIS — I10 Essential (primary) hypertension: Secondary | ICD-10-CM | POA: Diagnosis not present

## 2021-03-04 DIAGNOSIS — I2510 Atherosclerotic heart disease of native coronary artery without angina pectoris: Secondary | ICD-10-CM | POA: Diagnosis not present

## 2021-03-04 DIAGNOSIS — E782 Mixed hyperlipidemia: Secondary | ICD-10-CM | POA: Diagnosis not present

## 2021-03-10 DIAGNOSIS — M79602 Pain in left arm: Secondary | ICD-10-CM | POA: Diagnosis not present

## 2021-03-10 DIAGNOSIS — M7542 Impingement syndrome of left shoulder: Secondary | ICD-10-CM | POA: Diagnosis not present

## 2021-03-10 DIAGNOSIS — M5412 Radiculopathy, cervical region: Secondary | ICD-10-CM | POA: Diagnosis not present

## 2021-04-01 DIAGNOSIS — C73 Malignant neoplasm of thyroid gland: Secondary | ICD-10-CM | POA: Diagnosis not present

## 2021-04-07 DIAGNOSIS — M3501 Sicca syndrome with keratoconjunctivitis: Secondary | ICD-10-CM | POA: Diagnosis not present

## 2021-04-08 DIAGNOSIS — C73 Malignant neoplasm of thyroid gland: Secondary | ICD-10-CM | POA: Diagnosis not present

## 2021-04-16 DIAGNOSIS — Z79899 Other long term (current) drug therapy: Secondary | ICD-10-CM | POA: Diagnosis not present

## 2021-04-16 DIAGNOSIS — L4050 Arthropathic psoriasis, unspecified: Secondary | ICD-10-CM | POA: Diagnosis not present

## 2021-04-22 DIAGNOSIS — L4050 Arthropathic psoriasis, unspecified: Secondary | ICD-10-CM | POA: Diagnosis not present

## 2021-04-22 DIAGNOSIS — M159 Polyosteoarthritis, unspecified: Secondary | ICD-10-CM | POA: Diagnosis not present

## 2021-04-22 DIAGNOSIS — L409 Psoriasis, unspecified: Secondary | ICD-10-CM | POA: Diagnosis not present

## 2021-04-22 DIAGNOSIS — G629 Polyneuropathy, unspecified: Secondary | ICD-10-CM | POA: Diagnosis not present

## 2021-04-30 DIAGNOSIS — Z03818 Encounter for observation for suspected exposure to other biological agents ruled out: Secondary | ICD-10-CM | POA: Diagnosis not present

## 2021-04-30 DIAGNOSIS — Z6841 Body Mass Index (BMI) 40.0 and over, adult: Secondary | ICD-10-CM | POA: Diagnosis not present

## 2021-04-30 DIAGNOSIS — G4733 Obstructive sleep apnea (adult) (pediatric): Secondary | ICD-10-CM | POA: Diagnosis not present

## 2021-04-30 DIAGNOSIS — R0609 Other forms of dyspnea: Secondary | ICD-10-CM | POA: Diagnosis not present

## 2021-04-30 DIAGNOSIS — Z01818 Encounter for other preprocedural examination: Secondary | ICD-10-CM | POA: Diagnosis not present

## 2021-04-30 DIAGNOSIS — J439 Emphysema, unspecified: Secondary | ICD-10-CM | POA: Diagnosis not present

## 2021-05-06 DIAGNOSIS — E119 Type 2 diabetes mellitus without complications: Secondary | ICD-10-CM | POA: Diagnosis not present

## 2021-05-06 DIAGNOSIS — E538 Deficiency of other specified B group vitamins: Secondary | ICD-10-CM | POA: Diagnosis not present

## 2021-05-06 DIAGNOSIS — E039 Hypothyroidism, unspecified: Secondary | ICD-10-CM | POA: Diagnosis not present

## 2021-05-06 DIAGNOSIS — I10 Essential (primary) hypertension: Secondary | ICD-10-CM | POA: Diagnosis not present

## 2021-05-06 DIAGNOSIS — E782 Mixed hyperlipidemia: Secondary | ICD-10-CM | POA: Diagnosis not present

## 2021-05-13 DIAGNOSIS — I10 Essential (primary) hypertension: Secondary | ICD-10-CM | POA: Diagnosis not present

## 2021-05-13 DIAGNOSIS — M5412 Radiculopathy, cervical region: Secondary | ICD-10-CM | POA: Diagnosis not present

## 2021-05-13 DIAGNOSIS — K219 Gastro-esophageal reflux disease without esophagitis: Secondary | ICD-10-CM | POA: Diagnosis not present

## 2021-05-13 DIAGNOSIS — R197 Diarrhea, unspecified: Secondary | ICD-10-CM | POA: Diagnosis not present

## 2021-05-13 DIAGNOSIS — I25111 Atherosclerotic heart disease of native coronary artery with angina pectoris with documented spasm: Secondary | ICD-10-CM | POA: Diagnosis not present

## 2021-05-13 DIAGNOSIS — L4050 Arthropathic psoriasis, unspecified: Secondary | ICD-10-CM | POA: Diagnosis not present

## 2021-05-13 DIAGNOSIS — E538 Deficiency of other specified B group vitamins: Secondary | ICD-10-CM | POA: Diagnosis not present

## 2021-05-13 DIAGNOSIS — E782 Mixed hyperlipidemia: Secondary | ICD-10-CM | POA: Diagnosis not present

## 2021-05-13 DIAGNOSIS — E039 Hypothyroidism, unspecified: Secondary | ICD-10-CM | POA: Diagnosis not present

## 2021-05-13 DIAGNOSIS — E119 Type 2 diabetes mellitus without complications: Secondary | ICD-10-CM | POA: Diagnosis not present

## 2021-05-13 DIAGNOSIS — Z0000 Encounter for general adult medical examination without abnormal findings: Secondary | ICD-10-CM | POA: Diagnosis not present

## 2021-05-13 DIAGNOSIS — G4733 Obstructive sleep apnea (adult) (pediatric): Secondary | ICD-10-CM | POA: Diagnosis not present

## 2021-07-23 DIAGNOSIS — R200 Anesthesia of skin: Secondary | ICD-10-CM | POA: Diagnosis not present

## 2021-07-23 DIAGNOSIS — G479 Sleep disorder, unspecified: Secondary | ICD-10-CM | POA: Diagnosis not present

## 2021-07-23 DIAGNOSIS — R208 Other disturbances of skin sensation: Secondary | ICD-10-CM | POA: Diagnosis not present

## 2021-07-23 DIAGNOSIS — G629 Polyneuropathy, unspecified: Secondary | ICD-10-CM | POA: Diagnosis not present

## 2021-07-23 DIAGNOSIS — R519 Headache, unspecified: Secondary | ICD-10-CM | POA: Diagnosis not present

## 2021-07-23 DIAGNOSIS — R202 Paresthesia of skin: Secondary | ICD-10-CM | POA: Diagnosis not present

## 2021-08-12 DIAGNOSIS — L4050 Arthropathic psoriasis, unspecified: Secondary | ICD-10-CM | POA: Diagnosis not present

## 2021-08-12 DIAGNOSIS — Z79899 Other long term (current) drug therapy: Secondary | ICD-10-CM | POA: Diagnosis not present

## 2021-09-29 DIAGNOSIS — K219 Gastro-esophageal reflux disease without esophagitis: Secondary | ICD-10-CM | POA: Diagnosis not present

## 2021-09-29 DIAGNOSIS — M0579 Rheumatoid arthritis with rheumatoid factor of multiple sites without organ or systems involvement: Secondary | ICD-10-CM | POA: Diagnosis not present

## 2021-09-29 DIAGNOSIS — L4050 Arthropathic psoriasis, unspecified: Secondary | ICD-10-CM | POA: Diagnosis not present

## 2021-09-29 DIAGNOSIS — G4733 Obstructive sleep apnea (adult) (pediatric): Secondary | ICD-10-CM | POA: Diagnosis not present

## 2021-09-29 DIAGNOSIS — J439 Emphysema, unspecified: Secondary | ICD-10-CM | POA: Diagnosis not present

## 2021-09-29 DIAGNOSIS — Z6841 Body Mass Index (BMI) 40.0 and over, adult: Secondary | ICD-10-CM | POA: Diagnosis not present

## 2021-09-29 DIAGNOSIS — Z9989 Dependence on other enabling machines and devices: Secondary | ICD-10-CM | POA: Diagnosis not present

## 2021-09-29 DIAGNOSIS — E538 Deficiency of other specified B group vitamins: Secondary | ICD-10-CM | POA: Diagnosis not present

## 2021-09-29 DIAGNOSIS — E039 Hypothyroidism, unspecified: Secondary | ICD-10-CM | POA: Diagnosis not present

## 2021-09-29 DIAGNOSIS — C73 Malignant neoplasm of thyroid gland: Secondary | ICD-10-CM | POA: Diagnosis not present

## 2021-09-29 DIAGNOSIS — E119 Type 2 diabetes mellitus without complications: Secondary | ICD-10-CM | POA: Diagnosis not present

## 2021-09-29 DIAGNOSIS — I10 Essential (primary) hypertension: Secondary | ICD-10-CM | POA: Diagnosis not present

## 2021-09-29 DIAGNOSIS — E782 Mixed hyperlipidemia: Secondary | ICD-10-CM | POA: Diagnosis not present

## 2021-09-29 DIAGNOSIS — I25111 Atherosclerotic heart disease of native coronary artery with angina pectoris with documented spasm: Secondary | ICD-10-CM | POA: Diagnosis not present

## 2021-11-17 DIAGNOSIS — M19071 Primary osteoarthritis, right ankle and foot: Secondary | ICD-10-CM | POA: Diagnosis not present

## 2021-11-17 DIAGNOSIS — M19041 Primary osteoarthritis, right hand: Secondary | ICD-10-CM | POA: Diagnosis not present

## 2021-11-17 DIAGNOSIS — M19072 Primary osteoarthritis, left ankle and foot: Secondary | ICD-10-CM | POA: Diagnosis not present

## 2021-11-17 DIAGNOSIS — M79675 Pain in left toe(s): Secondary | ICD-10-CM | POA: Diagnosis not present

## 2021-11-17 DIAGNOSIS — L4050 Arthropathic psoriasis, unspecified: Secondary | ICD-10-CM | POA: Diagnosis not present

## 2021-11-17 DIAGNOSIS — Z79899 Other long term (current) drug therapy: Secondary | ICD-10-CM | POA: Diagnosis not present

## 2021-11-17 DIAGNOSIS — M19042 Primary osteoarthritis, left hand: Secondary | ICD-10-CM | POA: Diagnosis not present

## 2021-12-08 DIAGNOSIS — I2510 Atherosclerotic heart disease of native coronary artery without angina pectoris: Secondary | ICD-10-CM | POA: Diagnosis not present

## 2021-12-08 DIAGNOSIS — E782 Mixed hyperlipidemia: Secondary | ICD-10-CM | POA: Diagnosis not present

## 2021-12-08 DIAGNOSIS — G4733 Obstructive sleep apnea (adult) (pediatric): Secondary | ICD-10-CM | POA: Diagnosis not present

## 2021-12-08 DIAGNOSIS — E119 Type 2 diabetes mellitus without complications: Secondary | ICD-10-CM | POA: Diagnosis not present

## 2021-12-08 DIAGNOSIS — I10 Essential (primary) hypertension: Secondary | ICD-10-CM | POA: Diagnosis not present

## 2021-12-29 DIAGNOSIS — L4050 Arthropathic psoriasis, unspecified: Secondary | ICD-10-CM | POA: Diagnosis not present

## 2022-01-26 DIAGNOSIS — R208 Other disturbances of skin sensation: Secondary | ICD-10-CM | POA: Diagnosis not present

## 2022-01-26 DIAGNOSIS — R200 Anesthesia of skin: Secondary | ICD-10-CM | POA: Diagnosis not present

## 2022-01-26 DIAGNOSIS — R519 Headache, unspecified: Secondary | ICD-10-CM | POA: Diagnosis not present

## 2022-01-26 DIAGNOSIS — G479 Sleep disorder, unspecified: Secondary | ICD-10-CM | POA: Diagnosis not present

## 2022-01-26 DIAGNOSIS — R202 Paresthesia of skin: Secondary | ICD-10-CM | POA: Diagnosis not present

## 2022-01-28 DIAGNOSIS — G4733 Obstructive sleep apnea (adult) (pediatric): Secondary | ICD-10-CM | POA: Diagnosis not present

## 2022-01-28 DIAGNOSIS — J439 Emphysema, unspecified: Secondary | ICD-10-CM | POA: Diagnosis not present

## 2022-01-28 DIAGNOSIS — Z2821 Immunization not carried out because of patient refusal: Secondary | ICD-10-CM | POA: Diagnosis not present

## 2022-01-28 DIAGNOSIS — Z6841 Body Mass Index (BMI) 40.0 and over, adult: Secondary | ICD-10-CM | POA: Diagnosis not present

## 2022-02-09 ENCOUNTER — Other Ambulatory Visit: Payer: Self-pay | Admitting: Physician Assistant

## 2022-02-09 DIAGNOSIS — C73 Malignant neoplasm of thyroid gland: Secondary | ICD-10-CM | POA: Diagnosis not present

## 2022-02-09 DIAGNOSIS — E538 Deficiency of other specified B group vitamins: Secondary | ICD-10-CM | POA: Diagnosis not present

## 2022-02-09 DIAGNOSIS — Z1231 Encounter for screening mammogram for malignant neoplasm of breast: Secondary | ICD-10-CM | POA: Diagnosis not present

## 2022-02-09 DIAGNOSIS — E782 Mixed hyperlipidemia: Secondary | ICD-10-CM | POA: Diagnosis not present

## 2022-02-09 DIAGNOSIS — M0579 Rheumatoid arthritis with rheumatoid factor of multiple sites without organ or systems involvement: Secondary | ICD-10-CM | POA: Diagnosis not present

## 2022-02-09 DIAGNOSIS — E039 Hypothyroidism, unspecified: Secondary | ICD-10-CM | POA: Diagnosis not present

## 2022-02-09 DIAGNOSIS — E119 Type 2 diabetes mellitus without complications: Secondary | ICD-10-CM | POA: Diagnosis not present

## 2022-02-09 DIAGNOSIS — I25111 Atherosclerotic heart disease of native coronary artery with angina pectoris with documented spasm: Secondary | ICD-10-CM | POA: Diagnosis not present

## 2022-02-09 DIAGNOSIS — G4733 Obstructive sleep apnea (adult) (pediatric): Secondary | ICD-10-CM | POA: Diagnosis not present

## 2022-02-09 DIAGNOSIS — L4050 Arthropathic psoriasis, unspecified: Secondary | ICD-10-CM | POA: Diagnosis not present

## 2022-02-09 DIAGNOSIS — I10 Essential (primary) hypertension: Secondary | ICD-10-CM | POA: Diagnosis not present

## 2022-02-25 DIAGNOSIS — Z79899 Other long term (current) drug therapy: Secondary | ICD-10-CM | POA: Diagnosis not present

## 2022-02-25 DIAGNOSIS — M159 Polyosteoarthritis, unspecified: Secondary | ICD-10-CM | POA: Diagnosis not present

## 2022-02-25 DIAGNOSIS — L4050 Arthropathic psoriasis, unspecified: Secondary | ICD-10-CM | POA: Diagnosis not present

## 2022-04-06 DIAGNOSIS — C73 Malignant neoplasm of thyroid gland: Secondary | ICD-10-CM | POA: Diagnosis not present

## 2022-04-13 DIAGNOSIS — C73 Malignant neoplasm of thyroid gland: Secondary | ICD-10-CM | POA: Diagnosis not present

## 2022-06-09 DIAGNOSIS — Z79899 Other long term (current) drug therapy: Secondary | ICD-10-CM | POA: Diagnosis not present

## 2022-06-09 DIAGNOSIS — M159 Polyosteoarthritis, unspecified: Secondary | ICD-10-CM | POA: Diagnosis not present

## 2022-06-09 DIAGNOSIS — L4050 Arthropathic psoriasis, unspecified: Secondary | ICD-10-CM | POA: Diagnosis not present

## 2022-06-15 DIAGNOSIS — E119 Type 2 diabetes mellitus without complications: Secondary | ICD-10-CM | POA: Diagnosis not present

## 2022-06-15 DIAGNOSIS — I10 Essential (primary) hypertension: Secondary | ICD-10-CM | POA: Diagnosis not present

## 2022-06-15 DIAGNOSIS — E039 Hypothyroidism, unspecified: Secondary | ICD-10-CM | POA: Diagnosis not present

## 2022-06-15 DIAGNOSIS — G4733 Obstructive sleep apnea (adult) (pediatric): Secondary | ICD-10-CM | POA: Diagnosis not present

## 2022-06-15 DIAGNOSIS — E538 Deficiency of other specified B group vitamins: Secondary | ICD-10-CM | POA: Diagnosis not present

## 2022-06-15 DIAGNOSIS — M25511 Pain in right shoulder: Secondary | ICD-10-CM | POA: Diagnosis not present

## 2022-06-15 DIAGNOSIS — Z0000 Encounter for general adult medical examination without abnormal findings: Secondary | ICD-10-CM | POA: Diagnosis not present

## 2022-06-15 DIAGNOSIS — E782 Mixed hyperlipidemia: Secondary | ICD-10-CM | POA: Diagnosis not present

## 2022-06-15 DIAGNOSIS — L4050 Arthropathic psoriasis, unspecified: Secondary | ICD-10-CM | POA: Diagnosis not present

## 2022-06-15 DIAGNOSIS — I25111 Atherosclerotic heart disease of native coronary artery with angina pectoris with documented spasm: Secondary | ICD-10-CM | POA: Diagnosis not present

## 2022-06-15 DIAGNOSIS — M0579 Rheumatoid arthritis with rheumatoid factor of multiple sites without organ or systems involvement: Secondary | ICD-10-CM | POA: Diagnosis not present

## 2022-06-15 DIAGNOSIS — C73 Malignant neoplasm of thyroid gland: Secondary | ICD-10-CM | POA: Diagnosis not present

## 2022-06-23 DIAGNOSIS — M25511 Pain in right shoulder: Secondary | ICD-10-CM | POA: Diagnosis not present

## 2022-06-23 DIAGNOSIS — M25311 Other instability, right shoulder: Secondary | ICD-10-CM | POA: Diagnosis not present

## 2022-06-23 DIAGNOSIS — E119 Type 2 diabetes mellitus without complications: Secondary | ICD-10-CM | POA: Diagnosis not present

## 2022-06-23 DIAGNOSIS — Z6841 Body Mass Index (BMI) 40.0 and over, adult: Secondary | ICD-10-CM | POA: Diagnosis not present

## 2022-06-23 DIAGNOSIS — S46001A Unspecified injury of muscle(s) and tendon(s) of the rotator cuff of right shoulder, initial encounter: Secondary | ICD-10-CM | POA: Diagnosis not present

## 2022-06-24 ENCOUNTER — Encounter: Payer: Self-pay | Admitting: Orthopedic Surgery

## 2022-06-24 ENCOUNTER — Other Ambulatory Visit: Payer: Self-pay | Admitting: Orthopedic Surgery

## 2022-06-24 DIAGNOSIS — M25511 Pain in right shoulder: Secondary | ICD-10-CM

## 2022-06-29 ENCOUNTER — Ambulatory Visit
Admission: RE | Admit: 2022-06-29 | Discharge: 2022-06-29 | Disposition: A | Discharge status: Home / Self Care | Payer: PPO | Source: Ambulatory Visit | Attending: Physician Assistant | Admitting: Physician Assistant

## 2022-06-29 DIAGNOSIS — Z1231 Encounter for screening mammogram for malignant neoplasm of breast: Secondary | ICD-10-CM | POA: Diagnosis not present

## 2022-07-02 ENCOUNTER — Ambulatory Visit
Admission: RE | Admit: 2022-07-02 | Discharge: 2022-07-02 | Disposition: A | Discharge status: Home / Self Care | Payer: PPO | Source: Ambulatory Visit | Attending: Orthopedic Surgery | Admitting: Orthopedic Surgery

## 2022-07-02 DIAGNOSIS — M25511 Pain in right shoulder: Secondary | ICD-10-CM

## 2022-07-15 DIAGNOSIS — E119 Type 2 diabetes mellitus without complications: Secondary | ICD-10-CM | POA: Diagnosis not present

## 2022-07-15 DIAGNOSIS — Z7982 Long term (current) use of aspirin: Secondary | ICD-10-CM | POA: Diagnosis not present

## 2022-07-15 DIAGNOSIS — I10 Essential (primary) hypertension: Secondary | ICD-10-CM | POA: Diagnosis not present

## 2022-07-15 DIAGNOSIS — E785 Hyperlipidemia, unspecified: Secondary | ICD-10-CM | POA: Diagnosis not present

## 2022-07-15 DIAGNOSIS — Z87891 Personal history of nicotine dependence: Secondary | ICD-10-CM | POA: Diagnosis not present

## 2022-07-15 DIAGNOSIS — I2510 Atherosclerotic heart disease of native coronary artery without angina pectoris: Secondary | ICD-10-CM | POA: Diagnosis not present

## 2022-07-15 DIAGNOSIS — I700 Atherosclerosis of aorta: Secondary | ICD-10-CM | POA: Diagnosis not present

## 2022-07-20 ENCOUNTER — Other Ambulatory Visit: Payer: Self-pay | Admitting: Orthopedic Surgery

## 2022-07-20 DIAGNOSIS — M75121 Complete rotator cuff tear or rupture of right shoulder, not specified as traumatic: Secondary | ICD-10-CM | POA: Diagnosis not present

## 2022-07-26 ENCOUNTER — Other Ambulatory Visit: Payer: Self-pay

## 2022-07-26 ENCOUNTER — Encounter
Admission: RE | Admit: 2022-07-26 | Discharge: 2022-07-26 | Disposition: A | Discharge status: Home / Self Care | Payer: PPO | Source: Ambulatory Visit | Attending: Orthopedic Surgery | Admitting: Orthopedic Surgery

## 2022-07-26 VITALS — Ht 64.0 in | Wt 245.0 lb

## 2022-07-26 DIAGNOSIS — E119 Type 2 diabetes mellitus without complications: Secondary | ICD-10-CM

## 2022-07-26 DIAGNOSIS — I10 Essential (primary) hypertension: Secondary | ICD-10-CM

## 2022-07-26 HISTORY — DX: Unspecified asthma, uncomplicated: J45.909

## 2022-07-26 NOTE — Patient Instructions (Addendum)
Your procedure is scheduled on: Tuesday Aug 02, 2022. Report to the Registration Desk on the 1st floor of the [facility]. To find out your arrival time, please call [phone] between 1PM - 3PM on: Monday Aug 01, 2022. If your arrival time is 6:00 am, do not arrive before that time as the [facility] entrance doors do not open until 6:00 am.  REMEMBER: Instructions that are not followed completely may result in serious medical risk, up to and including death; or upon the discretion of your surgeon and anesthesiologist your surgery may need to be rescheduled.  Do not eat food after midnight the night before surgery.  No gum chewing or hard candies.  You may however, drink CLEAR liquids up to 2 hours before you are scheduled to arrive for your surgery. Do not drink anything within 2 hours of your scheduled arrival time.  Clear liquids include: - water   Type 1 and Type 2 diabetics should only drink water.  In addition, your doctor has ordered for you to drink the provided:  Gatorade G2 Drinking this carbohydrate drink up to two hours before surgery helps to reduce insulin resistance and improve patient outcomes. Please complete drinking 2 hours before scheduled arrival time.  One week prior to surgery: Stop Anti-inflammatories (NSAIDS) such as Advil, Aleve, Ibuprofen, Motrin, Naproxen, Naprosyn and Aspirin based products such as Excedrin, Goody's Powder, BC Powder. Stop ANY OVER THE COUNTER SUPPLEMENTS and VITAMINS until after surgery. You may however, continue to take Tylenol if needed for pain up until the day of surgery.  Continue taking all prescribed medications with the exception of the following:  Follow recommendations from Cardiologist or PCP regarding stopping blood thinners.  TAKE ONLY THESE MEDICATIONS THE MORNING OF SURGERY WITH A SIP OF WATER:  isosorbide mononitrate (IMDUR) 60 MG 24 hr levothyroxine (SYNTHROID, LEVOTHROID) 100 MCG  pantoprazole (PROTONIX) 40 MG  Antacid (take one the night before and one on the morning of surgery - helps to prevent nausea after surgery.)  Use inhalers on the day of surgery and bring to the hospital. fluticasone furoate-vilanterol (BREO ELLIPTA) 100-25 MCG/INH AEPB  albuterol (PROVENTIL HFA;VENTOLIN HFA) 108 (90 Base) MCG/ACT inhaler   No Alcohol for 24 hours before or after surgery.  No Smoking including e-cigarettes for 24 hours before surgery.  No chewable tobacco products for at least 6 hours before surgery.  No nicotine patches on the day of surgery.  Do not use any "recreational" drugs for at least a week (preferably 2 weeks) before your surgery.  Please be advised that the combination of cocaine and anesthesia may have negative outcomes, up to and including death. If you test positive for cocaine, your surgery will be cancelled.  On the morning of surgery brush your teeth with toothpaste and water, you may rinse your mouth with mouthwash if you wish. Do not swallow any toothpaste or mouthwash.  Use CHG Soap or wipes as directed on instruction sheet.  Do not wear jewelry, make-up, hairpins, clips or nail polish.  Do not wear lotions, powders, or perfumes.   Do not shave body hair from the neck down 48 hours before surgery.  Contact lenses, hearing aids and dentures may not be worn into surgery.  Do not bring valuables to the hospital. [facility] is not responsible for any missing/lost belongings or valuables.   Notify your doctor if there is any change in your medical condition (cold, fever, infection).  Wear comfortable clothing (specific to your surgery type) to  the hospital.  After surgery, you can help prevent lung complications by doing breathing exercises.  Take deep breaths and cough every 1-2 hours. Your doctor may order a device called an Incentive Spirometer to help you take deep breaths. When coughing or sneezing, hold a pillow firmly against your incision with both hands. This is called  "splinting." Doing this helps protect your incision. It also decreases belly discomfort.  If you are being admitted to the hospital overnight, leave your suitcase in the car. After surgery it may be brought to your room.  In case of increased patient census, it may be necessary for you, the patient, to continue your postoperative care in the Same Day Surgery department.  If you are being discharged the day of surgery, you will not be allowed to drive home. You will need a responsible individual to drive you home and stay with you for 24 hours after surgery.   If you are taking public transportation, you will need to have a responsible individual with you.  Please call the Pre-admissions Testing Dept. at [phone] if you have any questions about these instructions.  Surgery Visitation Policy:  Patients having surgery or a procedure may have two visitors.  Children under the age of 18 must have an adult with them who is not the patient.  Inpatient Visitation:    Visiting hours are 7 a.m. to 8 p.m. Up to four visitors are allowed at one time in a patient room. The visitors may rotate out with other people during the day.  One visitor age 6 or older may stay with the patient overnight and must be in the room by 8 p.m.   Preparing for Surgery with CHLORHEXIDINE GLUCONATE (CHG) Soap  Chlorhexidine Gluconate (CHG) Soap  o An antiseptic cleaner that kills germs and bonds with the skin to continue killing germs even after washing  o Used for showering the night before surgery and morning of surgery  Before surgery, you can play an important role by reducing the number of germs on your skin.  CHG (Chlorhexidine gluconate) soap is an antiseptic cleanser which kills germs and bonds with the skin to continue killing germs even after washing.  Please do not use if you have an allergy to CHG or antibacterial soaps. If your skin becomes reddened/irritated stop using the CHG.  1. Shower  the NIGHT BEFORE SURGERY and the MORNING OF SURGERY with CHG soap.  2. If you choose to wash your hair, wash your hair first as usual with your normal shampoo.  3. After shampooing, rinse your hair and body thoroughly to remove the shampoo.  4. Use CHG as you would any other liquid soap. You can apply CHG directly to the skin and wash gently with a scrungie or a clean washcloth.  5. Apply the CHG soap to your body only from the neck down. Do not use on open wounds or open sores. Avoid contact with your eyes, ears, mouth, and genitals (private parts). Wash face and genitals (private parts) with your normal soap.  6. Wash thoroughly, paying special attention to the area where your surgery will be performed.  7. Thoroughly rinse your body with warm water.  8. Do not shower/wash with your normal soap after using and rinsing off the CHG soap.  9. Pat yourself dry with a clean towel.  10. Wear clean pajamas to bed the night before surgery.  12. Place clean sheets on your bed the night of your first shower and  do not sleep with pets.  13. Shower again with the CHG soap on the day of surgery prior to arriving at the hospital.  14. Do not apply any deodorants/lotions/powders.  15. Please wear clean clothes to the hospital.

## 2022-07-27 ENCOUNTER — Encounter
Admission: RE | Admit: 2022-07-27 | Discharge: 2022-07-27 | Disposition: A | Discharge status: Home / Self Care | Payer: PPO | Source: Ambulatory Visit | Attending: Orthopedic Surgery | Admitting: Orthopedic Surgery

## 2022-07-27 DIAGNOSIS — R001 Bradycardia, unspecified: Secondary | ICD-10-CM | POA: Diagnosis not present

## 2022-07-27 DIAGNOSIS — E119 Type 2 diabetes mellitus without complications: Secondary | ICD-10-CM

## 2022-07-27 DIAGNOSIS — I10 Essential (primary) hypertension: Secondary | ICD-10-CM | POA: Diagnosis not present

## 2022-07-27 DIAGNOSIS — Z01810 Encounter for preprocedural cardiovascular examination: Secondary | ICD-10-CM

## 2022-07-27 DIAGNOSIS — Z01818 Encounter for other preprocedural examination: Secondary | ICD-10-CM | POA: Insufficient documentation

## 2022-07-27 LAB — BASIC METABOLIC PANEL
Anion gap: 7 (ref 5–15)
BUN: 29 mg/dL — ABNORMAL HIGH (ref 8–23)
CO2: 29 mmol/L (ref 22–32)
Calcium: 9 mg/dL (ref 8.9–10.3)
Chloride: 102 mmol/L (ref 98–111)
Creatinine, Ser: 0.97 mg/dL (ref 0.44–1.00)
GFR, Estimated: >60 mL/min (ref >60)
Glucose, Bld: 123 mg/dL — ABNORMAL HIGH (ref 70–99)
Potassium: 3.6 mmol/L (ref 3.5–5.1)
Sodium: 138 mmol/L (ref 135–145)

## 2022-07-29 ENCOUNTER — Encounter: Payer: Self-pay | Admitting: Orthopedic Surgery

## 2022-07-29 NOTE — Progress Notes (Signed)
Perioperative / Anesthesia Services  Pre-Admission Testing Clinical Review / Preoperative Anesthesia Consult  Date: 07/29/22  Patient Demographics:  Name: Jacqueline Rhodes DOB:   [birth year] MRN:   [id number]  Planned Surgical Procedure(s):    Case: [id number] Date/Time: 08/02/22 1047   Procedure: Right shoulder arthroscopic vs mini-open rotator cuff repair, subacromial decompression, and distal clavicle excision (Right)   Anesthesia type: Choice   Pre-op diagnosis: Complete tear of right rotator cuff, unspecified whether traumatic M75.121   Location: [facility] OR ROOM 01 / [facility]   Surgeons: Signa Kell, MD     NOTE: Available PAT nursing documentation and vital signs have been reviewed. Clinical nursing staff has updated patient's PMH/PSHx, current medication list, and drug allergies/intolerances to ensure comprehensive history available to assist in medical decision making as it pertains to the aforementioned surgical procedure and anticipated anesthetic course. Extensive review of available clinical information personally performed. [place] [facility] and PSHx updated with any diagnoses/procedures that  may have been inadvertently omitted during her intake with the pre-admission testing department's nursing staff.  Clinical Discussion:  Jacqueline Rhodes is a 67 y.o. female who is submitted for pre-surgical anesthesia review and clearance prior to her undergoing the above procedure. Patient is a Former Smoker (15 pack years; quit 03/2003). Pertinent PMH includes: CAD, NSTEMI, CHF, PVD, aortic atherosclerosis, angina, HTN, HLD, T2DM, hypothyroidism (s/p hemithyroidectomy for thyroid cancer), renal insufficiency, asthma, COPD, OSAH (noncompliant with nocturnal PAP therapy), GERD (on daily PPI), anemia, OA, RA, psoriatic arthritis, lymphedema, RIGHT rotator cuff tear  Patient is followed by cardiology Lorelle Gibbs, MD). She was last seen in the cardiology clinic on 08/01/2022;  notes reviewed. At the time of her clinic visit, patient doing well overall from a cardiovascular perspective.  Patient complaining of exertional dyspnea associated with ambulation due to her underlying asthma and COPD diagnoses.  Patient reported that respiratory status was stable and at baseline.  Patient denied any chest pain, shortness of breath, PND, orthopnea, palpitations, significant peripheral edema, weakness, fatigue, vertiginous symptoms, or presyncope/syncope. Patient with a past medical history significant for cardiovascular diagnoses. Documented physical exam was grossly benign, providing no evidence of acute exacerbation and/or decompensation of the patient's known cardiovascular conditions.  Patient suffered an NSTEMI on 01/06/2005. Diagnostic LEFT heart catheterization was performed on 01/07/2005 revealing multivessel CAD; 60% proximal LAD, 70% D1, 30% proximal LCx, 20% proximal RCA, and 25% distal RCA.  Cardiology made the decision to defer intervention opting for medical management.  Patient with recurrent unstable angina.  She underwent repeat diagnostic LEFT heart catheterization on 01/10/2008 revealing multivessel CAD; 25% proximal LAD-1, 60% proximal LAD-2, 70% D1, 20% proximal LCx, and 20% mid RCA.  FFR analysis was performed by interventional cardiology, with results demonstrating no flow-limiting stenosis.  The decision was made to defer intervention opting for continued medical management.  Persistent unstable angina prompted repeat LEFT heart catheterization on 01/04/2012.  Procedure again revealed multivessel CAD; 40% proximal LAD-1, 60% proximal LAD-2, 70% D1, 25% proximal LCx, 20% proximal RCA, 20% mid RCA, and 25% distal RCA.  FFR analysis did not reveal any significant stenosis, thus intervention was deferred and medical management of coronary artery disease was recommended by cardiology.  Repeat diagnostic LEFT heart catheterization performed on 01/10/2018 revealing  multivessel CAD; 20% proximal RCA, 50% proximal LAD, and 55% ostial D1.  Given the nonobstructive nature of her coronary artery disease on exam, coupled with past FFR analyses revealing no significant stenoses, the decision was made to defer intervention  opting for continued aggressive medical management of the patient's known coronary artery disease.  Most recent TTE was performed on 05/06/2020 revealing normal left ventricular systolic function with an EF of >55%.  There were no regional wall motion abnormalities.  Right ventricular size and function normal.  There was trivial aortic, mitral, and tricuspid valve regurgitation.  All transvalvular gradients were noted to be normal providing no evidence suggestive of valvular stenosis.  Most recent myocardial perfusion imaging study was performed on 05/06/2020 revealing a normal left ventricular systolic function with a hyperdynamic LVEF of 67%.  There were no regional wall motion abnormalities.  There was no evidence of stress-induced myocardial ischemia or arrhythmia; no scintigraphic evidence of scar.  Study determined to be normal low risk.  Blood pressure well controlled at 118/70 mmHg on currently prescribed diuretic (furosemide + HCTZ), nitrate (isosorbide mononitrate) and ARB (losartan) therapies.  Patient is on pravastatin for her HLD diagnosis and ASCVD prevention. T2DM well controlled on currently prescribed regimen; last HgbA1c was 6.8% when checked on 06/15/2022. She does ave an OSAH diagnosis, however is reported to be noncompliant with prescribed nocturnal PAP therapy.  Functional capacity is limited by chronic pain, ambulation difficulties, asthma/COPD causing DOE, and her other various multiple medical comorbidities.  With that being said, patient is not felt to be able to achieve 4 METS of physical activity without experiencing, at least to some degree, significant angina/anginal equivalent symptoms.  No changes were made to her medication  regimen.  Patient to follow-up with outpatient cardiology in 1 year or sooner if needed.  Jacqueline Rhodes is scheduled for an elective RIGHT SHOULDER ARTHROSCOPIC VS MINI-OPEN ROTATOR CUFF REPAIR, SUBACROMIAL DECOMPRESSION, AND DISTAL CLAVICLE EXCISION on 08/02/2022 with Dr. Signa Kell, MD.  Given patient's past medical history significant for cardiovascular diagnoses, presurgical cardiac clearance was sought by the PAT team. Per cardiology, "this patient is optimized for surgery and may proceed with the planned procedural course with a MODERATE risk of significant perioperative cardiovascular complications".    In review of her medication reconciliation, it is noted that patient is currently on prescribed daily antithrombotic therapy.  Given her cardiovascular history, cardiology has asked that patient continue her daily low-dose ASA throughout her perioperative course.  Patient denies previous perioperative complications with anesthesia in the past. In review of the available records, it is noted that patient underwent a neuraxial anesthetic course here at [facility] (ASA III) in 05/2018 without documented complications.      07/26/2022    2:41 PM 06/01/2018    9:59 AM 06/01/2018    9:40 AM  Vitals with BMI  Height 5\' 4"     Weight 245 lbs    BMI 42.03    Systolic  125 142  Diastolic  51 66  Pulse  89 102    Providers/Specialists:   NOTE: Primary physician provider listed below. Patient may have been seen by APP or partner within same practice.   PROVIDER ROLE / SPECIALTY LAST Sarina Ser, MD Orthopedics (Surgeon) 07/20/2022  Patrice Paradise, MD Primary Care Provider 06/15/2022  Tiajuana Amass, MD Cardiology 07/15/2022  Verdis Frederickson, MD Endocrinology 04/13/2022  Gerrie Nordmann, MD Rheumatology 06/09/2022   Allergies:  Estrogens, Codeine, Flexeril [cyclobenzaprine], Penicillins, Terfenadine, and Vicodin  [hydrocodone-acetaminophen]  Current Home Medications:   No current facility-administered medications for this encounter.    albuterol (PROVENTIL HFA;VENTOLIN HFA) 108 (90 Base) MCG/ACT inhaler   aspirin EC 81 MG tablet   Calcium Fructoborate  POWD   Cetirizine HCl 10 MG CAPS   Cholecalciferol 25 MCG (1000 UT) TBDP   Coenzyme Q10 (COQ-10) 100 MG CAPS   Continuous Glucose Sensor (FREESTYLE LIBRE 2 SENSOR) MISC   cyanocobalamin (,VITAMIN B-12,) 1000 MCG/ML injection   dexlansoprazole (DEXILANT) 60 MG capsule   enoxaparin (LOVENOX) 40 MG/0.4ML injection   fluticasone furoate-vilanterol (BREO ELLIPTA) 100-25 MCG/INH AEPB   folic acid (FOLVITE) 1 MG tablet   furosemide (LASIX) 20 MG tablet   glucose blood (ACCU-CHEK COMPACT PLUS) test strip   hydrochlorothiazide (HYDRODIURIL) 25 MG tablet   ibuprofen (ADVIL) 800 MG tablet   isosorbide mononitrate (IMDUR) 60 MG 24 hr tablet   levothyroxine (SYNTHROID) 150 MCG tablet   levothyroxine (SYNTHROID, LEVOTHROID) 100 MCG tablet   losartan (COZAAR) 100 MG tablet   meclizine (ANTIVERT) 25 MG tablet   metaxalone (SKELAXIN) 800 MG tablet   methocarbamol (ROBAXIN) 500 MG tablet   methotrexate (RHEUMATREX) 2.5 MG tablet   Methotrexate, Anti-Rheumatic, (RHEUMATREX PO)   Multiple Vitamin (VITAMIN E/FOLIC ACID/B-6/B-12 PO)   mupirocin ointment (BACTROBAN) 2 %   nortriptyline (PAMELOR) 10 MG capsule   oxyCODONE (OXY IR/ROXICODONE) 5 MG immediate release tablet   pantoprazole (PROTONIX) 40 MG tablet   pravastatin (PRAVACHOL) 20 MG tablet   Secukinumab (COSENTYX SENSOREADY 300 DOSE) 150 MG/ML SOAJ   simvastatin (ZOCOR) 40 MG tablet   Thiamine Mononitrate POWD   triamcinolone ointment (KENALOG) 0.1 %   History:   Past Medical History:  Diagnosis Date   Anemia    Anginal pain (HCC)    Aortic atherosclerosis (HCC)    Asthma    B12 deficiency    Carpal tunnel syndrome    CHF (congestive heart failure) (HCC)    Chronic venous insufficiency     Complete tear of right rotator cuff    COPD (chronic obstructive pulmonary disease) (HCC)    Coronary artery disease 01/07/2005   a.) LHC 01/07/2005 (NSTEMI): LHC 60% pLAD, 70% D1, 30% pLCx, 20% pRCA, 25% dRCA - med mgmt; b.) LHC ([place]) 01/10/2008: 25/60% pLAD, 70% D1, 20% pLCx, 20% mRCA --> FFR normal --> med mgmt; c.) LHC ([place]) 01/04/2012:: 40/60% pLAD, 70% D1, 25% pLCx, 20% pRCA, 20% mRCA, 25% dRCA --> FFR normal --> med mgmt; d.) LHC ([place]) 01/10/2018: 20% pRCA, 50% pLAD, 55% oD1 -- med mgmt   Diverticulosis    DOE (dyspnea on exertion)    GERD (gastroesophageal reflux disease)    History of bilateral cataract extraction 2014   Hyperlipidemia    Hypertension    Hypothyroidism    a.) s/p LEFT hemithyroidectomy; on levothyroxine   Long term current use of aspirin    Long term current use of immunosuppressive drug    a.) MTX + secukinumab for RA/psoriatric arthritis diagnoses   Lower extremity edema    Lymphedema    NSTEMI (non-ST elevated myocardial infarction) (HCC) 01/06/2005   a.) LHC 01/07/2005: 60% pLAD, 70% D1, 30% pLCx, 20% pRCA, 25% dRCA -- med mgmt   OSA on CPAP    a.) compliance issues with prescribed nocturnal PAP therapy   Osteoarthritis    Peripheral vascular disease (HCC)    Post-menopausal osteoporosis    Psoriatic arthritis (HCC)    RA (rheumatoid arthritis) (HCC)    Renal insufficiency    T2DM (type 2 diabetes mellitus) (HCC)    Thyroid cancer (HCC)    a.) s/p LEFT hemithyroidectomy 08/2015   Past Surgical History:  Procedure Laterality Date   ABDOMINAL HYSTERECTOMY  2003   BIOPSY  THYROID  2017   x 2   BREAST BIOPSY Left 2000   benign   BREAST BIOPSY Right 2015   benign   CATARACT EXTRACTION, BILATERAL  2014   left ankle surgery  2001   LEFT HEART CATH AND CORONARY ANGIOGRAPHY Left 01/10/2018   Procedure: LEFT HEART CATH AND CORONARY ANGIOGRAPHY;  Surgeon: Lamar Blinks, MD;  Location: [facility] INVASIVE CV LAB;  Service: Cardiovascular;  Laterality: Left;    LEFT HEART CATH AND CORONARY ANGIOGRAPHY Left 01/07/2005   Procedure: LEFT HEART CATH AND CORONARY ANGIOGRAPHY; Location: [facility]; Surgeon: Arnoldo Hooker, MD   LEFT HEART CATH AND CORONARY ANGIOGRAPHY Left 01/10/2008   Procedure: LEFT HEART CATH AND CORONARY ANGIOGRAPHY; Location: [facility]; Surgeon: Arnoldo Hooker, MD / Rudean Hitt, MD   LEFT HEART CATH AND CORONARY ANGIOGRAPHY Left 01/04/2012   Procedure: LEFT HEART CATH AND CORONARY ANGIOGRAPHY; Location: [facility]; Surgeon: Arnoldo Hooker, MD / Marcina Millard, MD   left shoulder surgery  1996   right elbow surgery  1999   THYROIDECTOMY, PARTIAL Left 09/05/2015   Procedure: LEFT HEMITHYROIDECTOMY   TOTAL KNEE ARTHROPLASTY Right 05/29/2018   Procedure: TOTAL KNEE ARTHROPLASTY-RIGHT;  Surgeon: Kennedy Bucker, MD;  Location: [facility] ORS;  Service: Orthopedics;  Laterality: Right;   VEIN SURGERY Bilateral 2015   legs   Family History  Problem Relation Age of Onset   Breast cancer Cousin        paternal side   Breast cancer Paternal Aunt        2 PATs   Breast cancer Maternal Aunt    Kidney failure Sister    Diabetes Sister    Diabetes Mother    Kidney cancer Neg Hx    Bladder Cancer Neg Hx    Social History   Tobacco Use   Smoking status: Former    Packs/day: 0.50    Years: 30.00    Additional pack years: 0.00    Total pack years: 15.00    Types: Cigarettes    Quit date: 2005    Years since quitting: 19.3   Smokeless tobacco: Never  Vaping Use   Vaping Use: Never used  Substance Use Topics   Alcohol use: No   Drug use: No    Pertinent Clinical Results:  LABS:   Component Ref Range & Units 1 mo ago  WBC (White Blood Cell Count) 4.1 - 10.2 10^3/uL 5.4  RBC (Red Blood Cell Count) 4.04 - 5.48 10^6/uL 4.20  Hemoglobin 12.0 - 15.0 gm/dL 16.1  Hematocrit 09.6 - 47.0 % 38.3  MCV (Mean Corpuscular Volume) 80.0 - 100.0 fl 91.2  MCH (Mean Corpuscular Hemoglobin) 27.0 - 31.2 pg 30.5  MCHC (Mean Corpuscular Hemoglobin  Concentration) 32.0 - 36.0 gm/dL 04.5  Platelet Count 409 - 450 10^3/uL 230  RDW-CV (Red Cell Distribution Width) 11.6 - 14.8 % 14.8  MPV (Mean Platelet Volume) 9.4 - 12.4 fl 10.1  Neutrophils 1.50 - 7.80 10^3/uL 3.10  Lymphocytes 1.00 - 3.60 10^3/uL 1.42  Monocytes 0.00 - 1.50 10^3/uL 0.42  Eosinophils 0.00 - 0.55 10^3/uL 0.36  Basophils 0.00 - 0.09 10^3/uL 0.06  Neutrophil % 32.0 - 70.0 % 57.8  Lymphocyte % 10.0 - 50.0 % 26.4  Monocyte % 4.0 - 13.0 % 7.8  Eosinophil % 1.0 - 5.0 % 6.7 High   Basophil% 0.0 - 2.0 % 1.1  Immature Granulocyte % <=0.7 % 0.2  Immature Granulocyte Count <=0.06 10^3/L 0.01  Resulting Agency [facility]  Specimen Collected: 06/09/22 09:28  Performed by: [facility] Last Resulted: 06/09/22 10:27  Received From: [facility]  Result Received: 06/24/22 09:18   Component Ref Range & Units 06/15/2022  Hemoglobin A1C 4.2 - 5.6 % 6.8 High   Average Blood Glucose (Calc) mg/dL 130  Resulting Agency    Specimen Collected: 06/15/22 10:20   Performed by: [facility] Last Resulted: 06/15/22 11:28  Received From: [facility]  Result Received: 06/24/22 09:18   Hospital Outpatient Visit on 07/27/2022  Component Date Value Ref Range Status   Sodium 07/27/2022 138  135 - 145 mmol/L Final   Potassium 07/27/2022 3.6  3.5 - 5.1 mmol/L Final   Chloride 07/27/2022 102  98 - 111 mmol/L Final   CO2 07/27/2022 29  22 - 32 mmol/L Final   Glucose, Bld 07/27/2022 123 (H)  70 - 99 mg/dL Final   Glucose reference range applies only to samples taken after fasting for at least 8 hours.   BUN 07/27/2022 29 (H)  8 - 23 mg/dL Final   Creatinine, Ser 07/27/2022 0.97  0.44 - 1.00 mg/dL Final   Calcium 86/57/8469 9.0  8.9 - 10.3 mg/dL Final   GFR, Estimated 07/27/2022 >60  >60 mL/min Final   Comment: (NOTE) Calculated using the CKD-EPI Creatinine Equation (2021)    Anion gap 07/27/2022  7  5 - 15 Final   Performed at [facility], [address] [place], [place] [address]    ECG: Date: 07/27/2022 Time ECG obtained: 0840 AM Rate: 56 bpm Rhythm: sinus bradycardia Axis (leads I and aVF): Normal Intervals: PR 200 ms. QRS 82 ms. QTc 432 ms. ST segment and T wave changes: No evidence of acute ST segment elevation or depression Comparison: Similar to previous tracing obtained on 12/08/2021   IMAGING / PROCEDURES: MR SHOULDER RIGHT WO CONTRAST performed on 07/02/2022 Complete full-thickness retracted tear of the supraspinatus tendon. Tear also involves the majority of the infraspinatus tendon. Supraspinatus and infraspinatus muscle atrophy. Subscapularis tendinosis without discrete tear. Long head biceps tendon is not definitively seen and may be torn. Moderate glenohumeral and mild AC joint osteoarthritis.   DIAGNOSTIC RADIOGRAPHS OF SHOULDER RIGHT MINIMUM 2 VIEWS performed on 06/23/2022 Positive AC joint inferior cramming osteophyte formation.   There is no high riding humeral head.   There is no glenohumeral degenerative changes seen.   No fracture or dislocation seen.     PULMONARY FUNCTION TESTING performed on 04/30/2021 SPIROMETRY: FVC was 2.30 Liters, 80% of predicted FEV1 was 1.82 Liters, 80% of predicted FEV1 ratio was 78.84%, 100% of predicted FEF 25-75% Liters per second was 1.75, 79% of predicted DIFFUSION CAPACITY: DLCO was 20.65 mL/min/mmHg VA was 4.49 Liters DLCO/VA was 4.60 mL/min/mmHg/L LUNG VOLUMES: TLC was 4.68 Liters RV was 2.43 Liters FLOW VOLUME LOOP: Normal IMPRESSION: Spirometry is in low normal range ? Early restriction Lung volumes and diffusion capacity is normal   MYOCARDIAL PERFUSION IMAGING STUDY (LEXISCAN) performed on 05/06/2020 Normal left ventricular systolic function with a hyperdynamic LVEF 67% Normal myocardial thickening and wall motion No artifact Left ventricular cavity size normal No evidence of  stress-induced myocardial ischemia or arrhythmia; no scintigraphic evidence of scar Normal low risk study  TRANSTHORACIC ECHOCARDIOGRAM performed on 05/06/2020 Normal left ventricular systolic function with an EF of >55% No regional wall motion abnormalities Right ventricular size and function normal Trivial aortic, mitral, and tricuspid valve regurgitation Normal gradients; no valvular stenosis No pericardial effusion  LEFT HEART CATHETERIZATION AND CORONARY  ANGIOGRAPHY performed on 01/10/2018 Normal left ventricular systolic function with EF of 55% Multivessel CAD 20% proximal RCA 50% proximal LAD 55% ostial D1 Recommendations Medical management of CAD as risk factors and additional medications for management of angina Daily low-dose ASA for moderate CAD    Impression and Plan:  Jacqueline Rhodes has been referred for pre-anesthesia review and clearance prior to her undergoing the planned anesthetic and procedural courses. Available labs, pertinent testing, and imaging results were personally reviewed by me in preparation for upcoming operative/procedural course. [facility] medical record has been updated following extensive record review and patient interview with PAT staff.   This patient has been appropriately cleared by cardiology with an overall MODERATE risk of significant perioperative cardiovascular complications. Based on clinical review performed today (07/29/22), barring any significant acute changes in the patient's overall condition, it is anticipated that she will be able to proceed with the planned surgical intervention. Any acute changes in clinical condition may necessitate her procedure being postponed and/or cancelled. Patient will meet with anesthesia team (MD and/or CRNA) on the day of her procedure for preoperative evaluation/assessment. Questions regarding anesthetic course will be fielded at that time.   Pre-surgical instructions were reviewed with the patient  during her PAT appointment, and questions were fielded to satisfaction by PAT clinical staff. She has been instructed on which medications that she will need to hold prior to surgery, as well as the ones that have been deemed safe/appropriate to take on the day of her procedure. As part of the general education provided by PAT, patient made aware both verbally and in writing, that she would need to abstain from the use of any illegal substances during her perioperative course.  She was advised that failure to follow the provided instructions could necessitate case cancellation or result in serious perioperative complications up to and including death. Patient encouraged to contact PAT and/or her surgeon's office to discuss any questions or concerns that may arise prior to surgery; verbalized understanding.   Quentin Mulling, MSN, APRN, FNP-C, CEN [facility]  Peri-operative Services Nurse Practitioner Phone: [phone] Fax: [phone] 07/29/22 12:30 PM  NOTE: This note has been prepared using Dragon dictation software. Despite my best ability to proofread, there is always the potential that unintentional transcriptional errors may still occur from this process.

## 2022-08-01 MED ORDER — SODIUM CHLORIDE 0.9 % IV SOLN: INTRAVENOUS | Status: DC

## 2022-08-01 MED ORDER — CHLORHEXIDINE GLUCONATE 0.12 % MT SOLN
15.0000 mL | Freq: Once | OROMUCOSAL | Status: AC
  Administered 2022-08-02: 15 mL via OROMUCOSAL

## 2022-08-01 MED ORDER — ORAL CARE MOUTH RINSE: 15.0000 mL | Freq: Once | OROMUCOSAL | Status: AC

## 2022-08-01 MED ORDER — CEFAZOLIN SODIUM-DEXTROSE 2-4 GM/100ML-% IV SOLN
2.0000 g | INTRAVENOUS | Status: AC
  Administered 2022-08-02: 2 g via INTRAVENOUS

## 2022-08-02 ENCOUNTER — Ambulatory Visit
Admission: RE | Admit: 2022-08-02 | Discharge: 2022-08-02 | Disposition: A | Discharge status: Home / Self Care | Payer: PPO | Attending: Orthopedic Surgery | Admitting: Orthopedic Surgery

## 2022-08-02 ENCOUNTER — Ambulatory Visit: Payer: PPO | Admitting: Urgent Care

## 2022-08-02 ENCOUNTER — Other Ambulatory Visit: Payer: Self-pay

## 2022-08-02 ENCOUNTER — Encounter: Payer: Self-pay | Admitting: Orthopedic Surgery

## 2022-08-02 ENCOUNTER — Encounter
Admission: RE | Disposition: A | Discharge status: Home / Self Care | Payer: Self-pay | Source: Home / Self Care | Attending: Orthopedic Surgery

## 2022-08-02 ENCOUNTER — Ambulatory Visit: Payer: PPO

## 2022-08-02 DIAGNOSIS — I10 Essential (primary) hypertension: Secondary | ICD-10-CM | POA: Diagnosis not present

## 2022-08-02 DIAGNOSIS — M7581 Other shoulder lesions, right shoulder: Secondary | ICD-10-CM | POA: Diagnosis not present

## 2022-08-02 DIAGNOSIS — M75101 Unspecified rotator cuff tear or rupture of right shoulder, not specified as traumatic: Secondary | ICD-10-CM | POA: Insufficient documentation

## 2022-08-02 DIAGNOSIS — Z87891 Personal history of nicotine dependence: Secondary | ICD-10-CM | POA: Insufficient documentation

## 2022-08-02 DIAGNOSIS — M25811 Other specified joint disorders, right shoulder: Secondary | ICD-10-CM | POA: Insufficient documentation

## 2022-08-02 DIAGNOSIS — E119 Type 2 diabetes mellitus without complications: Secondary | ICD-10-CM

## 2022-08-02 DIAGNOSIS — M19011 Primary osteoarthritis, right shoulder: Secondary | ICD-10-CM | POA: Diagnosis not present

## 2022-08-02 DIAGNOSIS — M7541 Impingement syndrome of right shoulder: Secondary | ICD-10-CM | POA: Diagnosis not present

## 2022-08-02 DIAGNOSIS — M7521 Bicipital tendinitis, right shoulder: Secondary | ICD-10-CM | POA: Diagnosis not present

## 2022-08-02 DIAGNOSIS — G8918 Other acute postprocedural pain: Secondary | ICD-10-CM | POA: Diagnosis not present

## 2022-08-02 DIAGNOSIS — M75121 Complete rotator cuff tear or rupture of right shoulder, not specified as traumatic: Secondary | ICD-10-CM | POA: Diagnosis not present

## 2022-08-02 HISTORY — DX: Arthropathic psoriasis, unspecified: L40.50

## 2022-08-02 HISTORY — DX: Diverticulosis of intestine, part unspecified, without perforation or abscess without bleeding: K57.90

## 2022-08-02 HISTORY — DX: Hypothyroidism, unspecified: E03.9

## 2022-08-02 HISTORY — DX: Type 2 diabetes mellitus without complications: E11.9

## 2022-08-02 HISTORY — DX: Carpal tunnel syndrome, unspecified upper limb: G56.00

## 2022-08-02 HISTORY — DX: Disorder of kidney and ureter, unspecified: N28.9

## 2022-08-02 HISTORY — DX: Deficiency of other specified B group vitamins: E53.8

## 2022-08-02 HISTORY — DX: Unspecified osteoarthritis, unspecified site: M19.90

## 2022-08-02 HISTORY — DX: Long term (current) use of aspirin: Z79.82

## 2022-08-02 HISTORY — DX: Obstructive sleep apnea (adult) (pediatric): G47.33

## 2022-08-02 HISTORY — DX: Other long term (current) drug therapy: Z79.899

## 2022-08-02 HISTORY — DX: Complete rotator cuff tear or rupture of right shoulder, not specified as traumatic: M75.121

## 2022-08-02 HISTORY — DX: Venous insufficiency (chronic) (peripheral): I87.2

## 2022-08-02 HISTORY — DX: Other forms of dyspnea: R06.09

## 2022-08-02 HISTORY — DX: Atherosclerosis of aorta: I70.0

## 2022-08-02 HISTORY — DX: Age-related osteoporosis without current pathological fracture: M81.0

## 2022-08-02 HISTORY — DX: Long term (current) use of unspecified immunomodulators and immunosuppressants: Z79.60

## 2022-08-02 HISTORY — DX: Angina pectoris, unspecified: I20.9

## 2022-08-02 LAB — GLUCOSE, CAPILLARY
Glucose-Capillary: 104 mg/dL — ABNORMAL HIGH (ref 70–99)
Glucose-Capillary: 110 mg/dL — ABNORMAL HIGH (ref 70–99)

## 2022-08-02 SURGERY — SHOULDER ARTHROSCOPY WITH SUBACROMIAL DECOMPRESSION AND DISTAL CLAVICLE EXCISION
Anesthesia: General | Laterality: Right

## 2022-08-02 MED ORDER — ASPIRIN 325 MG PO TBEC: 325.0000 mg | DELAYED_RELEASE_TABLET | Freq: Every day | ORAL | 0 refills | Status: AC

## 2022-08-02 MED ORDER — MIDAZOLAM HCL 2 MG/2ML IJ SOLN
1.0000 mg | INTRAMUSCULAR | Status: DC | PRN
  Administered 2022-08-02: 1 mg via INTRAVENOUS

## 2022-08-02 MED ORDER — FENTANYL CITRATE (PF) 100 MCG/2ML IJ SOLN
INTRAMUSCULAR | Status: DC | PRN
  Administered 2022-08-02 (×2): 50 ug via INTRAVENOUS

## 2022-08-02 MED ORDER — FENTANYL CITRATE (PF) 100 MCG/2ML IJ SOLN: 25.0000 ug | INTRAMUSCULAR | Status: DC | PRN

## 2022-08-02 MED ORDER — MIDAZOLAM HCL 2 MG/2ML IJ SOLN
INTRAMUSCULAR | Status: AC
  Filled 2022-08-02: qty 2

## 2022-08-02 MED ORDER — OXYCODONE HCL 5 MG PO TABS: 5.0000 mg | ORAL_TABLET | ORAL | 0 refills | Status: DC | PRN

## 2022-08-02 MED ORDER — ONDANSETRON HCL 4 MG/2ML IJ SOLN
INTRAMUSCULAR | Status: AC
  Filled 2022-08-02: qty 2

## 2022-08-02 MED ORDER — SUGAMMADEX SODIUM 200 MG/2ML IV SOLN
INTRAVENOUS | Status: DC | PRN
  Administered 2022-08-02: 250 mg via INTRAVENOUS

## 2022-08-02 MED ORDER — ONDANSETRON HCL 4 MG/2ML IJ SOLN
INTRAMUSCULAR | Status: DC | PRN
  Administered 2022-08-02: 4 mg via INTRAVENOUS

## 2022-08-02 MED ORDER — FENTANYL CITRATE PF 50 MCG/ML IJ SOSY
PREFILLED_SYRINGE | INTRAMUSCULAR | Status: AC
  Filled 2022-08-02: qty 1

## 2022-08-02 MED ORDER — CHLORHEXIDINE GLUCONATE 0.12 % MT SOLN
OROMUCOSAL | Status: AC
  Filled 2022-08-02: qty 15

## 2022-08-02 MED ORDER — LIDOCAINE HCL (PF) 2 % IJ SOLN
INTRAMUSCULAR | Status: AC
  Filled 2022-08-02: qty 5

## 2022-08-02 MED ORDER — ROCURONIUM BROMIDE 10 MG/ML (PF) SYRINGE
PREFILLED_SYRINGE | INTRAVENOUS | Status: AC
  Filled 2022-08-02: qty 10

## 2022-08-02 MED ORDER — LIDOCAINE HCL (PF) 1 % IJ SOLN
INTRAMUSCULAR | Status: DC | PRN
  Administered 2022-08-02: 5 mL via SUBCUTANEOUS

## 2022-08-02 MED ORDER — ACETAMINOPHEN 10 MG/ML IV SOLN
INTRAVENOUS | Status: AC
  Filled 2022-08-02: qty 100

## 2022-08-02 MED ORDER — DEXAMETHASONE SODIUM PHOSPHATE 10 MG/ML IJ SOLN
INTRAMUSCULAR | Status: AC
  Filled 2022-08-02: qty 1

## 2022-08-02 MED ORDER — PROPOFOL 10 MG/ML IV BOLUS
INTRAVENOUS | Status: AC
  Filled 2022-08-02: qty 20

## 2022-08-02 MED ORDER — ACETAMINOPHEN 10 MG/ML IV SOLN
INTRAVENOUS | Status: DC | PRN
  Administered 2022-08-02: 1000 mg via INTRAVENOUS

## 2022-08-02 MED ORDER — ACETAMINOPHEN 500 MG PO TABS: 1000.0000 mg | ORAL_TABLET | Freq: Three times a day (TID) | ORAL | 2 refills | Status: AC

## 2022-08-02 MED ORDER — BUPIVACAINE LIPOSOME 1.3 % IJ SUSP
INTRAMUSCULAR | Status: AC
  Filled 2022-08-02: qty 20

## 2022-08-02 MED ORDER — FENTANYL CITRATE PF 50 MCG/ML IJ SOSY
50.0000 ug | PREFILLED_SYRINGE | Freq: Once | INTRAMUSCULAR | Status: AC
  Administered 2022-08-02: 50 ug via INTRAVENOUS

## 2022-08-02 MED ORDER — LIDOCAINE HCL (CARDIAC) PF 100 MG/5ML IV SOSY
PREFILLED_SYRINGE | INTRAVENOUS | Status: DC | PRN
  Administered 2022-08-02: 80 mg via INTRAVENOUS

## 2022-08-02 MED ORDER — ONDANSETRON 4 MG PO TBDP: 4.0000 mg | ORAL_TABLET | Freq: Three times a day (TID) | ORAL | 0 refills | Status: DC | PRN

## 2022-08-02 MED ORDER — DROPERIDOL 2.5 MG/ML IJ SOLN: 0.6250 mg | Freq: Once | INTRAMUSCULAR | Status: DC | PRN

## 2022-08-02 MED ORDER — LACTATED RINGERS IR SOLN
Status: DC | PRN
  Administered 2022-08-02: 12004 mL

## 2022-08-02 MED ORDER — RINGERS IRRIGATION IR SOLN
Status: DC | PRN
  Administered 2022-08-02 (×5): 6000 mL

## 2022-08-02 MED ORDER — EPINEPHRINE PF 1 MG/ML IJ SOLN
INTRAMUSCULAR | Status: AC
  Filled 2022-08-02: qty 4

## 2022-08-02 MED ORDER — BUPIVACAINE LIPOSOME 1.3 % IJ SUSP
INTRAMUSCULAR | Status: DC | PRN
  Administered 2022-08-02: 20 mL via PERINEURAL

## 2022-08-02 MED ORDER — ROCURONIUM BROMIDE 100 MG/10ML IV SOLN
INTRAVENOUS | Status: DC | PRN
  Administered 2022-08-02: 50 mg via INTRAVENOUS

## 2022-08-02 MED ORDER — DEXAMETHASONE SODIUM PHOSPHATE 10 MG/ML IJ SOLN
INTRAMUSCULAR | Status: DC | PRN
  Administered 2022-08-02: 10 mg via INTRAVENOUS

## 2022-08-02 MED ORDER — PROPOFOL 10 MG/ML IV BOLUS
INTRAVENOUS | Status: DC | PRN
  Administered 2022-08-02: 140 mg via INTRAVENOUS
  Administered 2022-08-02: 60 mg via INTRAVENOUS

## 2022-08-02 MED ORDER — BUPIVACAINE HCL (PF) 0.5 % IJ SOLN
INTRAMUSCULAR | Status: DC | PRN
  Administered 2022-08-02: 10 mL via PERINEURAL

## 2022-08-02 MED ORDER — FENTANYL CITRATE (PF) 100 MCG/2ML IJ SOLN
INTRAMUSCULAR | Status: AC
  Filled 2022-08-02: qty 2

## 2022-08-02 MED ORDER — LIDOCAINE HCL (PF) 1 % IJ SOLN
INTRAMUSCULAR | Status: AC
  Filled 2022-08-02: qty 5

## 2022-08-02 MED ORDER — BUPIVACAINE HCL (PF) 0.5 % IJ SOLN
INTRAMUSCULAR | Status: AC
  Filled 2022-08-02: qty 10

## 2022-08-02 MED ORDER — SEVOFLURANE IN SOLN
RESPIRATORY_TRACT | Status: AC
  Filled 2022-08-02: qty 250

## 2022-08-02 SURGICAL SUPPLY — 90 items
ADAPTER IRRIG TUBE 2 SPIKE SOL (ADAPTER) ×2 IMPLANT
ADH SKN CLS APL DERMABOND .7 (GAUZE/BANDAGES/DRESSINGS)
ADPR TBG 2 SPK PMP STRL ASCP (ADAPTER) ×2
ANCH SUT 2 SWLK 19.1 CLS EYLT (Anchor) ×2 IMPLANT
ANCH SUT 2 SWLK 19.1 KNTLS (Orthopedic Implant) ×1 IMPLANT
ANCH SUT 2.9 PUSHLOCK ANCH (Orthopedic Implant) ×1 IMPLANT
ANCH SUT 2X2.3 TAPE (Anchor) ×2 IMPLANT
ANCH SUT 5 3.9 CRKSW KNTLS (Anchor) ×1 IMPLANT
ANCHOR 2.3 SP SGL 1.2 XBRAID (Anchor) IMPLANT
ANCHOR 3.9 PEEK CORKSCREW 5MTS (Anchor) IMPLANT
ANCHOR SUT KL SWIVELCK5.5X19.1 (Orthopedic Implant) IMPLANT
ANCHOR SWIVELOCK BIO 4.75X19.1 (Anchor) IMPLANT
ANCHR SUT KL SWIVELCK 5.5X19.1 (Orthopedic Implant) ×1 IMPLANT
APL PRP STRL LF DISP 70% ISPRP (MISCELLANEOUS) ×1
BLADE SHAVER 4.5X7 STR FR (MISCELLANEOUS) ×1 IMPLANT
BRUSH SCRUB EZ  4% CHG (MISCELLANEOUS) ×1
BRUSH SCRUB EZ 4% CHG (MISCELLANEOUS) ×1 IMPLANT
BUR BR 5.5 WIDE MOUTH (BURR) IMPLANT
CANNULA PART THRD DISP 5.75X7 (CANNULA) IMPLANT
CANNULA PARTIAL THREAD 2X7 (CANNULA) ×1 IMPLANT
CANNULA TWIST IN 8.25X9CM (CANNULA) IMPLANT
CHLORAPREP W/TINT 26 (MISCELLANEOUS) ×1 IMPLANT
COOLER POLAR GLACIER W/PUMP (MISCELLANEOUS) ×1 IMPLANT
DERMABOND ADVANCED .7 DNX12 (GAUZE/BANDAGES/DRESSINGS) IMPLANT
DEVICE SUCT BLK HOLE OR FLOOR (MISCELLANEOUS) ×2 IMPLANT
DRAPE 3/4 80X56 (DRAPES) ×1 IMPLANT
DRAPE INCISE IOBAN 66X45 STRL (DRAPES) ×1 IMPLANT
DRAPE STERI 35X30 U-POUCH (DRAPES) ×1 IMPLANT
DRAPE U-SHAPE 47X51 STRL (DRAPES) ×2 IMPLANT
DRSG TEGADERM 4X4.75 (GAUZE/BANDAGES/DRESSINGS) ×2 IMPLANT
ELECT REM PT RETURN 9FT ADLT (ELECTROSURGICAL) ×1
ELECTRODE REM PT RTRN 9FT ADLT (ELECTROSURGICAL) IMPLANT
GAUZE SPONGE 4X4 12PLY STRL (GAUZE/BANDAGES/DRESSINGS) ×1 IMPLANT
GAUZE XEROFORM 1X8 LF (GAUZE/BANDAGES/DRESSINGS) ×1 IMPLANT
GLOVE BIO SURGEON STRL SZ7.5 (GLOVE) ×1 IMPLANT
GLOVE BIOGEL PI IND STRL 8 (GLOVE) ×2 IMPLANT
GLOVE SURG ORTHO 8.0 STRL STRW (GLOVE) ×1 IMPLANT
GLOVE SURG SYN 7.5  E (GLOVE) ×1
GLOVE SURG SYN 7.5 E (GLOVE) ×1 IMPLANT
GLOVE SURG SYN 7.5 PF PI (GLOVE) ×1 IMPLANT
GOWN STRL REUS W/ TWL LRG LVL3 (GOWN DISPOSABLE) ×2 IMPLANT
GOWN STRL REUS W/ TWL XL LVL3 (GOWN DISPOSABLE) ×1 IMPLANT
GOWN STRL REUS W/TWL LRG LVL3 (GOWN DISPOSABLE) ×2
GOWN STRL REUS W/TWL LRG LVL4 (GOWN DISPOSABLE) ×1 IMPLANT
GOWN STRL REUS W/TWL XL LVL3 (GOWN DISPOSABLE) ×1
IV LACTATED RINGER IRRG 3000ML (IV SOLUTION) ×14
IV LR IRRIG 3000ML ARTHROMATIC (IV SOLUTION) ×4 IMPLANT
KIT CORKSCREW KNTLS 3.9 S/T/P (INSTRUMENTS) IMPLANT
KIT STABILIZATION SHOULDER (MISCELLANEOUS) ×1 IMPLANT
KIT SUTURETAK 3.0 INSERT PERC (KITS) ×1 IMPLANT
KIT TURNOVER KIT A (KITS) ×1 IMPLANT
MANIFOLD NEPTUNE II (INSTRUMENTS) ×2 IMPLANT
MASK FACE SPIDER DISP (MASK) ×1 IMPLANT
MAT ABSORB  FLUID 56X50 GRAY (MISCELLANEOUS) ×2
MAT ABSORB FLUID 56X50 GRAY (MISCELLANEOUS) ×2 IMPLANT
NDL HYPO 22X1.5 SAFETY MO (MISCELLANEOUS) ×1 IMPLANT
NDL SAFETY ECLIP 18X1.5 (MISCELLANEOUS) ×1 IMPLANT
NEEDLE HYPO 22X1.5 SAFETY MO (MISCELLANEOUS) ×1 IMPLANT
NS IRRIG 500ML POUR BTL (IV SOLUTION) ×1 IMPLANT
PACK ARTHROSCOPY SHOULDER (MISCELLANEOUS) ×1 IMPLANT
PAD ABD DERMACEA PRESS 5X9 (GAUZE/BANDAGES/DRESSINGS) ×1 IMPLANT
PAD ARMBOARD 7.5X6 YLW CONV (MISCELLANEOUS) ×1 IMPLANT
PAD WRAPON POLAR SHDR XLG (MISCELLANEOUS) ×1 IMPLANT
PASSER SUT FIRSTPASS SELF (INSTRUMENTS) ×1 IMPLANT
SHAVER BLADE BONE CUTTER  5.5 (BLADE)
SHAVER BLADE BONE CUTTER 5.5 (BLADE) IMPLANT
SPONGE T-LAP 18X18 ~~LOC~~+RFID (SPONGE) ×1 IMPLANT
STRAP SAFETY 5IN WIDE (MISCELLANEOUS) ×1 IMPLANT
SUT ETHILON 3-0 FS-10 30 BLK (SUTURE) ×1
SUT LASSO 90 DEG SD STR (SUTURE) ×1 IMPLANT
SUT MNCRL 4-0 (SUTURE) ×1
SUT MNCRL 4-0 27XMFL (SUTURE) ×1
SUT PDS AB 0 CT1 27 (SUTURE) IMPLANT
SUT VIC AB 0 CT1 36 (SUTURE) ×1 IMPLANT
SUT VIC AB 2-0 CT2 27 (SUTURE) ×1 IMPLANT
SUTURE EHLN 3-0 FS-10 30 BLK (SUTURE) ×1 IMPLANT
SUTURE MNCRL 4-0 27XMF (SUTURE) ×1 IMPLANT
SUTURE TAPE 1.3 40 TPR END (SUTURE) IMPLANT
SUTURETAPE 1.3 40 TPR END (SUTURE) ×1
SYR 10ML LL (SYRINGE) ×1 IMPLANT
SYSTEM IMPL TENODESIS LNT 2.9 (Orthopedic Implant) IMPLANT
TAPE CLOTH 3X10 WHT NS LF (GAUZE/BANDAGES/DRESSINGS) ×1 IMPLANT
TRAP FLUID SMOKE EVACUATOR (MISCELLANEOUS) ×1 IMPLANT
TUBE SET DOUBLEFLO INFLOW (TUBING) ×1 IMPLANT
TUBE SET DOUBLEFLO OUTFLOW (TUBING) ×1 IMPLANT
TUBING CONNECTING 10 (TUBING) ×1 IMPLANT
WAND WEREWOLF FLOW 90D (MISCELLANEOUS) ×1 IMPLANT
WATER STERILE IRR 500ML POUR (IV SOLUTION) ×1 IMPLANT
WRAP SHOULDER HOT/COLD PACK (SOFTGOODS) ×1 IMPLANT
WRAPON POLAR PAD SHDR XLG (MISCELLANEOUS) ×1

## 2022-08-02 NOTE — Transfer of Care (Signed)
Immediate Anesthesia Transfer of Care Note  Patient: Jacqueline Rhodes  Procedure(s) Performed: Right shoulder arthroscopic rotator cuff repair, subacromial decompression, and distal clavicle excision, Bicep tenodesis (Right)  Patient Location: PACU  Anesthesia Type:General  Level of Consciousness: awake and alert   Airway & Oxygen Therapy: Patient Spontanous Breathing and Patient connected to face mask oxygen  Post-op Assessment: Report given to RN and Post -op Vital signs reviewed and stable  Post vital signs: Reviewed and stable  Last Vitals:  Vitals Value Taken Time  BP 131/68 08/02/22 1515  Temp    Pulse 72 08/02/22 1516  Resp 15 08/02/22 1516  SpO2 100 % 08/02/22 1516  Vitals shown include unvalidated device data.  Last Pain:  Vitals:   08/02/22 0938  TempSrc: Oral  PainSc: 7          Complications: No notable events documented.

## 2022-08-02 NOTE — Discharge Instructions (Addendum)
Post-Op Instructions - Rotator Cuff Repair  1. Bracing: You will wear a shoulder immobilizer or sling for 6 weeks.   2. Driving: No driving for 3 weeks post-op. When driving, do not wear the immobilizer. Ideally, we recommend no driving for 6 weeks while sling is in place as one arm will be immobilized.   3. Activity: No active lifting for 2 months. Wrist, hand, and elbow motion only. Avoid lifting the upper arm away from the body except for hygiene. You are permitted to bend and straighten the elbow passively only (no active elbow motion). You may use your hand and wrist for typing, writing, and managing utensils (cutting food). Do not lift more than a coffee cup for 8 weeks.  When sleeping or resting, inclined positions (recliner chair or wedge pillow) and a pillow under the forearm for support may provide better comfort for up to 4 weeks.  Avoid long distance travel for 4 weeks.  Return to normal activities after rotator cuff repair repair normally takes 6 months on average. If rehab goes very well, may be able to do most activities at 4 months, except overhead or contact sports.  4. Physical Therapy: Begins 3-4 days after surgery, and proceed 1 time per week for the first 6 weeks, then 1-2 times per week from weeks 6-20 post-op.  5. Medications:  - You will be provided a prescription for narcotic pain medicine. After surgery, take 1-2 narcotic tablets every 4 hours if needed for severe pain.  - A prescription for anti-nausea medication will be provided in case the narcotic medicine causes nausea - take 1 tablet every 6 hours only if nauseated.   - Take tylenol 1000 mg (2 Extra Strength tablets or 3 regular strength) every 8 hours for pain.  May decrease or stop tylenol 5 days after surgery if you are having minimal pain. - Take ASA 325mg /day x 2 weeks to help prevent DVTs/PEs (blood clots).  - DO NOT take ANY nonsteroidal anti-inflammatory pain medications (Advil, Motrin, Ibuprofen, Aleve,  Naproxen, or Naprosyn). These medicines can inhibit healing of your shoulder repair.    If you are taking prescription medication for anxiety, depression, insomnia, muscle spasm, chronic pain, or for attention deficit disorder, you are advised that you are at a higher risk of adverse effects with use of narcotics post-op, including narcotic addiction/dependence, depressed breathing, death. If you use non-prescribed substances: alcohol, marijuana, cocaine, heroin, methamphetamines, etc., you are at a higher risk of adverse effects with use of narcotics post-op, including narcotic addiction/dependence, depressed breathing, death. You are advised that taking > 50 morphine milligram equivalents (MME) of narcotic pain medication per day results in twice the risk of overdose or death. For your prescription provided: oxycodone 5 mg - taking more than 6 tablets per day would result in > 50 morphine milligram equivalents (MME) of narcotic pain medication. Be advised that we will prescribe narcotics short-term, for acute post-operative pain only - 3 weeks for major operations such as shoulder repair/reconstruction surgeries.     6. Post-Op Appointment:  Your first post-op appointment will be 10-14 days post-op.  7. Work or School: For most, but not all procedures, we advise staying out of work or school for at least 1 to 2 weeks in order to recover from the stress of surgery and to allow time for healing.   If you need a work or school note this can be provided.   8. Smoking: If you are a smoker, you need to refrain from  smoking in the postoperative period. The nicotine in cigarettes will inhibit healing of your shoulder repair and decrease the chance of successful repair. Similarly, nicotine containing products (gum, patches) should be avoided.   Post-operative Brace: Apply and remove the brace you received as you were instructed to at the time of fitting and as described in detail as the brace's  instructions for use indicate.  Wear the brace for the period of time prescribed by your physician.  The brace can be cleaned with soap and water and allowed to air dry only.  Should the brace result in increased pain, decreased feeling (numbness/tingling), increased swelling or an overall worsening of your medical condition, please contact your doctor immediately.  If an emergency situation occurs as a result of wearing the brace after normal business hours, please dial 911 and seek immediate medical attention.  Let your doctor know if you have any further questions about the brace issued to you. Refer to the shoulder sling instructions for use if you have any questions regarding the correct fit of your shoulder sling.  [facility] Customer Care for Troubleshooting: [phone]  Video that illustrates how to properly use a shoulder sling: "Instructions for Proper Use of an Orthopaedic Sling" http://bass.com/    AMBULATORY SURGERY  DISCHARGE INSTRUCTIONS   The drugs that you were given will stay in your system until tomorrow so for the next 24 hours you should not:  Drive an automobile Make any legal decisions Drink any alcoholic beverage   You may resume regular meals tomorrow.  Today it is better to start with liquids and gradually work up to solid foods.  You may eat anything you prefer, but it is better to start with liquids, then soup and crackers, and gradually work up to solid foods.   Please notify your doctor immediately if you have any unusual bleeding, trouble breathing, redness and pain at the surgery site, drainage, fever, or pain not relieved by medication.     Additional Instructions:  leave green armand on for 4 days   POLAR CARE INFORMATION  MassAdvertisement.it  How to use [facility] Polar Care [facility] Therapy System?  YouTube   ShippingScam.co.uk  OPERATING INSTRUCTIONS  Start the product With dry hands, connect the  transformer to the electrical connection located on the top of the cooler. Next, plug the transformer into an appropriate electrical outlet. The unit will automatically start running at this point.  To stop the pump, disconnect electrical power.  Unplug to stop the product when not in use. Unplugging the Polar Care unit turns it off. Always unplug immediately after use. Never leave it plugged in while unattended. Remove pad.    FIRST ADD WATER TO FILL LINE, THEN ICE---Replace ice when existing ice is almost melted  1 Discuss Treatment with your Licensed Health Care Practitioner and Use Only as Prescribed 2 Apply Insulation Barrier & Cold Therapy Pad 3 Check for Moisture 4 Inspect Skin Regularly  Tips and Trouble Shooting Usage Tips 1. Use cubed or chunked ice for optimal performance. 2. It is recommended to drain the Pad between uses. To drain the pad, hold the Pad upright with the hose pointed toward the ground. Depress the black plunger and allow water to drain out. 3. You may disconnect the Pad from the unit without removing the pad from the affected area by depressing the silver tabs on the hose coupling and gently pulling the hoses apart. The Pad and unit will seal itself and will not leak. Note: Some  dripping during release is normal. 4. DO NOT RUN PUMP WITHOUT WATER! The pump in this unit is designed to run with water. Running the unit without water will cause permanent damage to the pump. 5. Unplug unit before removing lid.  TROUBLESHOOTING GUIDE Pump not running, Water not flowing to the pad, Pad is not getting cold 1. Make sure the transformer is plugged into the wall outlet. 2. Confirm that the ice and water are filled to the indicated levels. 3. Make sure there are no kinks in the pad. 4. Gently pull on the blue tube to make sure the tube/pad junction is straight. 5. Remove the pad from the treatment site and ll it while the pad is lying at; then reapply. 6. Confirm that the pad  couplings are securely attached to the unit. Listen for the double clicks (Figure 1) to confirm the pad couplings are securely attached.  Leaks    Note: Some condensation on the lines, controller, and pads is unavoidable, especially in warmer climates. 1. If using a [employer] Polar Care Cold Therapy unit with a detachable Cold Therapy Pad, and a leak exists (other than condensation on the lines) disconnect the pad couplings. Make sure the silver tabs on the couplings are depressed before reconnecting the pad to the pump hose; then confirm both sides of the coupling are properly clicked in. 2. If the coupling continues to leak or a leak is detected in the pad itself, stop using it and call [employer] Customer Care at [phone].  Cleaning After use, empty and dry the unit with a soft cloth. Warm water and mild detergent may be used occasionally to clean the pump and tubes.  WARNING: The Polar Care Cube can be cold enough to cause serious injury, including full skin necrosis. Follow these Operating Instructions, and carefully read the Product Insert (see pouch on side of unit) and the Cold Therapy Pad Fitting Instructions (provided with each Cold Therapy Pad) prior to use.

## 2022-08-02 NOTE — Anesthesia Procedure Notes (Signed)
Anesthesia Regional Block: Interscalene brachial plexus block   Pre-Anesthetic Checklist: , timeout performed,  Correct Patient, Correct Site, Correct Laterality,  Correct Procedure, Correct Position, site marked,  Risks and benefits discussed,  Surgical consent,  Pre-op evaluation,  At surgeon's request and post-op pain management  Laterality: Right and Upper  Prep: chloraprep       Needles:  Injection technique: Single-shot  Needle Type: Stimiplex     Needle Length: 5cm  Needle Gauge: 22     Additional Needles:   Procedures:,,,, ultrasound used (permanent image in chart),,    Narrative:  Start time: 08/02/2022 11:15 AM End time: 08/02/2022 11:18 AM Injection made incrementally with aspirations every 5 mL.  Performed by: Personally  Anesthesiologist: Lenard Simmer, MD  Additional Notes: Functioning IV was confirmed and monitors were applied.  A 50mm 22ga Stimuplex needle was used. Sterile prep and drape,hand hygiene and sterile gloves were used.  Negative aspiration and negative test dose prior to incremental administration of local anesthetic. The patient tolerated the procedure well.

## 2022-08-02 NOTE — Anesthesia Preprocedure Evaluation (Signed)
Anesthesia Evaluation  Patient identified by MRN, date of birth, ID band Patient awake    Reviewed: Allergy & Precautions, H&P , NPO status , Patient's Chart, lab work & pertinent test results, reviewed documented beta blocker date and time   History of Anesthesia Complications Negative for: history of anesthetic complications  Airway Mallampati: II  TM Distance: >3 FB Neck ROM: full    Dental  (+) Dental Advidsory Given, Edentulous Upper, Edentulous Lower, Upper Dentures   Pulmonary shortness of breath and with exertion, sleep apnea and Continuous Positive Airway Pressure Ventilation , COPD,  COPD inhaler, neg recent URI, former smoker          Cardiovascular Exercise Tolerance: Good hypertension, (-) angina + CAD, + Past MI, + Peripheral Vascular Disease and +CHF  (-) Cardiac Stents and (-) CABG (-) dysrhythmias (-) Valvular Problems/Murmurs     Neuro/Psych negative neurological ROS  negative psych ROS   GI/Hepatic Neg liver ROS,GERD  ,,  Endo/Other  diabetesHypothyroidism    Renal/GU Renal disease  negative genitourinary   Musculoskeletal   Abdominal   Peds  Hematology  (+) Blood dyscrasia, anemia   Anesthesia Other Findings Past Medical History: No date: Anemia No date: Arthritis No date: CHF (congestive heart failure) (HCC)     Comment:  2006 No date: Collagen vascular disease (HCC) No date: COPD (chronic obstructive pulmonary disease) (HCC) No date: Coronary artery disease No date: Diabetes mellitus without complication (HCC) No date: GERD (gastroesophageal reflux disease) No date: Hyperlipidemia No date: Hypertension No date: Knee pain, right No date: Lower extremity edema No date: Lymphedema No date: Peripheral vascular disease (HCC) No date: RA (rheumatoid arthritis) (HCC) No date: Sleep apnea No date: Thyroid cancer (HCC) No date: Thyroid cancer (HCC)   Reproductive/Obstetrics negative OB  ROS                             Anesthesia Physical Anesthesia Plan  ASA: 3  Anesthesia Plan: General   Post-op Pain Management: Regional block*   Induction: Intravenous  PONV Risk Score and Plan: 2 and Ondansetron, Dexamethasone and Treatment may vary due to age or medical condition  Airway Management Planned: Oral ETT  Additional Equipment:   Intra-op Plan:   Post-operative Plan: Extubation in OR  Informed Consent: I have reviewed the patients History and Physical, chart, labs and discussed the procedure including the risks, benefits and alternatives for the proposed anesthesia with the patient or authorized representative who has indicated his/her understanding and acceptance.     Dental Advisory Given  Plan Discussed with: Anesthesiologist, CRNA and Surgeon  Anesthesia Plan Comments:         Anesthesia Quick Evaluation

## 2022-08-02 NOTE — Anesthesia Procedure Notes (Signed)
Procedure Name: Intubation Date/Time: 08/02/2022 12:18 PM  Performed by: Hezzie Bump, CRNAPre-anesthesia Checklist: Patient identified, Patient being monitored, Timeout performed, Emergency Drugs available and Suction available Patient Re-evaluated:Patient Re-evaluated prior to induction Oxygen Delivery Method: Circle system utilized Preoxygenation: Pre-oxygenation with 100% oxygen Induction Type: IV induction Ventilation: Mask ventilation without difficulty Laryngoscope Size: Mac and 3 Grade View: Grade I Tube type: Oral Tube size: 7.0 mm Number of attempts: 1 Airway Equipment and Method: Stylet and Video-laryngoscopy Placement Confirmation: ETT inserted through vocal cords under direct vision, positive ETCO2 and breath sounds checked- equal and bilateral Secured at: 21 cm Tube secured with: Tape Dental Injury: Teeth and Oropharynx as per pre-operative assessment

## 2022-08-02 NOTE — Op Note (Signed)
SURGERY DATE: 08/02/2022   PRE-OP DIAGNOSIS:  1. Right subacromial impingement 2. Right biceps tendinopathy 3. Right rotator cuff tear 4. Right acromioclavicular joint arthritis  POST-OP DIAGNOSIS: 1. Right subacromial impingement 2. Right biceps tendinopathy 3. Right rotator cuff tear 4. Right acromioclavicular joint arthritis  PROCEDURES:  1. Right arthroscopic rotator cuff repair (upper border subscapularis, full-thickness supraspinatus entheses 2. Right arthroscopic biceps tenodesis 3. Right arthroscopic subacromial decompression 4. Right arthroscopic extensive debridement of shoulder (glenohumeral and subacromial spaces) 5. Right arthroscopic distal clavicle excision  SURGEON: Rosealee Albee, MD   ASSISTANT: Sonny Dandy, PA   ANESTHESIA: Gen with Exparel interscalene block   ESTIMATED BLOOD LOSS: 5cc   DRAINS:  none   TOTAL IV FLUIDS: per anesthesia      SPECIMENS: none   IMPLANTS:  - Arthrex 2.11mm PushLock x 1 - Arthrex 4.80mm SwiveLock x 1 - Arthrex 5.58mm SwiveLock x1 - Arthrex 3.36mm Knotless Corkscrew x1 - Iconix SPEED double loaded with 1.2 and 2.24mm tape x 2     OPERATIVE FINDINGS:  Examination under anesthesia: A careful examination under anesthesia was performed.  Passive range of motion was: FF: 140; ER at side: 45; ER in abduction: 90; IR in abduction: 45.  Anterior load shift: NT.  Posterior load shift: NT.  Sulcus in neutral: NT.  Sulcus in ER: NT.     Intra-operative findings: A thorough arthroscopic examination of the shoulder was performed.  The findings are: 1. Biceps tendon: Split thickness tearing 2. Superior labrum: erythema 3. Posterior labrum and capsule: normal 4. Inferior capsule and inferior recess: normal 5. Glenoid cartilage surface: Significant areas of grade 3 degenerative change diffusely, but mostly superiorly 6. Supraspinatus attachment: full-thickness tear of the supraspinatus 7. Posterior rotator cuff attachment: normal 8.  Humeral head articular cartilage: Extensive grade 3 degenerative change 9. Rotator interval: significant synovitis 10: Subscapularis tendon: Upper border partial-thickness tear 11. Anterior labrum: Mildly degenerative 12. IGHL: normal   OPERATIVE REPORT:    Indications for procedure:  EMMELYNN BEECHER is a 67 y.o. female with approximately 3 months of right shoulder pain after lifting arm overhead.  She has had difficulty with overhead motion since that time with sensations of weakness. Clinical exam and MRI were suggestive of rotator cuff tear, biceps tendinopathy, acromioclavicular joint arthritis and subacromial impingement. After discussion of risks, benefits, and alternatives to surgery, the patient elected to proceed.    Procedure in detail:   I identified YAILENE BASSI in the pre-operative holding area.  I marked the operative shoulder with my initials. I reviewed the risks and benefits of the proposed surgical intervention, and the patient wished to proceed.  Anesthesia was then performed with an Exparel interscalene block.  The patient was transferred to the operative suite and placed in the beach chair position.     Appropriate IV antibiotics were administered prior to incision. The operative upper extremity was then prepped and draped in standard fashion. A time out was performed confirming the correct extremity, correct patient, and correct procedure.    I then created a standard posterior portal with an 11 blade. The glenohumeral joint was easily entered with a blunt trocar and the arthroscope introduced. The findings of diagnostic arthroscopy are described above. I debrided degenerative tissue including the synovitic tissue about the rotator interval and anterior and superior labrum. I then coagulated the inflamed synovium to obtain hemostasis and reduce the risk of post-operative swelling using an Arthrocare radiofrequency device.  Next, I debrided the cartilaginous surfaces  of  both the glenoid and the humeral head such that there were stable cartilage edges.   I then turned my attention to the arthroscopic biceps tenodesis. The Loop n Tack technique was used to pass a FiberTape through the biceps in a locked fashion adjacent to the biceps anchor.  A hole for a 2.9 mm Arthrex PushLock was drilled in the bicipital groove just superior to the subscapularis tendon insertion.  The biceps tendon was then cut and the biceps anchor complex was debrided down to a stable base on the superior labrum.  The FiberTape was loaded onto the PushLock anchor and impacted into place into the previously drilled hole in the bicipital groove.  This appropriately secured the biceps into the bicipital groove and took it off of tension.   Next, arthroscopic repair of the subscapularis was performed. The lesser tuberosity footprint was prepared with a combination of electrocautery and an arthroscopic curette.  An Arthrex knotless corkscrew was placed into the lesser tuberosity footprint from the anterior portal.  A BirdBeak was used to shuttle the repair suture through the upper border of the subscapularis tendon.  The suture was then shuttled through the anchor. With the arm in neutral rotation, the repair was tensioned appropriately. This appropriately reduced the subscapularis tear.  The arm was then internally and externally rotated and the subscapularis was noted to move appropriately with rotation.  The remainder of the suture was then cut.   Next, the arthroscope was then introduced into the subacromial space. A direct lateral portal was created with an 11-blade after spinal needle localization. An extensive subacromial bursectomy and debridement was performed using a combination of the shaver and Arthrocare wand. The entire acromial undersurface was exposed and the CA ligament was subperiosteally elevated to expose the anterior acromial hook. A burr was used to create a flat anterior and lateral  aspect of the acromion, converting it from a Type 2 to a Type 1 acromion. Care was made to keep the deltoid fascia intact.   I then turned my attention to the arthroscopic distal clavicle excision. I identified the acromioclavicular joint. Surrounding bursal tissue was debrided and the edges of the joint were identified. I used the 5.46mm barrel burr to remove the distal clavicle parallel to the edge of the acromion. I was able to fit two widths of the burr into the space between the distal clavicle and acromion, signifying that I had removed ~31mm of distal clavicle. This was confirmed by viewing anteriorly and introducing a probe with measuring marks from the lateral portal. Hemostasis was achieved with an Arthrocare wand.   Next, I created an accessory posterolateral portal to assist with visualization and instrumentation.  I debrided the poor quality edges of the supraspinatus tendon.  This was a U-shaped tear of the supraspinatus.  I prepared the footprint using a burr to expose bleeding bone.     I then percutaneously placed 1 Iconix SPEED medial row anchor along the anterior portion of the tear at the articular margin. Another SPEED anchor was placed along the posterior portion of the tear at the articular margin. I then shuttled all 8 strands of tape through the rotator cuff just lateral to the musculotendinous junction using a FirstPass suture passer spanning the anterior to posterior extent of the tear. The posterior strands of each suture were passed through an [employer] anchor.  This was placed approximately 2 cm distal to the lateral edge of the footprint in line with the posterior aspect of  the tear with appropriate tensioning of each suture prior to final fixation.  A 5.5 mm SwiveLock anchor was utilized to achieve appropriate fixation of the posterior lateral row anchor.  Similarly, the anterior strands of each suture were passed through a 4.75 mm SwiveLock anchor along the anterior  margin of the tear.  There were 2 small central dogears.  The knotless mechanism of the SwiveLock anchors were utilized to reduce the dogears.  This construct allowed for excellent reapproximation of the rotator cuff to its native footprint without undue tension.  Appropriate compression was achieved.  The repair was stable to external and internal rotation.   Fluid was evacuated from the shoulder, and the portals were closed with 3-0 Nylon. Xeroform was applied to the portals. A sterile dressing was applied, followed by a Polar Care sleeve and a SlingShot shoulder immobilizer/sling. The patient was awakened from anesthesia without difficulty and was transferred to the PACU in stable condition.    Of note, assistance from a PA was essential to performing the surgery.  PA was present for the entire surgery.  PA assisted with patient positioning, retraction, instrumentation, and wound closure. The surgery would have been more difficult and had longer operative time without PA assistance.   COMPLICATIONS: none   DISPOSITION: plan for discharge home after recovery in PACU     POSTOPERATIVE PLAN: Remain in sling (except hygiene and elbow/wrist/hand RoM exercises as instructed by PT) x 6 weeks and NWB for this time. PT to begin approximately 1 week after surgery.  Large rotator cuff repair rehab protocol. ASA 325mg  daily x 2 weeks for DVT ppx.

## 2022-08-03 ENCOUNTER — Encounter: Payer: Self-pay | Admitting: Orthopedic Surgery

## 2022-08-04 NOTE — Anesthesia Postprocedure Evaluation (Signed)
Anesthesia Post Note  Patient: Jacqueline Rhodes  Procedure(s) Performed: Right shoulder arthroscopic rotator cuff repair, subacromial decompression, and distal clavicle excision, Bicep tenodesis (Right)  Patient location during evaluation: PACU Anesthesia Type: General Level of consciousness: awake and alert Pain management: pain level controlled Vital Signs Assessment: post-procedure vital signs reviewed and stable Respiratory status: spontaneous breathing, nonlabored ventilation, respiratory function stable and patient connected to nasal cannula oxygen Cardiovascular status: blood pressure returned to baseline and stable Postop Assessment: no apparent nausea or vomiting Anesthetic complications: no   No notable events documented.   Last Vitals:  Vitals:   08/02/22 1547 08/02/22 1609  BP: (!) 154/82 (!) 145/55  Pulse: 66 73  Resp: 19 17  Temp: (!) 36.3 C (!) 36.1 C  SpO2: 94% 96%    Last Pain:  Vitals:   08/03/22 1022  TempSrc:   PainSc: 0-No pain                 Lenard Simmer

## 2022-08-09 DIAGNOSIS — Z6841 Body Mass Index (BMI) 40.0 and over, adult: Secondary | ICD-10-CM | POA: Diagnosis not present

## 2022-08-09 DIAGNOSIS — Z87891 Personal history of nicotine dependence: Secondary | ICD-10-CM | POA: Diagnosis not present

## 2022-08-09 DIAGNOSIS — E119 Type 2 diabetes mellitus without complications: Secondary | ICD-10-CM | POA: Diagnosis not present

## 2022-08-09 DIAGNOSIS — L4050 Arthropathic psoriasis, unspecified: Secondary | ICD-10-CM | POA: Diagnosis not present

## 2022-08-09 DIAGNOSIS — M069 Rheumatoid arthritis, unspecified: Secondary | ICD-10-CM | POA: Diagnosis not present

## 2022-08-09 DIAGNOSIS — J4489 Other specified chronic obstructive pulmonary disease: Secondary | ICD-10-CM | POA: Diagnosis not present

## 2022-08-09 DIAGNOSIS — Z8701 Personal history of pneumonia (recurrent): Secondary | ICD-10-CM | POA: Diagnosis not present

## 2022-08-09 DIAGNOSIS — G4730 Sleep apnea, unspecified: Secondary | ICD-10-CM | POA: Diagnosis not present

## 2022-08-09 DIAGNOSIS — E785 Hyperlipidemia, unspecified: Secondary | ICD-10-CM | POA: Diagnosis not present

## 2022-08-09 DIAGNOSIS — K219 Gastro-esophageal reflux disease without esophagitis: Secondary | ICD-10-CM | POA: Diagnosis not present

## 2022-08-09 DIAGNOSIS — M792 Neuralgia and neuritis, unspecified: Secondary | ICD-10-CM | POA: Diagnosis not present

## 2022-08-09 DIAGNOSIS — I10 Essential (primary) hypertension: Secondary | ICD-10-CM | POA: Diagnosis not present

## 2022-08-09 DIAGNOSIS — I2510 Atherosclerotic heart disease of native coronary artery without angina pectoris: Secondary | ICD-10-CM | POA: Diagnosis not present

## 2022-08-09 DIAGNOSIS — E669 Obesity, unspecified: Secondary | ICD-10-CM | POA: Diagnosis not present

## 2022-08-09 DIAGNOSIS — M150 Primary generalized (osteo)arthritis: Secondary | ICD-10-CM | POA: Diagnosis not present

## 2022-08-09 DIAGNOSIS — K5790 Diverticulosis of intestine, part unspecified, without perforation or abscess without bleeding: Secondary | ICD-10-CM | POA: Diagnosis not present

## 2022-08-09 DIAGNOSIS — Z96611 Presence of right artificial shoulder joint: Secondary | ICD-10-CM | POA: Diagnosis not present

## 2022-08-09 DIAGNOSIS — Z7982 Long term (current) use of aspirin: Secondary | ICD-10-CM | POA: Diagnosis not present

## 2022-08-09 DIAGNOSIS — E039 Hypothyroidism, unspecified: Secondary | ICD-10-CM | POA: Diagnosis not present

## 2022-08-09 DIAGNOSIS — Z8616 Personal history of COVID-19: Secondary | ICD-10-CM | POA: Diagnosis not present

## 2022-08-09 DIAGNOSIS — I252 Old myocardial infarction: Secondary | ICD-10-CM | POA: Diagnosis not present

## 2022-08-09 DIAGNOSIS — Z471 Aftercare following joint replacement surgery: Secondary | ICD-10-CM | POA: Diagnosis not present

## 2022-09-23 DIAGNOSIS — Z9889 Other specified postprocedural states: Secondary | ICD-10-CM | POA: Diagnosis not present

## 2022-09-23 DIAGNOSIS — M25511 Pain in right shoulder: Secondary | ICD-10-CM | POA: Diagnosis not present

## 2022-09-27 DIAGNOSIS — Z9889 Other specified postprocedural states: Secondary | ICD-10-CM | POA: Diagnosis not present

## 2022-09-27 DIAGNOSIS — M25511 Pain in right shoulder: Secondary | ICD-10-CM | POA: Diagnosis not present

## 2022-09-28 ENCOUNTER — Other Ambulatory Visit: Payer: Self-pay

## 2022-09-28 ENCOUNTER — Encounter: Payer: Self-pay | Admitting: Emergency Medicine

## 2022-09-28 ENCOUNTER — Emergency Department
Admission: EM | Admit: 2022-09-28 | Discharge: 2022-09-28 | Disposition: A | Discharge status: Home / Self Care | Payer: PPO | Attending: Emergency Medicine | Admitting: Emergency Medicine

## 2022-09-28 DIAGNOSIS — R9431 Abnormal electrocardiogram [ECG] [EKG]: Secondary | ICD-10-CM | POA: Diagnosis not present

## 2022-09-28 DIAGNOSIS — R220 Localized swelling, mass and lump, head: Secondary | ICD-10-CM | POA: Diagnosis present

## 2022-09-28 DIAGNOSIS — Z79899 Other long term (current) drug therapy: Secondary | ICD-10-CM | POA: Insufficient documentation

## 2022-09-28 DIAGNOSIS — I10 Essential (primary) hypertension: Secondary | ICD-10-CM | POA: Insufficient documentation

## 2022-09-28 DIAGNOSIS — T783XXA Angioneurotic edema, initial encounter: Secondary | ICD-10-CM

## 2022-09-28 MED ORDER — SODIUM CHLORIDE 0.9 % IV SOLN: INTRAVENOUS | Status: DC

## 2022-09-28 MED ORDER — METHYLPREDNISOLONE SODIUM SUCC 125 MG IJ SOLR
125.0000 mg | Freq: Once | INTRAMUSCULAR | Status: AC
  Administered 2022-09-28: 125 mg via INTRAVENOUS
  Filled 2022-09-28: qty 2

## 2022-09-28 MED ORDER — PREDNISONE 50 MG PO TABS: 50.0000 mg | ORAL_TABLET | Freq: Every day | ORAL | 0 refills | Status: AC

## 2022-09-28 MED ORDER — DIPHENHYDRAMINE HCL 50 MG/ML IJ SOLN
50.0000 mg | Freq: Once | INTRAMUSCULAR | Status: AC
  Administered 2022-09-28: 50 mg via INTRAVENOUS
  Filled 2022-09-28: qty 1

## 2022-09-28 MED ORDER — TRANEXAMIC ACID FOR EPISTAXIS
500.0000 mg | Freq: Once | TOPICAL | Status: AC
  Administered 2022-09-28: 500 mg via TOPICAL
  Filled 2022-09-28: qty 10

## 2022-09-28 MED ORDER — C1 ESTERASE INHIBITOR (HUMAN) 500 UNITS IV KIT
2000.0000 [IU] | PACK | Freq: Once | INTRAVENOUS | Status: AC
  Administered 2022-09-28: 2000 [IU] via INTRAVENOUS
  Filled 2022-09-28: qty 2000

## 2022-09-28 NOTE — ED Notes (Addendum)
LEFT AC IV flushed with NS after med infusion and pt reflects no pain at site  EDP Bradler directs observation of pt for at least 2 hours post esterase infusion - pt notified  Pt with standby assist to toilet att

## 2022-09-28 NOTE — ED Notes (Signed)
Pt given ginger ale. Pt reports improvement in swallowing abilities

## 2022-09-28 NOTE — ED Triage Notes (Signed)
Patient to ED via POV for allergic reaction. Notable tongue swelling that started at 0830. States she took her normal meds this AM. No new meds for foods. Having trouble swallowing foods. Breathing WNL at this time.

## 2022-09-28 NOTE — Discharge Instructions (Signed)
Please STOP taking your losartan Please take your blood pressure daily until follow up with your PCP

## 2022-09-28 NOTE — ED Notes (Signed)
Pt reports continued improvement in symptoms

## 2022-09-28 NOTE — ED Provider Notes (Signed)
[facility] Provider Note   Event Date/Time   First MD Initiated Contact with Patient 09/28/22 1029     (approximate) History  Allergic Reaction  HPI Jacqueline Rhodes is a 67 y.o. female with stated past medical history of hypertension who presents complaining of tongue swelling that began this morning after taking her normal blood pressure medications including losartan.  Patient states that she is not on any ACE inhibitor medication and denies any medication changes recently.  Patient denies any food or drug allergies that she knows of or any exposures out of the ordinary this morning.  Patient denies slight difficulty swallowing but denies any difficulty breathing. ROS: Patient currently denies any vision changes, tinnitus, difficulty speaking, facial droop, sore throat, chest pain, shortness of breath, abdominal pain, nausea/vomiting/diarrhea, dysuria, or weakness/numbness/paresthesias in any extremity   Physical Exam  Triage Vital Signs: ED Triage Vitals [09/28/22 1032]  Encounter Vitals Group     BP      Systolic BP Percentile      Diastolic BP Percentile      Pulse      Resp      Temp      Temp src      SpO2      Weight 244 lb 11.4 oz (111 kg)     Height 5\' 4"  (1.626 m)     Head Circumference      Peak Flow      Pain Score 0     Pain Loc      Pain Education      Exclude from Growth Chart    Most recent vital signs: Vitals:   09/28/22 1430 09/28/22 1500  BP: 92/79 136/60  Pulse: 78 71  Resp: 18 17  Temp:    SpO2: 97% 98%   General: Awake, oriented x4. CV:  Good peripheral perfusion.  Resp:  Normal effort.  Abd:  No distention.  Other:  Morbidly obese elderly Caucasian female laying in bed in no acute distress.  Edema of the tongue without any surrounding mouth, uvular, or lip edema.  There is no stridor ED Results / Procedures / Treatments  Labs (all labs ordered are listed, but only abnormal results are displayed) Labs Reviewed - No  data to display PROCEDURES: Critical Care performed: No .1-3 Lead EKG Interpretation  Performed by: Merwyn Katos, MD Authorized by: Merwyn Katos, MD     Interpretation: normal     ECG rate:  71   ECG rate assessment: normal     Rhythm: sinus rhythm     Ectopy: none     Conduction: normal    MEDICATIONS ORDERED IN ED: Medications  methylPREDNISolone sodium succinate (SOLU-MEDROL) 125 mg/2 mL injection 125 mg (125 mg Intravenous Given 09/28/22 1040)  diphenhydrAMINE (BENADRYL) injection 50 mg (50 mg Intravenous Given 09/28/22 1041)  tranexamic acid (CYKLOKAPRON) 1000 MG/10ML topical solution 500 mg (500 mg Topical Given 09/28/22 1041)  C1 esterase inhibitor (Human) (BERINERT) injection 2,000 Units (2,000 Units Intravenous Given 09/28/22 1217)   IMPRESSION / MDM / ASSESSMENT AND PLAN / ED COURSE  I reviewed the triage vital signs and the nursing notes.                             The patient is on the cardiac monitor to evaluate for evidence of arrhythmia and/or significant heart rate changes. Patient's presentation is most consistent with acute presentation with potential  threat to life or bodily function. + Tongue swelling No evidence of multiorgan involvement Given the possibility of ARB/C1 esterase abnormalities, will treat empirically with C1 esterase inhibitor in addition to methylprednisolone and Benadryl. Given history and exam, presentation most consistent with allergic reaction. I have low suspicion for toxic shock syndrome, anaphylaxis, asthma exacerbation, or drug toxicity. Patient's symptoms improved over emergency department course and 4-hour observation as well as multihour observation after C1 esterase inhibitor with continued improvement.  Patient agrees with plan for discharge home with continued steroid dosage for the next 2 days and follow-up with her primary care physician for further management of her medications Rx: Prednisone 60mg  qday x3days, Benadryl 25mg   q8hr x3days Disposition: Discharge home with SRP. Follow up with PCP in 1-2 days.   FINAL CLINICAL IMPRESSION(S) / ED DIAGNOSES   Final diagnoses:  Angioedema, initial encounter   Rx / DC Orders   ED Discharge Orders          Ordered    predniSONE (DELTASONE) 50 MG tablet  Daily with breakfast        09/28/22 1512           Note:  This document was prepared using Dragon voice recognition software and may include unintentional dictation errors.   Merwyn Katos, MD 09/28/22 [phone]

## 2022-09-28 NOTE — ED Notes (Addendum)
Pt resting in bed at this time with family at bedside. Reports improved oral swelling. Still reports some mild difficulty with swallowing, but denies difficulty breathing. Pt A&Ox4. VSS at this time. Call bell within reach. Pt on monitor. Awaiting for C1 Esterase inhibitor to be sent from pharmacy.

## 2022-09-30 DIAGNOSIS — M25511 Pain in right shoulder: Secondary | ICD-10-CM | POA: Diagnosis not present

## 2022-09-30 DIAGNOSIS — Z9889 Other specified postprocedural states: Secondary | ICD-10-CM | POA: Diagnosis not present

## 2022-10-05 DIAGNOSIS — M25511 Pain in right shoulder: Secondary | ICD-10-CM | POA: Diagnosis not present

## 2022-10-05 DIAGNOSIS — Z9889 Other specified postprocedural states: Secondary | ICD-10-CM | POA: Diagnosis not present

## 2022-10-07 ENCOUNTER — Telehealth: Payer: Self-pay

## 2022-10-07 NOTE — Telephone Encounter (Signed)
Transition Care Management Follow-up Telephone Call Date of discharge and from where: [place] 7/17 How have you been since you were released from the hospital? Doing ok Any questions or concerns? No  Items Reviewed: Did the pt receive and understand the discharge instructions provided? Yes  Medications obtained and verified? Yes  Other? Yes  Any new allergies since your discharge? No  Dietary orders reviewed? No Do you have support at home? Yes     Follow up appointments reviewed:  PCP Hospital f/u appt confirmed? Yes  Scheduled to see PCP on 8/7 @ . Specialist Hospital f/u appt confirmed? No  Scheduled to see  on  @ . Are transportation arrangements needed? No  If their condition worsens, is the pt aware to call PCP or go to the Emergency Dept.? Yes Was the patient provided with contact information for the PCP's office or ED? Yes Was to pt encouraged to call back with questions or concerns? Yes

## 2022-10-10 DIAGNOSIS — M25511 Pain in right shoulder: Secondary | ICD-10-CM | POA: Diagnosis not present

## 2022-10-10 DIAGNOSIS — Z9889 Other specified postprocedural states: Secondary | ICD-10-CM | POA: Diagnosis not present

## 2022-10-12 DIAGNOSIS — Z79899 Other long term (current) drug therapy: Secondary | ICD-10-CM | POA: Diagnosis not present

## 2022-10-12 DIAGNOSIS — L4050 Arthropathic psoriasis, unspecified: Secondary | ICD-10-CM | POA: Diagnosis not present

## 2022-10-12 DIAGNOSIS — M159 Polyosteoarthritis, unspecified: Secondary | ICD-10-CM | POA: Diagnosis not present

## 2022-10-13 DIAGNOSIS — Z9889 Other specified postprocedural states: Secondary | ICD-10-CM | POA: Diagnosis not present

## 2022-10-13 DIAGNOSIS — M25511 Pain in right shoulder: Secondary | ICD-10-CM | POA: Diagnosis not present

## 2022-10-17 DIAGNOSIS — Z9889 Other specified postprocedural states: Secondary | ICD-10-CM | POA: Diagnosis not present

## 2022-10-17 DIAGNOSIS — M25511 Pain in right shoulder: Secondary | ICD-10-CM | POA: Diagnosis not present

## 2022-10-19 DIAGNOSIS — E538 Deficiency of other specified B group vitamins: Secondary | ICD-10-CM | POA: Diagnosis not present

## 2022-10-19 DIAGNOSIS — M25511 Pain in right shoulder: Secondary | ICD-10-CM | POA: Diagnosis not present

## 2022-10-19 DIAGNOSIS — E559 Vitamin D deficiency, unspecified: Secondary | ICD-10-CM | POA: Diagnosis not present

## 2022-10-19 DIAGNOSIS — Z9889 Other specified postprocedural states: Secondary | ICD-10-CM | POA: Diagnosis not present

## 2022-10-19 DIAGNOSIS — L4050 Arthropathic psoriasis, unspecified: Secondary | ICD-10-CM | POA: Diagnosis not present

## 2022-10-19 DIAGNOSIS — Z0000 Encounter for general adult medical examination without abnormal findings: Secondary | ICD-10-CM | POA: Diagnosis not present

## 2022-10-19 DIAGNOSIS — G4733 Obstructive sleep apnea (adult) (pediatric): Secondary | ICD-10-CM | POA: Diagnosis not present

## 2022-10-19 DIAGNOSIS — E039 Hypothyroidism, unspecified: Secondary | ICD-10-CM | POA: Diagnosis not present

## 2022-10-19 DIAGNOSIS — I10 Essential (primary) hypertension: Secondary | ICD-10-CM | POA: Diagnosis not present

## 2022-10-19 DIAGNOSIS — E782 Mixed hyperlipidemia: Secondary | ICD-10-CM | POA: Diagnosis not present

## 2022-10-19 DIAGNOSIS — E119 Type 2 diabetes mellitus without complications: Secondary | ICD-10-CM | POA: Diagnosis not present

## 2022-10-19 DIAGNOSIS — I25111 Atherosclerotic heart disease of native coronary artery with angina pectoris with documented spasm: Secondary | ICD-10-CM | POA: Diagnosis not present

## 2022-10-19 DIAGNOSIS — C73 Malignant neoplasm of thyroid gland: Secondary | ICD-10-CM | POA: Diagnosis not present

## 2022-10-19 DIAGNOSIS — M0579 Rheumatoid arthritis with rheumatoid factor of multiple sites without organ or systems involvement: Secondary | ICD-10-CM | POA: Diagnosis not present

## 2022-11-01 DIAGNOSIS — M25511 Pain in right shoulder: Secondary | ICD-10-CM | POA: Diagnosis not present

## 2022-11-01 DIAGNOSIS — Z9889 Other specified postprocedural states: Secondary | ICD-10-CM | POA: Diagnosis not present

## 2022-11-03 DIAGNOSIS — M25511 Pain in right shoulder: Secondary | ICD-10-CM | POA: Diagnosis not present

## 2022-11-03 DIAGNOSIS — Z9889 Other specified postprocedural states: Secondary | ICD-10-CM | POA: Diagnosis not present

## 2022-11-04 DIAGNOSIS — Z1211 Encounter for screening for malignant neoplasm of colon: Secondary | ICD-10-CM | POA: Diagnosis not present

## 2022-11-07 DIAGNOSIS — M75121 Complete rotator cuff tear or rupture of right shoulder, not specified as traumatic: Secondary | ICD-10-CM | POA: Diagnosis not present

## 2022-11-09 DIAGNOSIS — M25511 Pain in right shoulder: Secondary | ICD-10-CM | POA: Diagnosis not present

## 2022-11-09 DIAGNOSIS — Z9889 Other specified postprocedural states: Secondary | ICD-10-CM | POA: Diagnosis not present

## 2022-11-10 ENCOUNTER — Other Ambulatory Visit: Payer: Self-pay | Admitting: Orthopedic Surgery

## 2022-11-10 DIAGNOSIS — M75121 Complete rotator cuff tear or rupture of right shoulder, not specified as traumatic: Secondary | ICD-10-CM

## 2022-11-10 LAB — COLOGUARD
COLOGUARD: NEGATIVE
COLOGUARD: NEGATIVE

## 2022-11-10 LAB — EXTERNAL GENERIC LAB PROCEDURE: COLOGUARD: NEGATIVE

## 2022-11-11 ENCOUNTER — Observation Stay
Admission: EM | Admit: 2022-11-11 | Discharge: 2022-11-12 | Disposition: A | Discharge status: Home / Self Care | Payer: PPO | Attending: Internal Medicine | Admitting: Internal Medicine

## 2022-11-11 ENCOUNTER — Observation Stay: Payer: PPO

## 2022-11-11 ENCOUNTER — Other Ambulatory Visit: Payer: Self-pay

## 2022-11-11 DIAGNOSIS — M069 Rheumatoid arthritis, unspecified: Secondary | ICD-10-CM | POA: Insufficient documentation

## 2022-11-11 DIAGNOSIS — K219 Gastro-esophageal reflux disease without esophagitis: Secondary | ICD-10-CM | POA: Insufficient documentation

## 2022-11-11 DIAGNOSIS — Z79899 Other long term (current) drug therapy: Secondary | ICD-10-CM | POA: Diagnosis not present

## 2022-11-11 DIAGNOSIS — E785 Hyperlipidemia, unspecified: Secondary | ICD-10-CM | POA: Diagnosis not present

## 2022-11-11 DIAGNOSIS — M19011 Primary osteoarthritis, right shoulder: Secondary | ICD-10-CM | POA: Diagnosis not present

## 2022-11-11 DIAGNOSIS — Z96651 Presence of right artificial knee joint: Secondary | ICD-10-CM | POA: Insufficient documentation

## 2022-11-11 DIAGNOSIS — I2510 Atherosclerotic heart disease of native coronary artery without angina pectoris: Secondary | ICD-10-CM | POA: Diagnosis not present

## 2022-11-11 DIAGNOSIS — J45909 Unspecified asthma, uncomplicated: Secondary | ICD-10-CM | POA: Diagnosis not present

## 2022-11-11 DIAGNOSIS — T783XXA Angioneurotic edema, initial encounter: Secondary | ICD-10-CM | POA: Diagnosis not present

## 2022-11-11 DIAGNOSIS — Z87891 Personal history of nicotine dependence: Secondary | ICD-10-CM | POA: Insufficient documentation

## 2022-11-11 DIAGNOSIS — Z9889 Other specified postprocedural states: Secondary | ICD-10-CM | POA: Insufficient documentation

## 2022-11-11 DIAGNOSIS — I10 Essential (primary) hypertension: Secondary | ICD-10-CM | POA: Diagnosis present

## 2022-11-11 DIAGNOSIS — T783XXD Angioneurotic edema, subsequent encounter: Secondary | ICD-10-CM | POA: Diagnosis not present

## 2022-11-11 DIAGNOSIS — I110 Hypertensive heart disease with heart failure: Secondary | ICD-10-CM | POA: Insufficient documentation

## 2022-11-11 DIAGNOSIS — E119 Type 2 diabetes mellitus without complications: Secondary | ICD-10-CM

## 2022-11-11 DIAGNOSIS — E039 Hypothyroidism, unspecified: Secondary | ICD-10-CM | POA: Diagnosis not present

## 2022-11-11 DIAGNOSIS — M7581 Other shoulder lesions, right shoulder: Secondary | ICD-10-CM | POA: Diagnosis not present

## 2022-11-11 DIAGNOSIS — J449 Chronic obstructive pulmonary disease, unspecified: Secondary | ICD-10-CM | POA: Diagnosis not present

## 2022-11-11 DIAGNOSIS — M25411 Effusion, right shoulder: Secondary | ICD-10-CM | POA: Diagnosis not present

## 2022-11-11 DIAGNOSIS — I509 Heart failure, unspecified: Secondary | ICD-10-CM | POA: Insufficient documentation

## 2022-11-11 DIAGNOSIS — M75121 Complete rotator cuff tear or rupture of right shoulder, not specified as traumatic: Secondary | ICD-10-CM | POA: Diagnosis not present

## 2022-11-11 LAB — BASIC METABOLIC PANEL
Anion gap: 15 (ref 5–15)
BUN: 22 mg/dL (ref 8–23)
CO2: 26 mmol/L (ref 22–32)
Calcium: 9.8 mg/dL (ref 8.9–10.3)
Chloride: 101 mmol/L (ref 98–111)
Creatinine, Ser: 0.89 mg/dL (ref 0.44–1.00)
GFR, Estimated: >60 mL/min (ref >60)
Glucose, Bld: 143 mg/dL — ABNORMAL HIGH (ref 70–99)
Potassium: 4.2 mmol/L (ref 3.5–5.1)
Sodium: 142 mmol/L (ref 135–145)

## 2022-11-11 LAB — GLUCOSE, CAPILLARY
Glucose-Capillary: 237 mg/dL — ABNORMAL HIGH (ref 70–99)
Glucose-Capillary: 185 mg/dL — ABNORMAL HIGH (ref 70–99)
Glucose-Capillary: 164 mg/dL — ABNORMAL HIGH (ref 70–99)
Glucose-Capillary: 171 mg/dL — ABNORMAL HIGH (ref 70–99)
Glucose-Capillary: 136 mg/dL — ABNORMAL HIGH (ref 70–99)

## 2022-11-11 LAB — CBC WITH DIFFERENTIAL/PLATELET
Abs Immature Granulocytes: 0.03 10*3/uL (ref 0.00–0.07)
Basophils Absolute: 0.1 10*3/uL (ref 0.0–0.1)
Basophils Relative: 1 %
Eosinophils Absolute: 1.2 10*3/uL — ABNORMAL HIGH (ref 0.0–0.5)
Eosinophils Relative: 16 %
HCT: 43.9 % (ref 36.0–46.0)
Hemoglobin: 14.3 g/dL (ref 12.0–15.0)
Immature Granulocytes: 0 %
Lymphocytes Relative: 28 %
Lymphs Abs: 2.1 10*3/uL (ref 0.7–4.0)
MCH: 29.6 pg (ref 26.0–34.0)
MCHC: 32.6 g/dL (ref 30.0–36.0)
MCV: 90.9 fL (ref 80.0–100.0)
Monocytes Absolute: 0.5 10*3/uL (ref 0.1–1.0)
Monocytes Relative: 6 %
Neutro Abs: 3.7 10*3/uL (ref 1.7–7.7)
Neutrophils Relative %: 49 %
Platelets: 251 10*3/uL (ref 150–400)
RBC: 4.83 MIL/uL (ref 3.87–5.11)
RDW: 14.5 % (ref 11.5–15.5)
WBC: 7.5 10*3/uL (ref 4.0–10.5)
nRBC: 0 % (ref 0.0–0.2)

## 2022-11-11 LAB — HIV ANTIBODY (ROUTINE TESTING W REFLEX): HIV Screen 4th Generation wRfx: NONREACTIVE

## 2022-11-11 LAB — HEMOGLOBIN A1C
Hgb A1c MFr Bld: 6.3 % — ABNORMAL HIGH (ref 4.8–5.6)
Mean Plasma Glucose: 134.11 mg/dL

## 2022-11-11 MED ORDER — LEVOTHYROXINE SODIUM 50 MCG PO TABS
150.0000 ug | ORAL_TABLET | Freq: Every day | ORAL | Status: DC
  Administered 2022-11-12: 150 ug via ORAL
  Filled 2022-11-11: qty 3

## 2022-11-11 MED ORDER — FOLIC ACID 1 MG PO TABS
1.0000 mg | ORAL_TABLET | ORAL | Status: DC
  Administered 2022-11-12: 1 mg via ORAL
  Filled 2022-11-11: qty 1

## 2022-11-11 MED ORDER — HYDROCHLOROTHIAZIDE 25 MG PO TABS: 25.0000 mg | ORAL_TABLET | Freq: Every day | ORAL | Status: DC

## 2022-11-11 MED ORDER — FAMOTIDINE IN NACL 20-0.9 MG/50ML-% IV SOLN
20.0000 mg | Freq: Once | INTRAVENOUS | Status: AC
  Administered 2022-11-11: 20 mg via INTRAVENOUS
  Filled 2022-11-11: qty 50

## 2022-11-11 MED ORDER — DIPHENHYDRAMINE HCL 50 MG/ML IJ SOLN
25.0000 mg | Freq: Four times a day (QID) | INTRAMUSCULAR | Status: AC
  Administered 2022-11-11 (×3): 25 mg via INTRAVENOUS
  Filled 2022-11-11 (×3): qty 1

## 2022-11-11 MED ORDER — ISOSORBIDE MONONITRATE ER 60 MG PO TB24
60.0000 mg | ORAL_TABLET | Freq: Every day | ORAL | Status: DC
  Administered 2022-11-12: 60 mg via ORAL
  Filled 2022-11-11 (×2): qty 1

## 2022-11-11 MED ORDER — INSULIN ASPART 100 UNIT/ML IJ SOLN
0.0000 [IU] | INTRAMUSCULAR | Status: DC
  Administered 2022-11-11: 3 [IU] via SUBCUTANEOUS
  Administered 2022-11-11: 2 [IU] via SUBCUTANEOUS
  Administered 2022-11-11: 3 [IU] via SUBCUTANEOUS
  Administered 2022-11-11: 5 [IU] via SUBCUTANEOUS
  Administered 2022-11-12: 2 [IU] via SUBCUTANEOUS
  Administered 2022-11-12: 3 [IU] via SUBCUTANEOUS
  Filled 2022-11-11 (×6): qty 1

## 2022-11-11 MED ORDER — ACETAMINOPHEN 325 MG PO TABS: 650.0000 mg | ORAL_TABLET | Freq: Three times a day (TID) | ORAL | Status: DC | PRN

## 2022-11-11 MED ORDER — ALBUTEROL SULFATE (2.5 MG/3ML) 0.083% IN NEBU: 3.0000 mL | INHALATION_SOLUTION | Freq: Four times a day (QID) | RESPIRATORY_TRACT | Status: DC | PRN

## 2022-11-11 MED ORDER — METHYLPREDNISOLONE SODIUM SUCC 125 MG IJ SOLR
125.0000 mg | Freq: Once | INTRAMUSCULAR | Status: AC
  Administered 2022-11-11: 125 mg via INTRAVENOUS
  Filled 2022-11-11: qty 2

## 2022-11-11 MED ORDER — FLUTICASONE FUROATE-VILANTEROL 100-25 MCG/ACT IN AEPB
1.0000 | INHALATION_SPRAY | Freq: Every day | RESPIRATORY_TRACT | Status: DC
  Administered 2022-11-11 – 2022-11-12 (×2): 1 via RESPIRATORY_TRACT
  Filled 2022-11-11: qty 28

## 2022-11-11 MED ORDER — C1 ESTERASE INHIBITOR (HUMAN) 500 UNITS IV KIT
20.0000 [IU]/kg | PACK | Freq: Once | INTRAVENOUS | Status: AC
  Administered 2022-11-11: 2000 [IU] via INTRAVENOUS
  Filled 2022-11-11: qty 2000

## 2022-11-11 MED ORDER — FAMOTIDINE IN NACL 20-0.9 MG/50ML-% IV SOLN
20.0000 mg | Freq: Two times a day (BID) | INTRAVENOUS | Status: DC
  Administered 2022-11-11 – 2022-11-12 (×3): 20 mg via INTRAVENOUS
  Filled 2022-11-11 (×3): qty 50

## 2022-11-11 MED ORDER — DIPHENHYDRAMINE HCL 50 MG/ML IJ SOLN
25.0000 mg | Freq: Once | INTRAMUSCULAR | Status: AC
  Administered 2022-11-11: 25 mg via INTRAVENOUS
  Filled 2022-11-11: qty 1

## 2022-11-11 MED ORDER — ENOXAPARIN SODIUM 60 MG/0.6ML IJ SOSY
0.5000 mg/kg | PREFILLED_SYRINGE | INTRAMUSCULAR | Status: DC
  Administered 2022-11-11: 55 mg via SUBCUTANEOUS
  Filled 2022-11-11 (×2): qty 0.6

## 2022-11-11 MED ORDER — PANTOPRAZOLE SODIUM 40 MG PO TBEC
40.0000 mg | DELAYED_RELEASE_TABLET | Freq: Every day | ORAL | Status: DC
  Administered 2022-11-12: 40 mg via ORAL
  Filled 2022-11-11: qty 1

## 2022-11-11 MED ORDER — SODIUM CHLORIDE 0.9 % IV SOLN: INTRAVENOUS | Status: DC

## 2022-11-11 MED ORDER — TRANEXAMIC ACID-NACL 1000-0.7 MG/100ML-% IV SOLN
1000.0000 mg | Freq: Once | INTRAVENOUS | Status: AC
  Administered 2022-11-11: 1000 mg via INTRAVENOUS
  Filled 2022-11-11: qty 100

## 2022-11-11 NOTE — Progress Notes (Signed)
PHARMACIST - PHYSICIAN COMMUNICATION  CONCERNING:  Enoxaparin (Lovenox) for DVT Prophylaxis    RECOMMENDATION: Patient was prescribed enoxaprin 40mg  q24 hours for VTE prophylaxis.   Filed Weights   11/11/22 0502  Weight: 111.1 kg (245 lb)    Body mass index is 42.05 kg/m.  Estimated Creatinine Clearance: 72.8 mL/min (by C-G formula based on SCr of 0.89 mg/dL).   Based on [facility] policy patient is candidate for enoxaparin 0.5mg /kg TBW SQ every 24 hours based on BMI being >30.  DESCRIPTION: Pharmacy has adjusted enoxaparin dose per [facility] policy.  Patient is now receiving enoxaparin 55 mg every 24 hours    Rockwell Alexandria, PharmD Clinical Pharmacist  11/11/2022 9:48 AM

## 2022-11-11 NOTE — Plan of Care (Signed)
  Problem: Education: Goal: Knowledge of General Education information will improve Description Including pain rating scale, medication(s)/side effects and non-pharmacologic comfort measures Outcome: Progressing   Problem: Health Behavior/Discharge Planning: Goal: Ability to manage health-related needs will improve Outcome: Progressing   

## 2022-11-11 NOTE — Progress Notes (Signed)
MD approved the patient to have cream in her coffee while on a clear liquid diet

## 2022-11-11 NOTE — Assessment & Plan Note (Addendum)
Positive tongue swelling overnight consistent with angioedema with noted recent episode of ARB associated angioedema within the past 6 weeks No significant airway involvement at present Patient denies any repeat ARB use Status post TXA, C1 inhibitor, IV Solu-Medrol, IV Pepcid and Benadryl Will check complement level Continue IV Pepcid and Benadryl Repeat Solu-Medrol dose this evening Monitor closely Consider outpatient allergist follow up after discharge

## 2022-11-11 NOTE — Assessment & Plan Note (Signed)
Stable from a cardiac standpoint Continue home regimen

## 2022-11-11 NOTE — Assessment & Plan Note (Signed)
Continue Synthroid °

## 2022-11-11 NOTE — Assessment & Plan Note (Signed)
SSI A1c Monitor blood sugar with steroid use

## 2022-11-11 NOTE — Assessment & Plan Note (Signed)
Stable from respiratory standpoint Continue home inhalers 

## 2022-11-11 NOTE — ED Provider Notes (Signed)
[facility] Provider Note    Event Date/Time   First MD Initiated Contact with Patient 11/11/22 [phone]     (approximate)   History   Angioedema   HPI  Jacqueline Rhodes is a 67 y.o. female with history of COPD, CAD, hypertension, hyperlipidemia, diabetes who presents to the emergency department with angioedema.  Went to bed last night in her normal state of health and woke up just prior to arrival with tongue swelling.  Feels like there is some swelling into her throat as well.  States she is having hard time swallowing but no shortness of breath.  She denies any new exposures.  She had this happen to her recently and was taken off of losartan.  She is not on an ACE inhibitor.  No family history of the same.  It appears last time she was in the ED she did receive C1 esterase inhibitor which helped her symptoms significantly.  She was able to be discharged from the emergency department and did not require admission.   History provided by patient.    Past Medical History:  Diagnosis Date   Anemia    Anginal pain (HCC)    Aortic atherosclerosis (HCC)    Asthma    B12 deficiency    Carpal tunnel syndrome    CHF (congestive heart failure) (HCC)    Chronic venous insufficiency    Complete tear of right rotator cuff    COPD (chronic obstructive pulmonary disease) (HCC)    Coronary artery disease 01/07/2005   a.) LHC 01/07/2005 (NSTEMI): LHC 60% pLAD, 70% D1, 30% pLCx, 20% pRCA, 25% dRCA - med mgmt; b.) LHC ([place]) 01/10/2008: 25/60% pLAD, 70% D1, 20% pLCx, 20% mRCA --> FFR normal --> med mgmt; c.) LHC ([place]) 01/04/2012:: 40/60% pLAD, 70% D1, 25% pLCx, 20% pRCA, 20% mRCA, 25% dRCA --> FFR normal --> med mgmt; d.) LHC ([place]) 01/10/2018: 20% pRCA, 50% pLAD, 55% oD1 -- med mgmt   Diverticulosis    DOE (dyspnea on exertion)    GERD (gastroesophageal reflux disease)    History of bilateral cataract extraction 2014   Hyperlipidemia    Hypertension    Hypothyroidism     a.) s/p LEFT hemithyroidectomy; on levothyroxine   Long term current use of aspirin    Long term current use of immunosuppressive drug    a.) MTX + secukinumab for RA/psoriatric arthritis diagnoses   Lower extremity edema    Lymphedema    NSTEMI (non-ST elevated myocardial infarction) (HCC) 01/06/2005   a.) LHC 01/07/2005: 60% pLAD, 70% D1, 30% pLCx, 20% pRCA, 25% dRCA -- med mgmt   OSA on CPAP    a.) compliance issues with prescribed nocturnal PAP therapy   Osteoarthritis    Peripheral vascular disease (HCC)    Post-menopausal osteoporosis    Psoriatic arthritis (HCC)    RA (rheumatoid arthritis) (HCC)    Renal insufficiency    T2DM (type 2 diabetes mellitus) (HCC)    Thyroid cancer (HCC)    a.) s/p LEFT hemithyroidectomy 08/2015    Past Surgical History:  Procedure Laterality Date   ABDOMINAL HYSTERECTOMY  2003   BIOPSY THYROID  2017   x 2   BREAST BIOPSY Left 2000   benign   BREAST BIOPSY Right 2015   benign   CATARACT EXTRACTION, BILATERAL  2014   left ankle surgery  2001   LEFT HEART CATH AND CORONARY ANGIOGRAPHY Left 01/10/2018   Procedure: LEFT HEART CATH AND CORONARY ANGIOGRAPHY;  Surgeon: Lamar Blinks, MD;  Location: [facility] INVASIVE CV LAB;  Service: Cardiovascular;  Laterality: Left;   LEFT HEART CATH AND CORONARY ANGIOGRAPHY Left 01/07/2005   Procedure: LEFT HEART CATH AND CORONARY ANGIOGRAPHY; Location: [facility]; Surgeon: Arnoldo Hooker, MD   LEFT HEART CATH AND CORONARY ANGIOGRAPHY Left 01/10/2008   Procedure: LEFT HEART CATH AND CORONARY ANGIOGRAPHY; Location: [facility]; Surgeon: Arnoldo Hooker, MD / Rudean Hitt, MD   LEFT HEART CATH AND CORONARY ANGIOGRAPHY Left 01/04/2012   Procedure: LEFT HEART CATH AND CORONARY ANGIOGRAPHY; Location: [facility]; Surgeon: Arnoldo Hooker, MD / Marcina Millard, MD   left shoulder surgery  1996   right elbow surgery  1999   THYROIDECTOMY, PARTIAL Left 09/05/2015   Procedure: LEFT HEMITHYROIDECTOMY   TOTAL KNEE ARTHROPLASTY Right  05/29/2018   Procedure: TOTAL KNEE ARTHROPLASTY-RIGHT;  Surgeon: Kennedy Bucker, MD;  Location: [facility] ORS;  Service: Orthopedics;  Laterality: Right;   VEIN SURGERY Bilateral 2015   legs    MEDICATIONS:  Prior to Admission medications   Medication Sig Start Date End Date Taking? Authorizing Provider  acetaminophen (TYLENOL) 500 MG tablet Take 2 tablets (1,000 mg total) by mouth every 8 (eight) hours. 08/02/22 08/02/23  Signa Kell, MD  albuterol (PROVENTIL HFA;VENTOLIN HFA) 108 (90 Base) MCG/ACT inhaler Inhale 2 puffs into the lungs every 6 (six) hours as needed for wheezing or shortness of breath.     [provider]  Calcium Fructoborate POWD 20 mcg by Does not apply route. Cream    [provider]  Cetirizine HCl 10 MG CAPS Take 10 mg by mouth daily.     [provider]  Cholecalciferol 25 MCG (1000 UT) TBDP Take 125 mcg by mouth.    [provider]  Coenzyme Q10 (COQ-10) 100 MG CAPS Take 100 mg by mouth daily.     [provider]  Continuous Glucose Sensor (FREESTYLE LIBRE 2 SENSOR) MISC by Does not apply route.    [provider]  cyanocobalamin (,VITAMIN B-12,) 1000 MCG/ML injection Inject 1,000 mcg into the muscle every 30 (thirty) days.  Patient not taking: Reported on 08/02/2022 12/18/15   [provider]  dexlansoprazole (DEXILANT) 60 MG capsule Take 60 mg by mouth daily.  Patient not taking: Reported on 07/26/2022 12/18/15   [provider]  fluticasone furoate-vilanterol (BREO ELLIPTA) 100-25 MCG/INH AEPB Inhale 1 puff into the lungs daily.  01/27/17   [provider]  folic acid (FOLVITE) 1 MG tablet Take 1 mg by mouth every other day.  11/10/16   [provider]  furosemide (LASIX) 20 MG tablet Take 20 mg by mouth.    [provider]  glucose blood (ACCU-CHEK COMPACT PLUS) test strip Use 4 (four) times daily. Use as instructed. 08/11/16   [provider]  hydrochlorothiazide  (HYDRODIURIL) 25 MG tablet Take 25 mg by mouth daily.  08/23/16   [provider]  isosorbide mononitrate (IMDUR) 60 MG 24 hr tablet Take 60 mg by mouth daily.  08/23/16   [provider]  levothyroxine (SYNTHROID) 150 MCG tablet Take 150 mcg by mouth daily before breakfast.    [provider]  levothyroxine (SYNTHROID, LEVOTHROID) 100 MCG tablet Take 100 mcg by mouth daily before breakfast. 09/06/17   [provider]  losartan (COZAAR) 100 MG tablet Take 100 mg by mouth daily.  08/23/16   [provider]  meclizine (ANTIVERT) 25 MG tablet Take 25 mg by mouth 3 (three) times daily as needed for dizziness.    [provider]  metaxalone (SKELAXIN) 800 MG tablet Take 800 mg by mouth 3 (three) times daily.    [provider]  methotrexate (RHEUMATREX) 2.5 MG tablet Take 20 mg by mouth every Thursday.  09/06/16   [provider]  Methotrexate, Anti-Rheumatic, (RHEUMATREX PO) Take 20 mg by mouth every 7 (seven) days.    [provider]  Multiple Vitamin (VITAMIN E/FOLIC ACID/B-6/B-12 PO) Take by mouth.    [provider]  mupirocin ointment (BACTROBAN) 2 % 1 Application 2 (two) times daily.    [provider]  nortriptyline (PAMELOR) 10 MG capsule Take 10 mg by mouth at bedtime. Patient not taking: Reported on 07/26/2022    [provider]  ondansetron (ZOFRAN-ODT) 4 MG disintegrating tablet Take 1 tablet (4 mg total) by mouth every 8 (eight) hours as needed for nausea or vomiting. 08/02/22   Signa Kell, MD  oxyCODONE (ROXICODONE) 5 MG immediate release tablet Take 1-2 tablets (5-10 mg total) by mouth every 4 (four) hours as needed (pain). 08/02/22 08/02/23  Signa Kell, MD  pantoprazole (PROTONIX) 40 MG tablet Take 40 mg by mouth daily.    [provider]  pravastatin (PRAVACHOL) 20 MG tablet Take 20 mg by mouth daily.    [provider]  Secukinumab (COSENTYX SENSOREADY 300 DOSE) 150  MG/ML SOAJ Inject 300 mg into the skin every 28 (twenty-eight) days.  07/07/17   [provider]  simvastatin (ZOCOR) 40 MG tablet Take 20 mg by mouth at bedtime.  Patient not taking: Reported on 07/26/2022 11/10/16   [provider]  Thiamine Mononitrate POWD 2,400 mcg by Does not apply route. Patient not taking: Reported on 08/02/2022    [provider]  triamcinolone ointment (KENALOG) 0.1 % Apply 1 application topically 2 (two) times daily as needed (psoriasis). Patient not taking: Reported on 07/26/2022    [provider]    Physical Exam   Triage Vital Signs: ED Triage Vitals [11/11/22 0502]  Encounter Vitals Group     BP (!) 180/82     Systolic BP Percentile      Diastolic BP Percentile      Pulse Rate 85     Resp 18     Temp 98.4 F (36.9 C)     Temp Source Oral     SpO2 96 %     Weight 245 lb (111.1 kg)     Height 5\' 4"  (1.626 m)     Head Circumference      Peak Flow      Pain Score 3     Pain Loc      Pain Education      Exclude from Growth Chart     Most recent vital signs: Vitals:   11/11/22 0502 11/11/22 0510  BP: (!) 180/82 (!) 144/82  Pulse: 85 79  Resp: 18 14  Temp: 98.4 F (36.9 C)   SpO2: 96% 98%    CONSTITUTIONAL: Alert, responds appropriately to questions. Well-appearing; well-nourished HEAD: Normocephalic, atraumatic EYES: Conjunctivae clear, pupils appear equal, sclera nonicteric ENT: normal nose; moist mucous membranes, slightly muffled phonation secondary to tongue swelling, significant swelling noted to the anterior part of the tongue but posterior tongue appears normal, airway is patent, she is managing her own secretions, there is no trismus or drooling, no stridor NECK: Supple, normal ROM CARD: RRR; S1 and S2 appreciated RESP: Normal chest excursion without splinting or tachypnea; breath sounds clear and equal bilaterally; no wheezes, no rhonchi, no rales, no  hypoxia or respiratory distress, speaking full  sentences ABD/GI: Non-distended; soft, non-tender, no rebound, no guarding, no peritoneal signs BACK: The back appears normal EXT: Normal ROM in all joints; no deformity noted, no edema SKIN: Normal color for age and race; warm; no rash on exposed skin, no urticaria NEURO: Moves all extremities equally, normal speech PSYCH: The patient's mood and manner are appropriate.   ED Results / Procedures / Treatments   LABS: (all labs ordered are listed, but only abnormal results are displayed) Labs Reviewed  CBC WITH DIFFERENTIAL/PLATELET - Abnormal; Notable for the following components:      Result Value   Eosinophils Absolute 1.2 (*)    All other components within normal limits  BASIC METABOLIC PANEL - Abnormal; Notable for the following components:   Glucose, Bld 143 (*)    All other components within normal limits     EKG:  EKG Interpretation Date/Time:  Friday November 11 2022 05:12:43 EDT Ventricular Rate:  70 PR Interval:  180 QRS Duration:  116 QT Interval:  389 QTC Calculation: 420 R Axis:   53  Text Interpretation: Sinus rhythm Nonspecific intraventricular conduction delay Low voltage, precordial leads Minimal ST elevation, lateral leads Confirmed by Rochele Raring [phone]) on 11/11/2022 5:32:02 AM         RADIOLOGY: My personal review and interpretation of imaging:    I have personally reviewed all radiology reports.   No results found.   PROCEDURES:  Critical Care performed: Yes, see critical care procedure note(s)   CRITICAL CARE Performed by: Rochele Raring   Total critical care time: 40 minutes  Critical care time was exclusive of separately billable procedures and treating other patients.  Critical care was necessary to treat or prevent imminent or life-threatening deterioration.  Critical care was time spent personally by me on the following activities: development of treatment plan with patient and/or surrogate as well as nursing, discussions with  consultants, evaluation of patient's response to treatment, examination of patient, obtaining history from patient or surrogate, ordering and performing treatments and interventions, ordering and review of laboratory studies, ordering and review of radiographic studies, pulse oximetry and re-evaluation of patient's condition.   Marland Kitchen1-3 Lead EKG Interpretation  Performed by: Loyd Marhefka, Layla Maw, DO Authorized by: Solomiya Pascale, Layla Maw, DO     Interpretation: normal     ECG rate:  79   ECG rate assessment: normal     Rhythm: sinus rhythm     Ectopy: none     Conduction: normal       IMPRESSION / MDM / ASSESSMENT AND PLAN / ED COURSE  I reviewed the triage vital signs and the nursing notes.    Patient here with angioedema.  Similar episode about a month ago.  The patient is on the cardiac monitor to evaluate for evidence of arrhythmia and/or significant heart rate changes.   DIFFERENTIAL DIAGNOSIS (includes but not limited to):   Angioedema, possible HAE, doubt allergic reaction   Patient's presentation is most consistent with acute presentation with potential threat to life or bodily function.   PLAN: Will give IV Benadryl, Solu-Medrol, Pepcid and TXA.  Will continue to closely monitor her airway.   MEDICATIONS GIVEN IN ED: Medications  0.9 %  sodium chloride infusion (has no administration in time range)  C1 esterase inhibitor (Human) (BERINERT) injection 2,000 Units (has no administration in time range)  diphenhydrAMINE (BENADRYL) injection 25 mg (25 mg Intravenous Given 11/11/22 0517)  methylPREDNISolone sodium succinate (SOLU-MEDROL) 125 mg/2 mL injection 125  mg (125 mg Intravenous Given 11/11/22 0517)  famotidine (PEPCID) IVPB 20 mg premix (0 mg Intravenous Stopped 11/11/22 0548)  tranexamic acid (CYKLOKAPRON) IVPB 1,000 mg (0 mg Intravenous Stopped 11/11/22 0601)     ED COURSE: On reevaluation, patient states she feels like she is about the same.  Airway still patent.  It appears  that C1 esterase inhibitor may have helped her the most during her last visit but she states there was some difficulty getting it.  Will order it today.  Have recommended admission given the amount of swelling in her tongue today.  She is agreeable to this plan.    Labs unremarkable.  Normal hemoglobin, electrolytes, renal function.   CONSULTS: Consulted and discussed patient's case with hospitalist, Dr. Alvester Morin.  I have recommended admission and consulting physician agrees and will place admission orders.  Patient (and family if present) agree with this plan.   I reviewed all nursing notes, vitals, pertinent previous records.  All labs, EKGs, imaging ordered have been independently reviewed and interpreted by myself.    OUTSIDE RECORDS REVIEWED: Reviewed last orthopedic note on 11/07/2022.       FINAL CLINICAL IMPRESSION(S) / ED DIAGNOSES   Final diagnoses:  Angioedema, initial encounter     Rx / DC Orders   ED Discharge Orders     None        Note:  This document was prepared using Dragon voice recognition software and may include unintentional dictation errors.   Mirl Hillery, Layla Maw, DO 11/11/22 [phone]

## 2022-11-11 NOTE — H&P (Addendum)
History and Physical    Patient: Jacqueline Rhodes JWJ:[id number] DOB: March 18, 1955 DOA: 11/11/2022 DOS: the patient was seen and examined on 11/11/2022 PCP: Patrice Paradise, MD  Patient coming from: Home  Chief Complaint:  Chief Complaint  Patient presents with   Angioedema   HPI: Jacqueline Rhodes is a 67 y.o. female with medical history significant of obesity, rheumatoid arthritis, COPD/asthma, CAD, GERD, hypertension, hyperlipidemia, hypothyroidism OSA on CPAP presenting with angioedema.  Patient reports significant tongue swelling since around 2 AM today.  Was seen approximately 6 weeks ago in the ER for similar issues.  Was taking ARB at the time.  Patient was given C1 inhibitor as well as angioedema protocol at the time with symptomatic improvement.  Patient states she has not taken any ARB treatment since then.  No ACE inhibitor use.  Does report taking ARB for roughly 18 years prior.  Positive difficulty swallowing.  No shortness of breath.  No reported trismus.  No chest pain.  No abdominal pain.  No nausea or vomiting.  No hemiparesis or confusion. Presented to the ER afebrile, hemodynamically stable.  Satting well on room air.  White count 7.5, hemoglobin 14, platelets 251, creatinine 0.9. Review of Systems: As mentioned in the history of present illness. All other systems reviewed and are negative. Past Medical History:  Diagnosis Date   Anemia    Anginal pain (HCC)    Aortic atherosclerosis (HCC)    Asthma    B12 deficiency    Carpal tunnel syndrome    CHF (congestive heart failure) (HCC)    Chronic venous insufficiency    Complete tear of right rotator cuff    COPD (chronic obstructive pulmonary disease) (HCC)    Coronary artery disease 01/07/2005   a.) LHC 01/07/2005 (NSTEMI): LHC 60% pLAD, 70% D1, 30% pLCx, 20% pRCA, 25% dRCA - med mgmt; b.) LHC ([place]) 01/10/2008: 25/60% pLAD, 70% D1, 20% pLCx, 20% mRCA --> FFR normal --> med mgmt; c.) LHC ([place]) 01/04/2012:: 40/60% pLAD,  70% D1, 25% pLCx, 20% pRCA, 20% mRCA, 25% dRCA --> FFR normal --> med mgmt; d.) LHC ([place]) 01/10/2018: 20% pRCA, 50% pLAD, 55% oD1 -- med mgmt   Diverticulosis    DOE (dyspnea on exertion)    GERD (gastroesophageal reflux disease)    History of bilateral cataract extraction 2014   Hyperlipidemia    Hypertension    Hypothyroidism    a.) s/p LEFT hemithyroidectomy; on levothyroxine   Long term current use of aspirin    Long term current use of immunosuppressive drug    a.) MTX + secukinumab for RA/psoriatric arthritis diagnoses   Lower extremity edema    Lymphedema    NSTEMI (non-ST elevated myocardial infarction) (HCC) 01/06/2005   a.) LHC 01/07/2005: 60% pLAD, 70% D1, 30% pLCx, 20% pRCA, 25% dRCA -- med mgmt   OSA on CPAP    a.) compliance issues with prescribed nocturnal PAP therapy   Osteoarthritis    Peripheral vascular disease (HCC)    Post-menopausal osteoporosis    Psoriatic arthritis (HCC)    RA (rheumatoid arthritis) (HCC)    Renal insufficiency    T2DM (type 2 diabetes mellitus) (HCC)    Thyroid cancer (HCC)    a.) s/p LEFT hemithyroidectomy 08/2015   Past Surgical History:  Procedure Laterality Date   ABDOMINAL HYSTERECTOMY  2003   BIOPSY THYROID  2017   x 2   BREAST BIOPSY Left 2000   benign   BREAST BIOPSY Right 2015  benign   CATARACT EXTRACTION, BILATERAL  2014   left ankle surgery  2001   LEFT HEART CATH AND CORONARY ANGIOGRAPHY Left 01/10/2018   Procedure: LEFT HEART CATH AND CORONARY ANGIOGRAPHY;  Surgeon: Lamar Blinks, MD;  Location: [facility];  Service: Cardiovascular;  Laterality: Left;   LEFT HEART CATH AND CORONARY ANGIOGRAPHY Left 01/07/2005   Procedure: LEFT HEART CATH AND CORONARY ANGIOGRAPHY; Location: [facility]; Surgeon: Arnoldo Hooker, MD   LEFT HEART CATH AND CORONARY ANGIOGRAPHY Left 01/10/2008   Procedure: LEFT HEART CATH AND CORONARY ANGIOGRAPHY; Location: [facility]; Surgeon: Arnoldo Hooker, MD / Rudean Hitt, MD   LEFT HEART CATH  AND CORONARY ANGIOGRAPHY Left 01/04/2012   Procedure: LEFT HEART CATH AND CORONARY ANGIOGRAPHY; Location: [facility]; Surgeon: Arnoldo Hooker, MD / Marcina Millard, MD   left shoulder surgery  1996   right elbow surgery  1999   THYROIDECTOMY, PARTIAL Left 09/05/2015   Procedure: LEFT HEMITHYROIDECTOMY   TOTAL KNEE ARTHROPLASTY Right 05/29/2018   Procedure: TOTAL KNEE ARTHROPLASTY-RIGHT;  Surgeon: Kennedy Bucker, MD;  Location: [facility];  Service: Orthopedics;  Laterality: Right;   VEIN SURGERY Bilateral 2015   legs   Social History:  reports that she quit smoking about 19 years ago. Her smoking use included cigarettes. She started smoking about 49 years ago. She has a 15 pack-year smoking history. She has never used smokeless tobacco. She reports that she does not drink alcohol and does not use drugs.  Allergies  Allergen Reactions   Estrogens Other (See Comments)    Blood clot   Codeine Other (See Comments)    Migraine   Flexeril [Cyclobenzaprine] Hives   Losartan Swelling   Penicillins Hives    Has patient had a PCN reaction causing immediate rash, facial/tongue/throat swelling, SOB or lightheadedness with hypotension: yes Has patient had a PCN reaction causing severe rash involving mucus membranes or skin necrosis: no Has patient had a PCN reaction that required hospitalization:  no Has patient had a PCN reaction occurring within the last 10 years: no If all of the above answers are "NO", then may proceed with Cephalosporin use.    Terfenadine Hives   Vicodin [Hydrocodone-Acetaminophen] Hives    Family History  Problem Relation Age of Onset   Breast cancer Cousin        paternal side   Breast cancer Paternal Aunt        2 PATs   Breast cancer Maternal Aunt    Kidney failure Sister    Diabetes Sister    Diabetes Mother    Kidney cancer Neg Hx    Bladder Cancer Neg Hx     Prior to Admission medications   Medication Sig Start Date End Date Taking? Authorizing Provider   acetaminophen (TYLENOL) 500 MG tablet Take 2 tablets (1,000 mg total) by mouth every 8 (eight) hours. 08/02/22 08/02/23  Signa Kell, MD  albuterol (PROVENTIL HFA;VENTOLIN HFA) 108 (90 Base) MCG/ACT inhaler Inhale 2 puffs into the lungs every 6 (six) hours as needed for wheezing or shortness of breath.     [provider]  Calcium Fructoborate POWD 20 mcg by Does not apply route. Cream    [provider]  Cetirizine HCl 10 MG CAPS Take 10 mg by mouth daily.     [provider]  Cholecalciferol 25 MCG (1000 UT) TBDP Take 125 mcg by mouth.    [provider]  Coenzyme Q10 (COQ-10) 100 MG CAPS Take 100 mg by mouth daily.  [provider]  cyanocobalamin (,VITAMIN B-12,) 1000 MCG/ML injection Inject 1,000 mcg into the muscle every 30 (thirty) days.  Patient not taking: Reported on 08/02/2022 12/18/15   [provider]  dexlansoprazole (DEXILANT) 60 MG capsule Take 60 mg by mouth daily.  Patient not taking: Reported on 07/26/2022 12/18/15   [provider]  fluticasone furoate-vilanterol (BREO ELLIPTA) 100-25 MCG/INH AEPB Inhale 1 puff into the lungs daily.  01/27/17   [provider]  folic acid (FOLVITE) 1 MG tablet Take 1 mg by mouth every other day.  11/10/16   [provider]  furosemide (LASIX) 20 MG tablet Take 20 mg by mouth.    [provider]  hydrochlorothiazide (HYDRODIURIL) 25 MG tablet Take 25 mg by mouth daily.  08/23/16   [provider]  isosorbide mononitrate (IMDUR) 60 MG 24 hr tablet Take 60 mg by mouth daily.  08/23/16   [provider]  levothyroxine (SYNTHROID) 150 MCG tablet Take 150 mcg by mouth daily before breakfast.    [provider]  losartan (COZAAR) 100 MG tablet Take 100 mg by mouth daily.  08/23/16   [provider]  meclizine (ANTIVERT) 25 MG tablet Take 25 mg by mouth 3 (three) times daily as needed for dizziness.    [provider]   methotrexate (RHEUMATREX) 2.5 MG tablet Take 20 mg by mouth every Thursday.  09/06/16   [provider]  Multiple Vitamin (VITAMIN E/FOLIC ACID/B-6/B-12 PO) Take by mouth.    [provider]  nortriptyline (PAMELOR) 10 MG capsule Take 10 mg by mouth at bedtime. Patient not taking: Reported on 07/26/2022    [provider]  ondansetron (ZOFRAN-ODT) 4 MG disintegrating tablet Take 1 tablet (4 mg total) by mouth every 8 (eight) hours as needed for nausea or vomiting. 08/02/22   Signa Kell, MD  oxyCODONE (ROXICODONE) 5 MG immediate release tablet Take 1-2 tablets (5-10 mg total) by mouth every 4 (four) hours as needed (pain). 08/02/22 08/02/23  Signa Kell, MD  pantoprazole (PROTONIX) 40 MG tablet Take 40 mg by mouth daily.    [provider]  Secukinumab (COSENTYX SENSOREADY 300 DOSE) 150 MG/ML SOAJ Inject 300 mg into the skin every 28 (twenty-eight) days.  07/07/17   [provider]  simvastatin (ZOCOR) 40 MG tablet Take 20 mg by mouth at bedtime.  Patient not taking: Reported on 07/26/2022 11/10/16   [provider]  Thiamine Mononitrate POWD 2,400 mcg by Does not apply route. Patient not taking: Reported on 08/02/2022    [provider]  triamcinolone ointment (KENALOG) 0.1 % Apply 1 application topically 2 (two) times daily as needed (psoriasis). Patient not taking: Reported on 07/26/2022    [provider]    Physical Exam: Vitals:   11/11/22 0502 11/11/22 0510 11/11/22 0730  BP: (!) 180/82 (!) 144/82 137/70  Pulse: 85 79 62  Resp: 18 14 15   Temp: 98.4 F (36.9 C)    TempSrc: Oral    SpO2: 96% 98% 96%  Weight: 111.1 kg    Height: 5\' 4"  (1.626 m)     Physical Exam Constitutional:      Appearance: She is obese.  HENT:     Head: Atraumatic.     Comments: + tongue swelling      Nose: Nose normal.     Mouth/Throat:     Comments: See picture Eyes:     Extraocular Movements: Extraocular movements intact.      Pupils: Pupils are equal,  round, and reactive to light.  Musculoskeletal:        General: Normal range of motion.  Skin:    General: Skin is warm.  Neurological:     General: No focal deficit present.  Psychiatric:        Mood and Affect: Mood normal.     Data Reviewed:  There are no new results to review at this time. Lab Results  Component Value Date   WBC 7.5 11/11/2022   HGB 14.3 11/11/2022   HCT 43.9 11/11/2022   MCV 90.9 11/11/2022   PLT 251 11/11/2022   Last metabolic panel Lab Results  Component Value Date   GLUCOSE 143 (H) 11/11/2022   NA 142 11/11/2022   K 4.2 11/11/2022   CL 101 11/11/2022   CO2 26 11/11/2022   BUN 22 11/11/2022   CREATININE 0.89 11/11/2022   GFRNONAA >60 11/11/2022   CALCIUM 9.8 11/11/2022   PROT 7.2 03/30/2015   ALBUMIN 3.8 03/30/2015   BILITOT 0.8 03/30/2015   ALKPHOS 64 03/30/2015   AST 15 03/30/2015   ALT 17 03/30/2015   ANIONGAP 15 11/11/2022    Assessment and Plan: * Angioedema Positive tongue swelling overnight consistent with angioedema with noted recent episode of ARB associated angioedema within the past 6 weeks No significant airway involvement at present Patient denies any repeat ARB use Status post TXA, C1 inhibitor, IV Solu-Medrol, IV Pepcid and Benadryl Will check complement level Continue IV Pepcid and Benadryl Repeat Solu-Medrol dose this evening Monitor closely Consider outpatient allergist follow up after discharge     CAD (coronary artery disease) Stable from a cardiac standpoint Continue home regimen  Rheumatoid arthritis (HCC) Stable  On methotrexate and vitamin supplementation Consider holding methotrexate given side effect profile in the setting of recurrent angioedema prospectively    GERD (gastroesophageal reflux disease) PPI  Hypothyroidism Continue Synthroid  Asthma, chronic Stable from respiratory standpoint Continue home inhalers  Hypertension BP stable Titrate home  regimen  Diabetes mellitus without complication (HCC) SSI A1c Monitor blood sugar with steroid use    Greater than 50% was spent in counseling and coordination of care with patient Total encounter time 81 minutes or more   Advance Care Planning:   Code Status: Full Code   Consults: None   Family Communication: No family at the bedside   Severity of Illness: The appropriate patient status for this patient is OBSERVATION. Observation status is judged to be reasonable and necessary in order to provide the required intensity of service to ensure the patient's safety. The patient's presenting symptoms, physical exam findings, and initial radiographic and laboratory data in the context of their medical condition is felt to place them at decreased risk for further clinical deterioration. Furthermore, it is anticipated that the patient will be medically stable for discharge from the hospital within 2 midnights of admission.   Author: Floydene Flock, MD 11/11/2022 9:37 AM  For on call review [email or web address].

## 2022-11-11 NOTE — Assessment & Plan Note (Signed)
PPI ?

## 2022-11-11 NOTE — Assessment & Plan Note (Addendum)
Stable  On methotrexate and vitamin supplementation Consider holding methotrexate given side effect profile in the setting of recurrent angioedema prospectively

## 2022-11-11 NOTE — ED Triage Notes (Signed)
Significant tongue swelling. Pt reports throat feels swollen as well. States noted upon waking. Pt speaking and swallowing secretions at this time. Reports 1 month ago had similar and Losartan was stopped from home meds. Pt ambulatory. Breathing unlabored.

## 2022-11-11 NOTE — Assessment & Plan Note (Addendum)
BP stable Titrate home regimen 

## 2022-11-12 ENCOUNTER — Ambulatory Visit: Admission: RE | Admit: 2022-11-12 | Payer: PPO | Source: Ambulatory Visit

## 2022-11-12 DIAGNOSIS — T783XXD Angioneurotic edema, subsequent encounter: Secondary | ICD-10-CM | POA: Diagnosis not present

## 2022-11-12 LAB — COMPREHENSIVE METABOLIC PANEL
ALT: 14 U/L (ref 0–44)
AST: 12 U/L — ABNORMAL LOW (ref 15–41)
Albumin: 3.4 g/dL — ABNORMAL LOW (ref 3.5–5.0)
Alkaline Phosphatase: 55 U/L (ref 38–126)
Anion gap: 7 (ref 5–15)
BUN: 19 mg/dL (ref 8–23)
CO2: 24 mmol/L (ref 22–32)
Calcium: 8.9 mg/dL (ref 8.9–10.3)
Chloride: 109 mmol/L (ref 98–111)
Creatinine, Ser: 0.76 mg/dL (ref 0.44–1.00)
GFR, Estimated: >60 mL/min (ref >60)
Glucose, Bld: 132 mg/dL — ABNORMAL HIGH (ref 70–99)
Potassium: 3.4 mmol/L — ABNORMAL LOW (ref 3.5–5.1)
Sodium: 140 mmol/L (ref 135–145)
Total Bilirubin: 0.5 mg/dL (ref 0.3–1.2)
Total Protein: 6.2 g/dL — ABNORMAL LOW (ref 6.5–8.1)

## 2022-11-12 LAB — C4 COMPLEMENT: Complement C4, Body Fluid: 29 mg/dL (ref 12–38)

## 2022-11-12 LAB — GLUCOSE, CAPILLARY
Glucose-Capillary: 134 mg/dL — ABNORMAL HIGH (ref 70–99)
Glucose-Capillary: 162 mg/dL — ABNORMAL HIGH (ref 70–99)
Glucose-Capillary: 112 mg/dL — ABNORMAL HIGH (ref 70–99)

## 2022-11-12 LAB — CBC
HCT: 36.1 % (ref 36.0–46.0)
Hemoglobin: 12.2 g/dL (ref 12.0–15.0)
MCH: 29.8 pg (ref 26.0–34.0)
MCHC: 33.8 g/dL (ref 30.0–36.0)
MCV: 88 fL (ref 80.0–100.0)
Platelets: 206 10*3/uL (ref 150–400)
RBC: 4.1 MIL/uL (ref 3.87–5.11)
RDW: 14.3 % (ref 11.5–15.5)
WBC: 8.7 10*3/uL (ref 4.0–10.5)
nRBC: 0 % (ref 0.0–0.2)

## 2022-11-12 MED ORDER — FAMOTIDINE 20 MG PO TABS
20.0000 mg | ORAL_TABLET | Freq: Two times a day (BID) | ORAL | 0 refills | Status: DC
Start: 2022-11-12 — End: 2022-12-24

## 2022-11-12 MED ORDER — DIPHENHYDRAMINE HCL 50 MG PO TABS: 25.0000 mg | ORAL_TABLET | Freq: Three times a day (TID) | ORAL | 0 refills | Status: DC

## 2022-11-12 MED ORDER — EPINEPHRINE 0.3 MG/0.3ML IJ SOAJ: 0.3000 mg | INTRAMUSCULAR | 1 refills | Status: AC | PRN

## 2022-11-12 NOTE — Discharge Summary (Signed)
Physician Discharge Summary   Patient: Jacqueline Rhodes MRN: [id number] DOB: April 15, 1955  Admit date:     11/11/2022  Discharge date: 11/12/22  Discharge Physician: Caylee Vlachos   PCP: Patrice Paradise, MD   Recommendations at discharge:   Follow-up with primary care provider for referral to allergy and immunology  Discharge Diagnoses: Principal Problem:   Angioedema Active Problems:   Diabetes mellitus without complication (HCC)   Hypertension   Asthma, chronic   Hypothyroidism   GERD (gastroesophageal reflux disease)   Rheumatoid arthritis (HCC)   CAD (coronary artery disease)  Resolved Problems:   * No resolved hospital problems. *  Hospital Course:  Jacqueline Rhodes is a 67 y.o. female with medical history significant for obesity, rheumatoid arthritis, COPD/asthma, CAD, GERD, hypertension, hyperlipidemia, hypothyroidism OSA on CPAP presenting with angioedema.  Patient reports significant tongue swelling since around 2 AM today.  Was seen approximately 6 weeks ago in the ER for similar issues.  Was taking ARB at the time.  Patient was given C1 inhibitor as well as angioedema protocol at the time with symptomatic improvement.  Patient states she has not taken any ARB treatment since then.  No ACE inhibitor use.  Does report taking ARB for roughly 18 years prior.  Positive difficulty swallowing.  No shortness of breath.  No reported trismus.  No chest pain.  No abdominal pain.  No nausea or vomiting.  No hemiparesis or confusion. Presented to the ER afebrile, hemodynamically stable.  Satting well on room air.  White count 7.5, hemoglobin 14, platelets 251, creatinine 0.9. Review of Systems: As mentioned in the history of present illness. All other systems reviewed and are negative.    Assessment and Plan:  * Angioedema Positive tongue swelling overnight consistent with angioedema with noted recent episode of ARB associated angioedema within the past 6 weeks No  significant airway involvement on admission and symptoms have resolved Patient denies any repeat ARB use Concern for possible hereditary angioedema versus recurrent angioedema related to history of ARB use Patient received TXA, C1 inhibitor, IV Solu-Medrol, IV Pepcid and Benadryl in the emergency room She also received IV Solu-Medrol, Benadryl and Pepcid in the hospital Will discharge home on 3 days of Benadryl and Pepcid as well as an EpiPen Patient will need referral to an allergy and immunologist as an outpatient         CAD (coronary artery disease) Stable  Continue nitrates     Rheumatoid arthritis (HCC) Stable  On methotrexate and Folic acid     GERD (gastroesophageal reflux disease) Continue PPI    Hypothyroidism Continue Synthroid    Asthma, chronic Stable from respiratory standpoint Continue home inhalers    Hypertension BP stable Continue nitrates    Diabetes mellitus without complication (HCC) Maintain consistent carbohydrate diet Check blood sugars daily        Consultants: None Procedures performed: None Disposition: Home Diet recommendation:  Discharge Diet Orders (From admission, onward)     Start     Ordered   11/12/22 0000  Diet - low sodium heart healthy        11/12/22 1155           Cardiac and Carb modified diet DISCHARGE MEDICATION: Allergies as of 11/12/2022       Reactions   Estrogens Other (See Comments)   Blood clot   Losartan Swelling   Tongue swelling   Codeine Other (See Comments)   Migraine   Flexeril [cyclobenzaprine] Hives   Penicillins  Hives   Has patient had a PCN reaction causing immediate rash, facial/tongue/throat swelling, SOB or lightheadedness with hypotension: yes Has patient had a PCN reaction causing severe rash involving mucus membranes or skin necrosis: no Has patient had a PCN reaction that required hospitalization:  no Has patient had a PCN reaction occurring within the last 10 years: no If  all of the above answers are "NO", then may proceed with Cephalosporin use.   Terfenadine Hives   Vicodin [hydrocodone-acetaminophen] Hives        Medication List     STOP taking these medications    cyanocobalamin 1000 MCG/ML injection Commonly known as: VITAMIN B12   Dexilant 60 MG capsule Generic drug: dexlansoprazole   nortriptyline 10 MG capsule Commonly known as: PAMELOR   ondansetron 4 MG disintegrating tablet Commonly known as: ZOFRAN-ODT   oxyCODONE 5 MG immediate release tablet Commonly known as: Roxicodone   simvastatin 40 MG tablet Commonly known as: ZOCOR   Thiamine Mononitrate Powd   triamcinolone ointment 0.1 % Commonly known as: KENALOG   VITAMIN E/FOLIC ACID/B-6/B-12 PO       TAKE these medications    acetaminophen 500 MG tablet Commonly known as: TYLENOL Take 2 tablets (1,000 mg total) by mouth every 8 (eight) hours.   albuterol 108 (90 Base) MCG/ACT inhaler Commonly known as: VENTOLIN HFA Inhale 2 puffs into the lungs every 6 (six) hours as needed for wheezing or shortness of breath.   Breo Ellipta 100-25 MCG/INH Aepb Generic drug: fluticasone furoate-vilanterol Inhale 1 puff into the lungs daily.   Calcium Fructoborate Powd 20 mcg by Does not apply route. Cream   Cetirizine HCl 10 MG Caps Take 10 mg by mouth daily.   Cholecalciferol 25 MCG (1000 UT) Tbdp Take 125 mcg by mouth.   CoQ-10 100 MG Caps Take 100 mg by mouth daily.   Cosentyx Sensoready (300 MG) 150 MG/ML Soaj Generic drug: Secukinumab Inject 300 mg into the skin every 28 (twenty-eight) days.   diphenhydrAMINE 50 MG tablet Commonly known as: BENADRYL Take 0.5 tablets (25 mg total) by mouth every 8 (eight) hours for 3 days.   EPINEPHrine 0.3 mg/0.3 mL Soaj injection Commonly known as: EpiPen 2-Pak Inject 0.3 mg into the muscle as needed for anaphylaxis.   famotidine 20 MG tablet Commonly known as: PEPCID Take 1 tablet (20 mg total) by mouth 2 (two) times  daily for 3 days.   folic acid 1 MG tablet Commonly known as: FOLVITE Take 1 mg by mouth every other day.   furosemide 20 MG tablet Commonly known as: LASIX Take 20 mg by mouth daily as needed for fluid or edema.   hydrochlorothiazide 25 MG tablet Commonly known as: HYDRODIURIL Take 25 mg by mouth daily.   isosorbide mononitrate 60 MG 24 hr tablet Commonly known as: IMDUR Take 60 mg by mouth daily.   levothyroxine 150 MCG tablet Commonly known as: SYNTHROID Take 150 mcg by mouth daily before breakfast.   meclizine 25 MG tablet Commonly known as: ANTIVERT Take 25 mg by mouth 3 (three) times daily as needed for dizziness.   methotrexate 2.5 MG tablet Commonly known as: RHEUMATREX Take 20 mg by mouth every Thursday.   pantoprazole 40 MG tablet Commonly known as: PROTONIX Take 40 mg by mouth 2 (two) times daily.        Discharge Exam: Filed Weights   11/11/22 0502  Weight: 111.1 kg    Constitutional:      Appearance: She is obese.  HENT:     Head: Atraumatic.     Comments: Tongue swelling has resolved      Nose: Nose normal.     Mouth/Throat:     Comments: Eyes:     Extraocular Movements: Extraocular movements intact.     Pupils: Pupils are equal, round, and reactive to light.  Musculoskeletal:        General: Normal range of motion.  Skin:    General: Skin is warm.  Neurological:     General: No focal deficit present.  Psychiatric:        Mood and Affect: Mood normal.     Condition at discharge: stable    The results of significant diagnostics from this hospitalization (including imaging, microbiology, ancillary and laboratory) are listed below for reference.   Imaging Studies: MR SHOULDER RIGHT WO CONTRAST  Result Date: 11/12/2022 CLINICAL DATA:  Shoulder pain, chronic, inflammatory arthritis suspected, xray done History of rotator cuff tear with surgical repair, subacromial decompression and biceps tenodesis 08/02/2022. EXAM: MRI OF THE RIGHT  SHOULDER WITHOUT CONTRAST TECHNIQUE: Multiplanar, multisequence MR imaging of the shoulder was performed. No intravenous contrast was administered. COMPARISON:  Right shoulder MRI 07/02/2022. FINDINGS: Technical note. Image quality for the current examination is much better than the previous study. Comparison limited by motion on the prior study. Rotator cuff: Interval postsurgical changes consistent with rotator cuff repair. No displaced anchor pins are identified. However, there is a recurrent full-thickness, moderately retracted tear of the supraspinatus tendon. The infraspinatus tendon appears intact with mild tendinosis. No significant abnormality of the subscapularis or teres minor tendons identified. Muscles: Grossly stable mild atrophy of the supraspinatus and infraspinatus muscles. Biceps long head: Interval bicipital tenodesis. The biceps tendon distal to anchor screws in the bicipital groove is not well seen on the sagittal or coronal images, although a small tendon is visualized on the axial images. Acromioclavicular Joint: Interval distal clavicle resection and subacromial decompression. No osseous encroachment on the rotator cuff passageway. Fluid extends from the shoulder joint into the subacromial space via the recurrent full-thickness rotator cuff tear. Glenohumeral Joint: Mild glenohumeral degenerative changes without focal chondral defect. Small shoulder joint effusion. Labrum: Probable surgical debridement of the superior labrum.No evidence of labral tear or paralabral cyst. Bones: There is marrow edema anteriorly in the humeral metaphysis which may relate to the biceps tenodesis screws. Cannot exclude a stress fracture. No other acute osseous findings are identified. Additional postsurgical changes as described above. Other: No significant soft tissue findings. IMPRESSION: 1. Recurrent full-thickness, moderately retracted tear of the supraspinatus tendon status post rotator cuff repair. 2.  Interval bicipital tenodesis. The biceps tendon distal to the tenodesis screws appears intact on the axial images. 3. Marrow edema anteriorly in the humeral metaphysis may relate to the biceps tenodesis screws. Cannot exclude a stress fracture. 4. Mild glenohumeral degenerative changes. 5. Additional postsurgical changes as described. Electronically Signed   By: Carey Bullocks M.D.   On: 11/12/2022 09:24    Microbiology: Results for orders placed or performed during the hospital encounter of 05/16/18  Urine culture     Status: Abnormal   Collection Time: 05/16/18  9:11 AM   Specimen: Urine, Random  Result Value Ref Range Status   Specimen Description   Final    URINE, RANDOM Performed at [facility], [address], [place], [place] [address]    Special Requests   Final    NONE Performed at [facility], [address], [place], [place] [address]  Culture (A)  Final    <10,000 COLONIES/mL INSIGNIFICANT GROWTH Performed at [facility], [address], [place], [place] [address]    Report Status 05/17/2018 FINAL  Final  Surgical pcr screen     Status: None   Collection Time: 05/16/18  9:11 AM   Specimen: Nasal Mucosa; Nasal Swab  Result Value Ref Range Status   MRSA, PCR NEGATIVE NEGATIVE Final   Staphylococcus aureus NEGATIVE NEGATIVE Final    Comment: (NOTE) The Xpert SA Assay (FDA approved for NASAL specimens in patients 53 years of age and older), is one component of a comprehensive surveillance program. It is not intended to diagnose infection nor to guide or monitor treatment. Performed at [facility], [address] [place], [place] [address]     Labs: CBC: Recent Labs  Lab 11/11/22 0510 11/12/22 0448  WBC 7.5 8.7  NEUTROABS 3.7  --   HGB 14.3 12.2  HCT 43.9 36.1  MCV 90.9 88.0  PLT 251 206   Basic Metabolic Panel: Recent Labs  Lab 11/11/22 0510 11/12/22 0448  NA 142 140  K 4.2 3.4*  CL 101 109  CO2 26 24   GLUCOSE 143* 132*  BUN 22 19  CREATININE 0.89 0.76  CALCIUM 9.8 8.9   Liver Function Tests: Recent Labs  Lab 11/12/22 0448  AST 12*  ALT 14  ALKPHOS 55  BILITOT 0.5  PROT 6.2*  ALBUMIN 3.4*   CBG: Recent Labs  Lab 11/11/22 2040 11/11/22 2124 11/11/22 2325 11/12/22 0352 11/12/22 1017  GLUCAP 164* 171* 136* 134* 162*    Discharge time spent: greater than 30 minutes.  Signed: Lucile Shutters, MD [facility] 11/12/2022

## 2022-11-12 NOTE — Progress Notes (Signed)
Discharge instructions reviewed with patient including followup visits and new medications.  Understanding was verbalized and all questions were answered.  IV removed without complication; patient tolerated well.  Patient discharged home via wheelchair in stable condition escorted by nursing staff.  

## 2022-11-16 DIAGNOSIS — T783XXD Angioneurotic edema, subsequent encounter: Secondary | ICD-10-CM | POA: Diagnosis not present

## 2022-11-16 DIAGNOSIS — R519 Headache, unspecified: Secondary | ICD-10-CM | POA: Diagnosis not present

## 2022-11-16 DIAGNOSIS — G8929 Other chronic pain: Secondary | ICD-10-CM | POA: Diagnosis not present

## 2022-11-16 DIAGNOSIS — M25511 Pain in right shoulder: Secondary | ICD-10-CM | POA: Diagnosis not present

## 2022-11-16 DIAGNOSIS — G44201 Tension-type headache, unspecified, intractable: Secondary | ICD-10-CM | POA: Diagnosis not present

## 2022-11-23 DIAGNOSIS — M75121 Complete rotator cuff tear or rupture of right shoulder, not specified as traumatic: Secondary | ICD-10-CM | POA: Diagnosis not present

## 2022-12-20 ENCOUNTER — Encounter
Admission: EM | Disposition: A | Discharge status: Home / Self Care | Payer: Self-pay | Source: Home / Self Care | Attending: Internal Medicine

## 2022-12-20 ENCOUNTER — Emergency Department: Payer: PPO

## 2022-12-20 ENCOUNTER — Encounter: Payer: Self-pay | Admitting: Emergency Medicine

## 2022-12-20 ENCOUNTER — Inpatient Hospital Stay
Admission: EM | Admit: 2022-12-20 | Discharge: 2022-12-24 | DRG: 321 | Disposition: A | Discharge status: Home / Self Care | Payer: PPO | Attending: Internal Medicine | Admitting: Internal Medicine

## 2022-12-20 DIAGNOSIS — T783XXA Angioneurotic edema, initial encounter: Secondary | ICD-10-CM | POA: Diagnosis not present

## 2022-12-20 DIAGNOSIS — R079 Chest pain, unspecified: Secondary | ICD-10-CM | POA: Diagnosis not present

## 2022-12-20 DIAGNOSIS — Z6841 Body Mass Index (BMI) 40.0 and over, adult: Secondary | ICD-10-CM | POA: Diagnosis not present

## 2022-12-20 DIAGNOSIS — I5021 Acute systolic (congestive) heart failure: Secondary | ICD-10-CM | POA: Diagnosis not present

## 2022-12-20 DIAGNOSIS — Z803 Family history of malignant neoplasm of breast: Secondary | ICD-10-CM

## 2022-12-20 DIAGNOSIS — I10 Essential (primary) hypertension: Secondary | ICD-10-CM | POA: Diagnosis not present

## 2022-12-20 DIAGNOSIS — I493 Ventricular premature depolarization: Secondary | ICD-10-CM | POA: Diagnosis present

## 2022-12-20 DIAGNOSIS — M069 Rheumatoid arthritis, unspecified: Secondary | ICD-10-CM | POA: Diagnosis present

## 2022-12-20 DIAGNOSIS — T783XXS Angioneurotic edema, sequela: Secondary | ICD-10-CM | POA: Diagnosis not present

## 2022-12-20 DIAGNOSIS — I252 Old myocardial infarction: Secondary | ICD-10-CM

## 2022-12-20 DIAGNOSIS — I872 Venous insufficiency (chronic) (peripheral): Secondary | ICD-10-CM | POA: Diagnosis present

## 2022-12-20 DIAGNOSIS — E66813 Obesity, class 3: Secondary | ICD-10-CM | POA: Diagnosis not present

## 2022-12-20 DIAGNOSIS — X58XXXA Exposure to other specified factors, initial encounter: Secondary | ICD-10-CM | POA: Diagnosis present

## 2022-12-20 DIAGNOSIS — Z7960 Long term (current) use of unspecified immunomodulators and immunosuppressants: Secondary | ICD-10-CM

## 2022-12-20 DIAGNOSIS — L4050 Arthropathic psoriasis, unspecified: Secondary | ICD-10-CM | POA: Diagnosis present

## 2022-12-20 DIAGNOSIS — J4520 Mild intermittent asthma, uncomplicated: Secondary | ICD-10-CM

## 2022-12-20 DIAGNOSIS — I517 Cardiomegaly: Secondary | ICD-10-CM | POA: Diagnosis not present

## 2022-12-20 DIAGNOSIS — I213 ST elevation (STEMI) myocardial infarction of unspecified site: Secondary | ICD-10-CM | POA: Diagnosis not present

## 2022-12-20 DIAGNOSIS — I2510 Atherosclerotic heart disease of native coronary artery without angina pectoris: Secondary | ICD-10-CM | POA: Diagnosis present

## 2022-12-20 DIAGNOSIS — I255 Ischemic cardiomyopathy: Secondary | ICD-10-CM | POA: Diagnosis not present

## 2022-12-20 DIAGNOSIS — D72829 Elevated white blood cell count, unspecified: Secondary | ICD-10-CM | POA: Diagnosis present

## 2022-12-20 DIAGNOSIS — K219 Gastro-esophageal reflux disease without esophagitis: Secondary | ICD-10-CM | POA: Diagnosis not present

## 2022-12-20 DIAGNOSIS — E039 Hypothyroidism, unspecified: Secondary | ICD-10-CM | POA: Diagnosis not present

## 2022-12-20 DIAGNOSIS — J45909 Unspecified asthma, uncomplicated: Secondary | ICD-10-CM | POA: Diagnosis present

## 2022-12-20 DIAGNOSIS — E1151 Type 2 diabetes mellitus with diabetic peripheral angiopathy without gangrene: Secondary | ICD-10-CM | POA: Diagnosis present

## 2022-12-20 DIAGNOSIS — I110 Hypertensive heart disease with heart failure: Secondary | ICD-10-CM | POA: Diagnosis not present

## 2022-12-20 DIAGNOSIS — E538 Deficiency of other specified B group vitamins: Secondary | ICD-10-CM | POA: Diagnosis not present

## 2022-12-20 DIAGNOSIS — Z9842 Cataract extraction status, left eye: Secondary | ICD-10-CM

## 2022-12-20 DIAGNOSIS — Z880 Allergy status to penicillin: Secondary | ICD-10-CM

## 2022-12-20 DIAGNOSIS — Z79899 Other long term (current) drug therapy: Secondary | ICD-10-CM

## 2022-12-20 DIAGNOSIS — Z90710 Acquired absence of both cervix and uterus: Secondary | ICD-10-CM

## 2022-12-20 DIAGNOSIS — G4733 Obstructive sleep apnea (adult) (pediatric): Secondary | ICD-10-CM | POA: Diagnosis present

## 2022-12-20 DIAGNOSIS — Z96651 Presence of right artificial knee joint: Secondary | ICD-10-CM | POA: Diagnosis present

## 2022-12-20 DIAGNOSIS — E890 Postprocedural hypothyroidism: Secondary | ICD-10-CM | POA: Diagnosis not present

## 2022-12-20 DIAGNOSIS — R011 Cardiac murmur, unspecified: Secondary | ICD-10-CM | POA: Diagnosis present

## 2022-12-20 DIAGNOSIS — I2109 ST elevation (STEMI) myocardial infarction involving other coronary artery of anterior wall: Secondary | ICD-10-CM | POA: Diagnosis present

## 2022-12-20 DIAGNOSIS — I890 Lymphedema, not elsewhere classified: Secondary | ICD-10-CM | POA: Diagnosis present

## 2022-12-20 DIAGNOSIS — Z91199 Patient's noncompliance with other medical treatment and regimen due to unspecified reason: Secondary | ICD-10-CM

## 2022-12-20 DIAGNOSIS — Z7902 Long term (current) use of antithrombotics/antiplatelets: Secondary | ICD-10-CM

## 2022-12-20 DIAGNOSIS — Z7982 Long term (current) use of aspirin: Secondary | ICD-10-CM

## 2022-12-20 DIAGNOSIS — Z87891 Personal history of nicotine dependence: Secondary | ICD-10-CM

## 2022-12-20 DIAGNOSIS — Z85850 Personal history of malignant neoplasm of thyroid: Secondary | ICD-10-CM

## 2022-12-20 DIAGNOSIS — Z885 Allergy status to narcotic agent status: Secondary | ICD-10-CM

## 2022-12-20 DIAGNOSIS — Z833 Family history of diabetes mellitus: Secondary | ICD-10-CM

## 2022-12-20 DIAGNOSIS — Z79890 Hormone replacement therapy (postmenopausal): Secondary | ICD-10-CM

## 2022-12-20 DIAGNOSIS — Z9861 Coronary angioplasty status: Secondary | ICD-10-CM | POA: Diagnosis not present

## 2022-12-20 DIAGNOSIS — E876 Hypokalemia: Secondary | ICD-10-CM | POA: Diagnosis not present

## 2022-12-20 DIAGNOSIS — Z888 Allergy status to other drugs, medicaments and biological substances status: Secondary | ICD-10-CM

## 2022-12-20 DIAGNOSIS — I2102 ST elevation (STEMI) myocardial infarction involving left anterior descending coronary artery: Secondary | ICD-10-CM | POA: Diagnosis not present

## 2022-12-20 DIAGNOSIS — M810 Age-related osteoporosis without current pathological fracture: Secondary | ICD-10-CM | POA: Diagnosis present

## 2022-12-20 DIAGNOSIS — R001 Bradycardia, unspecified: Secondary | ICD-10-CM | POA: Diagnosis not present

## 2022-12-20 DIAGNOSIS — E785 Hyperlipidemia, unspecified: Secondary | ICD-10-CM | POA: Diagnosis not present

## 2022-12-20 DIAGNOSIS — M1990 Unspecified osteoarthritis, unspecified site: Secondary | ICD-10-CM | POA: Diagnosis present

## 2022-12-20 DIAGNOSIS — Z841 Family history of disorders of kidney and ureter: Secondary | ICD-10-CM

## 2022-12-20 DIAGNOSIS — I700 Atherosclerosis of aorta: Secondary | ICD-10-CM | POA: Diagnosis not present

## 2022-12-20 DIAGNOSIS — J4489 Other specified chronic obstructive pulmonary disease: Secondary | ICD-10-CM | POA: Diagnosis not present

## 2022-12-20 DIAGNOSIS — I472 Ventricular tachycardia, unspecified: Secondary | ICD-10-CM | POA: Diagnosis not present

## 2022-12-20 DIAGNOSIS — Z9841 Cataract extraction status, right eye: Secondary | ICD-10-CM

## 2022-12-20 DIAGNOSIS — Z9889 Other specified postprocedural states: Secondary | ICD-10-CM

## 2022-12-20 HISTORY — PX: CORONARY/GRAFT ACUTE MI REVASCULARIZATION: CATH118305

## 2022-12-20 HISTORY — PX: LEFT HEART CATH AND CORONARY ANGIOGRAPHY: CATH118249

## 2022-12-20 LAB — CBC WITH DIFFERENTIAL/PLATELET
Abs Immature Granulocytes: 0.04 10*3/uL (ref 0.00–0.07)
Basophils Absolute: 0.1 10*3/uL (ref 0.0–0.1)
Basophils Relative: 1 %
Eosinophils Absolute: 0.7 10*3/uL — ABNORMAL HIGH (ref 0.0–0.5)
Eosinophils Relative: 6 %
HCT: 41.6 % (ref 36.0–46.0)
Hemoglobin: 13.9 g/dL (ref 12.0–15.0)
Immature Granulocytes: 0 %
Lymphocytes Relative: 42 %
Lymphs Abs: 4.9 10*3/uL — ABNORMAL HIGH (ref 0.7–4.0)
MCH: 29.4 pg (ref 26.0–34.0)
MCHC: 33.4 g/dL (ref 30.0–36.0)
MCV: 88.1 fL (ref 80.0–100.0)
Monocytes Absolute: 1 10*3/uL (ref 0.1–1.0)
Monocytes Relative: 8 %
Neutro Abs: 4.9 10*3/uL (ref 1.7–7.7)
Neutrophils Relative %: 43 %
Platelets: 334 10*3/uL (ref 150–400)
RBC: 4.72 MIL/uL (ref 3.87–5.11)
RDW: 13.5 % (ref 11.5–15.5)
WBC: 11.5 10*3/uL — ABNORMAL HIGH (ref 4.0–10.5)
nRBC: 0 % (ref 0.0–0.2)

## 2022-12-20 LAB — BASIC METABOLIC PANEL
Anion gap: 10 (ref 5–15)
BUN: 22 mg/dL (ref 8–23)
CO2: 26 mmol/L (ref 22–32)
Calcium: 9.1 mg/dL (ref 8.9–10.3)
Chloride: 100 mmol/L (ref 98–111)
Creatinine, Ser: 1.15 mg/dL — ABNORMAL HIGH (ref 0.44–1.00)
GFR, Estimated: 52 mL/min — ABNORMAL LOW (ref >60)
Glucose, Bld: 188 mg/dL — ABNORMAL HIGH (ref 70–99)
Potassium: 2.7 mmol/L — CL (ref 3.5–5.1)
Sodium: 136 mmol/L (ref 135–145)

## 2022-12-20 LAB — HEPATIC FUNCTION PANEL
ALT: 15 U/L (ref 0–44)
AST: 17 U/L (ref 15–41)
Albumin: 3.7 g/dL (ref 3.5–5.0)
Alkaline Phosphatase: 70 U/L (ref 38–126)
Bilirubin, Direct: 0.1 mg/dL (ref 0.0–0.2)
Indirect Bilirubin: 0.6 mg/dL (ref 0.3–0.9)
Total Bilirubin: 0.7 mg/dL (ref 0.3–1.2)
Total Protein: 7.1 g/dL (ref 6.5–8.1)

## 2022-12-20 LAB — POCT ACTIVATED CLOTTING TIME
Activated Clotting Time: 257 s
Activated Clotting Time: 275 s
Activated Clotting Time: 293 s

## 2022-12-20 LAB — TROPONIN I (HIGH SENSITIVITY): Troponin I (High Sensitivity): 7 ng/L (ref <18)

## 2022-12-20 LAB — GLUCOSE, CAPILLARY: Glucose-Capillary: 203 mg/dL — ABNORMAL HIGH (ref 70–99)

## 2022-12-20 SURGERY — CORONARY/GRAFT ACUTE MI REVASCULARIZATION
Anesthesia: Moderate Sedation

## 2022-12-20 MED ORDER — ATORVASTATIN CALCIUM 80 MG PO TABS
80.0000 mg | ORAL_TABLET | Freq: Every day | ORAL | Status: DC
  Filled 2022-12-20: qty 1

## 2022-12-20 MED ORDER — FLUTICASONE FUROATE-VILANTEROL 100-25 MCG/ACT IN AEPB
1.0000 | INHALATION_SPRAY | Freq: Every day | RESPIRATORY_TRACT | Status: DC
  Administered 2022-12-21 – 2022-12-24 (×4): 1 via RESPIRATORY_TRACT
  Filled 2022-12-20: qty 28

## 2022-12-20 MED ORDER — HYDRALAZINE HCL 20 MG/ML IJ SOLN: 10.0000 mg | INTRAMUSCULAR | Status: AC | PRN

## 2022-12-20 MED ORDER — LORATADINE 10 MG PO TABS
10.0000 mg | ORAL_TABLET | Freq: Every day | ORAL | Status: DC
  Administered 2022-12-22: 10 mg via ORAL
  Filled 2022-12-20 (×3): qty 1

## 2022-12-20 MED ORDER — DIPHENHYDRAMINE HCL 50 MG/ML IJ SOLN
50.0000 mg | Freq: Once | INTRAMUSCULAR | Status: AC
  Administered 2022-12-20: 50 mg via INTRAVENOUS
  Filled 2022-12-20: qty 1

## 2022-12-20 MED ORDER — C1 ESTERASE INHIBITOR (HUMAN) 500 UNITS IV KIT
2000.0000 [IU] | PACK | Freq: Once | INTRAVENOUS | Status: DC
  Filled 2022-12-20: qty 2000

## 2022-12-20 MED ORDER — HEPARIN (PORCINE) IN NACL 1000-0.9 UT/500ML-% IV SOLN
INTRAVENOUS | Status: DC | PRN
  Administered 2022-12-20 (×2): 500 mL

## 2022-12-20 MED ORDER — SODIUM CHLORIDE 0.9 % IV SOLN: INTRAVENOUS | Status: DC

## 2022-12-20 MED ORDER — LIDOCAINE HCL (PF) 1 % IJ SOLN
INTRAMUSCULAR | Status: DC | PRN
  Administered 2022-12-20: 2 mL

## 2022-12-20 MED ORDER — FENTANYL CITRATE (PF) 100 MCG/2ML IJ SOLN
INTRAMUSCULAR | Status: AC
  Filled 2022-12-20: qty 2

## 2022-12-20 MED ORDER — NITROGLYCERIN 1 MG/10 ML FOR IR/CATH LAB
INTRA_ARTERIAL | Status: DC | PRN
  Administered 2022-12-20 (×3): 200 ug

## 2022-12-20 MED ORDER — HEPARIN SODIUM (PORCINE) 1000 UNIT/ML IJ SOLN
INTRAMUSCULAR | Status: AC
  Filled 2022-12-20: qty 10

## 2022-12-20 MED ORDER — TICAGRELOR 90 MG PO TABS
ORAL_TABLET | ORAL | Status: DC | PRN
  Administered 2022-12-20: 180 mg via ORAL

## 2022-12-20 MED ORDER — ONDANSETRON HCL 4 MG/2ML IJ SOLN
4.0000 mg | Freq: Four times a day (QID) | INTRAMUSCULAR | Status: DC | PRN
  Administered 2022-12-23: 4 mg via INTRAVENOUS
  Filled 2022-12-20: qty 2

## 2022-12-20 MED ORDER — NITROGLYCERIN IN D5W 200-5 MCG/ML-% IV SOLN
0.0000 ug/min | INTRAVENOUS | Status: DC
  Administered 2022-12-20: 10 ug/min via INTRAVENOUS

## 2022-12-20 MED ORDER — FUROSEMIDE 10 MG/ML IJ SOLN
20.0000 mg | Freq: Once | INTRAMUSCULAR | Status: AC
  Administered 2022-12-21: 20 mg via INTRAVENOUS
  Filled 2022-12-20: qty 2

## 2022-12-20 MED ORDER — VERAPAMIL HCL 2.5 MG/ML IV SOLN
INTRAVENOUS | Status: DC | PRN
  Administered 2022-12-20 (×2): 2.5 mg via INTRAVENOUS

## 2022-12-20 MED ORDER — VERAPAMIL HCL 2.5 MG/ML IV SOLN
INTRAVENOUS | Status: AC
  Filled 2022-12-20: qty 2

## 2022-12-20 MED ORDER — POTASSIUM CHLORIDE CRYS ER 20 MEQ PO TBCR
40.0000 meq | EXTENDED_RELEASE_TABLET | ORAL | Status: AC
  Administered 2022-12-21 (×2): 40 meq via ORAL
  Filled 2022-12-20 (×2): qty 2

## 2022-12-20 MED ORDER — HEPARIN (PORCINE) 25000 UT/250ML-% IV SOLN
1100.0000 [IU]/h | INTRAVENOUS | Status: DC
  Filled 2022-12-20: qty 250

## 2022-12-20 MED ORDER — NITROGLYCERIN IN D5W 200-5 MCG/ML-% IV SOLN
INTRAVENOUS | Status: AC
  Filled 2022-12-20: qty 250

## 2022-12-20 MED ORDER — ALBUTEROL SULFATE (2.5 MG/3ML) 0.083% IN NEBU: 2.5000 mg | INHALATION_SOLUTION | Freq: Four times a day (QID) | RESPIRATORY_TRACT | Status: DC | PRN

## 2022-12-20 MED ORDER — MORPHINE SULFATE (PF) 2 MG/ML IV SOLN
2.0000 mg | INTRAVENOUS | Status: DC | PRN
  Administered 2022-12-21 – 2022-12-23 (×3): 2 mg via INTRAVENOUS
  Filled 2022-12-20 (×3): qty 1

## 2022-12-20 MED ORDER — SODIUM CHLORIDE 0.9 % IV SOLN: 250.0000 mL | INTRAVENOUS | Status: DC | PRN

## 2022-12-20 MED ORDER — SODIUM CHLORIDE 0.9% FLUSH: 3.0000 mL | INTRAVENOUS | Status: DC | PRN

## 2022-12-20 MED ORDER — LEVOTHYROXINE SODIUM 50 MCG PO TABS
150.0000 ug | ORAL_TABLET | Freq: Every day | ORAL | Status: DC
  Administered 2022-12-21 – 2022-12-24 (×4): 150 ug via ORAL
  Filled 2022-12-20: qty 3
  Filled 2022-12-20 (×4): qty 1
  Filled 2022-12-20: qty 3

## 2022-12-20 MED ORDER — CANGRELOR TETRASODIUM 50 MG IV SOLR
INTRAVENOUS | Status: AC
  Filled 2022-12-20: qty 50

## 2022-12-20 MED ORDER — IOHEXOL 300 MG/ML  SOLN
INTRAMUSCULAR | Status: DC | PRN
  Administered 2022-12-20: 129 mL

## 2022-12-20 MED ORDER — PANTOPRAZOLE SODIUM 40 MG PO TBEC
40.0000 mg | DELAYED_RELEASE_TABLET | Freq: Two times a day (BID) | ORAL | Status: DC
  Administered 2022-12-21 – 2022-12-24 (×8): 40 mg via ORAL
  Filled 2022-12-20 (×8): qty 1

## 2022-12-20 MED ORDER — SODIUM CHLORIDE 0.9 % IV SOLN
4.0000 ug/kg/min | INTRAVENOUS | Status: AC
  Administered 2022-12-20: 4 ug/kg/min via INTRAVENOUS
  Filled 2022-12-20 (×2): qty 50

## 2022-12-20 MED ORDER — TICAGRELOR 90 MG PO TABS
ORAL_TABLET | ORAL | Status: AC
  Filled 2022-12-20: qty 2

## 2022-12-20 MED ORDER — HEPARIN SODIUM (PORCINE) 1000 UNIT/ML IJ SOLN
INTRAMUSCULAR | Status: DC | PRN
  Administered 2022-12-20: 2000 [IU] via INTRAVENOUS
  Administered 2022-12-20: 3000 [IU] via INTRAVENOUS
  Administered 2022-12-20: 6000 [IU] via INTRAVENOUS
  Administered 2022-12-20: 2000 [IU] via INTRAVENOUS

## 2022-12-20 MED ORDER — HEPARIN (PORCINE) IN NACL 1000-0.9 UT/500ML-% IV SOLN
INTRAVENOUS | Status: AC
  Filled 2022-12-20: qty 1000

## 2022-12-20 MED ORDER — TRANEXAMIC ACID-NACL 1000-0.7 MG/100ML-% IV SOLN
1000.0000 mg | Freq: Once | INTRAVENOUS | Status: DC
  Filled 2022-12-20: qty 100

## 2022-12-20 MED ORDER — HEPARIN BOLUS VIA INFUSION
4000.0000 [IU] | Freq: Once | INTRAVENOUS | Status: AC
  Administered 2022-12-20: 4000 [IU] via INTRAVENOUS
  Filled 2022-12-20: qty 4000

## 2022-12-20 MED ORDER — METHYLPREDNISOLONE SODIUM SUCC 125 MG IJ SOLR
125.0000 mg | Freq: Once | INTRAMUSCULAR | Status: AC
  Administered 2022-12-20: 125 mg via INTRAVENOUS
  Filled 2022-12-20: qty 2

## 2022-12-20 MED ORDER — FAMOTIDINE IN NACL 20-0.9 MG/50ML-% IV SOLN
20.0000 mg | Freq: Once | INTRAVENOUS | Status: AC
  Administered 2022-12-20: 20 mg via INTRAVENOUS
  Filled 2022-12-20: qty 50

## 2022-12-20 MED ORDER — ACETAMINOPHEN 325 MG PO TABS
650.0000 mg | ORAL_TABLET | ORAL | Status: DC | PRN
  Administered 2022-12-21 – 2022-12-24 (×7): 650 mg via ORAL
  Filled 2022-12-20 (×7): qty 2

## 2022-12-20 MED ORDER — ONDANSETRON HCL 4 MG/2ML IJ SOLN
4.0000 mg | Freq: Once | INTRAMUSCULAR | Status: AC
  Administered 2022-12-20: 4 mg via INTRAVENOUS
  Filled 2022-12-20: qty 2

## 2022-12-20 MED ORDER — SODIUM CHLORIDE 0.9% FLUSH: 3.0000 mL | Freq: Two times a day (BID) | INTRAVENOUS | Status: DC

## 2022-12-20 MED ORDER — FOLIC ACID 1 MG PO TABS
1.0000 mg | ORAL_TABLET | ORAL | Status: DC
  Administered 2022-12-21 – 2022-12-23 (×2): 1 mg via ORAL
  Filled 2022-12-20 (×2): qty 1

## 2022-12-20 MED ORDER — SODIUM CHLORIDE 0.9% FLUSH
3.0000 mL | Freq: Two times a day (BID) | INTRAVENOUS | Status: DC
  Administered 2022-12-21 – 2022-12-24 (×8): 3 mL via INTRAVENOUS

## 2022-12-20 MED ORDER — ASPIRIN 81 MG PO CHEW
81.0000 mg | CHEWABLE_TABLET | Freq: Every day | ORAL | Status: DC
  Administered 2022-12-21 – 2022-12-24 (×4): 81 mg via ORAL
  Filled 2022-12-20 (×4): qty 1

## 2022-12-20 MED ORDER — MORPHINE SULFATE (PF) 4 MG/ML IV SOLN
4.0000 mg | Freq: Once | INTRAVENOUS | Status: AC
  Administered 2022-12-20: 4 mg via INTRAVENOUS
  Filled 2022-12-20: qty 1

## 2022-12-20 MED ORDER — SODIUM CHLORIDE 0.9 % IV SOLN
INTRAVENOUS | Status: AC | PRN
  Administered 2022-12-20: 4 ug/kg/min via INTRAVENOUS

## 2022-12-20 MED ORDER — NITROGLYCERIN IN D5W 200-5 MCG/ML-% IV SOLN
INTRAVENOUS | Status: AC | PRN
  Administered 2022-12-20: 10 ug/min via INTRAVENOUS

## 2022-12-20 MED ORDER — CANGRELOR BOLUS VIA INFUSION
INTRAVENOUS | Status: DC | PRN
  Administered 2022-12-20: 3306 ug via INTRAVENOUS

## 2022-12-20 MED ORDER — MIDAZOLAM HCL 2 MG/2ML IJ SOLN
INTRAMUSCULAR | Status: AC
  Filled 2022-12-20: qty 2

## 2022-12-20 MED ORDER — TICAGRELOR 90 MG PO TABS
90.0000 mg | ORAL_TABLET | Freq: Two times a day (BID) | ORAL | Status: DC
  Administered 2022-12-21 – 2022-12-24 (×7): 90 mg via ORAL
  Filled 2022-12-20 (×7): qty 1

## 2022-12-20 MED ORDER — FENTANYL CITRATE (PF) 100 MCG/2ML IJ SOLN
INTRAMUSCULAR | Status: DC | PRN
  Administered 2022-12-20: 12.5 ug via INTRAVENOUS

## 2022-12-20 MED ORDER — ASPIRIN 81 MG PO CHEW
243.0000 mg | CHEWABLE_TABLET | Freq: Once | ORAL | Status: AC
  Administered 2022-12-20: 243 mg via ORAL
  Filled 2022-12-20: qty 3

## 2022-12-20 MED ORDER — NITROGLYCERIN 1 MG/10 ML FOR IR/CATH LAB
INTRA_ARTERIAL | Status: AC
  Filled 2022-12-20: qty 10

## 2022-12-20 MED ORDER — ENOXAPARIN SODIUM 60 MG/0.6ML IJ SOSY
0.5000 mg/kg | PREFILLED_SYRINGE | INTRAMUSCULAR | Status: DC
  Administered 2022-12-21 – 2022-12-24 (×4): 55 mg via SUBCUTANEOUS
  Filled 2022-12-20 (×4): qty 0.6

## 2022-12-20 SURGICAL SUPPLY — 24 items
BALLN MINITREK RX 1.5X12 (BALLOONS) ×1
BALLN TREK RX 2.25X12 (BALLOONS) ×1
BALLN ~~LOC~~ TREK NEO RX 2.75X12 (BALLOONS) ×1
BALLN ~~LOC~~ TREK NEO RX 3.25X8 (BALLOONS) IMPLANT
BALLOON MINITREK RX 1.5X12 (BALLOONS) IMPLANT
BALLOON TREK RX 2.25X12 (BALLOONS) IMPLANT
BALLOON ~~LOC~~ TREK NEO RX 2.75X12 (BALLOONS) IMPLANT
CATH INFINITI JR4 5F (CATHETERS) IMPLANT
CATH LAUNCHER 6FR EBU 3 (CATHETERS) IMPLANT
CATH LAUNCHER 6FR EBU3.5 (CATHETERS) IMPLANT
DEVICE RAD COMP TR BAND LRG (VASCULAR PRODUCTS) IMPLANT
DRAPE BRACHIAL (DRAPES) IMPLANT
GLIDESHEATH SLEND SS 6F .021 (SHEATH) IMPLANT
GUIDEWIRE INQWIRE 1.5J.035X260 (WIRE) IMPLANT
INQWIRE 1.5J .035X260CM (WIRE) ×1
KIT ENCORE 26 ADVANTAGE (KITS) IMPLANT
PACK CARDIAC CATH (CUSTOM PROCEDURE TRAY) ×1 IMPLANT
PROTECTION STATION PRESSURIZED (MISCELLANEOUS) ×1
SET ATX-X65L (MISCELLANEOUS) IMPLANT
STATION PROTECTION PRESSURIZED (MISCELLANEOUS) IMPLANT
STENT ONYX FRONTIER 2.5X18 (Permanent Stent) IMPLANT
TUBING CIL FLEX 10 FLL-RA (TUBING) IMPLANT
WIRE G HI TQ BMW 190 (WIRE) IMPLANT
WIRE RUNTHROUGH .014X180CM (WIRE) IMPLANT

## 2022-12-20 NOTE — ED Notes (Signed)
Writer called pharmacy and requested heparin be dose in order to heparinize patient prior to going to cath lab.

## 2022-12-20 NOTE — ED Notes (Signed)
Called [facility] @2032  to initate CODE STEMI spoke to Re:Kim who will activate page

## 2022-12-20 NOTE — Progress Notes (Signed)
Responded to page. Family at bedside, staff in room-spoke to nurse no immediate needs at this time. Chaplain services available if needs arise

## 2022-12-20 NOTE — ED Notes (Signed)
Informed RN Alex via chat/ pt has bed assigned

## 2022-12-20 NOTE — ED Notes (Addendum)
Pt taken to cath lab. Pt awake and alert. Report given to cath lab personnel. Opportunity given to ask questions. Pt's family member left to ED lobby. All personal belongings with patient in cath lab.

## 2022-12-20 NOTE — ED Provider Notes (Signed)
[facility] Provider Note    Event Date/Time   First MD Initiated Contact with Patient 12/20/22 2019     (approximate)   History   Chief Complaint Allergic Reaction   HPI  JAYMEE TILSON is a 67 y.o. female with past medical history of hypertension, hyperlipidemia, CAD, COPD, GERD, and rheumatoid arthritis who presents to the ED complaining of allergic reaction.  Patient reports that she initially noticed her tongue swelling this afternoon around 4 PM, took 50 mg of oral Benadryl without improvement.  She felt like the tongue swelling increased over the course of the evening and she eventually decided to give herself IM epinephrine at 7:30 PM.  Tongue swelling continued to worsen and she developed pressure in the center of her chest after the dose of epinephrine.  She denies any difficulty breathing, has been feeling nauseous.  She reports similar episodes in the past, was previously taking losartan, but has not been taking this for a couple of months.     Physical Exam   Triage Vital Signs: ED Triage Vitals  Encounter Vitals Group     BP      Systolic BP Percentile      Diastolic BP Percentile      Pulse      Resp      Temp      Temp src      SpO2      Weight      Height      Head Circumference      Peak Flow      Pain Score      Pain Loc      Pain Education      Exclude from Growth Chart     Most recent vital signs: Vitals:   12/20/22 2045 12/20/22 2049  BP: (!) 153/82   Pulse: 99   Resp: 15   Temp:  97.8 F (36.6 C)  SpO2: 95%     Constitutional: Alert and oriented. Eyes: Conjunctivae are normal. Head: Atraumatic. Nose: No congestion/rhinnorhea. Mouth/Throat: Significant tongue angioedema bilaterally, posterior oropharynx appears clear with no angioedema. Cardiovascular: Normal rate, regular rhythm. Grossly normal heart sounds.  2+ radial pulses bilaterally. Respiratory: Normal respiratory effort.  No retractions. Lungs  CTAB. Gastrointestinal: Soft and nontender. No distention. Musculoskeletal: No lower extremity tenderness nor edema.  Neurologic:  Normal speech and language. No gross focal neurologic deficits are appreciated.    ED Results / Procedures / Treatments   Labs (all labs ordered are listed, but only abnormal results are displayed) Labs Reviewed  CBC WITH DIFFERENTIAL/PLATELET - Abnormal; Notable for the following components:      Result Value   WBC 11.5 (*)    Lymphs Abs 4.9 (*)    Eosinophils Absolute 0.7 (*)    All other components within normal limits  BASIC METABOLIC PANEL  TROPONIN I (HIGH SENSITIVITY)     EKG  ED ECG REPORT I, Chesley Noon, the attending physician, personally viewed and interpreted this ECG.   Date: 12/20/2022  EKG Time: 20:29  Rate: 107  Rhythm: sinus tachycardia, bigeminy  Axis: LAD  Intervals:none  ST&T Change: ST elevation laterally with ST depression inferiorly  RADIOLOGY Chest x-ray reviewed and interpreted by me with no infiltrate, edema, or effusion.  PROCEDURES:  Critical Care performed: Yes, see critical care procedure note(s)  .Critical Care  Performed by: Chesley Noon, MD Authorized by: Chesley Noon, MD   Critical care provider statement:    Critical  care time (minutes):  30   Critical care time was exclusive of:  Separately billable procedures and treating other patients and teaching time   Critical care was necessary to treat or prevent imminent or life-threatening deterioration of the following conditions: STEMI, angioedema.   Critical care was time spent personally by me on the following activities:  Development of treatment plan with patient or surrogate, discussions with consultants, evaluation of patient's response to treatment, examination of patient, ordering and review of laboratory studies, ordering and review of radiographic studies, ordering and performing treatments and interventions, pulse oximetry, re-evaluation  of patient's condition and review of old charts   I assumed direction of critical care for this patient from another provider in my specialty: no     Care discussed with: admitting provider      MEDICATIONS ORDERED IN ED: Medications  famotidine (PEPCID) IVPB 20 mg premix (20 mg Intravenous New Bag/Given 12/20/22 2034)  tranexamic acid (CYKLOKAPRON) IVPB 1,000 mg (has no administration in time range)  0.9 %  sodium chloride infusion (has no administration in time range)  C1 esterase inhibitor (Human) (BERINERT) injection 2,000 Units (has no administration in time range)  methylPREDNISolone sodium succinate (SOLU-MEDROL) 125 mg/2 mL injection 125 mg (125 mg Intravenous Given 12/20/22 2027)  diphenhydrAMINE (BENADRYL) injection 50 mg (50 mg Intravenous Given 12/20/22 2031)  ondansetron (ZOFRAN) injection 4 mg (4 mg Intravenous Given 12/20/22 2031)  aspirin chewable tablet 243 mg (243 mg Oral Given 12/20/22 2043)  morphine (PF) 4 MG/ML injection 4 mg (4 mg Intravenous Given 12/20/22 2052)     IMPRESSION / MDM / ASSESSMENT AND PLAN / ED COURSE  I reviewed the triage vital signs and the nursing notes.                              67 y.o. female with past medical history of hypertension, hyperlipidemia, CAD, COPD, GERD, and rheumatoid arthritis who presents to the ED complaining of increasing tongue swelling over the course of the afternoon, now with chest pressure after giving IM epinephrine about 30 minutes prior to arrival.  Patient's presentation is most consistent with acute presentation with potential threat to life or bodily function.  Differential diagnosis includes, but is not limited to, hereditary angioedema, medication induced angioedema, anaphylaxis, ACS, PE, pneumonia, pneumothorax.  Patient ill-appearing but in no acute distress, vital signs are unremarkable.  She has significant angioedema of her tongue but her posterior oropharynx appears to be unaffected.  She gave IM epinephrine  prior to arrival and we will treat with IV Solu-Medrol, Pepcid, and Benadryl.  She also has responded to TXA and C1 esterase inhibitor in the past, will treat with these again today.  She does report significant chest pressure and EKG concerning for STEMI with ST elevation laterally.  Code STEMI was activated and case discussed with Dr. Okey Dupre of cardiology, who is on the way to the ED and will plan to take patient to the Cath Lab.  On reassessment, patient reports her tongue swelling is improving.  Will have her lay flat for a little bit here in the ED to ensure she will tolerate the catheterization without intubation.  We will treat ongoing chest pain with IV morphine and start patient on heparin.  She took 1 baby aspirin prior to arrival and we will give 3 additional baby aspirin.  Patient able to tolerate lying flat with minimal difficulty and was taken to Cath Lab by  Dr. Okey Dupre, do not feel she requires intubation at this time.      FINAL CLINICAL IMPRESSION(S) / ED DIAGNOSES   Final diagnoses:  ST elevation myocardial infarction (STEMI), unspecified artery (HCC)  Angioedema, initial encounter     Rx / DC Orders   ED Discharge Orders     None        Note:  This document was prepared using Dragon voice recognition software and may include unintentional dictation errors.   Chesley Noon, MD 12/20/22 2115

## 2022-12-20 NOTE — Consult Note (Signed)
PHARMACY - ANTICOAGULATION CONSULT NOTE  Pharmacy Consult for Heparin Indication: chest pain/ACS  Allergies  Allergen Reactions   Estrogens Other (See Comments)    Blood clot   Losartan Swelling    Tongue swelling   Codeine Other (See Comments)    Migraine   Flexeril [Cyclobenzaprine] Hives   Penicillins Hives    Has patient had a PCN reaction causing immediate rash, facial/tongue/throat swelling, SOB or lightheadedness with hypotension: yes Has patient had a PCN reaction causing severe rash involving mucus membranes or skin necrosis: no Has patient had a PCN reaction that required hospitalization:  no Has patient had a PCN reaction occurring within the last 10 years: no If all of the above answers are "NO", then may proceed with Cephalosporin use.    Terfenadine Hives   Vicodin [Hydrocodone-Acetaminophen] Hives    Patient Measurements: Height: 5\' 4"  (162.6 cm) Weight: 110.2 kg (243 lb) IBW/kg (Calculated) : 54.7 Heparin Dosing Weight: 80.9 kg  Vital Signs: Temp: 97.8 F (36.6 C) (10/08 2049) Temp Source: Oral (10/08 2049) BP: 153/82 (10/08 2045) Pulse Rate: 99 (10/08 2045)  Labs: Recent Labs    12/20/22 2036  HGB 13.9  HCT 41.6  PLT 334  CREATININE 1.15*  TROPONINIHS 7    Estimated Creatinine Clearance: 56 mL/min (A) (by C-G formula based on SCr of 1.15 mg/dL (H)).   Medical History: Past Medical History:  Diagnosis Date   Anemia    Anginal pain (HCC)    Aortic atherosclerosis (HCC)    Asthma    B12 deficiency    Carpal tunnel syndrome    CHF (congestive heart failure) (HCC)    Chronic venous insufficiency    Complete tear of right rotator cuff    COPD (chronic obstructive pulmonary disease) (HCC)    Coronary artery disease 01/07/2005   a.) LHC 01/07/2005 (NSTEMI): LHC 60% pLAD, 70% D1, 30% pLCx, 20% pRCA, 25% dRCA - med mgmt; b.) LHC ([place]) 01/10/2008: 25/60% pLAD, 70% D1, 20% pLCx, 20% mRCA --> FFR normal --> med mgmt; c.) LHC ([place]) 01/04/2012::  40/60% pLAD, 70% D1, 25% pLCx, 20% pRCA, 20% mRCA, 25% dRCA --> FFR normal --> med mgmt; d.) LHC ([place]) 01/10/2018: 20% pRCA, 50% pLAD, 55% oD1 -- med mgmt   Diverticulosis    DOE (dyspnea on exertion)    GERD (gastroesophageal reflux disease)    History of bilateral cataract extraction 2014   Hyperlipidemia    Hypertension    Hypothyroidism    a.) s/p LEFT hemithyroidectomy; on levothyroxine   Long term current use of aspirin    Long term current use of immunosuppressive drug    a.) MTX + secukinumab for RA/psoriatric arthritis diagnoses   Lower extremity edema    Lymphedema    NSTEMI (non-ST elevated myocardial infarction) (HCC) 01/06/2005   a.) LHC 01/07/2005: 60% pLAD, 70% D1, 30% pLCx, 20% pRCA, 25% dRCA -- med mgmt   OSA on CPAP    a.) compliance issues with prescribed nocturnal PAP therapy   Osteoarthritis    Peripheral vascular disease (HCC)    Post-menopausal osteoporosis    Psoriatic arthritis (HCC)    RA (rheumatoid arthritis) (HCC)    Renal insufficiency    T2DM (type 2 diabetes mellitus) (HCC)    Thyroid cancer (HCC)    a.) s/p LEFT hemithyroidectomy 08/2015    Medications:  No history of chronic anticoagulant use PTA  Assessment: 67 y.o. female with past medical history of hypertension, hyperlipidemia, CAD, COPD, GERD, and rheumatoid arthritis who  presents to the ED complaining of allergic reaction. Patient reports that she initially noticed her tongue swelling this afternoon around 4 PM, took 50 mg of oral Benadryl without improvement. Cardiology was consulted d/t frequent PVCs and brief runs of NSVT. Pharmacy has been consulted for continuous heparin infusion dosing. Baseline labs have been ordered and are pending.  Goal of Therapy:  Heparin level 0.3-0.7 units/ml Monitor platelets by anticoagulation protocol: Yes   Plan:  Give 4000 units bolus x 1 Start heparin infusion at 1100 units/hr Check anti-Xa level in 6 hours and daily while on heparin Continue to  monitor H&H and platelets  Laden Fieldhouse A Glennys Schorsch 12/20/2022,9:44 PM

## 2022-12-20 NOTE — Consult Note (Signed)
Cardiology Consultation   Patient ID: VANNIA NASCIMENTO MRN: [id number]; DOB: December 06, 1955  Admit date: 12/20/2022 Date of Consult: 12/20/2022  PCP:  Patrice Paradise, MD    HeartCare Providers Cardiologist:  [facility]     Patient Profile:   Jacqueline Rhodes is a 67 y.o. female with a hx of coronary artery disease (she reports prior attempts at stent placement have been unsuccessful), recurrent angioedema of uncertain etiology, hypertension, hyperlipidemia, and thyroid disease, who is being seen 12/20/2022 for the evaluation of chest pain and elevated troponin at the request of Dr. Larinda Buttery.  History of Present Illness:   Ms. Hesterberg was in her usual state of health until approximately 6 PM when she began to notice swelling along the left side of her lip.  She took diphenhydramine without any improvement.  Given subsequent swelling of her tongue, she elected to administer her EpiPen and come to the ER.  While being brought to the emergency department, she began to feel like her heart was pounding in her chest with associated chest pain.  The pounding sensation has improved but she continues to have significant chest pain rating to the back accompanied by left arm numbness.  In the ED, EKG showed lateral ST elevation and frequent PVCs and brief runs of NSVT.  She received aspirin, heparin, morphine, methylprednisolone, famotidine, and diphenhydramine.  She also received ondansetron for nausea.  Tranexamic acid and C1 esterase inhibitor have been ordered but not yet administered.  At this time, the patient feels like her lip and tongue swelling has improved.  She feels like it is a little hard to swallow but she is not short of breath or wheezy.   Past Medical History:  Diagnosis Date   Anemia    Anginal pain (HCC)    Aortic atherosclerosis (HCC)    Asthma    B12 deficiency    Carpal tunnel syndrome    CHF (congestive heart failure) (HCC)    Chronic venous  insufficiency    Complete tear of right rotator cuff    COPD (chronic obstructive pulmonary disease) (HCC)    Coronary artery disease 01/07/2005   a.) LHC 01/07/2005 (NSTEMI): LHC 60% pLAD, 70% D1, 30% pLCx, 20% pRCA, 25% dRCA - med mgmt; b.) LHC ([place]) 01/10/2008: 25/60% pLAD, 70% D1, 20% pLCx, 20% mRCA --> FFR normal --> med mgmt; c.) LHC ([place]) 01/04/2012:: 40/60% pLAD, 70% D1, 25% pLCx, 20% pRCA, 20% mRCA, 25% dRCA --> FFR normal --> med mgmt; d.) LHC ([place]) 01/10/2018: 20% pRCA, 50% pLAD, 55% oD1 -- med mgmt   Diverticulosis    DOE (dyspnea on exertion)    GERD (gastroesophageal reflux disease)    History of bilateral cataract extraction 2014   Hyperlipidemia    Hypertension    Hypothyroidism    a.) s/p LEFT hemithyroidectomy; on levothyroxine   Long term current use of aspirin    Long term current use of immunosuppressive drug    a.) MTX + secukinumab for RA/psoriatric arthritis diagnoses   Lower extremity edema    Lymphedema    NSTEMI (non-ST elevated myocardial infarction) (HCC) 01/06/2005   a.) LHC 01/07/2005: 60% pLAD, 70% D1, 30% pLCx, 20% pRCA, 25% dRCA -- med mgmt   OSA on CPAP    a.) compliance issues with prescribed nocturnal PAP therapy   Osteoarthritis    Peripheral vascular disease (HCC)    Post-menopausal osteoporosis    Psoriatic arthritis (HCC)    RA (rheumatoid arthritis) (HCC)  Renal insufficiency    T2DM (type 2 diabetes mellitus) (HCC)    Thyroid cancer (HCC)    a.) s/p LEFT hemithyroidectomy 08/2015    Past Surgical History:  Procedure Laterality Date   ABDOMINAL HYSTERECTOMY  2003   BIOPSY THYROID  2017   x 2   BREAST BIOPSY Left 2000   benign   BREAST BIOPSY Right 2015   benign   CATARACT EXTRACTION, BILATERAL  2014   left ankle surgery  2001   LEFT HEART CATH AND CORONARY ANGIOGRAPHY Left 01/10/2018   Procedure: LEFT HEART CATH AND CORONARY ANGIOGRAPHY;  Surgeon: Lamar Blinks, MD;  Location: [facility] INVASIVE CV [facility];  Service: Cardiovascular;   Laterality: Left;   LEFT HEART CATH AND CORONARY ANGIOGRAPHY Left 01/07/2005   Procedure: LEFT HEART CATH AND CORONARY ANGIOGRAPHY; Location: [facility]; Surgeon: Arnoldo Hooker, MD   LEFT HEART CATH AND CORONARY ANGIOGRAPHY Left 01/10/2008   Procedure: LEFT HEART CATH AND CORONARY ANGIOGRAPHY; Location: [facility]; Surgeon: Arnoldo Hooker, MD / Rudean Hitt, MD   LEFT HEART CATH AND CORONARY ANGIOGRAPHY Left 01/04/2012   Procedure: LEFT HEART CATH AND CORONARY ANGIOGRAPHY; Location: [facility]; Surgeon: Arnoldo Hooker, MD / Marcina Millard, MD   left shoulder surgery  1996   right elbow surgery  1999   THYROIDECTOMY, PARTIAL Left 09/05/2015   Procedure: LEFT HEMITHYROIDECTOMY   TOTAL KNEE ARTHROPLASTY Right 05/29/2018   Procedure: TOTAL KNEE ARTHROPLASTY-RIGHT;  Surgeon: Kennedy Bucker, MD;  Location: [facility] ORS;  Service: Orthopedics;  Laterality: Right;   VEIN SURGERY Bilateral 2015   legs     Home Medications:  Prior to Admission medications   Medication Sig Start Date Tehya Leath Date Taking? Authorizing Provider  acetaminophen (TYLENOL) 500 MG tablet Take 2 tablets (1,000 mg total) by mouth every 8 (eight) hours. 08/02/22 08/02/23  Signa Kell, MD  albuterol (PROVENTIL HFA;VENTOLIN HFA) 108 (90 Base) MCG/ACT inhaler Inhale 2 puffs into the lungs every 6 (six) hours as needed for wheezing or shortness of breath.     [provider]  Calcium Fructoborate POWD 20 mcg by Does not apply route. Cream    [provider]  Cetirizine HCl 10 MG CAPS Take 10 mg by mouth daily.     [provider]  Cholecalciferol 25 MCG (1000 UT) TBDP Take 125 mcg by mouth.    [provider]  Coenzyme Q10 (COQ-10) 100 MG CAPS Take 100 mg by mouth daily.     [provider]  diphenhydrAMINE (BENADRYL) 50 MG tablet Take 0.5 tablets (25 mg total) by mouth every 8 (eight) hours for 3 days. 11/12/22 11/15/22  Lucile Shutters, MD  famotidine (PEPCID) 20 MG tablet Take 1 tablet (20 mg total) by  mouth 2 (two) times daily for 3 days. 11/12/22 11/15/22  Agbata, Tochukwu, MD  fluticasone furoate-vilanterol (BREO ELLIPTA) 100-25 MCG/INH AEPB Inhale 1 puff into the lungs daily.  01/27/17   [provider]  folic acid (FOLVITE) 1 MG tablet Take 1 mg by mouth every other day.  11/10/16   [provider]  furosemide (LASIX) 20 MG tablet Take 20 mg by mouth daily as needed for fluid or edema.    [provider]  hydrochlorothiazide (HYDRODIURIL) 25 MG tablet Take 25 mg by mouth daily.  08/23/16   [provider]  isosorbide mononitrate (IMDUR) 60 MG 24 hr tablet Take 60 mg by mouth daily.  08/23/16   [provider]  levothyroxine (SYNTHROID) 150 MCG tablet Take 150 mcg by mouth daily before breakfast.  [provider]  meclizine (ANTIVERT) 25 MG tablet Take 25 mg by mouth 3 (three) times daily as needed for dizziness.    [provider]  methotrexate (RHEUMATREX) 2.5 MG tablet Take 20 mg by mouth every Thursday.  09/06/16   [provider]  pantoprazole (PROTONIX) 40 MG tablet Take 40 mg by mouth 2 (two) times daily.    [provider]  Secukinumab (COSENTYX SENSOREADY 300 DOSE) 150 MG/ML SOAJ Inject 300 mg into the skin every 28 (twenty-eight) days.  07/07/17   [provider]    Inpatient Medications: Scheduled Meds:  C1 esterase inhibitor (Human)  2,000 Units Intravenous Once   Continuous Infusions:  sodium chloride     heparin     tranexamic acid     PRN Meds:   Allergies:    Allergies  Allergen Reactions   Estrogens Other (See Comments)    Blood clot   Losartan Swelling    Tongue swelling   Codeine Other (See Comments)    Migraine   Flexeril [Cyclobenzaprine] Hives   Penicillins Hives    Has patient had a PCN reaction causing immediate rash, facial/tongue/throat swelling, SOB or lightheadedness with hypotension: yes Has patient had a PCN reaction causing severe rash involving mucus membranes  or skin necrosis: no Has patient had a PCN reaction that required hospitalization:  no Has patient had a PCN reaction occurring within the last 10 years: no If all of the above answers are "NO", then may proceed with Cephalosporin use.    Terfenadine Hives   Vicodin [Hydrocodone-Acetaminophen] Hives    Social History:   Social History   Tobacco Use   Smoking status: Former    Current packs/day: 0.00    Average packs/day: 0.5 packs/day for 30.0 years (15.0 ttl pk-yrs)    Types: Cigarettes    Start date: 1    Quit date: 2005    Years since quitting: 19.7   Smokeless tobacco: Never  Vaping Use   Vaping status: Never Used  Substance Use Topics   Alcohol use: No   Drug use: No      Family History:   Family History  Problem Relation Age of Onset   Breast cancer Cousin        paternal side   Breast cancer Paternal Aunt        2 PATs   Breast cancer Maternal Aunt    Kidney failure Sister    Diabetes Sister    Diabetes Mother    Kidney cancer Neg Hx    Bladder Cancer Neg Hx      ROS:  Unable obtain due to acuity of illness.  Physical Exam/Data:   Vitals:   12/20/22 2043 12/20/22 2045 12/20/22 2049 12/20/22 2051  BP:  (!) 153/82    Pulse: 79 99    Resp: 15 15    Temp:   97.8 F (36.6 C)   TempSrc:   Oral   SpO2: 94% 95%    Weight:    110.2 kg  Height:    5\' 4"  (1.626 m)    Intake/Output Summary (Last 24 hours) at 12/20/2022 2116 Last data filed at 12/20/2022 2107 Gross per 24 hour  Intake 50 ml  Output --  Net 50 ml      12/20/2022    8:51 PM 11/11/2022    5:02 AM 09/28/2022   10:32 AM  Last 3 Weights  Weight (lbs) 243 lb 245 lb 244 lb 11.4 oz  Weight (kg) 110.224  kg 111.131 kg 111 kg     Body mass index is 41.71 kg/m.  General: Obese, anxious appearing woman lying on stretcher.  Her sister is at the bedside. HEENT: No significant lip swelling appreciated.  Tongue appears swollen.  No significant edema noted in the oropharynx. Neck: No gross JVD,  though evaluation is limited by body habitus. Vascular: 2+ radial pulses bilaterally. Cardiac: Rate and rhythm without murmurs, rubs, or gallops. Lungs: Good air movement without crackles.  Scattered rhonchi noted.  No wheezes. Abd: soft, nontender, no hepatomegaly  Ext: 1 pretibial edema bilaterally (patient reports this is chronic and at her baseline). Musculoskeletal:  No deformities, BUE and BLE strength normal and equal Skin: warm and dry  Neuro:  CNs 2-12 intact, no focal abnormalities noted Psych:  Normal affect   EKG:  The EKG was personally reviewed and demonstrates: Normal sinus rhythm with frequent PVCs and lateral ST elevation. Telemetry:  Telemetry was personally reviewed and demonstrates: Normal sinus rhythm with PVCs and brief NSVT.  Relevant CV Studies:  Laboratory Data:  High Sensitivity Troponin:  No results for input(s): "TROPONINIHS" in the last 720 hours.   ChemistryNo results for input(s): "NA", "K", "CL", "CO2", "GLUCOSE", "BUN", "CREATININE", "CALCIUM", "MG", "GFRNONAA", "GFRAA", "ANIONGAP" in the last 168 hours.  No results for input(s): "PROT", "ALBUMIN", "AST", "ALT", "ALKPHOS", "BILITOT" in the last 168 hours. Lipids No results for input(s): "CHOL", "TRIG", "HDL", "LABVLDL", "LDLCALC", "CHOLHDL" in the last 168 hours.  Hematology Recent Labs  Lab 12/20/22 2036  WBC 11.5*  RBC 4.72  HGB 13.9  HCT 41.6  MCV 88.1  MCH 29.4  MCHC 33.4  RDW 13.5  PLT 334   Thyroid No results for input(s): "TSH", "FREET4" in the last 168 hours.  BNPNo results for input(s): "BNP", "PROBNP" in the last 168 hours.  DDimer No results for input(s): "DDIMER" in the last 168 hours.   Radiology/Studies:  DG Chest Portable 1 View  Result Date: 12/20/2022 CLINICAL DATA:  Chest pain. EXAM: PORTABLE CHEST 1 VIEW COMPARISON:  CT 05/25/2016 FINDINGS: The heart is borderline enlarged. Aortic atherosclerosis. Peribronchial thickening without focal airspace disease. No large pleural  effusion or pneumothorax. No acute osseous findings. IMPRESSION: 1. Peribronchial thickening without focal airspace disease. 2. Borderline cardiomegaly. Electronically Signed   By: Narda Rutherford M.D.   On: 12/20/2022 20:50     Assessment and Plan:   STEMI: Patient initially developed lip and tongue swelling this evening, prompting her to take diphenhydramine and EpiPen.  Shortly after taking EpiPen, she developed crushing chest pain with EKG demonstrating lateral ST elevation and frequent PVCs.  I suspect she may have had an acute plaque rupture in the setting of epinephrine administration leading to STEMI.  I have commended proceeding with cardiac catheterization with possible PCI, which Ms. Cabral is in agreement with.  She is already received aspirin and heparin.  Further recommendations to follow catheterization.  Angioedema: Etiology remains unclear.  Episodes have recurred despite avoidance of losartan.  Ongoing management per ED and internal medicine.  Code status: Patient confirms that she wishes to be full-code.  The patient follows as an outpatient with [facility], going cardiology care will be transitioned to [facility] in the morning.  For questions or updates, please contact [facility] Please consult [email or web address] for contact info under [facility].  Signed, Yvonne Kendall, MD  12/20/2022 9:16 PM

## 2022-12-20 NOTE — ED Notes (Signed)
Writer called pharmacy and requested TXA and C1 esterase inhibitor be tubed to main ED ASAP.

## 2022-12-20 NOTE — Progress Notes (Signed)
Anticoagulation monitoring(Lovenox):  67 yo female ordered Lovenox 40 mg Q24h    Filed Weights   12/20/22 2051  Weight: 110.2 kg (243 lb)   BMI 41.7   Lab Results  Component Value Date   CREATININE 1.15 (H) 12/20/2022   CREATININE 0.76 11/12/2022   CREATININE 0.89 11/11/2022   Estimated Creatinine Clearance: 56 mL/min (A) (by C-G formula based on SCr of 1.15 mg/dL (H)). Hemoglobin & Hematocrit     Component Value Date/Time   HGB 13.9 12/20/2022 2036   HGB 12.1 06/24/2011 1054   HCT 41.6 12/20/2022 2036     Per Protocol for Patient with estCrcl > 30 ml/min and BMI > 30, will transition to Lovenox 55 mg Q24h.

## 2022-12-21 ENCOUNTER — Encounter: Payer: Self-pay | Admitting: Internal Medicine

## 2022-12-21 ENCOUNTER — Other Ambulatory Visit: Payer: Self-pay

## 2022-12-21 DIAGNOSIS — T783XXS Angioneurotic edema, sequela: Secondary | ICD-10-CM | POA: Diagnosis not present

## 2022-12-21 DIAGNOSIS — J4520 Mild intermittent asthma, uncomplicated: Secondary | ICD-10-CM | POA: Diagnosis not present

## 2022-12-21 DIAGNOSIS — I2102 ST elevation (STEMI) myocardial infarction involving left anterior descending coronary artery: Secondary | ICD-10-CM | POA: Diagnosis not present

## 2022-12-21 DIAGNOSIS — K219 Gastro-esophageal reflux disease without esophagitis: Secondary | ICD-10-CM | POA: Insufficient documentation

## 2022-12-21 DIAGNOSIS — E039 Hypothyroidism, unspecified: Secondary | ICD-10-CM | POA: Diagnosis not present

## 2022-12-21 LAB — BASIC METABOLIC PANEL
Anion gap: 13 (ref 5–15)
BUN: 23 mg/dL (ref 8–23)
CO2: 23 mmol/L (ref 22–32)
Calcium: 8.8 mg/dL — ABNORMAL LOW (ref 8.9–10.3)
Chloride: 98 mmol/L (ref 98–111)
Creatinine, Ser: 1.06 mg/dL — ABNORMAL HIGH (ref 0.44–1.00)
GFR, Estimated: 57 mL/min — ABNORMAL LOW (ref >60)
Glucose, Bld: 245 mg/dL — ABNORMAL HIGH (ref 70–99)
Potassium: 3.4 mmol/L — ABNORMAL LOW (ref 3.5–5.1)
Sodium: 134 mmol/L — ABNORMAL LOW (ref 135–145)

## 2022-12-21 LAB — LIPID PANEL
Cholesterol: 170 mg/dL (ref 0–200)
HDL: 50 mg/dL (ref >40)
LDL Cholesterol: 108 mg/dL — ABNORMAL HIGH (ref 0–99)
Total CHOL/HDL Ratio: 3.4 {ratio}
Triglycerides: 60 mg/dL (ref <150)
VLDL: 12 mg/dL (ref 0–40)

## 2022-12-21 LAB — TROPONIN I (HIGH SENSITIVITY)
Troponin I (High Sensitivity): 1220 ng/L (ref <18)
Troponin I (High Sensitivity): 18592 ng/L (ref <18)

## 2022-12-21 LAB — CBC
HCT: 38.6 % (ref 36.0–46.0)
Hemoglobin: 13.2 g/dL (ref 12.0–15.0)
MCH: 29.4 pg (ref 26.0–34.0)
MCHC: 34.2 g/dL (ref 30.0–36.0)
MCV: 86 fL (ref 80.0–100.0)
Platelets: 273 10*3/uL (ref 150–400)
RBC: 4.49 MIL/uL (ref 3.87–5.11)
RDW: 13.4 % (ref 11.5–15.5)
WBC: 9.2 10*3/uL (ref 4.0–10.5)
nRBC: 0 % (ref 0.0–0.2)

## 2022-12-21 LAB — MRSA NEXT GEN BY PCR, NASAL: MRSA by PCR Next Gen: NOT DETECTED

## 2022-12-21 MED ORDER — METOPROLOL TARTRATE 25 MG PO TABS
25.0000 mg | ORAL_TABLET | Freq: Two times a day (BID) | ORAL | Status: DC
  Administered 2022-12-21 – 2022-12-22 (×3): 25 mg via ORAL
  Filled 2022-12-21 (×4): qty 1

## 2022-12-21 MED ORDER — NITROGLYCERIN 2 % TD OINT
0.5000 [in_us] | TOPICAL_OINTMENT | Freq: Four times a day (QID) | TRANSDERMAL | Status: DC
  Administered 2022-12-21 (×2): 0.5 [in_us] via TOPICAL
  Filled 2022-12-21 (×2): qty 1

## 2022-12-21 MED ORDER — ISOSORBIDE MONONITRATE ER 30 MG PO TB24
60.0000 mg | ORAL_TABLET | Freq: Every day | ORAL | Status: DC
  Administered 2022-12-21 – 2022-12-22 (×2): 60 mg via ORAL
  Filled 2022-12-21 (×2): qty 2

## 2022-12-21 MED ORDER — EZETIMIBE 10 MG PO TABS
10.0000 mg | ORAL_TABLET | Freq: Every day | ORAL | Status: DC
  Administered 2022-12-21 – 2022-12-23 (×3): 10 mg via ORAL
  Filled 2022-12-21 (×3): qty 1

## 2022-12-21 MED ORDER — CHLORHEXIDINE GLUCONATE CLOTH 2 % EX PADS
6.0000 | MEDICATED_PAD | Freq: Every day | CUTANEOUS | Status: DC
  Administered 2022-12-21 – 2022-12-23 (×3): 6 via TOPICAL

## 2022-12-21 MED ORDER — PRAVASTATIN SODIUM 20 MG PO TABS
20.0000 mg | ORAL_TABLET | Freq: Every day | ORAL | Status: DC
  Administered 2022-12-21 – 2022-12-23 (×3): 20 mg via ORAL
  Filled 2022-12-21 (×3): qty 1

## 2022-12-21 MED ORDER — ROSUVASTATIN CALCIUM 10 MG PO TABS
40.0000 mg | ORAL_TABLET | Freq: Every day | ORAL | Status: DC
  Filled 2022-12-21: qty 4

## 2022-12-21 NOTE — Assessment & Plan Note (Addendum)
-   The patient was initially given 125 mg of IV Solu-Medrol, IV Pepcid and Benadryl. -C1 esterase inhibitor normal range.  Could be repeated episodes of angioedema secondary to being on losartan on July 17.  Sometimes we can get reactions up to 12 weeks after coming off the medication. -Has appointment with an allergist in November. -Advised taking Zyrtec and Allegra upon discharge and hold it a week before appointment with allergist. -Prednisone 10 mg daily.  Will give a tapering dose of prednisone upon going home.

## 2022-12-21 NOTE — Plan of Care (Signed)

## 2022-12-21 NOTE — Hospital Course (Addendum)
67 year old female past medical history of COPD, CAD, GERD, hypertension, dyslipidemia, hypothyroidism, obstructive sleep apnea on CPAP, type 2 diabetes mellitus and peripheral vascular disease.  She presented with palpitations and a feeling of her heart jumping out of her chest.  She has been having episodes of angioedema on July 17 thought to be secondary to losartan that began on August 30 and again on August 8.  She took some Benadryl and EpiPen yesterday secondary to lip and tongue swelling.  She was admitted with a STEMI and had a stent placed in the LAD and angioplasty of a diagonal.  10/9.  Patient's lip and tongue swelling improved.  She does have an appointment with an allergist in early November.  Will send off a C1 esterase inhibitor.  Advise she can take Allegra and/or Zyrtec at home to try to prevent this. 10/10.  With the EF being on the lower side spironolactone was added to see if blood pressure pulse tolerate. 10/11.  Patient had chest pain overnight.  Troponin trending down.  Seen by cardiology.  Patient ambulated in the hallway in the afternoon and did feel little short of breath and chest pain and will watch overnight again. 10/12.  No further chest pain.  Felt okay walking back and forth to the bathroom.  Feels ready to go home today.

## 2022-12-21 NOTE — Assessment & Plan Note (Addendum)
Continue Synthroid °

## 2022-12-21 NOTE — Assessment & Plan Note (Addendum)
Continue her albuterol inhaler.

## 2022-12-21 NOTE — Assessment & Plan Note (Addendum)
methotrexate and Cosentyx.  No current flare.

## 2022-12-21 NOTE — H&P (Signed)
[facility]   PATIENT NAME: Jacqueline Rhodes    MR#:  [id number]  DATE OF BIRTH:  Dec 22, 1955  DATE OF ADMISSION:  12/20/2022  PRIMARY CARE PHYSICIAN: Patrice Paradise, MD   Patient is coming from: Home  REQUESTING/REFERRING PHYSICIAN: End, Cristal Deer, MD  CHIEF COMPLAINT:   Chief Complaint  Patient presents with   Allergic Reaction    HISTORY OF PRESENT ILLNESS:  KENLEIGH ASHWELL is a 67 y.o. female with medical history significant for COPD, coronary artery disease, GERD, hypertension, dyslipidemia, hypothyroidism, OSA on CPAP, type 2 diabetes mellitus and peripheral vascular disease as well as asthma, who presented to the emergency room with acute onset of significant palpitations with a feeling of her heart jumping out of her chest.  Prior to that she was having lips and tongue swelling for a couple of episodes that she thought is related to her angioedema and took 2 Benadryl around 6 PM.  During the second episode she took her EpiPen around 7:30 PM before she started having her severe palpitations.  In the ER she started experiencing stabbing chest pain in the midsternal area with associated nausea and vomiting once as well as dyspnea without diaphoresis.  She has been having minimal cough that is dry.  No abdominal pain or melena or bright red being per rectum.  She denies any bilious vomitus or hematemesis.  No diarrhea.  No other bleeding diathesis.  No fever or chills.  ED Course: When she came to the ER, BP was 153/82 with a heart rate of 107 and respiratory to of 23 with otherwise normal vital signs.  Labs revealed hypokalemia of 2.7 and blood glucose of 188 and otherwise unremarkable CMP.  CBC showed leukocytosis 11.5 and high sensitive troponin I was 7. EKG as reviewed by me: EKG showed sinus tachycardia with rate 117 with ventricular bigeminy and suspected acute ST segment elevation lateral MI.  Another EKG confirmed it. Imaging: Portable chest x-ray showed  peribronchial thickening without focal airspace disease and borderline cardiomegaly.  Severe single-vessel coronary artery disease with thrombotic occlusion of the mid LAD involving moderate-caliber D2 branch.  There is mild, nonobstructive CAD involving the LCx and RCA. Moderately to severely reduced left ventricular systolic function (LVEF 30-35%) with mid and apical anterior akinesis. Moderately elevated left ventricular filling pressure (LVEDP 25 mmHg). Successful PCI to mid LAD using Onyx Frontier 2.5 x 18 mm drug-eluting stent with 0% residual stenosis and TIMI-3 flow.  Jailed diagonal branch was also angioplastied with improvement in ostial stenosis from 70%->50% and reestablishment of TIMI I flow in the mid/distal vessel  The patient was admitted to the ICU for further evaluation and management. PAST MEDICAL HISTORY:   Past Medical History:  Diagnosis Date   Anemia    Anginal pain (HCC)    Aortic atherosclerosis (HCC)    Asthma    B12 deficiency    Carpal tunnel syndrome    CHF (congestive heart failure) (HCC)    Chronic venous insufficiency    Complete tear of right rotator cuff    COPD (chronic obstructive pulmonary disease) (HCC)    Coronary artery disease 01/07/2005   a.) LHC 01/07/2005 (NSTEMI): LHC 60% pLAD, 70% D1, 30% pLCx, 20% pRCA, 25% dRCA - med mgmt; b.) LHC ([place]) 01/10/2008: 25/60% pLAD, 70% D1, 20% pLCx, 20% mRCA --> FFR normal --> med mgmt; c.) LHC ([place]) 01/04/2012:: 40/60% pLAD, 70% D1, 25% pLCx, 20% pRCA, 20% mRCA, 25% dRCA --> FFR normal --> med  mgmt; d.) LHC ([place]) 01/10/2018: 20% pRCA, 50% pLAD, 55% oD1 -- med mgmt   Diverticulosis    DOE (dyspnea on exertion)    GERD (gastroesophageal reflux disease)    History of bilateral cataract extraction 2014   Hyperlipidemia    Hypertension    Hypothyroidism    a.) s/p LEFT hemithyroidectomy; on levothyroxine   Long term current use of aspirin    Long term current use of immunosuppressive drug    a.) MTX +  secukinumab for RA/psoriatric arthritis diagnoses   Lower extremity edema    Lymphedema    NSTEMI (non-ST elevated myocardial infarction) (HCC) 01/06/2005   a.) LHC 01/07/2005: 60% pLAD, 70% D1, 30% pLCx, 20% pRCA, 25% dRCA -- med mgmt   OSA on CPAP    a.) compliance issues with prescribed nocturnal PAP therapy   Osteoarthritis    Peripheral vascular disease (HCC)    Post-menopausal osteoporosis    Psoriatic arthritis (HCC)    RA (rheumatoid arthritis) (HCC)    Renal insufficiency    T2DM (type 2 diabetes mellitus) (HCC)    Thyroid cancer (HCC)    a.) s/p LEFT hemithyroidectomy 08/2015    PAST SURGICAL HISTORY:   Past Surgical History:  Procedure Laterality Date   ABDOMINAL HYSTERECTOMY  2003   BIOPSY THYROID  2017   x 2   BREAST BIOPSY Left 2000   benign   BREAST BIOPSY Right 2015   benign   CATARACT EXTRACTION, BILATERAL  2014   left ankle surgery  2001   LEFT HEART CATH AND CORONARY ANGIOGRAPHY Left 01/10/2018   Procedure: LEFT HEART CATH AND CORONARY ANGIOGRAPHY;  Surgeon: Lamar Blinks, MD;  Location: [facility];  Service: Cardiovascular;  Laterality: Left;   LEFT HEART CATH AND CORONARY ANGIOGRAPHY Left 01/07/2005   Procedure: LEFT HEART CATH AND CORONARY ANGIOGRAPHY; Location: [facility]; Surgeon: Arnoldo Hooker, MD   LEFT HEART CATH AND CORONARY ANGIOGRAPHY Left 01/10/2008   Procedure: LEFT HEART CATH AND CORONARY ANGIOGRAPHY; Location: [facility]; Surgeon: Arnoldo Hooker, MD / Rudean Hitt, MD   LEFT HEART CATH AND CORONARY ANGIOGRAPHY Left 01/04/2012   Procedure: LEFT HEART CATH AND CORONARY ANGIOGRAPHY; Location: [facility]; Surgeon: Arnoldo Hooker, MD / Marcina Millard, MD   left shoulder surgery  1996   right elbow surgery  1999   THYROIDECTOMY, PARTIAL Left 09/05/2015   Procedure: LEFT HEMITHYROIDECTOMY   TOTAL KNEE ARTHROPLASTY Right 05/29/2018   Procedure: TOTAL KNEE ARTHROPLASTY-RIGHT;  Surgeon: Kennedy Bucker, MD;  Location: [facility] OR[facility];  Service:  Orthopedics;  Laterality: Right;   VEIN SURGERY Bilateral 2015   legs    SOCIAL HISTORY:   Social History   Tobacco Use   Smoking status: Former    Current packs/day: 0.00    Average packs/day: 0.5 packs/day for 30.0 years (15.0 ttl pk-yrs)    Types: Cigarettes    Start date: 55    Quit date: 2005    Years since quitting: 19.7   Smokeless tobacco: Never  Substance Use Topics   Alcohol use: No    FAMILY HISTORY:   Family History  Problem Relation Age of Onset   Breast cancer Cousin        paternal side   Breast cancer Paternal Aunt        2 PATs   Breast cancer Maternal Aunt    Kidney failure Sister    Diabetes Sister    Diabetes Mother    Kidney cancer Neg Hx    Bladder Cancer Neg Hx  DRUG ALLERGIES:   Allergies  Allergen Reactions   Estrogens Other (See Comments)    Blood clot   Losartan Swelling    Tongue swelling   Codeine Other (See Comments)    Migraine   Flexeril [Cyclobenzaprine] Hives   Penicillins Hives    Has patient had a PCN reaction causing immediate rash, facial/tongue/throat swelling, SOB or lightheadedness with hypotension: yes Has patient had a PCN reaction causing severe rash involving mucus membranes or skin necrosis: no Has patient had a PCN reaction that required hospitalization:  no Has patient had a PCN reaction occurring within the last 10 years: no If all of the above answers are "NO", then may proceed with Cephalosporin use.    Terfenadine Hives   Vicodin [Hydrocodone-Acetaminophen] Hives    REVIEW OF SYSTEMS:   ROS As per history of present illness. All pertinent systems were reviewed above. Constitutional, HEENT, cardiovascular, respiratory, GI, GU, musculoskeletal, neuro, psychiatric, endocrine, integumentary and hematologic systems were reviewed and are otherwise negative/unremarkable except for positive findings mentioned above in the HPI.   MEDICATIONS AT HOME:   Prior to Admission medications   Medication Sig  Start Date End Date Taking? Authorizing Provider  acetaminophen (TYLENOL) 500 MG tablet Take 2 tablets (1,000 mg total) by mouth every 8 (eight) hours. 08/02/22 08/02/23  Signa Kell, MD  albuterol (PROVENTIL HFA;VENTOLIN HFA) 108 (90 Base) MCG/ACT inhaler Inhale 2 puffs into the lungs every 6 (six) hours as needed for wheezing or shortness of breath.     [provider]  Calcium Fructoborate POWD 20 mcg by Does not apply route. Cream    [provider]  Cetirizine HCl 10 MG CAPS Take 10 mg by mouth daily.     [provider]  Cholecalciferol 25 MCG (1000 UT) TBDP Take 125 mcg by mouth.    [provider]  Coenzyme Q10 (COQ-10) 100 MG CAPS Take 100 mg by mouth daily.     [provider]  diphenhydrAMINE (BENADRYL) 50 MG tablet Take 0.5 tablets (25 mg total) by mouth every 8 (eight) hours for 3 days. 11/12/22 11/15/22  Lucile Shutters, MD  famotidine (PEPCID) 20 MG tablet Take 1 tablet (20 mg total) by mouth 2 (two) times daily for 3 days. 11/12/22 11/15/22  Agbata, Tochukwu, MD  fluticasone furoate-vilanterol (BREO ELLIPTA) 100-25 MCG/INH AEPB Inhale 1 puff into the lungs daily.  01/27/17   [provider]  folic acid (FOLVITE) 1 MG tablet Take 1 mg by mouth every other day.  11/10/16   [provider]  furosemide (LASIX) 20 MG tablet Take 20 mg by mouth daily as needed for fluid or edema.    [provider]  hydrochlorothiazide (HYDRODIURIL) 25 MG tablet Take 25 mg by mouth daily.  08/23/16   [provider]  isosorbide mononitrate (IMDUR) 60 MG 24 hr tablet Take 60 mg by mouth daily.  08/23/16   [provider]  levothyroxine (SYNTHROID) 150 MCG tablet Take 150 mcg by mouth daily before breakfast.    [provider]  meclizine (ANTIVERT) 25 MG tablet Take 25 mg by mouth 3 (three) times daily as needed for dizziness.    [provider]  methotrexate (RHEUMATREX) 2.5 MG tablet Take 20 mg by mouth every  Thursday.  09/06/16   [provider]  pantoprazole (PROTONIX) 40 MG tablet Take 40 mg by mouth 2 (two) times daily.    [provider]  Secukinumab (COSENTYX SENSOREADY 300 DOSE) 150 MG/ML SOAJ Inject 300 mg  into the skin every 28 (twenty-eight) days.  07/07/17   [provider]      VITAL SIGNS:  Blood pressure 130/84, pulse 98, temperature 97.8 F (36.6 C), temperature source Oral, resp. rate (!) 26, height 5\' 4"  (1.626 m), weight 110.2 kg, SpO2 97%.  PHYSICAL EXAMINATION:  Physical Exam  GENERAL:  67 y.o.-year-old Caucasian female patient lying in the bed with no acute distress.  EYES: Pupils equal, round, reactive to light and accommodation. No scleral icterus. Extraocular muscles intact.  HEENT: Head atraumatic, normocephalic. Oropharynx with residual tongue swelling and nasopharynx clear.  NECK:  Supple, no jugular venous distention. No thyroid enlargement, no tenderness.  LUNGS: Normal breath sounds bilaterally, no wheezing, rales,rhonchi or crepitation. No use of accessory muscles of respiration.  CARDIOVASCULAR: Regular rate and rhythm, S1, S2 normal. No murmurs, rubs, or gallops.  ABDOMEN: Soft, nondistended, nontender. Bowel sounds present. No organomegaly or mass.  EXTREMITIES: No pedal edema, cyanosis, or clubbing.  NEUROLOGIC: Cranial nerves II through XII are intact. Muscle strength 5/5 in all extremities. Sensation intact. Gait not checked.  PSYCHIATRIC: The patient is alert and oriented x 3.  Normal affect and good eye contact. SKIN: No obvious rash, lesion, or ulcer.   LABORATORY PANEL:   CBC Recent Labs  Lab 12/20/22 2036  WBC 11.5*  HGB 13.9  HCT 41.6  PLT 334   ------------------------------------------------------------------------------------------------------------------  Chemistries  Recent Labs  Lab 12/20/22 2036  NA 136  K 2.7*  CL 100  CO2 26  GLUCOSE 188*  BUN 22  CREATININE 1.15*  CALCIUM 9.1  AST 17  ALT 15   ALKPHOS 70  BILITOT 0.7   ------------------------------------------------------------------------------------------------------------------  Cardiac Enzymes No results for input(s): "TROPONINI" in the last 168 hours. ------------------------------------------------------------------------------------------------------------------  RADIOLOGY:  CARDIAC CATHETERIZATION  Result Date: 12/20/2022 Conclusions: Severe single-vessel coronary artery disease with thrombotic occlusion of the mid LAD involving moderate-caliber D2 branch.  There is mild, nonobstructive CAD involving the LCx and RCA. Moderately to severely reduced left ventricular systolic function (LVEF 30-35%) with mid and apical anterior akinesis. Moderately elevated left ventricular filling pressure (LVEDP 25 mmHg). Successful PCI to mid LAD using Onyx Frontier 2.5 x 18 mm drug-eluting stent with 0% residual stenosis and TIMI-3 flow.  Jailed diagonal branch was also angioplastied with improvement in ostial stenosis from 70%->50% and reestablishment of TIMI I flow in the mid/distal vessel. Recommendations: Continue cangrelor infusion for 2 hours. Dual antiplatelet therapy with aspirin and ticagrelor for at least 12 months. Titrate nitroglycerin for relief of chest pain. Aggressive secondary prevention of coronary artery disease. Further evaluation and management of angioedema per primary team. Gentle diuresis and initiation of goal-directed medical therapy as tolerated. Obtain echocardiogram.  If LVEF remains less than 35%, consider LifeVest at discharge. Yvonne Kendall, MD [facility]  DG Chest Portable 1 View  Result Date: 12/20/2022 CLINICAL DATA:  Chest pain. EXAM: PORTABLE CHEST 1 VIEW COMPARISON:  CT 05/25/2016 FINDINGS: The heart is borderline enlarged. Aortic atherosclerosis. Peribronchial thickening without focal airspace disease. No large pleural effusion or pneumothorax. No acute osseous findings. IMPRESSION: 1. Peribronchial  thickening without focal airspace disease. 2. Borderline cardiomegaly. Electronically Signed   By: Narda Rutherford M.D.   On: 12/20/2022 20:50      IMPRESSION AND PLAN:  Assessment and Plan: * STEMI involving left anterior descending coronary artery (HCC) - The patient is status post PCI and stent of his mid LAD for a complete occlusion.  She continues to have a thrombosed diagonal branch that is  likely the culprit for her residual chest pain that underwent PCI as well as mentioned above. - We will continue her IV nitroglycerin drip, aspirin, Brilinta and high-dose statin therapy. - She will continue on as needed IV morphine sulfate for pain. - We will add small dose beta-blocker therapy with Lopressor. - 2D echo and cardiology consult will be obtained at [facility] clinic in AM. - I notified Dr. Juliann Pares about the patient.  Angioedema - The patient was initially given 125 mg of IV Solu-Medrol, IV Pepcid and Benadryl. - She was later ordered C1 esterase inhibitor and tranexamic acid by Dr. Okey Dupre.  So far we are holding both off temporarily since her tongue swelling is significantly improved. - We will continue to closely monitor her.  Hypothyroidism - We will continue Synthroid.  Asthma, chronic - We will continue her albuterol inhaler.  GERD without esophagitis - We will continue  PPI therapy.  Rheumatoid arthritis (HCC) - She is on methotrexate and Cosentyx.  No current flare.   DVT prophylaxis: Lovenox.  Advanced Care Planning:  Code Status: full code.  Family Communication:  The plan of care was discussed in details with the patient (and family). I answered all questions. The patient agreed to proceed with the above mentioned plan. Further management will depend upon hospital course. Disposition Plan: Back to previous home environment Consults called: Cardiology. All the records are reviewed and case discussed with ED provider.  Status is: Inpatient  At the time of the  admission, it appears that the appropriate admission status for this patient is inpatient.  This is judged to be reasonable and necessary in order to provide the required intensity of service to ensure the patient's safety given the presenting symptoms, physical exam findings and initial radiographic and laboratory data in the context of comorbid conditions.  The patient requires inpatient status due to high intensity of service, high risk of further deterioration and high frequency of surveillance required.  I certify that at the time of admission, it is my clinical judgment that the patient will require inpatient hospital care extending more than 2 midnights.                            Dispo: The patient is from: Home              Anticipated d/c is to: Home              Patient currently is not medically stable to d/c.              Difficult to place patient: No  Hannah Beat M.D on 12/21/2022 at 12:40 AM  [facility]   From 7 PM-7 AM, contact night-coverage [email or web address]  CC: Primary care physician; Patrice Paradise, MD

## 2022-12-21 NOTE — Progress Notes (Signed)
Progress Note   Patient: Jacqueline Rhodes:[id number] DOB: December 05, 1955 DOA: 12/20/2022     1 DOS: the patient was seen and examined on 12/21/2022   Brief hospital course: 67 year old female past medical history of COPD, CAD, GERD, hypertension, dyslipidemia, hypothyroidism, obstructive sleep apnea on CPAP, type 2 diabetes mellitus and peripheral vascular disease.  She presented with palpitations and a feeling of her heart jumping out of her chest.  She has been having episodes of angioedema on July 17 thought to be secondary to losartan that began on August 30 and again on August 8.  She took some Benadryl and EpiPen yesterday secondary to lip and tongue swelling.  She was admitted with a STEMI and had a stent placed in the LAD and angioplasty of a diagonal.  10/9.  Patient's lip and tongue swelling improved.  She does have an appointment with an allergist in early November.  Will send off a C1 esterase inhibitor.  Advise she can take Allegra and/or Zyrtec at home to try to prevent this.  Assessment and Plan: * STEMI involving left anterior descending coronary artery [facility]) The patient is status post PCI and stent of his mid LAD for a complete occlusion.  She continues to have a thrombosed diagonal branch that is likely the culprit for her residual chest pain that underwent PCI as well as mentioned above. Patient will be tapered off nitroglycerin drip today.  Continue aspirin, Brilinta.  Looks like patient on pravastatin and Zetia.  Angioedema - The patient was initially given 125 mg of IV Solu-Medrol, IV Pepcid and Benadryl. -Send off C1 esterase inhibitor.  Could be repeated episodes of angioedema secondary to being on losartan on July 17.  Sometimes we can get reactions up to 12 weeks after coming off the medication. -Has appointment with an allergist in November. -Advised taking Zyrtec and Allegra upon discharge and hold it a week before appointment with allergist.  Hypothyroidism Continue  Synthroid.  Asthma, chronic Continue her albuterol inhaler.  Obesity, Class III, BMI 40-49.9 (morbid obesity) (HCC) BMI 41.71.  GERD without esophagitis - We will continue  PPI therapy.  Rheumatoid arthritis (HCC) methotrexate and Cosentyx.  No current flare.        Subjective: Patient had some chest discomfort.  Weaning off nitroglycerin drip today.  Admitted with STEMI.  Prior to that had allergic reaction with angioedema with swelling of the tongue and lips where she took Benadryl and epinephrine.  Physical Exam: Vitals:   12/21/22 1115 12/21/22 1130 12/21/22 1145 12/21/22 1200  BP: 132/80 120/69 131/69 119/83  Pulse: 67 72 67 65  Resp: 18 17 (!) 22 19  Temp:      TempSrc:      SpO2: 95% 96% 94% 97%  Weight:      Height:       Physical Exam HENT:     Head: Normocephalic.     Mouth/Throat:     Pharynx: No oropharyngeal exudate.  Eyes:     General: Lids are normal.  Cardiovascular:     Rate and Rhythm: Normal rate and regular rhythm.     Heart sounds: S1 normal and S2 normal. Murmur heard.     Systolic murmur is present with a grade of 2/6.  Pulmonary:     Breath sounds: No decreased breath sounds, wheezing, rhonchi or rales.  Abdominal:     Palpations: Abdomen is soft.     Tenderness: There is no abdominal tenderness.  Musculoskeletal:     Right lower  leg: No swelling.     Left lower leg: No swelling.  Skin:    General: Skin is warm.     Findings: No rash.  Neurological:     Mental Status: She is alert and oriented to person, place, and time.     Data Reviewed: Sodium 134, potassium 3.4, creatinine 1.06, LDL 108, CBC normal range  Family Communication: spoke with husband on the phone  Disposition: Status is: Inpatient Remains inpatient appropriate because: will watch today in the hospital and re-assess tomorrow. Wean off ntg drip today. Planned Discharge Destination: Home    Time spent: 28 minutes  Author: Alford Highland, MD 12/21/2022  12:23 PM  For on call review [email or web address].

## 2022-12-21 NOTE — Assessment & Plan Note (Addendum)
BMI 40.83

## 2022-12-21 NOTE — Assessment & Plan Note (Addendum)
The patient is status post PCI and stent of his mid LAD for a complete occlusion.  She continues to have a thrombosed diagonal branch that is likely the culprit for her residual chest pain that underwent PCI as well as mentioned above. Continue aspirin, Brilinta.  Also on pravastatin (secondary to statin intolerance)(patient will likely not take pravastatin at home) and Zetia.  Started spironolactone for EF of 40%.  Imdur increased to 120 mg daily with cardiology team today.  (Patient will likely only take 60 mg at home).

## 2022-12-21 NOTE — Assessment & Plan Note (Addendum)
Continue PPI therapy.

## 2022-12-21 NOTE — Progress Notes (Signed)
[facility] CLINIC CARDIOLOGY PROGRESS NOTE   Patient ID: Jacqueline Rhodes MRN: [id number] DOB/AGE: 69-11-1955 69 y.o.  Admit date: 12/20/2022 Referring Physician Dr. Okey Dupre Primary Physician Dr. Merlinda Frederick Primary Cardiologist Dr. Lorelle Gibbs Reason for Consultation STEMI  HPI: [name] is a 67 y.o. female with a past medical history of coronary artery disease, hypertension, hyperlipidemia, peripheral vascular disease, COPD, type 2 diabetes, GERD, OSA on CPAP who presented to the ED on 12/20/2022 for allergic reaction.  Recently has been having episodes of angioedema, had an episode yesterday afternoon involving significant swelling of her tongue, she used her EpiPen around 7:30 PM and came to the ED for evaluation.  Also reported chest pressure that began when she was coming to the ED, this became stabbing pain while in the ED.  While in the ED EKG demonstrated acute STEMI and thus she was taken to the cath lab and received PCI and DES to mid LAD with Onyx Frontier 2.5 x 18 mm.  Interval History:  -Patient reports overall she is feeling better today.  States her chest pain is slowly improved, now a 6 out of 10.   -Remains on nitroglycerin infusion.  BP remains stable. -Tongue and lip swelling improved.  Patient reports that she is scheduled for outpatient evaluation with an allergist in a few weeks.  Review of systems complete and found to be negative unless listed above    Vitals:   12/21/22 1115 12/21/22 1130 12/21/22 1145 12/21/22 1200  BP: 132/80 120/69 131/69 119/83  Pulse: 67 72 67 65  Resp: 18 17 (!) 22 19  Temp:      TempSrc:      SpO2: 95% 96% 94% 97%  Weight:      Height:         Intake/Output Summary (Last 24 hours) at 12/21/2022 1216 Last data filed at 12/21/2022 1053 Gross per 24 hour  Intake 950.06 ml  Output 1100 ml  Net -149.94 ml     PHYSICAL EXAM General: Well-appearing, well nourished, in no acute distress laying in a slight incline in hospital bed. HEENT:  Normocephalic and atraumatic. Neck: No JVD.  Lungs: Normal respiratory effort on room air. Clear bilaterally to auscultation. No wheezes, crackles, rhonchi.  Heart: HRRR. Normal S1 and S2 without gallops or murmurs. Radial & DP pulses 2+ bilaterally. Abdomen: Non-distended appearing.  Msk: Normal strength and tone for age. Extremities: No clubbing, cyanosis or edema.   Neuro: Alert and oriented X 3. Psych: Mood appropriate, affect congruent.    LABS: Basic Metabolic Panel: Recent Labs    12/20/22 2036 12/21/22 0414  NA 136 134*  K 2.7* 3.4*  CL 100 98  CO2 26 23  GLUCOSE 188* 245*  BUN 22 23  CREATININE 1.15* 1.06*  CALCIUM 9.1 8.8*   Liver Function Tests: Recent Labs    12/20/22 2036  AST 17  ALT 15  ALKPHOS 70  BILITOT 0.7  PROT 7.1  ALBUMIN 3.7   No results for input(s): "LIPASE", "AMYLASE" in the last 72 hours. CBC: Recent Labs    12/20/22 2036 12/21/22 0414  WBC 11.5* 9.2  NEUTROABS 4.9  --   HGB 13.9 13.2  HCT 41.6 38.6  MCV 88.1 86.0  PLT 334 273   Cardiac Enzymes: Recent Labs    12/20/22 2036 12/20/22 2321  TROPONINIHS 7 1,220*   BNP: No results for input(s): "BNP" in the last 72 hours. D-Dimer: No results for input(s): "DDIMER" in the last 72 hours. Hemoglobin A1C: No  results for input(s): "HGBA1C" in the last 72 hours. Fasting Lipid Panel: Recent Labs    12/21/22 0414  CHOL 170  HDL 50  LDLCALC 108*  TRIG 60  CHOLHDL 3.4   Thyroid Function Tests: No results for input(s): "TSH", "T4TOTAL", "T3FREE", "THYROIDAB" in the last 72 hours.  Invalid input(s): "FREET3" Anemia Panel: No results for input(s): "VITAMINB12", "FOLATE", "FERRITIN", "TIBC", "IRON", "RETICCTPCT" in the last 72 hours.  CARDIAC CATHETERIZATION  Result Date: 12/20/2022 Conclusions: Severe single-vessel coronary artery disease with thrombotic occlusion of the mid LAD involving moderate-caliber D2 branch.  There is mild, nonobstructive CAD involving the LCx and  RCA. Moderately to severely reduced left ventricular systolic function (LVEF 30-35%) with mid and apical anterior akinesis. Moderately elevated left ventricular filling pressure (LVEDP 25 mmHg). Successful PCI to mid LAD using Onyx Frontier 2.5 x 18 mm drug-eluting stent with 0% residual stenosis and TIMI-3 flow.  Jailed diagonal branch was also angioplastied with improvement in ostial stenosis from 70%->50% and reestablishment of TIMI I flow in the mid/distal vessel. Recommendations: Continue cangrelor infusion for 2 hours. Dual antiplatelet therapy with aspirin and ticagrelor for at least 12 months. Titrate nitroglycerin for relief of chest pain. Aggressive secondary prevention of coronary artery disease. Further evaluation and management of angioedema per primary team. Gentle diuresis and initiation of goal-directed medical therapy as tolerated. Obtain echocardiogram.  If LVEF remains less than 35%, consider LifeVest at discharge. Yvonne Kendall, MD [facility]  DG Chest Portable 1 View  Result Date: 12/20/2022 CLINICAL DATA:  Chest pain. EXAM: PORTABLE CHEST 1 VIEW COMPARISON:  CT 05/25/2016 FINDINGS: The heart is borderline enlarged. Aortic atherosclerosis. Peribronchial thickening without focal airspace disease. No large pleural effusion or pneumothorax. No acute osseous findings. IMPRESSION: 1. Peribronchial thickening without focal airspace disease. 2. Borderline cardiomegaly. Electronically Signed   By: Narda Rutherford M.D.   On: 12/20/2022 20:50     ECHO pending  TELEMETRY reviewed by me 12/21/22: Sinus rhythm rate 70-80s  EKG reviewed by me 12/21/22: Sinus tachycardia, bigeminal PVCs, with lateral ST elevation rate 117 bpm  DATA reviewed by me 12/21/22: last 24h vitals tele labs imaging I/O admission H&P, cardiology consult note, catheterization report  Principal Problem:   STEMI involving left anterior descending coronary artery (HCC) Active Problems:   Angioedema   Asthma,  chronic   Hypothyroidism   Rheumatoid arthritis (HCC)   ST elevation myocardial infarction involving left anterior descending (LAD) coronary artery (HCC)   STEMI (ST elevation myocardial infarction) (HCC)   GERD without esophagitis    ASSESSMENT AND PLAN: AIMAN SCHLIES is a 67 y.o. female with a past medical history of coronary artery disease, hypertension, hyperlipidemia, peripheral vascular disease, COPD, type 2 diabetes, GERD, OSA on CPAP who presented to the ED on 12/20/2022 for allergic reaction.  Recently has been having episodes of angioedema, had an episode yesterday afternoon involving significant swelling of her tongue, she used her EpiPen around 7:30 PM and came to the ED for evaluation.  Also reported chest pressure that began when she was coming to the ED.  While in the ED EKG demonstrated acute STEMI and thus she was taken to the cath lab and received PCI and DES to mid LAD with Onyx Frontier 2.5 x 18 mm.  # STEMI # Coronary artery disease Patient presented for concerns of tongue swelling also reporting chest pressure. EKG concerning for STEMI. LHC with mid LAD thrombotic occlusion, s/p DES to mid LAD with 0% residual stenosis, jailed D2 s/p balloon  angioplasty.  -Continue DAPT with aspirin 81 mg daily, brilinta 90 mg twice daily uninterrupted for 12 months.  -Continue cholesterol management as below.   # Hypertension # Hyperlipidemia BP elevated on presentation now improved. HR remains stable. Cholesterol checked today with TC 170, LDL 108. Patient previously taking pravastatin with reported intolerance to other statins.  -Continue pravastatin 20 mg daily and zetia 10 mg daily.  -Continue metoprolol 25 mg twice daily.   # Angioedema Patient with multiple episodes of tongue and lip swelling recently.  Seen in the ED for this in August and was discharged home with an EpiPen.  Reportedly is scheduled to see an allergist in November. -Patient will need further workup of causes  of her angioedema. -Management per primary   This patient's case was discussed and created with Dr. Juliann Pares and he is in agreement.  Signed:  Gale Journey, PA-C  12/21/2022, 12:16 PM [facility] Cardiology

## 2022-12-22 ENCOUNTER — Inpatient Hospital Stay
Admit: 2022-12-22 | Discharge: 2022-12-22 | Disposition: A | Discharge status: Home / Self Care | Payer: PPO | Attending: Internal Medicine | Admitting: Internal Medicine

## 2022-12-22 DIAGNOSIS — J4520 Mild intermittent asthma, uncomplicated: Secondary | ICD-10-CM | POA: Diagnosis not present

## 2022-12-22 DIAGNOSIS — T783XXS Angioneurotic edema, sequela: Secondary | ICD-10-CM | POA: Diagnosis not present

## 2022-12-22 DIAGNOSIS — I2102 ST elevation (STEMI) myocardial infarction involving left anterior descending coronary artery: Secondary | ICD-10-CM | POA: Diagnosis not present

## 2022-12-22 DIAGNOSIS — E039 Hypothyroidism, unspecified: Secondary | ICD-10-CM | POA: Diagnosis not present

## 2022-12-22 LAB — BASIC METABOLIC PANEL
Anion gap: 10 (ref 5–15)
BUN: 29 mg/dL — ABNORMAL HIGH (ref 8–23)
CO2: 24 mmol/L (ref 22–32)
Calcium: 8.7 mg/dL — ABNORMAL LOW (ref 8.9–10.3)
Chloride: 101 mmol/L (ref 98–111)
Creatinine, Ser: 0.87 mg/dL (ref 0.44–1.00)
GFR, Estimated: >60 mL/min (ref >60)
Glucose, Bld: 161 mg/dL — ABNORMAL HIGH (ref 70–99)
Potassium: 3.9 mmol/L (ref 3.5–5.1)
Sodium: 135 mmol/L (ref 135–145)

## 2022-12-22 LAB — ECHOCARDIOGRAM COMPLETE
AR max vel: 2.14 cm2
AV Area VTI: 2.26 cm2
AV Area mean vel: 2.29 cm2
AV Mean grad: 5 mm[Hg]
AV Peak grad: 11.4 mm[Hg]
Ao pk vel: 1.69 m/s
Area-P 1/2: 3.45 cm2
Height: 64 in
MV VTI: 1.8 cm2
P 1/2 time: 411 ms
S' Lateral: 3.6 cm
Single Plane A4C EF: 57.1 %
Weight: 3806.02 [oz_av]

## 2022-12-22 LAB — TROPONIN I (HIGH SENSITIVITY)
Troponin I (High Sensitivity): 16336 ng/L (ref <18)
Troponin I (High Sensitivity): 16156 ng/L (ref <18)

## 2022-12-22 MED ORDER — PERFLUTREN LIPID MICROSPHERE
1.0000 mL | INTRAVENOUS | Status: AC | PRN
  Administered 2022-12-22: 6 mL via INTRAVENOUS

## 2022-12-22 MED ORDER — METOPROLOL SUCCINATE ER 25 MG PO TB24: 25.0000 mg | ORAL_TABLET | Freq: Every day | ORAL | Status: DC

## 2022-12-22 MED ORDER — METOPROLOL TARTRATE 25 MG PO TABS: 25.0000 mg | ORAL_TABLET | Freq: Two times a day (BID) | ORAL | Status: DC

## 2022-12-22 MED ORDER — ISOSORBIDE MONONITRATE ER 60 MG PO TB24: 60.0000 mg | ORAL_TABLET | Freq: Every day | ORAL | Status: DC

## 2022-12-22 MED ORDER — PREDNISONE 20 MG PO TABS
20.0000 mg | ORAL_TABLET | Freq: Every day | ORAL | Status: DC
  Administered 2022-12-22: 20 mg via ORAL
  Filled 2022-12-22: qty 2

## 2022-12-22 MED ORDER — METOPROLOL SUCCINATE ER 25 MG PO TB24
25.0000 mg | ORAL_TABLET | Freq: Two times a day (BID) | ORAL | Status: DC
  Administered 2022-12-22 – 2022-12-24 (×4): 25 mg via ORAL
  Filled 2022-12-22 (×4): qty 1

## 2022-12-22 MED ORDER — SPIRONOLACTONE 12.5 MG HALF TABLET
12.5000 mg | ORAL_TABLET | Freq: Every day | ORAL | Status: DC
  Administered 2022-12-22 – 2022-12-24 (×3): 12.5 mg via ORAL
  Filled 2022-12-22 (×3): qty 1

## 2022-12-22 NOTE — Progress Notes (Addendum)
[facility] CLINIC CARDIOLOGY PROGRESS NOTE   Patient ID: Jacqueline Rhodes MRN: [id number] DOB/AGE: 04-07-1955 67 y.o.  Admit date: 12/20/2022 Referring Physician Dr. Okey Dupre Primary Physician Dr. Merlinda Frederick Primary Cardiologist Dr. Lorelle Gibbs Reason for Consultation STEMI  HPI: Jacqueline Rhodes is a 67 y.o. female with a past medical history of coronary artery disease, hypertension, hyperlipidemia, peripheral vascular disease, COPD, type 2 diabetes, GERD, OSA on CPAP who presented to the ED on 12/20/2022 for allergic reaction.  Recently has been having episodes of angioedema, had an episode yesterday afternoon involving significant swelling of her tongue, she used her EpiPen around 7:30 PM and came to the ED for evaluation.  Also reported chest pressure that began when she was coming to the ED, this became stabbing pain while in the ED.  While in the ED EKG demonstrated acute STEMI and thus she was taken to the cath lab and received PCI and DES to mid LAD with Onyx Frontier 2.5 x 18 mm.  Interval History:  -Patient reports that her chest pain is improved overall. Home imdur was restarted overnight and she feels much better.  -BP and HR controlled this AM.  -No recurrence of tongue and lip swelling.  C1 esterase inhibitor lab obtained this admission, results pending. Patient reports that she is scheduled for outpatient evaluation with an allergist in a few weeks.  Review of systems complete and found to be negative unless listed above    Vitals:   12/22/22 0200 12/22/22 0400 12/22/22 0600 12/22/22 0800  BP: 121/71 117/68 121/73 (!) 119/54  Pulse: (!) 55 (!) 52 (!) 51 79  Resp: 14 16 13  (!) 32  Temp:  98.3 F (36.8 C)  98.4 F (36.9 C)  TempSrc:    Oral  SpO2: 94% 95% 96% 97%  Weight:   107.9 kg   Height:         Intake/Output Summary (Last 24 hours) at 12/22/2022 1055 Last data filed at 12/21/2022 2200 Gross per 24 hour  Intake 415.18 ml  Output --  Net 415.18 ml     PHYSICAL  EXAM General: Well-appearing, well nourished, in no acute distress layingnearly flat in hospital bed with daughter present at bedside. HEENT: Normocephalic and atraumatic. Neck: No JVD.  Lungs: Normal respiratory effort on room air. Clear bilaterally to auscultation. No wheezes, crackles, rhonchi.  Heart: HRRR. Normal S1 and S2 without gallops or murmurs. Radial & DP pulses 2+ bilaterally. Abdomen: Non-distended appearing.  Msk: Normal strength and tone for age. Extremities: No clubbing, cyanosis or edema.   Neuro: Alert and oriented X 3. Psych: Mood appropriate, affect congruent.    LABS: Basic Metabolic Panel: Recent Labs    12/21/22 0414 12/22/22 0445  NA 134* 135  K 3.4* 3.9  CL 98 101  CO2 23 24  GLUCOSE 245* 161*  BUN 23 29*  CREATININE 1.06* 0.87  CALCIUM 8.8* 8.7*   Liver Function Tests: Recent Labs    12/20/22 2036  AST 17  ALT 15  ALKPHOS 70  BILITOT 0.7  PROT 7.1  ALBUMIN 3.7   No results for input(s): "LIPASE", "AMYLASE" in the last 72 hours. CBC: Recent Labs    12/20/22 2036 12/21/22 0414  WBC 11.5* 9.2  NEUTROABS 4.9  --   HGB 13.9 13.2  HCT 41.6 38.6  MCV 88.1 86.0  PLT 334 273   Cardiac Enzymes: Recent Labs    12/21/22 1702 12/22/22 0445 12/22/22 0713  TROPONINIHS 18,592* 16,336* 16,156*   BNP: No results for  input(s): "BNP" in the last 72 hours. D-Dimer: No results for input(s): "DDIMER" in the last 72 hours. Hemoglobin A1C: No results for input(s): "HGBA1C" in the last 72 hours. Fasting Lipid Panel: Recent Labs    12/21/22 0414  CHOL 170  HDL 50  LDLCALC 108*  TRIG 60  CHOLHDL 3.4   Thyroid Function Tests: No results for input(s): "TSH", "T4TOTAL", "T3FREE", "THYROIDAB" in the last 72 hours.  Invalid input(s): "FREET3" Anemia Panel: No results for input(s): "VITAMINB12", "FOLATE", "FERRITIN", "TIBC", "IRON", "RETICCTPCT" in the last 72 hours.  CARDIAC CATHETERIZATION  Result Date: 12/20/2022 Conclusions: Severe  single-vessel coronary artery disease with thrombotic occlusion of the mid LAD involving moderate-caliber D2 branch.  There is mild, nonobstructive CAD involving the LCx and RCA. Moderately to severely reduced left ventricular systolic function (LVEF 30-35%) with mid and apical anterior akinesis. Moderately elevated left ventricular filling pressure (LVEDP 25 mmHg). Successful PCI to mid LAD using Onyx Frontier 2.5 x 18 mm drug-eluting stent with 0% residual stenosis and TIMI-3 flow.  Jailed diagonal branch was also angioplastied with improvement in ostial stenosis from 70%->50% and reestablishment of TIMI I flow in the mid/distal vessel. Recommendations: Continue cangrelor infusion for 2 hours. Dual antiplatelet therapy with aspirin and ticagrelor for at least 12 months. Titrate nitroglycerin for relief of chest pain. Aggressive secondary prevention of coronary artery disease. Further evaluation and management of angioedema per primary team. Gentle diuresis and initiation of goal-directed medical therapy as tolerated. Obtain echocardiogram.  If LVEF remains less than 35%, consider LifeVest at discharge. Yvonne Kendall, MD [facility]  DG Chest Portable 1 View  Result Date: 12/20/2022 CLINICAL DATA:  Chest pain. EXAM: PORTABLE CHEST 1 VIEW COMPARISON:  CT 05/25/2016 FINDINGS: The heart is borderline enlarged. Aortic atherosclerosis. Peribronchial thickening without focal airspace disease. No large pleural effusion or pneumothorax. No acute osseous findings. IMPRESSION: 1. Peribronchial thickening without focal airspace disease. 2. Borderline cardiomegaly. Electronically Signed   By: Narda Rutherford M.D.   On: 12/20/2022 20:50     ECHO pending  TELEMETRY reviewed by me 12/22/22: Sinus rhythm rate 60s  EKG reviewed by me 12/22/22: Sinus tachycardia, bigeminal PVCs, with lateral ST elevation rate 117 bpm  DATA reviewed by me 12/22/22: last 24h vitals tele labs imaging I/O hospitalist progress  note  Principal Problem:   STEMI involving left anterior descending coronary artery (HCC) Active Problems:   Angioedema   Asthma, chronic   Hypothyroidism   Rheumatoid arthritis (HCC)   ST elevation myocardial infarction involving left anterior descending (LAD) coronary artery (HCC)   STEMI (ST elevation myocardial infarction) (HCC)   GERD without esophagitis   Obesity, Class III, BMI 40-49.9 (morbid obesity) (HCC)    ASSESSMENT AND PLAN: Jacqueline Rhodes is a 67 y.o. female with a past medical history of coronary artery disease, hypertension, hyperlipidemia, peripheral vascular disease, COPD, type 2 diabetes, GERD, OSA on CPAP who presented to the ED on 12/20/2022 for allergic reaction.  Recently has been having episodes of angioedema, had an episode yesterday afternoon involving significant swelling of her tongue, she used her EpiPen around 7:30 PM and came to the ED for evaluation.  Also reported chest pressure that began when she was coming to the ED.  While in the ED EKG demonstrated acute STEMI and thus she was taken to the cath lab and received PCI and DES to mid LAD with Onyx Frontier 2.5 x 18 mm.  # STEMI # Coronary artery disease Patient presented for concerns of tongue swelling  also reporting chest pressure. EKG concerning for STEMI. LHC with mid LAD thrombotic occlusion, s/p DES to mid LAD with 0% residual stenosis, jailed D2 s/p balloon angioplasty.  -Continue DAPT with aspirin 81 mg daily, brilinta 90 mg twice daily uninterrupted for 12 months.  -Echo pending -Continue imdur 60 mg daily.  -Continue cholesterol management as below.   # Hypertension # Hyperlipidemia BP elevated on presentation now improved. HR remains stable. Cholesterol checked today with TC 170, LDL 108. Patient previously tolerated pravastatin, reported intolerance to other statins.  -Continue pravastatin 20 mg daily and zetia 10 mg daily.  -Continue metoprolol.  # Angioedema Patient with multiple  episodes of tongue and lip swelling recently.  Seen in the ED for this in August and was discharged home with an EpiPen.  Reportedly is scheduled to see an allergist in November. -Patient will need further workup of causes of her angioedema. C1 esterase inhibitor lab pending.  -Management per primary   This patient's case was discussed and created with Dr. Darrold Junker and he is in agreement.  Signed:  Gale Journey, PA-C  12/22/2022, 10:55 AM [facility] Cardiology

## 2022-12-22 NOTE — Plan of Care (Signed)
Pt hemodynamically stable this shift. One episode of chest pain s/p ambulation; quickly resolved without intervention. Denies pain otherwise. Voiding without difficulty. Pt updated on plan of care.   Problem: Education: Goal: Knowledge of General Education information will improve Description: Including pain rating scale, medication(s)/side effects and non-pharmacologic comfort measures Outcome: Progressing   Problem: Health Behavior/Discharge Planning: Goal: Ability to manage health-related needs will improve Outcome: Progressing   Problem: Clinical Measurements: Goal: Ability to maintain clinical measurements within normal limits will improve Outcome: Progressing Goal: Will remain free from infection Outcome: Progressing Goal: Diagnostic test results will improve Outcome: Progressing Goal: Respiratory complications will improve Outcome: Progressing Goal: Cardiovascular complication will be avoided Outcome: Progressing   Problem: Activity: Goal: Risk for activity intolerance will decrease Outcome: Progressing   Problem: Nutrition: Goal: Adequate nutrition will be maintained Outcome: Progressing   Problem: Coping: Goal: Level of anxiety will decrease Outcome: Progressing   Problem: Elimination: Goal: Will not experience complications related to bowel motility Outcome: Progressing Goal: Will not experience complications related to urinary retention Outcome: Progressing   Problem: Pain Managment: Goal: General experience of comfort will improve Outcome: Progressing   Problem: Safety: Goal: Ability to remain free from injury will improve Outcome: Progressing   Problem: Skin Integrity: Goal: Risk for impaired skin integrity will decrease Outcome: Progressing   Problem: Education: Goal: Understanding of CV disease, CV risk reduction, and recovery process will improve Outcome: Progressing Goal: Individualized Educational Video(s) Outcome: Progressing   Problem:  Activity: Goal: Ability to return to baseline activity level will improve Outcome: Progressing   Problem: Cardiovascular: Goal: Ability to achieve and maintain adequate cardiovascular perfusion will improve Outcome: Progressing Goal: Vascular access site(s) Level 0-1 will be maintained Outcome: Progressing   Problem: Health Behavior/Discharge Planning: Goal: Ability to safely manage health-related needs after discharge will improve Outcome: Progressing

## 2022-12-22 NOTE — TOC CM/SW Note (Signed)
Transition of Care [facility]) - Inpatient Brief Assessment   Patient Details  Name: Jacqueline Rhodes MRN: [id number] Date of Birth: 04/18/1955  Transition of Care [facility]) CM/SW Contact:    Margarito Liner, LCSW Phone Number: 12/22/2022, 1:02 PM   Clinical Narrative: CSW reviewed chart. No TOC needs identified at this time. CSW will continue to follow progress. Please place [facility] consult if any needs arise.   Transition of Care Asessment: Insurance and Status: Insurance coverage has been reviewed Patient has primary care physician: Yes Home environment has been reviewed: Single family home   Prior/Current Home Services: No current home services Social Determinants of Health Reivew: SDOH reviewed no interventions necessary Readmission risk has been reviewed: Yes Transition of care needs: no transition of care needs at this time

## 2022-12-22 NOTE — Progress Notes (Signed)
Progress Note   Patient: Jacqueline Rhodes ZOX:[id number] DOB: February 21, 1956 DOA: 12/20/2022     2 DOS: the patient was seen and examined on 12/22/2022   Brief hospital course: 67 year old female past medical history of COPD, CAD, GERD, hypertension, dyslipidemia, hypothyroidism, obstructive sleep apnea on CPAP, type 2 diabetes mellitus and peripheral vascular disease.  She presented with palpitations and a feeling of her heart jumping out of her chest.  She has been having episodes of angioedema on July 17 thought to be secondary to losartan that began on August 30 and again on August 8.  She took some Benadryl and EpiPen yesterday secondary to lip and tongue swelling.  She was admitted with a STEMI and had a stent placed in the LAD and angioplasty of a diagonal.  10/9.  Patient's lip and tongue swelling improved.  She does have an appointment with an allergist in early November.  Will send off a C1 esterase inhibitor.  Advise she can take Allegra and/or Zyrtec at home to try to prevent this.  Assessment and Plan: * STEMI involving left anterior descending coronary artery [facility]) The patient is status post PCI and stent of his mid LAD for a complete occlusion.  She continues to have a thrombosed diagonal branch that is likely the culprit for her residual chest pain that underwent PCI as well as mentioned above. Patient will be tapered off nitroglycerin drip today.  Continue aspirin, Brilinta.  Looks like patient on pravastatin and Zetia.  Starting spironolactone today to see if blood pressure will be okay with this with EF being on the lower side (40%)  Angioedema - The patient was initially given 125 mg of IV Solu-Medrol, IV Pepcid and Benadryl. -Sent off C1 esterase inhibitor.  Could be repeated episodes of angioedema secondary to being on losartan on July 17.  Sometimes we can get reactions up to 12 weeks after coming off the medication. -Has appointment with an allergist in November. -Advised  taking Zyrtec and Allegra upon discharge and hold it a week before appointment with allergist. -Prednisone 20 mg daily.  Will give a tapering dose of prednisone upon going home.  Hypothyroidism Continue Synthroid.  Asthma, chronic Continue her albuterol inhaler.  Obesity, Class III, BMI 40-49.9 (morbid obesity) (HCC) BMI 41.71.  GERD without esophagitis - We will continue  PPI therapy.  Rheumatoid arthritis (HCC) methotrexate and Cosentyx.  No current flare.        Subjective: Patient feeling better today than yesterday.  No further chest pain.  No shortness of breath.  Admitted with STEMI and angioedema.  Physical Exam: Vitals:   12/22/22 0800 12/22/22 1043 12/22/22 1114 12/22/22 1226  BP: (!) 119/54 (!) 155/87 (!) 144/73 (!) 140/79  Pulse: 79 87  73  Resp: (!) 32 (!) 25 (!) 26 19  Temp: 98.4 F (36.9 C)   98.3 F (36.8 C)  TempSrc: Oral     SpO2: 97% 99%  99%  Weight:      Height:       Physical Exam HENT:     Head: Normocephalic.     Mouth/Throat:     Pharynx: No oropharyngeal exudate.  Eyes:     General: Lids are normal.  Cardiovascular:     Rate and Rhythm: Normal rate and regular rhythm.     Heart sounds: S1 normal and S2 normal. Murmur heard.     Systolic murmur is present with a grade of 2/6.  Pulmonary:     Breath sounds: Examination of  the right-lower field reveals decreased breath sounds. Examination of the left-lower field reveals decreased breath sounds. Decreased breath sounds present. No wheezing, rhonchi or rales.  Abdominal:     Palpations: Abdomen is soft.     Tenderness: There is no abdominal tenderness.  Musculoskeletal:     Right lower leg: No swelling.     Left lower leg: No swelling.  Skin:    General: Skin is warm.     Findings: No rash.  Neurological:     Mental Status: She is alert and oriented to person, place, and time.     Data Reviewed: Creatinine 0.87 Family Communication: Spoke with sister at the  bedside  Disposition: Status is: Inpatient Remains inpatient appropriate because: Admitted with STEMI.  EF little bit on the lower side.  Adding spironolactone today and see if blood pressure will tolerate  Planned Discharge Destination: Home    Time spent: 28 minutes  Author: Alford Highland, MD 12/22/2022 3:25 PM  For on call review [email or web address].

## 2022-12-23 ENCOUNTER — Other Ambulatory Visit: Payer: Self-pay

## 2022-12-23 DIAGNOSIS — E039 Hypothyroidism, unspecified: Secondary | ICD-10-CM | POA: Diagnosis not present

## 2022-12-23 DIAGNOSIS — T783XXS Angioneurotic edema, sequela: Secondary | ICD-10-CM | POA: Diagnosis not present

## 2022-12-23 DIAGNOSIS — K219 Gastro-esophageal reflux disease without esophagitis: Secondary | ICD-10-CM | POA: Diagnosis not present

## 2022-12-23 DIAGNOSIS — R001 Bradycardia, unspecified: Secondary | ICD-10-CM | POA: Diagnosis not present

## 2022-12-23 DIAGNOSIS — I2102 ST elevation (STEMI) myocardial infarction involving left anterior descending coronary artery: Secondary | ICD-10-CM | POA: Diagnosis not present

## 2022-12-23 LAB — BASIC METABOLIC PANEL
Anion gap: 9 (ref 5–15)
BUN: 29 mg/dL — ABNORMAL HIGH (ref 8–23)
CO2: 26 mmol/L (ref 22–32)
Calcium: 8.9 mg/dL (ref 8.9–10.3)
Chloride: 104 mmol/L (ref 98–111)
Creatinine, Ser: 0.91 mg/dL (ref 0.44–1.00)
GFR, Estimated: >60 mL/min (ref >60)
Glucose, Bld: 124 mg/dL — ABNORMAL HIGH (ref 70–99)
Potassium: 3.9 mmol/L (ref 3.5–5.1)
Sodium: 139 mmol/L (ref 135–145)

## 2022-12-23 LAB — CBC
HCT: 38.2 % (ref 36.0–46.0)
Hemoglobin: 12.4 g/dL (ref 12.0–15.0)
MCH: 29.3 pg (ref 26.0–34.0)
MCHC: 32.5 g/dL (ref 30.0–36.0)
MCV: 90.3 fL (ref 80.0–100.0)
Platelets: 228 10*3/uL (ref 150–400)
RBC: 4.23 MIL/uL (ref 3.87–5.11)
RDW: 14.1 % (ref 11.5–15.5)
WBC: 9 10*3/uL (ref 4.0–10.5)
nRBC: 0 % (ref 0.0–0.2)

## 2022-12-23 LAB — TROPONIN I (HIGH SENSITIVITY): Troponin I (High Sensitivity): 7829 ng/L (ref <18)

## 2022-12-23 LAB — LIPOPROTEIN A (LPA): Lipoprotein (a): 95.5 nmol/L — ABNORMAL HIGH (ref <75.0)

## 2022-12-23 LAB — C1 ESTERASE INHIBITOR: C1INH SerPl-mCnc: 35 mg/dL (ref 21–39)

## 2022-12-23 MED ORDER — EZETIMIBE 10 MG PO TABS
10.0000 mg | ORAL_TABLET | Freq: Every day | ORAL | 0 refills | Status: DC
  Filled 2022-12-23: qty 30, 30d supply, fill #0

## 2022-12-23 MED ORDER — PREDNISONE 10 MG PO TABS
10.0000 mg | ORAL_TABLET | Freq: Every day | ORAL | Status: DC
  Administered 2022-12-23 – 2022-12-24 (×2): 10 mg via ORAL
  Filled 2022-12-23 (×2): qty 1

## 2022-12-23 MED ORDER — SPIRONOLACTONE 25 MG PO TABS
12.5000 mg | ORAL_TABLET | Freq: Every day | ORAL | 0 refills | Status: DC
  Filled 2022-12-23: qty 15, 30d supply, fill #0

## 2022-12-23 MED ORDER — ASPIRIN 81 MG PO CHEW
81.0000 mg | CHEWABLE_TABLET | Freq: Every day | ORAL | 0 refills | Status: AC
  Filled 2022-12-23: qty 30, 30d supply, fill #0

## 2022-12-23 MED ORDER — PREDNISONE 10 MG PO TABS
ORAL_TABLET | ORAL | 0 refills | Status: AC
  Filled 2022-12-23: qty 8, 11d supply, fill #0

## 2022-12-23 MED ORDER — PRAVASTATIN SODIUM 20 MG PO TABS
20.0000 mg | ORAL_TABLET | Freq: Every day | ORAL | 0 refills | Status: DC
  Filled 2022-12-23: qty 30, 30d supply, fill #0

## 2022-12-23 MED ORDER — ISOSORBIDE MONONITRATE ER 60 MG PO TB24
120.0000 mg | ORAL_TABLET | Freq: Every day | ORAL | Status: DC
  Administered 2022-12-24: 120 mg via ORAL
  Filled 2022-12-23: qty 2

## 2022-12-23 MED ORDER — NITROGLYCERIN 0.4 MG SL SUBL
0.4000 mg | SUBLINGUAL_TABLET | SUBLINGUAL | 0 refills | Status: AC | PRN
Start: 2022-12-23 — End: ?
  Filled 2022-12-23: qty 25, 25d supply, fill #0

## 2022-12-23 MED ORDER — METOPROLOL SUCCINATE ER 25 MG PO TB24
25.0000 mg | ORAL_TABLET | Freq: Two times a day (BID) | ORAL | 0 refills | Status: DC
  Filled 2022-12-23: qty 60, 30d supply, fill #0

## 2022-12-23 MED ORDER — TICAGRELOR 90 MG PO TABS
90.0000 mg | ORAL_TABLET | Freq: Two times a day (BID) | ORAL | 0 refills | Status: DC
  Filled 2022-12-23: qty 60, 30d supply, fill #0

## 2022-12-23 MED ORDER — ISOSORBIDE MONONITRATE ER 60 MG PO TB24
120.0000 mg | ORAL_TABLET | Freq: Every day | ORAL | Status: DC
  Administered 2022-12-23: 120 mg via ORAL
  Filled 2022-12-23: qty 2

## 2022-12-23 MED ORDER — NITROGLYCERIN 0.4 MG SL SUBL: 0.4000 mg | SUBLINGUAL_TABLET | SUBLINGUAL | Status: DC | PRN

## 2022-12-23 MED ORDER — ISOSORBIDE MONONITRATE ER 60 MG PO TB24
120.0000 mg | ORAL_TABLET | Freq: Every day | ORAL | 0 refills | Status: DC
  Filled 2022-12-23 (×2): qty 60, 30d supply, fill #0

## 2022-12-23 NOTE — Progress Notes (Signed)
Patient was complaining of chest pressure and pain at morning vitals. No dyspnea but breathing felt shallow. Performed ECG and administered 2mg  of iv morphine. Pain has improved from a 5/10 but patient is complaining of nausea and numbness radiating to left arm and jaw. Vitals are stable. Notified Jon Billings NP of patient's current condition. Troponin levels ordered. Patient is resting in recliner, requested patient return to bed for precautions. Patient declined. Jon Billings NP at the bedside to assess.

## 2022-12-23 NOTE — Progress Notes (Addendum)
[name] CLINIC CARDIOLOGY PROGRESS NOTE   Patient ID: Jacqueline Rhodes MRN: [id number] DOB/AGE: Dec 25, 1955 67 y.o.  Admit date: 12/20/2022 Referring Physician Dr. Okey Dupre Primary Physician Dr. Merlinda Frederick Primary Cardiologist Dr. Lorelle Gibbs Reason for Consultation STEMI  HPI: Jacqueline Rhodes is a 67 y.o. female with a past medical history of coronary artery disease, hypertension, hyperlipidemia, peripheral vascular disease, COPD, type 2 diabetes, GERD, OSA on CPAP who presented to the ED on 12/20/2022 for allergic reaction.  Recently has been having episodes of angioedema, had an episode yesterday afternoon involving significant swelling of her tongue, she used her EpiPen around 7:30 PM and came to the ED for evaluation.  Also reported chest pressure that began when she was coming to the ED, this became stabbing pain while in the ED.  While in the ED EKG demonstrated acute STEMI and thus she was taken to the cath lab and received PCI and DES to mid LAD with Onyx Frontier 2.5 x 18 mm.  Interval History:  -Patient had episode of CP early this AM, reports that sitting up in chair helped.  -Troponin this AM 7000. Chest pain resolved at the time of my evaluation. -BP and HR remain stable with addition of spironolactone yesterday.  Review of systems complete and found to be negative unless listed above    Vitals:   12/22/22 2348 12/23/22 0501 12/23/22 0617 12/23/22 0741  BP: (!) 107/54 130/76 (!) 148/51 (!) 137/54  Pulse: 69 60 (!) 51 (!) 57  Resp: 18 18  16   Temp: 98.4 F (36.9 C) 97.6 F (36.4 C)  97.8 F (36.6 C)  TempSrc:  Oral  Oral  SpO2: 97% 97% 100% 100%  Weight:      Height:         Intake/Output Summary (Last 24 hours) at 12/23/2022 0855 Last data filed at 12/22/2022 1505 Gross per 24 hour  Intake 0 ml  Output 1 ml  Net -1 ml     PHYSICAL EXAM General: Well-appearing, well nourished, in no acute distress laying nearly flat in hospital bed. HEENT: Normocephalic and  atraumatic. Neck: No JVD.  Lungs: Normal respiratory effort on room air. Clear bilaterally to auscultation. No wheezes, crackles, rhonchi.  Heart: HRRR. Normal S1 and S2 without gallops or murmurs. Radial & DP pulses 2+ bilaterally. Abdomen: Non-distended appearing.  Msk: Normal strength and tone for age. Extremities: No clubbing, cyanosis or edema.   Neuro: Alert and oriented X 3. Psych: Mood appropriate, affect congruent.    LABS: Basic Metabolic Panel: Recent Labs    12/22/22 0445 12/23/22 0553  NA 135 139  K 3.9 3.9  CL 101 104  CO2 24 26  GLUCOSE 161* 124*  BUN 29* 29*  CREATININE 0.87 0.91  CALCIUM 8.7* 8.9   Liver Function Tests: Recent Labs    12/20/22 2036  AST 17  ALT 15  ALKPHOS 70  BILITOT 0.7  PROT 7.1  ALBUMIN 3.7   No results for input(s): "LIPASE", "AMYLASE" in the last 72 hours. CBC: Recent Labs    12/20/22 2036 12/21/22 0414 12/23/22 0553  WBC 11.5* 9.2 9.0  NEUTROABS 4.9  --   --   HGB 13.9 13.2 12.4  HCT 41.6 38.6 38.2  MCV 88.1 86.0 90.3  PLT 334 273 228   Cardiac Enzymes: Recent Labs    12/22/22 0445 12/22/22 0713 12/23/22 0553  TROPONINIHS 16,336* 16,156* 7,829*   BNP: No results for input(s): "BNP" in the last 72 hours. D-Dimer: No results for input(s): "  [name] CLINIC CARDIOLOGY PROGRESS NOTE   Patient ID: Jacqueline Rhodes MRN: [id number] DOB/AGE: Dec 25, 1955 67 y.o.  Admit date: 12/20/2022 Referring Physician Dr. Okey Dupre Primary Physician Dr. Merlinda Frederick Primary Cardiologist Dr. Lorelle Gibbs Reason for Consultation STEMI  HPI: Jacqueline Rhodes is a 67 y.o. female with a past medical history of coronary artery disease, hypertension, hyperlipidemia, peripheral vascular disease, COPD, type 2 diabetes, GERD, OSA on CPAP who presented to the ED on 12/20/2022 for allergic reaction.  Recently has been having episodes of angioedema, had an episode yesterday afternoon involving significant swelling of her tongue, she used her EpiPen around 7:30 PM and came to the ED for evaluation.  Also reported chest pressure that began when she was coming to the ED, this became stabbing pain while in the ED.  While in the ED EKG demonstrated acute STEMI and thus she was taken to the cath lab and received PCI and DES to mid LAD with Onyx Frontier 2.5 x 18 mm.  Interval History:  -Patient had episode of CP early this AM, reports that sitting up in chair helped.  -Troponin this AM 7000. Chest pain resolved at the time of my evaluation. -BP and HR remain stable with addition of spironolactone yesterday.  Review of systems complete and found to be negative unless listed above    Vitals:   12/22/22 2348 12/23/22 0501 12/23/22 0617 12/23/22 0741  BP: (!) 107/54 130/76 (!) 148/51 (!) 137/54  Pulse: 69 60 (!) 51 (!) 57  Resp: 18 18  16   Temp: 98.4 F (36.9 C) 97.6 F (36.4 C)  97.8 F (36.6 C)  TempSrc:  Oral  Oral  SpO2: 97% 97% 100% 100%  Weight:      Height:         Intake/Output Summary (Last 24 hours) at 12/23/2022 0855 Last data filed at 12/22/2022 1505 Gross per 24 hour  Intake 0 ml  Output 1 ml  Net -1 ml     PHYSICAL EXAM General: Well-appearing, well nourished, in no acute distress laying nearly flat in hospital bed. HEENT: Normocephalic and  atraumatic. Neck: No JVD.  Lungs: Normal respiratory effort on room air. Clear bilaterally to auscultation. No wheezes, crackles, rhonchi.  Heart: HRRR. Normal S1 and S2 without gallops or murmurs. Radial & DP pulses 2+ bilaterally. Abdomen: Non-distended appearing.  Msk: Normal strength and tone for age. Extremities: No clubbing, cyanosis or edema.   Neuro: Alert and oriented X 3. Psych: Mood appropriate, affect congruent.    LABS: Basic Metabolic Panel: Recent Labs    12/22/22 0445 12/23/22 0553  NA 135 139  K 3.9 3.9  CL 101 104  CO2 24 26  GLUCOSE 161* 124*  BUN 29* 29*  CREATININE 0.87 0.91  CALCIUM 8.7* 8.9   Liver Function Tests: Recent Labs    12/20/22 2036  AST 17  ALT 15  ALKPHOS 70  BILITOT 0.7  PROT 7.1  ALBUMIN 3.7   No results for input(s): "LIPASE", "AMYLASE" in the last 72 hours. CBC: Recent Labs    12/20/22 2036 12/21/22 0414 12/23/22 0553  WBC 11.5* 9.2 9.0  NEUTROABS 4.9  --   --   HGB 13.9 13.2 12.4  HCT 41.6 38.6 38.2  MCV 88.1 86.0 90.3  PLT 334 273 228   Cardiac Enzymes: Recent Labs    12/22/22 0445 12/22/22 0713 12/23/22 0553  TROPONINIHS 16,336* 16,156* 7,829*   BNP: No results for input(s): "BNP" in the last 72 hours. D-Dimer: No results for input(s): "  DDIMER" in the last 72 hours. Hemoglobin A1C: No results for input(s): "HGBA1C" in the last 72 hours. Fasting Lipid Panel: Recent Labs    12/21/22 0414  CHOL 170  HDL 50  LDLCALC 108*  TRIG 60  CHOLHDL 3.4   Thyroid Function Tests: No results for input(s): "TSH", "T4TOTAL", "T3FREE", "THYROIDAB" in the last 72 hours.  Invalid input(s): "FREET3" Anemia Panel: No results for input(s): "VITAMINB12", "FOLATE", "FERRITIN", "TIBC", "IRON", "RETICCTPCT" in the last 72 hours.  ECHOCARDIOGRAM COMPLETE  Result Date: 12/22/2022    ECHOCARDIOGRAM REPORT   Patient Name:   Jacqueline Rhodes Date of Exam: 12/22/2022 Medical Rec #:  [id number]         Height:       64.0 in  Accession #:    [id number]        Weight:       237.9 lb Date of Birth:  Jul 14, 1955          BSA:          2.106 m Patient Age:    67 years          BP:           119/54 mmHg Patient Gender: F                 HR:           76 bpm. Exam Location:  [facility] Procedure: 2D Echo, 3D Echo, Cardiac Doppler, Color Doppler, Strain Analysis and            Intracardiac Opacification Agent Indications:     Acute MI  History:         Patient has no prior history of Echocardiogram examinations.                  Acute MI, Angina and CAD; Risk Factors:Hypertension and                  Diabetes.  Sonographer:     Mikki Harbor Referring Phys:  [phone] CHRISTOPHER END Diagnosing Phys: Marcina Millard MD  Sonographer Comments: Patient is obese. IMPRESSIONS  1. Left ventricular ejection fraction, by estimation, is 40 to 45%. The left ventricle has mildly decreased function. The left ventricle demonstrates regional wall motion abnormalities (see scoring diagram/findings for description). Left ventricular diastolic parameters were normal.  2. Right ventricular systolic function is normal. The right ventricular size is normal.  3. The mitral valve is normal in structure. Mild mitral valve regurgitation. No evidence of mitral stenosis.  4. The aortic valve is normal in structure. Aortic valve regurgitation is mild to moderate. No aortic stenosis is present.  5. The inferior vena cava is normal in size with greater than 50% respiratory variability, suggesting right atrial pressure of 3 mmHg. FINDINGS  Left Ventricle: Left ventricular ejection fraction, by estimation, is 40 to 45%. The left ventricle has mildly decreased function. The left ventricle demonstrates regional wall motion abnormalities. Definity contrast agent was given IV to delineate the left ventricular endocardial borders. The left ventricular internal cavity size was normal in size. There is no left ventricular hypertrophy. Left ventricular diastolic parameters were normal.   LV Wall Scoring: The apical lateral segment, apical septal segment, and apex are hypokinetic. Right Ventricle: The right ventricular size is normal. No increase in right ventricular wall thickness. Right ventricular systolic function is normal. Left Atrium: Left atrial size was normal in size. Right Atrium: Right atrial size was normal in size. Pericardium: There is no  DDIMER" in the last 72 hours. Hemoglobin A1C: No results for input(s): "HGBA1C" in the last 72 hours. Fasting Lipid Panel: Recent Labs    12/21/22 0414  CHOL 170  HDL 50  LDLCALC 108*  TRIG 60  CHOLHDL 3.4   Thyroid Function Tests: No results for input(s): "TSH", "T4TOTAL", "T3FREE", "THYROIDAB" in the last 72 hours.  Invalid input(s): "FREET3" Anemia Panel: No results for input(s): "VITAMINB12", "FOLATE", "FERRITIN", "TIBC", "IRON", "RETICCTPCT" in the last 72 hours.  ECHOCARDIOGRAM COMPLETE  Result Date: 12/22/2022    ECHOCARDIOGRAM REPORT   Patient Name:   Jacqueline Rhodes Date of Exam: 12/22/2022 Medical Rec #:  [id number]         Height:       64.0 in  Accession #:    [id number]        Weight:       237.9 lb Date of Birth:  Jul 14, 1955          BSA:          2.106 m Patient Age:    67 years          BP:           119/54 mmHg Patient Gender: F                 HR:           76 bpm. Exam Location:  [facility] Procedure: 2D Echo, 3D Echo, Cardiac Doppler, Color Doppler, Strain Analysis and            Intracardiac Opacification Agent Indications:     Acute MI  History:         Patient has no prior history of Echocardiogram examinations.                  Acute MI, Angina and CAD; Risk Factors:Hypertension and                  Diabetes.  Sonographer:     Mikki Harbor Referring Phys:  [phone] CHRISTOPHER END Diagnosing Phys: Marcina Millard MD  Sonographer Comments: Patient is obese. IMPRESSIONS  1. Left ventricular ejection fraction, by estimation, is 40 to 45%. The left ventricle has mildly decreased function. The left ventricle demonstrates regional wall motion abnormalities (see scoring diagram/findings for description). Left ventricular diastolic parameters were normal.  2. Right ventricular systolic function is normal. The right ventricular size is normal.  3. The mitral valve is normal in structure. Mild mitral valve regurgitation. No evidence of mitral stenosis.  4. The aortic valve is normal in structure. Aortic valve regurgitation is mild to moderate. No aortic stenosis is present.  5. The inferior vena cava is normal in size with greater than 50% respiratory variability, suggesting right atrial pressure of 3 mmHg. FINDINGS  Left Ventricle: Left ventricular ejection fraction, by estimation, is 40 to 45%. The left ventricle has mildly decreased function. The left ventricle demonstrates regional wall motion abnormalities. Definity contrast agent was given IV to delineate the left ventricular endocardial borders. The left ventricular internal cavity size was normal in size. There is no left ventricular hypertrophy. Left ventricular diastolic parameters were normal.   LV Wall Scoring: The apical lateral segment, apical septal segment, and apex are hypokinetic. Right Ventricle: The right ventricular size is normal. No increase in right ventricular wall thickness. Right ventricular systolic function is normal. Left Atrium: Left atrial size was normal in size. Right Atrium: Right atrial size was normal in size. Pericardium: There is no  [name] CLINIC CARDIOLOGY PROGRESS NOTE   Patient ID: Jacqueline Rhodes MRN: [id number] DOB/AGE: Dec 25, 1955 67 y.o.  Admit date: 12/20/2022 Referring Physician Dr. Okey Dupre Primary Physician Dr. Merlinda Frederick Primary Cardiologist Dr. Lorelle Gibbs Reason for Consultation STEMI  HPI: Jacqueline Rhodes is a 67 y.o. female with a past medical history of coronary artery disease, hypertension, hyperlipidemia, peripheral vascular disease, COPD, type 2 diabetes, GERD, OSA on CPAP who presented to the ED on 12/20/2022 for allergic reaction.  Recently has been having episodes of angioedema, had an episode yesterday afternoon involving significant swelling of her tongue, she used her EpiPen around 7:30 PM and came to the ED for evaluation.  Also reported chest pressure that began when she was coming to the ED, this became stabbing pain while in the ED.  While in the ED EKG demonstrated acute STEMI and thus she was taken to the cath lab and received PCI and DES to mid LAD with Onyx Frontier 2.5 x 18 mm.  Interval History:  -Patient had episode of CP early this AM, reports that sitting up in chair helped.  -Troponin this AM 7000. Chest pain resolved at the time of my evaluation. -BP and HR remain stable with addition of spironolactone yesterday.  Review of systems complete and found to be negative unless listed above    Vitals:   12/22/22 2348 12/23/22 0501 12/23/22 0617 12/23/22 0741  BP: (!) 107/54 130/76 (!) 148/51 (!) 137/54  Pulse: 69 60 (!) 51 (!) 57  Resp: 18 18  16   Temp: 98.4 F (36.9 C) 97.6 F (36.4 C)  97.8 F (36.6 C)  TempSrc:  Oral  Oral  SpO2: 97% 97% 100% 100%  Weight:      Height:         Intake/Output Summary (Last 24 hours) at 12/23/2022 0855 Last data filed at 12/22/2022 1505 Gross per 24 hour  Intake 0 ml  Output 1 ml  Net -1 ml     PHYSICAL EXAM General: Well-appearing, well nourished, in no acute distress laying nearly flat in hospital bed. HEENT: Normocephalic and  atraumatic. Neck: No JVD.  Lungs: Normal respiratory effort on room air. Clear bilaterally to auscultation. No wheezes, crackles, rhonchi.  Heart: HRRR. Normal S1 and S2 without gallops or murmurs. Radial & DP pulses 2+ bilaterally. Abdomen: Non-distended appearing.  Msk: Normal strength and tone for age. Extremities: No clubbing, cyanosis or edema.   Neuro: Alert and oriented X 3. Psych: Mood appropriate, affect congruent.    LABS: Basic Metabolic Panel: Recent Labs    12/22/22 0445 12/23/22 0553  NA 135 139  K 3.9 3.9  CL 101 104  CO2 24 26  GLUCOSE 161* 124*  BUN 29* 29*  CREATININE 0.87 0.91  CALCIUM 8.7* 8.9   Liver Function Tests: Recent Labs    12/20/22 2036  AST 17  ALT 15  ALKPHOS 70  BILITOT 0.7  PROT 7.1  ALBUMIN 3.7   No results for input(s): "LIPASE", "AMYLASE" in the last 72 hours. CBC: Recent Labs    12/20/22 2036 12/21/22 0414 12/23/22 0553  WBC 11.5* 9.2 9.0  NEUTROABS 4.9  --   --   HGB 13.9 13.2 12.4  HCT 41.6 38.6 38.2  MCV 88.1 86.0 90.3  PLT 334 273 228   Cardiac Enzymes: Recent Labs    12/22/22 0445 12/22/22 0713 12/23/22 0553  TROPONINIHS 16,336* 16,156* 7,829*   BNP: No results for input(s): "BNP" in the last 72 hours. D-Dimer: No results for input(s): "

## 2022-12-23 NOTE — Plan of Care (Signed)
  Problem: Education: Goal: Knowledge of General Education information will improve Description: Including pain rating scale, medication(s)/side effects and non-pharmacologic comfort measures Outcome: Completed/Met   Problem: Health Behavior/Discharge Planning: Goal: Ability to manage health-related needs will improve Outcome: Completed/Met   Problem: Clinical Measurements: Goal: Ability to maintain clinical measurements within normal limits will improve Outcome: Completed/Met Goal: Will remain free from infection Outcome: Completed/Met Goal: Diagnostic test results will improve Outcome: Completed/Met Goal: Respiratory complications will improve Outcome: Completed/Met Goal: Cardiovascular complication will be avoided Outcome: Completed/Met   Problem: Activity: Goal: Risk for activity intolerance will decrease Outcome: Completed/Met   Problem: Nutrition: Goal: Adequate nutrition will be maintained Outcome: Completed/Met   Problem: Coping: Goal: Level of anxiety will decrease Outcome: Completed/Met   Problem: Elimination: Goal: Will not experience complications related to bowel motility Outcome: Completed/Met Goal: Will not experience complications related to urinary retention Outcome: Completed/Met   Problem: Pain Managment: Goal: General experience of comfort will improve Outcome: Completed/Met   Problem: Safety: Goal: Ability to remain free from injury will improve Outcome: Completed/Met   Problem: Skin Integrity: Goal: Risk for impaired skin integrity will decrease Outcome: Completed/Met   Problem: Education: Goal: Understanding of CV disease, CV risk reduction, and recovery process will improve Outcome: Completed/Met Goal: Individualized Educational Video(s) Outcome: Completed/Met   Problem: Activity: Goal: Ability to return to baseline activity level will improve Outcome: Completed/Met   Problem: Cardiovascular: Goal: Ability to achieve and maintain  adequate cardiovascular perfusion will improve Outcome: Completed/Met Goal: Vascular access site(s) Level 0-1 will be maintained Outcome: Completed/Met   Problem: Health Behavior/Discharge Planning: Goal: Ability to safely manage health-related needs after discharge will improve Outcome: Completed/Met   Problem: Clinical Measurements: Goal: Diagnostic test results will improve Outcome: Completed/Met   Problem: Elimination: Goal: Will not experience complications related to bowel motility Outcome: Completed/Met   Problem: Pain Managment: Goal: General experience of comfort will improve Outcome: Completed/Met   Problem: Activity: Goal: Ability to return to baseline activity level will improve Outcome: Completed/Met   Problem: Cardiovascular: Goal: Vascular access site(s) Level 0-1 will be maintained Outcome: Completed/Met

## 2022-12-23 NOTE — Care Management Important Message (Signed)
Important Message  Patient Details  Name: Jacqueline Rhodes MRN: [id number] Date of Birth: 1955-05-15   Important Message Given:  N/A - LOS <3 / Initial given by admissions     Olegario Messier A [name] 12/23/2022, 12:55 PM

## 2022-12-23 NOTE — Plan of Care (Signed)
  Problem: Clinical Measurements: Goal: Ability to maintain clinical measurements within normal limits will improve Outcome: Progressing Goal: Respiratory complications will improve Outcome: Progressing Goal: Cardiovascular complication will be avoided Outcome: Progressing   

## 2022-12-23 NOTE — Progress Notes (Signed)
Progress Note   Patient: Jacqueline Rhodes GNF:[id number] DOB: 05/21/1955 DOA: 12/20/2022     3 DOS: the patient was seen and examined on 12/23/2022   Brief hospital course: 67 year old female past medical history of COPD, CAD, GERD, hypertension, dyslipidemia, hypothyroidism, obstructive sleep apnea on CPAP, type 2 diabetes mellitus and peripheral vascular disease.  She presented with palpitations and a feeling of her heart jumping out of her chest.  She has been having episodes of angioedema on July 17 thought to be secondary to losartan that began on August 30 and again on August 8.  She took some Benadryl and EpiPen yesterday secondary to lip and tongue swelling.  She was admitted with a STEMI and had a stent placed in the LAD and angioplasty of a diagonal.  10/9.  Patient's lip and tongue swelling improved.  She does have an appointment with an allergist in early November.  Will send off a C1 esterase inhibitor.  Advise she can take Allegra and/or Zyrtec at home to try to prevent this. 10/10.  With the EF being on the lower side spironolactone was added to see if blood pressure pulse tolerate. 10/11.  Patient had chest pain overnight.  Troponin trending down.  Seen by cardiology.  Patient ambulated in the hallway in the afternoon and did feel little short of breath and chest pain and will watch overnight again.   Assessment and Plan: * STEMI involving left anterior descending coronary artery [facility]) The patient is status post PCI and stent of his mid LAD for a complete occlusion.  She continues to have a thrombosed diagonal branch that is likely the culprit for her residual chest pain that underwent PCI as well as mentioned above. Continue aspirin, Brilinta.  Also on pravastatin (secondary to statin intolerance) and Zetia.  Started spironolactone yesterday for EF of 40%.  Imdur increased to 120 mg daily with cardiology team today.  Patient will likely only take 60 mg at home.  Angioedema - The  patient was initially given 125 mg of IV Solu-Medrol, IV Pepcid and Benadryl. -C1 esterase inhibitor normal range.  Could be repeated episodes of angioedema secondary to being on losartan on July 17.  Sometimes we can get reactions up to 12 weeks after coming off the medication. -Has appointment with an allergist in November. -Advised taking Zyrtec and Allegra upon discharge and hold it a week before appointment with allergist. -Prednisone 10 mg daily.  Will give a tapering dose of prednisone upon going home.  Hypothyroidism Continue Synthroid.  Asthma, chronic Continue her albuterol inhaler.  Obesity, Class III, BMI 40-49.9 (morbid obesity) (HCC) BMI 41.83  GERD without esophagitis - We will continue  PPI therapy.  Rheumatoid arthritis (HCC) methotrexate and Cosentyx.  No current flare.        Subjective: Patient seen this morning.  Had chest pain this morning.  With ambulation did have slight chest pain this afternoon and some shortness of breath.  Patient wanted to be watched overnight.  Admitted with STEMI.  Physical Exam: Vitals:   12/23/22 0501 12/23/22 0617 12/23/22 0741 12/23/22 1136  BP: 130/76 (!) 148/51 (!) 137/54 111/60  Pulse: 60 (!) 51 (!) 57 (!) 55  Resp: 18  16 (!) 22  Temp: 97.6 F (36.4 C)  97.8 F (36.6 C) 98 F (36.7 C)  TempSrc: Oral  Oral Oral  SpO2: 97% 100% 100% 100%  Weight:      Height:       Physical Exam HENT:  Head: Normocephalic.     Mouth/Throat:     Pharynx: No oropharyngeal exudate.  Eyes:     General: Lids are normal.  Cardiovascular:     Rate and Rhythm: Normal rate and regular rhythm.     Heart sounds: S1 normal and S2 normal. Murmur heard.     Systolic murmur is present with a grade of 2/6.  Pulmonary:     Breath sounds: Examination of the right-lower field reveals decreased breath sounds. Examination of the left-lower field reveals decreased breath sounds. Decreased breath sounds present. No wheezing, rhonchi or rales.   Abdominal:     Palpations: Abdomen is soft.     Tenderness: There is no abdominal tenderness.  Musculoskeletal:     Right lower leg: No swelling.     Left lower leg: No swelling.  Skin:    General: Skin is warm.     Findings: No rash.  Neurological:     Mental Status: She is alert and oriented to person, place, and time.     Data Reviewed: Troponin at 7829, creatinine 0.91, CBC normal range  Family Communication: Updated husband on the phone  Disposition: Status is: Inpatient Remains inpatient appropriate because: Patient did have a little chest pain and shortness of breath with ambulating this afternoon.  Will watch overnight and reassess tomorrow morning.  Planned Discharge Destination: Home    Time spent: 35 minutes Case discussed with cardiology this morning  Author: Alford Highland, MD 12/23/2022 2:32 PM  For on call review [email or web address].

## 2022-12-24 DIAGNOSIS — T783XXS Angioneurotic edema, sequela: Secondary | ICD-10-CM | POA: Diagnosis not present

## 2022-12-24 DIAGNOSIS — I2102 ST elevation (STEMI) myocardial infarction involving left anterior descending coronary artery: Secondary | ICD-10-CM | POA: Diagnosis not present

## 2022-12-24 DIAGNOSIS — E039 Hypothyroidism, unspecified: Secondary | ICD-10-CM | POA: Diagnosis not present

## 2022-12-24 DIAGNOSIS — K219 Gastro-esophageal reflux disease without esophagitis: Secondary | ICD-10-CM | POA: Diagnosis not present

## 2022-12-24 DIAGNOSIS — E876 Hypokalemia: Secondary | ICD-10-CM | POA: Insufficient documentation

## 2022-12-24 NOTE — Discharge Summary (Signed)
Physician Discharge Summary   Patient: Jacqueline Rhodes MRN: [id number] DOB: Jul 21, 1955  Admit date:     12/20/2022  Discharge date: 12/24/22  Discharge Physician: Alford Highland   PCP: Patrice Paradise, MD   Recommendations at discharge:   Follow-up PCP 5 days Follow-up cardiac rehab Follow-up cardiology 1 to 2 weeks.  Discharge Diagnoses: Principal Problem:   STEMI involving left anterior descending coronary artery (HCC) Active Problems:   Angioedema   Hypothyroidism   Asthma, chronic   Rheumatoid arthritis (HCC)   GERD without esophagitis   Obesity, Class III, BMI 40-49.9 (morbid obesity) (HCC)   Hypokalemia  Resolved Problems:   * No resolved hospital problems. *  Hospital Course: 67 year old female past medical history of COPD, CAD, GERD, hypertension, dyslipidemia, hypothyroidism, obstructive sleep apnea on CPAP, type 2 diabetes mellitus and peripheral vascular disease.  She presented with palpitations and a feeling of her heart jumping out of her chest.  She has been having episodes of angioedema on July 17 thought to be secondary to losartan that began on August 30 and again on August 8.  She took some Benadryl and EpiPen yesterday secondary to lip and tongue swelling.  She was admitted with a STEMI and had a stent placed in the LAD and angioplasty of a diagonal.  10/9.  Patient's lip and tongue swelling improved.  She does have an appointment with an allergist in early November.  Will send off a C1 esterase inhibitor.  Advise she can take Allegra and/or Zyrtec at home to try to prevent this. 10/10.  With the EF being on the lower side spironolactone was added to see if blood pressure pulse tolerate. 10/11.  Patient had chest pain overnight.  Troponin trending down.  Seen by cardiology.  Patient ambulated in the hallway in the afternoon and did feel little short of breath and chest pain and will watch overnight again. 10/12.  No further chest pain.  Felt okay walking  back and forth to the bathroom.  Feels ready to go home today.   Assessment and Plan: * STEMI involving left anterior descending coronary artery [facility]) The patient is status post PCI and stent of his mid LAD for a complete occlusion.  She continues to have a thrombosed diagonal branch that is likely the culprit for her residual chest pain that underwent PCI as well as mentioned above. Continue aspirin, Brilinta.  Also on pravastatin (secondary to statin intolerance)(patient will likely not take pravastatin at home) and Zetia.  Started spironolactone for EF of 40%.  Imdur increased to 120 mg daily with cardiology team today.  (Patient will likely only take 60 mg at home).  Angioedema - The patient was initially given 125 mg of IV Solu-Medrol, IV Pepcid and Benadryl. -C1 esterase inhibitor normal range.  Could be repeated episodes of angioedema secondary to being on losartan on July 17.  Sometimes we can get reactions up to 12 weeks after coming off the medication. -Has appointment with an allergist in November. -Advised taking Zyrtec and Allegra upon discharge and hold it a week before appointment with allergist. -Prednisone 10 mg daily.  Will give a tapering dose of prednisone upon going home.  Hypothyroidism Continue Synthroid.  Asthma, chronic Continue her albuterol inhaler.  Hypokalemia Replaced during the hospital course.  Obesity, Class III, BMI 40-49.9 (morbid obesity) (HCC) BMI 40.83  GERD without esophagitis Continue  PPI therapy.  Rheumatoid arthritis (HCC) methotrexate and Cosentyx.  No current flare.  Consultants: Cardiology Procedures performed: Cardiac catheterization with stent Disposition: Home Diet recommendation:  Cardiac diet DISCHARGE MEDICATION: Allergies as of 12/24/2022       Reactions   Estrogens Other (See Comments)   Blood clot   Losartan Swelling   Tongue swelling   Codeine Other (See Comments)   Migraine   Flexeril  [cyclobenzaprine] Hives   Penicillins Hives   Has patient had a PCN reaction causing immediate rash, facial/tongue/throat swelling, SOB or lightheadedness with hypotension: yes Has patient had a PCN reaction causing severe rash involving mucus membranes or skin necrosis: no Has patient had a PCN reaction that required hospitalization:  no Has patient had a PCN reaction occurring within the last 10 years: no If all of the above answers are "NO", then may proceed with Cephalosporin use.   Terfenadine Hives   Vicodin [hydrocodone-acetaminophen] Hives        Medication List     STOP taking these medications    diphenhydrAMINE 25 mg capsule Commonly known as: BENADRYL   diphenhydrAMINE 50 MG tablet Commonly known as: BENADRYL   famotidine 20 MG tablet Commonly known as: PEPCID   hydrochlorothiazide 25 MG tablet Commonly known as: HYDRODIURIL       TAKE these medications    acetaminophen 500 MG tablet Commonly known as: TYLENOL Take 2 tablets (1,000 mg total) by mouth every 8 (eight) hours. What changed:  when to take this reasons to take this   albuterol 108 (90 Base) MCG/ACT inhaler Commonly known as: VENTOLIN HFA Inhale 2 puffs into the lungs every 6 (six) hours as needed for wheezing or shortness of breath.   aspirin 81 MG chewable tablet Chew 1 tablet (81 mg total) by mouth daily.   Breo Ellipta 100-25 MCG/INH Aepb Generic drug: fluticasone furoate-vilanterol Inhale 1 puff into the lungs daily.   Brilinta 90 MG Tabs tablet Generic drug: ticagrelor Take 1 tablet (90 mg total) by mouth 2 (two) times daily.   Calcium Fructoborate Powd 20 mcg by Does not apply route. Cream   Cetirizine HCl 10 MG Caps Take 10 mg by mouth daily.   Cholecalciferol 25 MCG (1000 UT) Tbdp Take 125 mcg by mouth.   CoQ-10 100 MG Caps Take 100 mg by mouth daily.   Cosentyx Sensoready (300 MG) 150 MG/ML Soaj Generic drug: Secukinumab Inject 300 mg into the skin every 28  (twenty-eight) days.   ezetimibe 10 MG tablet Commonly known as: ZETIA Take 1 tablet (10 mg total) by mouth daily.   folic acid 1 MG tablet Commonly known as: FOLVITE Take 1 mg by mouth daily.   furosemide 20 MG tablet Commonly known as: LASIX Take 20 mg by mouth daily as needed for fluid or edema.   isosorbide mononitrate 60 MG 24 hr tablet Commonly known as: IMDUR Take 2 tablets (120 mg total) by mouth daily. What changed: how much to take   levothyroxine 150 MCG tablet Commonly known as: SYNTHROID Take 150 mcg by mouth daily before breakfast.   meclizine 25 MG tablet Commonly known as: ANTIVERT Take 25 mg by mouth 3 (three) times daily as needed for dizziness.   methotrexate 2.5 MG tablet Commonly known as: RHEUMATREX Take 20 mg by mouth every Thursday.   metoprolol succinate 25 MG 24 hr tablet Commonly known as: TOPROL-XL Take 1 tablet (25 mg total) by mouth 2 (two) times daily.   nitroGLYCERIN 0.4 MG SL tablet Commonly known as: NITROSTAT Place 1 tablet (0.4 mg total) under the tongue once every  5 (five) minutes as needed for chest pain. (Max of 3 doses in 15 minutes).   pantoprazole 40 MG tablet Commonly known as: PROTONIX Take 40 mg by mouth 2 (two) times daily.   pravastatin 20 MG tablet Commonly known as: PRAVACHOL Take 1 tablet (20 mg total) by mouth daily.   predniSONE 10 MG tablet Commonly known as: DELTASONE Take 1 tablet (10mg  total) by mouth once daily for 5 days. Then take 0.5 tablet (5mg  total) by mouth once daily for 6 days. Start taking on: December 24, 2022   spironolactone 25 MG tablet Commonly known as: ALDACTONE Take 0.5 tablet (12.5 mg total) by mouth daily.        Follow-up Information     Tiajuana Amass, MD. Go in 1 week(s).   Specialty: Cardiology Why: OR Dr. Jolayne Haines information: [address] [place] [address] [phone]         Patrice Paradise, MD Follow up in 5 day(s).   Specialty:  Physician Assistant Contact information: [phone] [facility] [address] [facility] [place] [address] [phone]                Discharge Exam: Ceasar Mons Weights   12/20/22 2051 12/22/22 0600  Weight: 110.2 kg 107.9 kg   Physical Exam HENT:     Head: Normocephalic.     Mouth/Throat:     Pharynx: No oropharyngeal exudate.  Eyes:     General: Lids are normal.  Cardiovascular:     Rate and Rhythm: Normal rate and regular rhythm.     Heart sounds: S1 normal and S2 normal. Murmur heard.     Systolic murmur is present with a grade of 2/6.  Pulmonary:     Breath sounds: Examination of the right-lower field reveals decreased breath sounds. Examination of the left-lower field reveals decreased breath sounds. Decreased breath sounds present. No wheezing, rhonchi or rales.  Abdominal:     Palpations: Abdomen is soft.     Tenderness: There is no abdominal tenderness.  Musculoskeletal:     Right lower leg: No swelling.     Left lower leg: No swelling.  Skin:    General: Skin is warm.     Findings: No rash.  Neurological:     Mental Status: She is alert and oriented to person, place, and time.      Condition at discharge: stable  The results of significant diagnostics from this hospitalization (including imaging, microbiology, ancillary and laboratory) are listed below for reference.   Imaging Studies: CARDIAC CATHETERIZATION  Addendum Date: 12/23/2022   Conclusions: Severe single-vessel coronary artery disease with thrombotic occlusion of the mid LAD involving moderate-caliber D2 branch.  There is mild, nonobstructive CAD involving the LCx and RCA. Moderately to severely reduced left ventricular systolic function (LVEF 30-35%) with mid and apical anterior akinesis. Moderately elevated left ventricular filling pressure (LVEDP 25 mmHg) consistent with acute systolic heart failure. Successful PCI to mid LAD using Onyx Frontier 2.5 x 18 mm drug-eluting stent with 0% residual  stenosis and TIMI-3 flow.  Jailed diagonal branch was also angioplastied with improvement in ostial stenosis from 70%->50% and reestablishment of TIMI I flow in the mid/distal vessel. Recommendations: Continue cangrelor infusion for 2 hours. Dual antiplatelet therapy with aspirin and ticagrelor for at least 12 months. Titrate nitroglycerin for relief of chest pain. Aggressive secondary prevention of coronary artery disease. Further evaluation and management of angioedema per primary team. Gentle diuresis and initiation of goal-directed medical therapy as tolerated. Obtain  echocardiogram.  If LVEF remains less than 35%, consider LifeVest at discharge. Yvonne Kendall, MD [facility]   Result Date: 12/23/2022 Conclusions: Severe single-vessel coronary artery disease with thrombotic occlusion of the mid LAD involving moderate-caliber D2 branch.  There is mild, nonobstructive CAD involving the LCx and RCA. Moderately to severely reduced left ventricular systolic function (LVEF 30-35%) with mid and apical anterior akinesis. Moderately elevated left ventricular filling pressure (LVEDP 25 mmHg). Successful PCI to mid LAD using Onyx Frontier 2.5 x 18 mm drug-eluting stent with 0% residual stenosis and TIMI-3 flow.  Jailed diagonal branch was also angioplastied with improvement in ostial stenosis from 70%->50% and reestablishment of TIMI I flow in the mid/distal vessel. Recommendations: Continue cangrelor infusion for 2 hours. Dual antiplatelet therapy with aspirin and ticagrelor for at least 12 months. Titrate nitroglycerin for relief of chest pain. Aggressive secondary prevention of coronary artery disease. Further evaluation and management of angioedema per primary team. Gentle diuresis and initiation of goal-directed medical therapy as tolerated. Obtain echocardiogram.  If LVEF remains less than 35%, consider LifeVest at discharge. Yvonne Kendall, MD [facility]  ECHOCARDIOGRAM COMPLETE  Result Date:  12/22/2022    ECHOCARDIOGRAM REPORT   Patient Name:   Jacqueline Rhodes Date of Exam: 12/22/2022 Medical Rec #:  [id number]         Height:       64.0 in Accession #:    [id number]        Weight:       237.9 lb Date of Birth:  March 01, 1956          BSA:          2.106 m Patient Age:    67 years          BP:           119/54 mmHg Patient Gender: F                 HR:           76 bpm. Exam Location:  [facility] Procedure: 2D Echo, 3D Echo, Cardiac Doppler, Color Doppler, Strain Analysis and            Intracardiac Opacification Agent Indications:     Acute MI  History:         Patient has no prior history of Echocardiogram examinations.                  Acute MI, Angina and CAD; Risk Factors:Hypertension and                  Diabetes.  Sonographer:     Mikki Harbor Referring Phys:  [phone] CHRISTOPHER END Diagnosing Phys: Marcina Millard MD  Sonographer Comments: Patient is obese. IMPRESSIONS  1. Left ventricular ejection fraction, by estimation, is 40 to 45%. The left ventricle has mildly decreased function. The left ventricle demonstrates regional wall motion abnormalities (see scoring diagram/findings for description). Left ventricular diastolic parameters were normal.  2. Right ventricular systolic function is normal. The right ventricular size is normal.  3. The mitral valve is normal in structure. Mild mitral valve regurgitation. No evidence of mitral stenosis.  4. The aortic valve is normal in structure. Aortic valve regurgitation is mild to moderate. No aortic stenosis is present.  5. The inferior vena cava is normal in size with greater than 50% respiratory variability, suggesting right atrial pressure of 3 mmHg. FINDINGS  Left Ventricle: Left ventricular ejection fraction, by estimation, is 40 to 45%. The  left ventricle has mildly decreased function. The left ventricle demonstrates regional wall motion abnormalities. Definity contrast agent was given IV to delineate the left ventricular endocardial borders. The  left ventricular internal cavity size was normal in size. There is no left ventricular hypertrophy. Left ventricular diastolic parameters were normal.  LV Wall Scoring: The apical lateral segment, apical septal segment, and apex are hypokinetic. Right Ventricle: The right ventricular size is normal. No increase in right ventricular wall thickness. Right ventricular systolic function is normal. Left Atrium: Left atrial size was normal in size. Right Atrium: Right atrial size was normal in size. Pericardium: There is no evidence of pericardial effusion. Mitral Valve: The mitral valve is normal in structure. Mild mitral valve regurgitation. No evidence of mitral valve stenosis. MV peak gradient, 8.5 mmHg. The mean mitral valve gradient is 3.0 mmHg. Tricuspid Valve: The tricuspid valve is normal in structure. Tricuspid valve regurgitation is mild . No evidence of tricuspid stenosis. Aortic Valve: The aortic valve is normal in structure. Aortic valve regurgitation is mild to moderate. Aortic regurgitation PHT measures 411 msec. No aortic stenosis is present. Aortic valve mean gradient measures 5.0 mmHg. Aortic valve peak gradient measures 11.4 mmHg. Aortic valve area, by VTI measures 2.26 cm. Pulmonic Valve: The pulmonic valve was normal in structure. Pulmonic valve regurgitation is not visualized. No evidence of pulmonic stenosis. Aorta: The aortic root is normal in size and structure. Venous: The inferior vena cava is normal in size with greater than 50% respiratory variability, suggesting right atrial pressure of 3 mmHg. IAS/Shunts: No atrial level shunt detected by color flow Doppler.  LEFT VENTRICLE PLAX 2D LVIDd:         4.60 cm     Diastology LVIDs:         3.60 cm     LV e' medial:    6.53 cm/s LV PW:         1.30 cm     LV E/e' medial:  15.9 LV IVS:        1.20 cm     LV e' lateral:   6.31 cm/s LVOT diam:     2.00 cm     LV E/e' lateral: 16.5 LV SV:         92 LV SV Index:   44 LVOT Area:     3.14 cm  LV  Volumes (MOD) LV vol d, MOD A4C: 90.3 ml LV vol s, MOD A4C: 38.7 ml LV SV MOD A4C:     90.3 ml RIGHT VENTRICLE RV Basal diam:  3.00 cm RV Mid diam:    2.80 cm RV S prime:     14.00 cm/s TAPSE (M-mode): 2.2 cm LEFT ATRIUM             Index        RIGHT ATRIUM           Index LA diam:        4.60 cm 2.18 cm/m   RA Area:     14.60 cm LA Vol (A2C):   77.8 ml 36.94 ml/m  RA Volume:   32.60 ml  15.48 ml/m LA Vol (A4C):   77.1 ml 36.61 ml/m LA Biplane Vol: 80.7 ml 38.32 ml/m  AORTIC VALVE                     PULMONIC VALVE AV Area (Vmax):    2.14 cm      PV Vmax:  1.21 m/s AV Area (Vmean):   2.29 cm      PV Peak grad:  5.9 mmHg AV Area (VTI):     2.26 cm AV Vmax:           169.00 cm/s AV Vmean:          107.000 cm/s AV VTI:            0.407 m AV Peak Grad:      11.4 mmHg AV Mean Grad:      5.0 mmHg LVOT Vmax:         115.00 cm/s LVOT Vmean:        78.100 cm/s LVOT VTI:          0.293 m LVOT/AV VTI ratio: 0.72 AI PHT:            411 msec  AORTA Ao Root diam: 2.80 cm Ao Asc diam:  2.90 cm MITRAL VALVE MV Area (PHT): 3.45 cm     SHUNTS MV Area VTI:   1.80 cm     Systemic VTI:  0.29 m MV Peak grad:  8.5 mmHg     Systemic Diam: 2.00 cm MV Mean grad:  3.0 mmHg MV Vmax:       1.46 m/s MV Vmean:      74.1 cm/s MV Decel Time: 220 msec MV E velocity: 104.00 cm/s MV A velocity: 90.10 cm/s MV E/A ratio:  1.15 Marcina Millard MD Electronically signed by Marcina Millard MD Signature Date/Time: 12/22/2022/1:40:44 PM    Final    DG Chest Portable 1 View  Result Date: 12/20/2022 CLINICAL DATA:  Chest pain. EXAM: PORTABLE CHEST 1 VIEW COMPARISON:  CT 05/25/2016 FINDINGS: The heart is borderline enlarged. Aortic atherosclerosis. Peribronchial thickening without focal airspace disease. No large pleural effusion or pneumothorax. No acute osseous findings. IMPRESSION: 1. Peribronchial thickening without focal airspace disease. 2. Borderline cardiomegaly. Electronically Signed   By: Narda Rutherford M.D.   On:  12/20/2022 20:50    Microbiology: Results for orders placed or performed during the hospital encounter of 12/20/22  MRSA Next Gen by PCR, Nasal     Status: None   Collection Time: 12/20/22 11:28 PM   Specimen: Nasal Mucosa; Nasal Swab  Result Value Ref Range Status   MRSA by PCR Next Gen NOT DETECTED NOT DETECTED Final    Comment: (NOTE) The GeneXpert MRSA Assay (FDA approved for NASAL specimens only), is one component of a comprehensive MRSA colonization surveillance program. It is not intended to diagnose MRSA infection nor to guide or monitor treatment for MRSA infections. Test performance is not FDA approved in patients less than 67 years old. Performed at [facility], [address] [place], [place] [address]     Labs: CBC: Recent Labs  Lab 12/20/22 2036 12/21/22 0414 12/23/22 0553  WBC 11.5* 9.2 9.0  NEUTROABS 4.9  --   --   HGB 13.9 13.2 12.4  HCT 41.6 38.6 38.2  MCV 88.1 86.0 90.3  PLT 334 273 228   Basic Metabolic Panel: Recent Labs  Lab 12/20/22 2036 12/21/22 0414 12/22/22 0445 12/23/22 0553  NA 136 134* 135 139  K 2.7* 3.4* 3.9 3.9  CL 100 98 101 104  CO2 26 23 24 26   GLUCOSE 188* 245* 161* 124*  BUN 22 23 29* 29*  CREATININE 1.15* 1.06* 0.87 0.91  CALCIUM 9.1 8.8* 8.7* 8.9   Liver Function Tests: Recent Labs  Lab 12/20/22 2036  AST 17  ALT 15  ALKPHOS 70  BILITOT 0.7  PROT 7.1  ALBUMIN 3.7   CBG: Recent Labs  Lab 12/20/22 2303  GLUCAP 203*    Discharge time spent: greater than 30 minutes.  Signed: Alford Highland, MD [facility] 12/24/2022

## 2022-12-24 NOTE — Progress Notes (Signed)
Transition of Care [facility]) - Inpatient Brief Assessment   Patient Details  Name: Jacqueline Rhodes MRN: [id number] Date of Birth: June 15, 1955  Transition of Care [facility]) CM/SW Contact:    Bing Quarry, RN Phone Number: 12/24/2022, 9:10 AM   Clinical Narrative:12/24/22 Patient has discharge orders to home self care today. No TOC need identified at this time. Please contact TOC inpatient if needs arise prior to discharge. New medications printed on AVS. Patient has PCP and insurance. Medications changes. additions, and discontinuations noted in AVS. Follow up referral and appointments also listed below.   PCP: Maurine Minister [phone] Follow up in 5 days.  Specialist:  Tiajuana Amass, Cardiologist in 1 week. [phone].   Ambulatory Referrals AMB Referral to Cardiac Rehabilitation - Phase II Complete by: As directed  [facility]  [phone]        Gabriel Cirri MSN RN CM  Transitions of Care Department [facility] [phone] Weekends Only   Transition of Care Asessment: Insurance and Status: Insurance coverage has been reviewed Patient has primary care physician: Yes Home environment has been reviewed: Single Family home.   Prior/Current Home Services: No current home services Social Determinants of Health Reivew: SDOH reviewed no interventions necessary Readmission risk has been reviewed: Yes Transition of care needs: no transition of care needs at this time

## 2022-12-24 NOTE — Assessment & Plan Note (Signed)
Replaced during the hospital course

## 2022-12-24 NOTE — Discharge Instructions (Signed)
Keep appointment with allergist.

## 2022-12-27 DIAGNOSIS — R519 Headache, unspecified: Secondary | ICD-10-CM | POA: Diagnosis not present

## 2022-12-27 DIAGNOSIS — T783XXS Angioneurotic edema, sequela: Secondary | ICD-10-CM | POA: Diagnosis not present

## 2022-12-27 DIAGNOSIS — R0602 Shortness of breath: Secondary | ICD-10-CM | POA: Diagnosis not present

## 2022-12-27 DIAGNOSIS — R197 Diarrhea, unspecified: Secondary | ICD-10-CM | POA: Diagnosis not present

## 2022-12-27 DIAGNOSIS — G4733 Obstructive sleep apnea (adult) (pediatric): Secondary | ICD-10-CM | POA: Diagnosis not present

## 2022-12-27 DIAGNOSIS — I2510 Atherosclerotic heart disease of native coronary artery without angina pectoris: Secondary | ICD-10-CM | POA: Diagnosis not present

## 2022-12-27 DIAGNOSIS — Z6841 Body Mass Index (BMI) 40.0 and over, adult: Secondary | ICD-10-CM | POA: Diagnosis not present

## 2022-12-30 DIAGNOSIS — J439 Emphysema, unspecified: Secondary | ICD-10-CM | POA: Diagnosis not present

## 2022-12-30 DIAGNOSIS — E119 Type 2 diabetes mellitus without complications: Secondary | ICD-10-CM | POA: Diagnosis not present

## 2022-12-30 DIAGNOSIS — E782 Mixed hyperlipidemia: Secondary | ICD-10-CM | POA: Diagnosis not present

## 2022-12-30 DIAGNOSIS — E66813 Obesity, class 3: Secondary | ICD-10-CM | POA: Diagnosis not present

## 2022-12-30 DIAGNOSIS — I252 Old myocardial infarction: Secondary | ICD-10-CM | POA: Diagnosis not present

## 2022-12-30 DIAGNOSIS — I10 Essential (primary) hypertension: Secondary | ICD-10-CM | POA: Diagnosis not present

## 2022-12-30 DIAGNOSIS — I890 Lymphedema, not elsewhere classified: Secondary | ICD-10-CM | POA: Diagnosis not present

## 2022-12-30 DIAGNOSIS — I872 Venous insufficiency (chronic) (peripheral): Secondary | ICD-10-CM | POA: Diagnosis not present

## 2022-12-30 DIAGNOSIS — M7581 Other shoulder lesions, right shoulder: Secondary | ICD-10-CM | POA: Diagnosis not present

## 2022-12-30 DIAGNOSIS — G4733 Obstructive sleep apnea (adult) (pediatric): Secondary | ICD-10-CM | POA: Diagnosis not present

## 2022-12-30 DIAGNOSIS — Z23 Encounter for immunization: Secondary | ICD-10-CM | POA: Diagnosis not present

## 2022-12-30 DIAGNOSIS — I2510 Atherosclerotic heart disease of native coronary artery without angina pectoris: Secondary | ICD-10-CM | POA: Diagnosis not present

## 2022-12-30 DIAGNOSIS — M75121 Complete rotator cuff tear or rupture of right shoulder, not specified as traumatic: Secondary | ICD-10-CM | POA: Diagnosis not present

## 2022-12-30 DIAGNOSIS — Z9889 Other specified postprocedural states: Secondary | ICD-10-CM | POA: Diagnosis not present

## 2023-01-06 NOTE — Plan of Care (Signed)
 [facility] Tonsillectomy/Adenoidectomy, Postoperative PEDS care plan entered in error.

## 2023-01-12 ENCOUNTER — Encounter: Payer: PPO | Attending: Cardiovascular Disease | Admitting: *Deleted

## 2023-01-12 ENCOUNTER — Encounter: Payer: Self-pay | Admitting: *Deleted

## 2023-01-12 DIAGNOSIS — Z955 Presence of coronary angioplasty implant and graft: Secondary | ICD-10-CM

## 2023-01-12 DIAGNOSIS — I213 ST elevation (STEMI) myocardial infarction of unspecified site: Secondary | ICD-10-CM

## 2023-01-12 NOTE — Progress Notes (Signed)
Virtual orientation call completed today. shehas an appointment on Date: 01/18/2023  for EP eval and gym Orientation.  Documentation of diagnosis can be found in [facility] Date: 12/20/2022 .

## 2023-01-13 DIAGNOSIS — J300 Vasomotor rhinitis: Secondary | ICD-10-CM | POA: Diagnosis not present

## 2023-01-13 DIAGNOSIS — J4540 Moderate persistent asthma, uncomplicated: Secondary | ICD-10-CM | POA: Diagnosis not present

## 2023-01-13 DIAGNOSIS — T783XXD Angioneurotic edema, subsequent encounter: Secondary | ICD-10-CM | POA: Diagnosis not present

## 2023-01-16 DIAGNOSIS — T783XXA Angioneurotic edema, initial encounter: Secondary | ICD-10-CM | POA: Diagnosis not present

## 2023-01-18 ENCOUNTER — Encounter: Payer: PPO | Attending: Cardiovascular Disease

## 2023-01-18 VITALS — Ht 65.5 in | Wt 245.2 lb

## 2023-01-18 DIAGNOSIS — I213 ST elevation (STEMI) myocardial infarction of unspecified site: Secondary | ICD-10-CM | POA: Diagnosis not present

## 2023-01-18 DIAGNOSIS — Z955 Presence of coronary angioplasty implant and graft: Secondary | ICD-10-CM | POA: Insufficient documentation

## 2023-01-18 NOTE — Patient Instructions (Signed)
Patient Instructions  Patient Details  Name: Jacqueline Rhodes MRN: [id number] Date of Birth: Mar 29, 1955 Referring Provider:  Tiajuana Amass, MD  Below are your personal goals for exercise, nutrition, and risk factors. Our goal is to help you stay on track towards obtaining and maintaining these goals. We will be discussing your progress on these goals with you throughout the program.  Initial Exercise Prescription:  Initial Exercise Prescription - 01/18/23 1100       Date of Initial Exercise RX and Referring Provider   Date 01/18/23    Referring Provider Tiajuana Amass, MD      Oxygen   Maintain Oxygen Saturation 88% or higher      Recumbant Bike   Level 1    RPM 50    Watts 15    Minutes 15    METs 1      NuStep   Level 1    SPM 80    Minutes 15    METs 1      Biostep-RELP   Level 1    SPM 50    Minutes 15    METs 1      Track   Laps 10    Minutes 15    METs 1.54      Prescription Details   Frequency (times per week) 2    Duration Progress to 30 minutes of continuous aerobic without signs/symptoms of physical distress      Intensity   THRR 40-80% of Max Heartrate 108-136    Ratings of Perceived Exertion 11-13    Perceived Dyspnea 0-4      Progression   Progression Continue to progress workloads to maintain intensity without signs/symptoms of physical distress.      Resistance Training   Training Prescription Yes    Weight 2 lb    Reps 10-15             Exercise Goals: Frequency: Be able to perform aerobic exercise two to three times per week in program working toward 2-5 days per week of home exercise.  Intensity: Work with a perceived exertion of 11 (fairly light) - 15 (hard) while following your exercise prescription.  We will make changes to your prescription with you as you progress through the program.   Duration: Be able to do 30 to 45 minutes of continuous aerobic exercise in addition to a 5 minute warm-up and a 5 minute cool-down  routine.   Nutrition Goals: Your personal nutrition goals will be established when you do your nutrition analysis with the dietician.  The following are general nutrition guidelines to follow: Cholesterol < 200mg /day Sodium < 1500mg /day Fiber: Women over 50 yrs - 21 grams per day  Personal Goals:  Personal Goals and Risk Factors at Admission - 01/18/23 1110       Core Components/Risk Factors/Patient Goals on Admission    Weight Management Yes;Weight Loss    Intervention Weight Management: Develop a combined nutrition and exercise program designed to reach desired caloric intake, while maintaining appropriate intake of nutrient and fiber, sodium and fats, and appropriate energy expenditure required for the weight goal.;Weight Management: Provide education and appropriate resources to help participant work on and attain dietary goals.;Weight Management/Obesity: Establish reasonable short term and long term weight goals.    Admit Weight 245 lb 3.2 oz (111.2 kg)    Goal Weight: Short Term 240 lb (108.9 kg)    Goal Weight: Long Term 180 lb (81.6 kg)    Expected Outcomes Short  Term: Continue to assess and modify interventions until short term weight is achieved;Long Term: Adherence to nutrition and physical activity/exercise program aimed toward attainment of established weight goal;Weight Loss: Understanding of general recommendations for a balanced deficit meal plan, which promotes 1-2 lb weight loss per week and includes a negative energy balance of [id number] kcal/d;Understanding recommendations for meals to include 15-35% energy as protein, 25-35% energy from fat, 35-60% energy from carbohydrates, less than 200mg  of dietary cholesterol, 20-35 gm of total fiber daily;Understanding of distribution of calorie intake throughout the day with the consumption of 4-5 meals/snacks    Hypertension Yes    Intervention Provide education on lifestyle modifcations including regular physical activity/exercise,  weight management, moderate sodium restriction and increased consumption of fresh fruit, vegetables, and low fat dairy, alcohol moderation, and smoking cessation.;Monitor prescription use compliance.    Expected Outcomes Short Term: Continued assessment and intervention until BP is < 140/72mm HG in hypertensive participants. < 130/3mm HG in hypertensive participants with diabetes, heart failure or chronic kidney disease.;Long Term: Maintenance of blood pressure at goal levels.    Lipids Yes    Intervention Provide education and support for participant on nutrition & aerobic/resistive exercise along with prescribed medications to achieve LDL 70mg , HDL >40mg .    Expected Outcomes Short Term: Participant states understanding of desired cholesterol values and is compliant with medications prescribed. Participant is following exercise prescription and nutrition guidelines.;Long Term: Cholesterol controlled with medications as prescribed, with individualized exercise RX and with personalized nutrition plan. Value goals: LDL < 70mg , HDL > 40 mg.             Tobacco Use Initial Evaluation: Social History   Tobacco Use  Smoking Status Former   Current packs/day: 0.00   Average packs/day: 0.5 packs/day for 30.0 years (15.0 ttl pk-yrs)   Types: Cigarettes   Start date: 67   Quit date: 2005   Years since quitting: 19.8  Smokeless Tobacco Never    Exercise Goals and Review:  Exercise Goals     Row Name 01/18/23 1106             Exercise Goals   Increase Physical Activity Yes       Intervention Provide advice, education, support and counseling about physical activity/exercise needs.;Develop an individualized exercise prescription for aerobic and resistive training based on initial evaluation findings, risk stratification, comorbidities and participant's personal goals.       Expected Outcomes Short Term: Attend rehab on a regular basis to increase amount of physical activity.;Long Term:  Add in home exercise to make exercise part of routine and to increase amount of physical activity.;Long Term: Exercising regularly at least 3-5 days a week.       Increase Strength and Stamina Yes       Intervention Provide advice, education, support and counseling about physical activity/exercise needs.;Develop an individualized exercise prescription for aerobic and resistive training based on initial evaluation findings, risk stratification, comorbidities and participant's personal goals.       Expected Outcomes Short Term: Increase workloads from initial exercise prescription for resistance, speed, and METs.;Short Term: Perform resistance training exercises routinely during rehab and add in resistance training at home;Long Term: Improve cardiorespiratory fitness, muscular endurance and strength as measured by increased METs and functional capacity ( )       Able to understand and use rate of perceived exertion (RPE) scale Yes       Intervention Provide education and explanation on how to use RPE scale  Expected Outcomes Short Term: Able to use RPE daily in rehab to express subjective intensity level;Long Term:  Able to use RPE to guide intensity level when exercising independently       Able to understand and use Dyspnea scale Yes       Intervention Provide education and explanation on how to use Dyspnea scale       Expected Outcomes Short Term: Able to use Dyspnea scale daily in rehab to express subjective sense of shortness of breath during exertion;Long Term: Able to use Dyspnea scale to guide intensity level when exercising independently       Knowledge and understanding of Target Heart Rate Range (THRR) Yes       Intervention Provide education and explanation of THRR including how the numbers were predicted and where they are located for reference       Expected Outcomes Short Term: Able to state/look up THRR;Short Term: Able to use daily as guideline for intensity in rehab;Long Term:  Able to use THRR to govern intensity when exercising independently       Able to check pulse independently Yes       Intervention Provide education and demonstration on how to check pulse in carotid and radial arteries.;Review the importance of being able to check your own pulse for safety during independent exercise       Expected Outcomes Short Term: Able to explain why pulse checking is important during independent exercise;Long Term: Able to check pulse independently and accurately       Understanding of Exercise Prescription Yes       Intervention Provide education, explanation, and written materials on patient's individual exercise prescription       Expected Outcomes Short Term: Able to explain program exercise prescription;Long Term: Able to explain home exercise prescription to exercise independently

## 2023-01-18 NOTE — Progress Notes (Signed)
Cardiac Individual Treatment Plan  Patient Details  Name: Jacqueline Rhodes MRN: [id number] Date of Birth: [birth year] Referring Provider:   Flowsheet Row Cardiac Rehab from 01/18/2023 in [facility] Cardiac and Pulmonary Rehab  Referring Provider Tiajuana Amass, MD       Initial Encounter Date:  Flowsheet Row Cardiac Rehab from 01/18/2023 in [facility] Cardiac and Pulmonary Rehab  Date 01/18/23       Visit Diagnosis: ST elevation myocardial infarction (STEMI), unspecified artery [facility])  Status post coronary artery stent placement  Patient's Home Medications on Admission:  Current Outpatient Medications:    acetaminophen (TYLENOL) 500 MG tablet, Take 2 tablets (1,000 mg total) by mouth every 8 (eight) hours. (Patient taking differently: Take 1,000 mg by mouth every 8 (eight) hours as needed for mild pain (pain score 1-3), moderate pain (pain score 4-6), fever or headache.), Disp: 90 tablet, Rfl: 2   albuterol (PROVENTIL HFA;VENTOLIN HFA) 108 (90 Base) MCG/ACT inhaler, Inhale 2 puffs into the lungs every 6 (six) hours as needed for wheezing or shortness of breath. , Disp: , Rfl:    aspirin 81 MG chewable tablet, Chew 1 tablet (81 mg total) by mouth daily., Disp: 30 tablet, Rfl: 0   Calcium Fructoborate POWD, 20 mcg by Does not apply route. Cream, Disp: , Rfl:    Cetirizine HCl 10 MG CAPS, Take 10 mg by mouth daily. , Disp: , Rfl:    Cholecalciferol 25 MCG (1000 UT) TBDP, Take 125 mcg by mouth., Disp: , Rfl:    Coenzyme Q10 (COQ-10) 100 MG CAPS, Take 100 mg by mouth daily. , Disp: , Rfl:    ezetimibe (ZETIA) 10 MG tablet, Take 1 tablet (10 mg total) by mouth daily., Disp: 30 tablet, Rfl: 0   fluticasone furoate-vilanterol (BREO ELLIPTA) 100-25 MCG/INH AEPB, Inhale 1 puff into the lungs daily. , Disp: , Rfl:    folic acid (FOLVITE) 1 MG tablet, Take 1 mg by mouth daily., Disp: , Rfl:    furosemide (LASIX) 20 MG tablet, Take 20 mg by mouth daily as needed for fluid or edema., Disp: , Rfl:     isosorbide mononitrate (IMDUR) 60 MG 24 hr tablet, Take 2 tablets (120 mg total) by mouth daily., Disp: 60 tablet, Rfl: 0   levothyroxine (SYNTHROID) 150 MCG tablet, Take 150 mcg by mouth daily before breakfast., Disp: , Rfl:    meclizine (ANTIVERT) 25 MG tablet, Take 25 mg by mouth 3 (three) times daily as needed for dizziness., Disp: , Rfl:    methotrexate (RHEUMATREX) 2.5 MG tablet, Take 20 mg by mouth every Thursday. , Disp: , Rfl:    metoprolol succinate (TOPROL-XL) 25 MG 24 hr tablet, Take 1 tablet (25 mg total) by mouth 2 (two) times daily., Disp: 60 tablet, Rfl: 0   nitroGLYCERIN (NITROSTAT) 0.4 MG SL tablet, Place 1 tablet (0.4 mg total) under the tongue once every 5 (five) minutes as needed for chest pain. (Max of 3 doses in 15 minutes)., Disp: 25 tablet, Rfl: 0   pantoprazole (PROTONIX) 40 MG tablet, Take 40 mg by mouth 2 (two) times daily., Disp: , Rfl:    pravastatin (PRAVACHOL) 20 MG tablet, Take 1 tablet (20 mg total) by mouth daily., Disp: 30 tablet, Rfl: 0   Secukinumab (COSENTYX SENSOREADY 300 DOSE) 150 MG/ML SOAJ, Inject 300 mg into the skin every 28 (twenty-eight) days.  (Patient not taking: Reported on 01/12/2023), Disp: , Rfl:    spironolactone (ALDACTONE) 25 MG tablet, Take 0.5 tablet (12.5 mg total) by mouth  daily., Disp: 15 tablet, Rfl: 0   ticagrelor (BRILINTA) 90 MG TABS tablet, Take 1 tablet (90 mg total) by mouth 2 (two) times daily., Disp: 60 tablet, Rfl: 0   UNABLE TO FIND, Take 1.5 drops by mouth daily at 2 PM. Med Name: B12  5,000 unit, B6 3400 unit B1  and Niacin, Disp: , Rfl:   Past Medical History: Past Medical History:  Diagnosis Date   Anemia    Anginal pain (HCC)    Aortic atherosclerosis (HCC)    Asthma    B12 deficiency    Carpal tunnel syndrome    CHF (congestive heart failure) (HCC)    Chronic venous insufficiency    Complete tear of right rotator cuff    COPD (chronic obstructive pulmonary disease) (HCC)    Coronary artery disease 01/07/2005    a.) LHC 01/07/2005 (NSTEMI): LHC 60% pLAD, 70% D1, 30% pLCx, 20% pRCA, 25% dRCA - med mgmt; b.) LHC ([place]) 01/10/2008: 25/60% pLAD, 70% D1, 20% pLCx, 20% mRCA --> FFR normal --> med mgmt; c.) LHC ([place]) 01/04/2012:: 40/60% pLAD, 70% D1, 25% pLCx, 20% pRCA, 20% mRCA, 25% dRCA --> FFR normal --> med mgmt; d.) LHC ([place]) 01/10/2018: 20% pRCA, 50% pLAD, 55% oD1 -- med mgmt   Diverticulosis    DOE (dyspnea on exertion)    GERD (gastroesophageal reflux disease)    History of bilateral cataract extraction 2014   Hyperlipidemia    Hypertension    Hypothyroidism    a.) s/p LEFT hemithyroidectomy; on levothyroxine   Long term current use of aspirin    Long term current use of immunosuppressive drug    a.) MTX + secukinumab for RA/psoriatric arthritis diagnoses   Lower extremity edema    Lymphedema    NSTEMI (non-ST elevated myocardial infarction) (HCC) 01/06/2005   a.) LHC 01/07/2005: 60% pLAD, 70% D1, 30% pLCx, 20% pRCA, 25% dRCA -- med mgmt   OSA on CPAP    a.) compliance issues with prescribed nocturnal PAP therapy   Osteoarthritis    Peripheral vascular disease (HCC)    Post-menopausal osteoporosis    Psoriatic arthritis (HCC)    RA (rheumatoid arthritis) (HCC)    Renal insufficiency    T2DM (type 2 diabetes mellitus) (HCC)    Thyroid cancer (HCC)    a.) s/p LEFT hemithyroidectomy 08/2015    Tobacco Use: Social History   Tobacco Use  Smoking Status Former   Current packs/day: 0.00   Average packs/day: 0.5 packs/day for 30.0 years (15.0 ttl pk-yrs)   Types: Cigarettes   Start date: 1   Quit date: 2005   Years since quitting: 19.8  Smokeless Tobacco Never    Labs: Review Flowsheet       Latest Ref Rng & Units 11/11/2022 12/21/2022  Labs for ITP Cardiac and Pulmonary Rehab  Cholestrol 0 - 200 mg/dL - 478   LDL (calc) 0 - 99 mg/dL - 295   HDL-C >62 mg/dL - 50   Trlycerides <130 mg/dL - 60   Hemoglobin Q6V 4.8 - 5.6 % 6.3  -    Details             Exercise Target  Goals: Exercise Program Goal: Individual exercise prescription set using results from initial 6 min walk test and THRR while considering  patient's activity barriers and safety.   Exercise Prescription Goal: Initial exercise prescription builds to 30-45 minutes a day of aerobic activity, 2-3 days per week.  Home exercise guidelines will be given to patient  during program as part of exercise prescription that the participant will acknowledge.   Education: Aerobic Exercise: - Group verbal and visual presentation on the components of exercise prescription. Introduces F.I.T.T principle from ACSM for exercise prescriptions.  Reviews F.I.T.T. principles of aerobic exercise including progression. Written material given at graduation. Flowsheet Row Cardiac Rehab from 01/18/2023 in [facility] Cardiac and Pulmonary Rehab  Education need identified 01/18/23       Education: Resistance Exercise: - Group verbal and visual presentation on the components of exercise prescription. Introduces F.I.T.T principle from ACSM for exercise prescriptions  Reviews F.I.T.T. principles of resistance exercise including progression. Written material given at graduation.    Education: Exercise & Equipment Safety: - Individual verbal instruction and demonstration of equipment use and safety with use of the equipment. Flowsheet Row Cardiac Rehab from 01/18/2023 in [facility] Cardiac and Pulmonary Rehab  Date 01/18/23  Educator MB  Instruction Review Code 1- Verbalizes Understanding       Education: Exercise Physiology & General Exercise Guidelines: - Group verbal and written instruction with models to review the exercise physiology of the cardiovascular system and associated critical values. Provides general exercise guidelines with specific guidelines to those with heart or lung disease.  Flowsheet Row Cardiac Rehab from 01/18/2023 in [facility] Cardiac and Pulmonary Rehab  Education need identified 01/18/23       Education:  Flexibility, Balance, Mind/Body Relaxation: - Group verbal and visual presentation with interactive activity on the components of exercise prescription. Introduces F.I.T.T principle from ACSM for exercise prescriptions. Reviews F.I.T.T. principles of flexibility and balance exercise training including progression. Also discusses the mind body connection.  Reviews various relaxation techniques to help reduce and manage stress (i.e. Deep breathing, progressive muscle relaxation, and visualization). Balance handout provided to take home. Written material given at graduation.   Activity Barriers & Risk Stratification:  Activity Barriers & Cardiac Risk Stratification - 01/18/23 1102       Activity Barriers & Cardiac Risk Stratification   Activity Barriers Joint Problems;Other (comment);Arthritis;Right Knee Replacement    Comments nerve damage knee down R nerve damage to foot to toes. left foot, Right Shoulder tear    Cardiac Risk Stratification High             6 Minute Walk:  6 Minute Walk     Row Name 01/18/23 1059         6 Minute Walk   Phase Initial     Distance 600 feet     Walk Time 5.75 minutes     # of Rest Breaks 2     MPH 1.19     METS 1     RPE 13     Perceived Dyspnea  3     VO2 Peak 3.46     Symptoms No     Resting HR 80 bpm     Resting BP 130/64     Resting Oxygen Saturation  98 %     Exercise Oxygen Saturation  during 6 min walk 98 %     Max Ex. HR 91 bpm     Max Ex. BP 142/70     2 Minute Post BP 126/66              Oxygen Initial Assessment:   Oxygen Re-Evaluation:   Oxygen Discharge (Final Oxygen Re-Evaluation):   Initial Exercise Prescription:  Initial Exercise Prescription - 01/18/23 1100       Date of Initial Exercise RX and Referring Provider   Date 01/18/23  Referring Provider Tiajuana Amass, MD      Oxygen   Maintain Oxygen Saturation 88% or higher      Recumbant Bike   Level 1    RPM 50    Watts 15    Minutes 15     METs 1      NuStep   Level 1    SPM 80    Minutes 15    METs 1      Biostep-RELP   Level 1    SPM 50    Minutes 15    METs 1      Track   Laps 10    Minutes 15    METs 1.54      Prescription Details   Frequency (times per week) 2    Duration Progress to 30 minutes of continuous aerobic without signs/symptoms of physical distress      Intensity   THRR 40-80% of Max Heartrate 108-136    Ratings of Perceived Exertion 11-13    Perceived Dyspnea 0-4      Progression   Progression Continue to progress workloads to maintain intensity without signs/symptoms of physical distress.      Resistance Training   Training Prescription Yes    Weight 2 lb    Reps 10-15             Perform Capillary Blood Glucose checks as needed.  Exercise Prescription Changes:   Exercise Prescription Changes     Row Name 01/18/23 1100             Response to Exercise   Blood Pressure (Admit) 130/64       Blood Pressure (Exercise) 142/70       Blood Pressure (Exit) 126/66       Heart Rate (Admit) 90 bpm       Heart Rate (Exercise) 91 bpm       Heart Rate (Exit) 80 bpm       Oxygen Saturation (Admit) 98 %       Oxygen Saturation (Exercise) 98 %       Oxygen Saturation (Exit) 97 %       Rating of Perceived Exertion (Exercise) 13       Perceived Dyspnea (Exercise) 3       Symptoms none       Comments results       Duration Progress to 30 minutes of  aerobic without signs/symptoms of physical distress       Intensity THRR New         Progression   Progression Continue to progress workloads to maintain intensity without signs/symptoms of physical distress.       Average METs 1                Exercise Comments:   Exercise Comments     Row Name 01/12/23 1157           Exercise Comments Both of her legs have nerve damage and it is hard for her to walk long distances, She cannot use the treadmill. Her doctor wants her to do the program. One being shoulder surgery,  PT after surgery. She developed a new tear in her shoulder and cannot have any surgery.                Exercise Goals and Review:   Exercise Goals     Row Name 01/18/23 1106  Exercise Goals   Increase Physical Activity Yes       Intervention Provide advice, education, support and counseling about physical activity/exercise needs.;Develop an individualized exercise prescription for aerobic and resistive training based on initial evaluation findings, risk stratification, comorbidities and participant's personal goals.       Expected Outcomes Short Term: Attend rehab on a regular basis to increase amount of physical activity.;Long Term: Add in home exercise to make exercise part of routine and to increase amount of physical activity.;Long Term: Exercising regularly at least 3-5 days a week.       Increase Strength and Stamina Yes       Intervention Provide advice, education, support and counseling about physical activity/exercise needs.;Develop an individualized exercise prescription for aerobic and resistive training based on initial evaluation findings, risk stratification, comorbidities and participant's personal goals.       Expected Outcomes Short Term: Increase workloads from initial exercise prescription for resistance, speed, and METs.;Short Term: Perform resistance training exercises routinely during rehab and add in resistance training at home;Long Term: Improve cardiorespiratory fitness, muscular endurance and strength as measured by increased METs and functional capacity ( )       Able to understand and use rate of perceived exertion (RPE) scale Yes       Intervention Provide education and explanation on how to use RPE scale       Expected Outcomes Short Term: Able to use RPE daily in rehab to express subjective intensity level;Long Term:  Able to use RPE to guide intensity level when exercising independently       Able to understand and use Dyspnea scale Yes        Intervention Provide education and explanation on how to use Dyspnea scale       Expected Outcomes Short Term: Able to use Dyspnea scale daily in rehab to express subjective sense of shortness of breath during exertion;Long Term: Able to use Dyspnea scale to guide intensity level when exercising independently       Knowledge and understanding of Target Heart Rate Range (THRR) Yes       Intervention Provide education and explanation of THRR including how the numbers were predicted and where they are located for reference       Expected Outcomes Short Term: Able to state/look up THRR;Short Term: Able to use daily as guideline for intensity in rehab;Long Term: Able to use THRR to govern intensity when exercising independently       Able to check pulse independently Yes       Intervention Provide education and demonstration on how to check pulse in carotid and radial arteries.;Review the importance of being able to check your own pulse for safety during independent exercise       Expected Outcomes Short Term: Able to explain why pulse checking is important during independent exercise;Long Term: Able to check pulse independently and accurately       Understanding of Exercise Prescription Yes       Intervention Provide education, explanation, and written materials on patient's individual exercise prescription       Expected Outcomes Short Term: Able to explain program exercise prescription;Long Term: Able to explain home exercise prescription to exercise independently                Exercise Goals Re-Evaluation :   Discharge Exercise Prescription (Final Exercise Prescription Changes):  Exercise Prescription Changes - 01/18/23 1100       Response to Exercise   Blood Pressure (Admit)  130/64    Blood Pressure (Exercise) 142/70    Blood Pressure (Exit) 126/66    Heart Rate (Admit) 90 bpm    Heart Rate (Exercise) 91 bpm    Heart Rate (Exit) 80 bpm    Oxygen Saturation (Admit) 98 %    Oxygen  Saturation (Exercise) 98 %    Oxygen Saturation (Exit) 97 %    Rating of Perceived Exertion (Exercise) 13    Perceived Dyspnea (Exercise) 3    Symptoms none    Comments results    Duration Progress to 30 minutes of  aerobic without signs/symptoms of physical distress    Intensity THRR New      Progression   Progression Continue to progress workloads to maintain intensity without signs/symptoms of physical distress.    Average METs 1             Nutrition:  Target Goals: Understanding of nutrition guidelines, daily intake of sodium 1500mg , cholesterol 200mg , calories 30% from fat and 7% or less from saturated fats, daily to have 5 or more servings of fruits and vegetables.  Education: All About Nutrition: -Group instruction provided by verbal, written material, interactive activities, discussions, models, and posters to present general guidelines for heart healthy nutrition including fat, fiber, MyPlate, the role of sodium in heart healthy nutrition, utilization of the nutrition label, and utilization of this knowledge for meal planning. Follow up email sent as well. Written material given at graduation. Flowsheet Row Cardiac Rehab from 01/18/2023 in [facility] Cardiac and Pulmonary Rehab  Education need identified 01/18/23       Biometrics:  Pre Biometrics - 01/18/23 1106       Pre Biometrics   Height 5' 5.5" (1.664 m)    Weight 245 lb 3.2 oz (111.2 kg)    Waist Circumference 44.5 inches    Hip Circumference 55 inches    Waist to Hip Ratio 0.81 %    BMI (Calculated) 40.17    Single Leg Stand 0.6 seconds              Nutrition Therapy Plan and Nutrition Goals:  Nutrition Therapy & Goals - 01/18/23 1108       Nutrition Therapy   RD appointment deferred Yes      Personal Nutrition Goals   Nutrition Goal Patient deferred RD appointment      Intervention Plan   Intervention Prescribe, educate and counsel regarding individualized specific dietary modifications  aiming towards targeted core components such as weight, hypertension, lipid management, diabetes, heart failure and other comorbidities.;Nutrition handout(s) given to patient.    Expected Outcomes Short Term Goal: Understand basic principles of dietary content, such as calories, fat, sodium, cholesterol and nutrients.;Short Term Goal: A plan has been developed with personal nutrition goals set during dietitian appointment.;Long Term Goal: Adherence to prescribed nutrition plan.             Nutrition Assessments:  MEDIFICTS Score Key: >=70 Need to make dietary changes  40-70 Heart Healthy Diet <= 40 Therapeutic Level Cholesterol Diet  Flowsheet Row Cardiac Rehab from 01/18/2023 in [facility] Cardiac and Pulmonary Rehab  Picture Your Plate Total Score on Admission 64      Picture Your Plate Scores: <29 Unhealthy dietary pattern with much room for improvement. 41-50 Dietary pattern unlikely to meet recommendations for good health and room for improvement. 51-60 More healthful dietary pattern, with some room for improvement.  >60 Healthy dietary pattern, although there may be some specific behaviors that could be improved.  Nutrition Goals Re-Evaluation:   Nutrition Goals Discharge (Final Nutrition Goals Re-Evaluation):   Psychosocial: Target Goals: Acknowledge presence or absence of significant depression and/or stress, maximize coping skills, provide positive support system. Participant is able to verbalize types and ability to use techniques and skills needed for reducing stress and depression.   Education: Stress, Anxiety, and Depression - Group verbal and visual presentation to define topics covered.  Reviews how body is impacted by stress, anxiety, and depression.  Also discusses healthy ways to reduce stress and to treat/manage anxiety and depression.  Written material given at graduation. Flowsheet Row Cardiac Rehab from 01/18/2023 in [facility] Cardiac and Pulmonary Rehab  Education  need identified 01/18/23       Education: Sleep Hygiene -Provides group verbal and written instruction about how sleep can affect your health.  Define sleep hygiene, discuss sleep cycles and impact of sleep habits. Review good sleep hygiene tips.    Initial Review & Psychosocial Screening:  Initial Psych Review & Screening - 01/12/23 1130       Initial Review   Current issues with None Identified      Family Dynamics   Good Support System? Yes   husband, grandson,  sister     Barriers   Psychosocial barriers to participate in program There are no identifiable barriers or psychosocial needs.      Screening Interventions   Interventions Encouraged to exercise;To provide support and resources with identified psychosocial needs;Provide feedback about the scores to participant    Expected Outcomes Short Term goal: Utilizing psychosocial counselor, staff and physician to assist with identification of specific Stressors or current issues interfering with healing process. Setting desired goal for each stressor or current issue identified.;Long Term Goal: Stressors or current issues are controlled or eliminated.;Short Term goal: Identification and review with participant of any Quality of Life or Depression concerns found by scoring the questionnaire.;Long Term goal: The participant improves quality of Life and PHQ9 Scores as seen by post scores and/or verbalization of changes             Quality of Life Scores:   Quality of Life - 01/18/23 1107       Quality of Life   Select Quality of Life      Quality of Life Scores   Health/Function Pre 22.5 %    Socioeconomic Pre 26.88 %    Psych/Spiritual Pre 28.14 %    Family Pre 27.1 %    GLOBAL Pre 25.29 %            Scores of 19 and below usually indicate a poorer quality of life in these areas.  A difference of  2-3 points is a clinically meaningful difference.  A difference of 2-3 points in the total score of the Quality of Life  Index has been associated with significant improvement in overall quality of life, self-image, physical symptoms, and general health in studies assessing change in quality of life.  PHQ-9: Review Flowsheet       01/18/2023 08/18/2017  Depression screen PHQ 2/9  Decreased Interest 0 0  Down, Depressed, Hopeless 0 0  PHQ - 2 Score 0 0  Altered sleeping 3 -  Tired, decreased energy 3 -  Change in appetite 1 -  Feeling bad or failure about yourself  0 -  Trouble concentrating 0 -  Moving slowly or fidgety/restless 0 -  Suicidal thoughts 0 -  PHQ-9 Score 7 -  Difficult doing work/chores Somewhat difficult -  Details           Interpretation of Total Score  Total Score Depression Severity:  1-4 = Minimal depression, 5-9 = Mild depression, 10-14 = Moderate depression, 15-19 = Moderately severe depression, 20-27 = Severe depression   Psychosocial Evaluation and Intervention:  Psychosocial Evaluation - 01/12/23 1149       Psychosocial Evaluation & Interventions   Interventions Encouraged to exercise with the program and follow exercise prescription    Comments There are no barriers to attending the program.  She does have exercise barriers.  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 Her doctor wants her to do the program.  She lives with her husband.  He helps her as needed and she has a daughter,grandson and her sister. for support. She has been dealing with multiple hospitalizations this year.   One being shoulder surgery, PT after surgery.  She developed a new tear in her shoulder and cannot have any surgery. She is ready to attend the program    Expected Outcomes STG attends all scheduled sessions, is able to see exercise progression even with her limited use of legs and 1 arm. LTG  able to continue her exercise progression.    Continue Psychosocial Services  Follow up required by staff              Psychosocial Re-Evaluation:   Psychosocial Discharge (Final Psychosocial Re-Evaluation):   Vocational Rehabilitation: Provide vocational rehab assistance to qualifying candidates.   Vocational Rehab Evaluation & Intervention:  Vocational Rehab - 01/12/23 1138       Initial Vocational Rehab Evaluation & Intervention   Assessment shows need for Vocational Rehabilitation No      Vocational Rehab Re-Evaulation   Comments retired      Discharge Vocational Rehab   Discharge Vocational Rehabilitation retired             Education: Education Goals: Education classes will be provided on a variety of topics geared toward better understanding of heart health and risk factor modification. Participant will state understanding/return demonstration of topics presented as noted by education test scores.  Learning Barriers/Preferences:   General Cardiac Education Topics:  AED/CPR: - Group verbal and written instruction with the use of models to demonstrate the basic use of the AED with the basic ABC's of resuscitation.   Anatomy and Cardiac Procedures: - Group verbal and visual presentation and models provide information about basic cardiac anatomy and function. Reviews the testing methods done to diagnose heart disease and the outcomes of the test results. Describes the treatment choices: Medical Management, Angioplasty, or Coronary Bypass Surgery for treating various heart conditions including Myocardial Infarction, Angina, Valve Disease, and Cardiac Arrhythmias.  Written material given at graduation. Flowsheet Row Cardiac Rehab from 01/18/2023 in [facility] Cardiac and Pulmonary Rehab  Education need identified 01/18/23       Medication Safety: - Group verbal and visual instruction to review commonly prescribed medications for heart and lung disease. Reviews the medication, class of the drug, and side effects. Includes the steps to properly store meds and maintain the prescription  regimen.  Written material given at graduation.   Intimacy: - Group verbal instruction through game format to discuss how heart and lung disease can affect sexual intimacy. Written material given at graduation.. Flowsheet Row Cardiac Rehab from 01/18/2023 in [facility] Cardiac and Pulmonary Rehab  Education need identified 01/18/23  Know Your Numbers and Heart Failure: - Group verbal and visual instruction to discuss disease risk factors for cardiac and pulmonary disease and treatment options.  Reviews associated critical values for Overweight/Obesity, Hypertension, Cholesterol, and Diabetes.  Discusses basics of heart failure: signs/symptoms and treatments.  Introduces Heart Failure Zone chart for action plan for heart failure.  Written material given at graduation.   Infection Prevention: - Provides verbal and written material to individual with discussion of infection control including proper hand washing and proper equipment cleaning during exercise session. Flowsheet Row Cardiac Rehab from 01/18/2023 in [facility] Cardiac and Pulmonary Rehab  Date 01/18/23  Educator MB  Instruction Review Code 1- Verbalizes Understanding       Falls Prevention: - Provides verbal and written material to individual with discussion of falls prevention and safety. Flowsheet Row Cardiac Rehab from 01/18/2023 in [facility] Cardiac and Pulmonary Rehab  Date 01/18/23  Educator MB  Instruction Review Code 1- Verbalizes Understanding       Other: -Provides group and verbal instruction on various topics (see comments)   Knowledge Questionnaire Score:  Knowledge Questionnaire Score - 01/18/23 1110       Knowledge Questionnaire Score   Pre Score 18/26             Core Components/Risk Factors/Patient Goals at Admission:  Personal Goals and Risk Factors at Admission - 01/18/23 1110       Core Components/Risk Factors/Patient Goals on Admission    Weight Management Yes;Weight Loss    Intervention Weight  Management: Develop a combined nutrition and exercise program designed to reach desired caloric intake, while maintaining appropriate intake of nutrient and fiber, sodium and fats, and appropriate energy expenditure required for the weight goal.;Weight Management: Provide education and appropriate resources to help participant work on and attain dietary goals.;Weight Management/Obesity: Establish reasonable short term and long term weight goals.    Admit Weight 245 lb 3.2 oz (111.2 kg)    Goal Weight: Short Term 240 lb (108.9 kg)    Goal Weight: Long Term 180 lb (81.6 kg)    Expected Outcomes Short Term: Continue to assess and modify interventions until short term weight is achieved;Long Term: Adherence to nutrition and physical activity/exercise program aimed toward attainment of established weight goal;Weight Loss: Understanding of general recommendations for a balanced deficit meal plan, which promotes 1-2 lb weight loss per week and includes a negative energy balance of [phone] kcal/d;Understanding recommendations for meals to include 15-35% energy as protein, 25-35% energy from fat, 35-60% energy from carbohydrates, less than 200mg  of dietary cholesterol, 20-35 gm of total fiber daily;Understanding of distribution of calorie intake throughout the day with the consumption of 4-5 meals/snacks    Hypertension Yes    Intervention Provide education on lifestyle modifcations including regular physical activity/exercise, weight management, moderate sodium restriction and increased consumption of fresh fruit, vegetables, and low fat dairy, alcohol moderation, and smoking cessation.;Monitor prescription use compliance.    Expected Outcomes Short Term: Continued assessment and intervention until BP is < 140/75mm HG in hypertensive participants. < 130/40mm HG in hypertensive participants with diabetes, heart failure or chronic kidney disease.;Long Term: Maintenance of blood pressure at goal levels.    Lipids Yes     Intervention Provide education and support for participant on nutrition & aerobic/resistive exercise along with prescribed medications to achieve LDL 70mg , HDL >40mg .    Expected Outcomes Short Term: Participant states understanding of desired cholesterol values and is compliant with medications prescribed. Participant is following exercise prescription and nutrition  guidelines.;Long Term: Cholesterol controlled with medications as prescribed, with individualized exercise RX and with personalized nutrition plan. Value goals: LDL < 70mg , HDL > 40 mg.             Education:Diabetes - Individual verbal and written instruction to review signs/symptoms of diabetes, desired ranges of glucose level fasting, after meals and with exercise. Acknowledge that pre and post exercise glucose checks will be done for 3 sessions at entry of program.   Core Components/Risk Factors/Patient Goals Review:    Core Components/Risk Factors/Patient Goals at Discharge (Final Review):    ITP Comments:  ITP Comments     Row Name 01/12/23 1156 01/18/23 1059         ITP Comments Virtual orientation call completed today. shehas an appointment on Date: 01/18/2023  for EP eval and gym Orientation.  Documentation of diagnosis can be found in [facility] Date: 12/20/2022 . Completed and gym orientation. Initial ITP created and sent for review to Dr. Bethann Punches, Medical Director.               Comments: Initial ITP

## 2023-01-26 ENCOUNTER — Encounter: Payer: PPO | Admitting: *Deleted

## 2023-01-26 DIAGNOSIS — I213 ST elevation (STEMI) myocardial infarction of unspecified site: Secondary | ICD-10-CM | POA: Diagnosis not present

## 2023-01-26 DIAGNOSIS — Z955 Presence of coronary angioplasty implant and graft: Secondary | ICD-10-CM

## 2023-01-26 NOTE — Progress Notes (Signed)
Daily Session Note  Patient Details  Name: Jacqueline Rhodes MRN: [id number] Date of Birth: Dec 29, 1955 Referring Provider:   Flowsheet Row Cardiac Rehab from 01/18/2023 in [facility] Cardiac and Pulmonary Rehab  Referring Provider Tiajuana Amass, MD       Encounter Date: 01/26/2023  Check In:  Session Check In - 01/26/23 0758       Check-In   Supervising physician immediately available to respond to emergencies See telemetry face sheet for immediately available ER MD    Location [facility]-Cardiac & Pulmonary Rehab    Staff Present Ronette Deter, BS, Exercise Physiologist;Susanne Bice, RN, BSN, CCRP;Joseph [place], Guinevere Ferrari, RN, [place]    Virtual Visit No    Medication changes reported     No    Fall or balance concerns reported    No    Warm-up and Cool-down Performed on first and last piece of equipment    Resistance Training Performed Yes    VAD Patient? No    PAD/SET Patient? No      Pain Assessment   Currently in Pain? No/denies                Social History   Tobacco Use  Smoking Status Former   Current packs/day: 0.00   Average packs/day: 0.5 packs/day for 30.0 years (15.0 ttl pk-yrs)   Types: Cigarettes   Start date: 8   Quit date: 2005   Years since quitting: 19.8  Smokeless Tobacco Never    Goals Met:  Independence with exercise equipment Exercise tolerated well No report of concerns or symptoms today Strength training completed today  Goals Unmet:  Not Applicable  Comments: First full day of exercise!  Patient was oriented to gym and equipment including functions, settings, policies, and procedures.  Patient's individual exercise prescription and treatment plan were reviewed.  All starting workloads were established based on the results of the 6 minute walk test done at initial orientation visit.  The plan for exercise progression was also introduced and progression will be customized based on patient's performance and goals.    Dr. Bethann Punches is Medical Director for [facility] Cardiac Rehabilitation.  Dr. Vida Rigger is Medical Director for [facility] Pulmonary Rehabilitation.

## 2023-01-31 ENCOUNTER — Encounter: Payer: PPO | Admitting: *Deleted

## 2023-01-31 DIAGNOSIS — Z955 Presence of coronary angioplasty implant and graft: Secondary | ICD-10-CM

## 2023-01-31 DIAGNOSIS — I213 ST elevation (STEMI) myocardial infarction of unspecified site: Secondary | ICD-10-CM | POA: Diagnosis not present

## 2023-01-31 NOTE — Progress Notes (Signed)
Daily Session Note  Patient Details  Name: Jacqueline Rhodes MRN: [id number] Date of Birth: [birth year] Referring Provider:   Flowsheet Row Cardiac Rehab from 01/18/2023 in [facility] Cardiac and Pulmonary Rehab  Referring Provider Tiajuana Amass, MD       Encounter Date: 01/31/2023  Check In:  Session Check In - 01/31/23 0823       Check-In   Supervising physician immediately available to respond to emergencies See telemetry face sheet for immediately available ER MD    Location [facility]-Cardiac & Pulmonary Rehab    Staff Present Rory Percy, MS, Exercise Physiologist;Curran Lenderman, RN, BSN, CCRP;Noah Tickle, BS, Exercise Physiologist;Maxon Conetta BS, , Exercise Physiologist    Virtual Visit No    Medication changes reported     No    Fall or balance concerns reported    No    Warm-up and Cool-down Performed on first and last piece of equipment    Resistance Training Performed Yes    VAD Patient? No    PAD/SET Patient? No      Pain Assessment   Currently in Pain? No/denies                Social History   Tobacco Use  Smoking Status Former   Current packs/day: 0.00   Average packs/day: 0.5 packs/day for 30.0 years (15.0 ttl pk-yrs)   Types: Cigarettes   Start date: 78   Quit date: 2005   Years since quitting: 19.8  Smokeless Tobacco Never    Goals Met:  Independence with exercise equipment Exercise tolerated well No report of concerns or symptoms today  Goals Unmet:  Not Applicable  Comments: Pt able to follow exercise prescription today without complaint.  Will continue to monitor for progression.     Dr. Bethann Punches is Medical Director for [facility] Cardiac Rehabilitation.  Dr. Vida Rigger is Medical Director for [facility] Pulmonary Rehabilitation.

## 2023-02-01 ENCOUNTER — Encounter: Payer: Self-pay | Admitting: *Deleted

## 2023-02-01 DIAGNOSIS — Z955 Presence of coronary angioplasty implant and graft: Secondary | ICD-10-CM

## 2023-02-01 DIAGNOSIS — I213 ST elevation (STEMI) myocardial infarction of unspecified site: Secondary | ICD-10-CM

## 2023-02-01 NOTE — Progress Notes (Signed)
Cardiac Individual Treatment Plan  Patient Details  Name: Jacqueline Rhodes MRN: [id number] Date of Birth: 1955/12/02 Referring Provider:   Flowsheet Row Cardiac Rehab from 01/18/2023 in [facility] Cardiac and Pulmonary Rehab  Referring Provider Tiajuana Amass, MD       Initial Encounter Date:  Flowsheet Row Cardiac Rehab from 01/18/2023 in [facility] Cardiac and Pulmonary Rehab  Date 01/18/23       Visit Diagnosis: ST elevation myocardial infarction (STEMI), unspecified artery [facility])  Status post coronary artery stent placement  Patient's Home Medications on Admission:  Current Outpatient Medications:    acetaminophen (TYLENOL) 500 MG tablet, Take 2 tablets (1,000 mg total) by mouth every 8 (eight) hours. (Patient taking differently: Take 1,000 mg by mouth every 8 (eight) hours as needed for mild pain (pain score 1-3), moderate pain (pain score 4-6), fever or headache.), Disp: 90 tablet, Rfl: 2   albuterol (PROVENTIL HFA;VENTOLIN HFA) 108 (90 Base) MCG/ACT inhaler, Inhale 2 puffs into the lungs every 6 (six) hours as needed for wheezing or shortness of breath. , Disp: , Rfl:    aspirin 81 MG chewable tablet, Chew 1 tablet (81 mg total) by mouth daily., Disp: 30 tablet, Rfl: 0   Calcium Fructoborate POWD, 20 mcg by Does not apply route. Cream, Disp: , Rfl:    Cetirizine HCl 10 MG CAPS, Take 10 mg by mouth daily. , Disp: , Rfl:    Cholecalciferol 25 MCG (1000 UT) TBDP, Take 125 mcg by mouth., Disp: , Rfl:    Coenzyme Q10 (COQ-10) 100 MG CAPS, Take 100 mg by mouth daily. , Disp: , Rfl:    ezetimibe (ZETIA) 10 MG tablet, Take 1 tablet (10 mg total) by mouth daily., Disp: 30 tablet, Rfl: 0   fluticasone furoate-vilanterol (BREO ELLIPTA) 100-25 MCG/INH AEPB, Inhale 1 puff into the lungs daily. , Disp: , Rfl:    folic acid (FOLVITE) 1 MG tablet, Take 1 mg by mouth daily., Disp: , Rfl:    furosemide (LASIX) 20 MG tablet, Take 20 mg by mouth daily as needed for fluid or edema., Disp: , Rfl:     isosorbide mononitrate (IMDUR) 60 MG 24 hr tablet, Take 2 tablets (120 mg total) by mouth daily., Disp: 60 tablet, Rfl: 0   levothyroxine (SYNTHROID) 150 MCG tablet, Take 150 mcg by mouth daily before breakfast., Disp: , Rfl:    meclizine (ANTIVERT) 25 MG tablet, Take 25 mg by mouth 3 (three) times daily as needed for dizziness., Disp: , Rfl:    methotrexate (RHEUMATREX) 2.5 MG tablet, Take 20 mg by mouth every Thursday. , Disp: , Rfl:    metoprolol succinate (TOPROL-XL) 25 MG 24 hr tablet, Take 1 tablet (25 mg total) by mouth 2 (two) times daily., Disp: 60 tablet, Rfl: 0   nitroGLYCERIN (NITROSTAT) 0.4 MG SL tablet, Place 1 tablet (0.4 mg total) under the tongue once every 5 (five) minutes as needed for chest pain. (Max of 3 doses in 15 minutes)., Disp: 25 tablet, Rfl: 0   pantoprazole (PROTONIX) 40 MG tablet, Take 40 mg by mouth 2 (two) times daily., Disp: , Rfl:    pravastatin (PRAVACHOL) 20 MG tablet, Take 1 tablet (20 mg total) by mouth daily., Disp: 30 tablet, Rfl: 0   Secukinumab (COSENTYX SENSOREADY 300 DOSE) 150 MG/ML SOAJ, Inject 300 mg into the skin every 28 (twenty-eight) days.  (Patient not taking: Reported on 01/12/2023), Disp: , Rfl:    spironolactone (ALDACTONE) 25 MG tablet, Take 0.5 tablet (12.5 mg total) by mouth  daily., Disp: 15 tablet, Rfl: 0   ticagrelor (BRILINTA) 90 MG TABS tablet, Take 1 tablet (90 mg total) by mouth 2 (two) times daily., Disp: 60 tablet, Rfl: 0   UNABLE TO FIND, Take 1.5 drops by mouth daily at 2 PM. Med Name: B12  5,000 unit, B6 3400 unit B1  and Niacin, Disp: , Rfl:   Past Medical History: Past Medical History:  Diagnosis Date   Anemia    Anginal pain (HCC)    Aortic atherosclerosis (HCC)    Asthma    B12 deficiency    Carpal tunnel syndrome    CHF (congestive heart failure) (HCC)    Chronic venous insufficiency    Complete tear of right rotator cuff    COPD (chronic obstructive pulmonary disease) (HCC)    Coronary artery disease 01/07/2005    a.) LHC 01/07/2005 (NSTEMI): LHC 60% pLAD, 70% D1, 30% pLCx, 20% pRCA, 25% dRCA - med mgmt; b.) LHC ([place]) 01/10/2008: 25/60% pLAD, 70% D1, 20% pLCx, 20% mRCA --> FFR normal --> med mgmt; c.) LHC ([place]) 01/04/2012:: 40/60% pLAD, 70% D1, 25% pLCx, 20% pRCA, 20% mRCA, 25% dRCA --> FFR normal --> med mgmt; d.) LHC ([place]) 01/10/2018: 20% pRCA, 50% pLAD, 55% oD1 -- med mgmt   Diverticulosis    DOE (dyspnea on exertion)    GERD (gastroesophageal reflux disease)    History of bilateral cataract extraction 2014   Hyperlipidemia    Hypertension    Hypothyroidism    a.) s/p LEFT hemithyroidectomy; on levothyroxine   Long term current use of aspirin    Long term current use of immunosuppressive drug    a.) MTX + secukinumab for RA/psoriatric arthritis diagnoses   Lower extremity edema    Lymphedema    NSTEMI (non-ST elevated myocardial infarction) (HCC) 01/06/2005   a.) LHC 01/07/2005: 60% pLAD, 70% D1, 30% pLCx, 20% pRCA, 25% dRCA -- med mgmt   OSA on CPAP    a.) compliance issues with prescribed nocturnal PAP therapy   Osteoarthritis    Peripheral vascular disease (HCC)    Post-menopausal osteoporosis    Psoriatic arthritis (HCC)    RA (rheumatoid arthritis) (HCC)    Renal insufficiency    T2DM (type 2 diabetes mellitus) (HCC)    Thyroid cancer (HCC)    a.) s/p LEFT hemithyroidectomy 08/2015    Tobacco Use: Social History   Tobacco Use  Smoking Status Former   Current packs/day: 0.00   Average packs/day: 0.5 packs/day for 30.0 years (15.0 ttl pk-yrs)   Types: Cigarettes   Start date: 8   Quit date: 2005   Years since quitting: 19.8  Smokeless Tobacco Never    Labs: Review Flowsheet       Latest Ref Rng & Units 11/11/2022 12/21/2022  Labs for ITP Cardiac and Pulmonary Rehab  Cholestrol 0 - 200 mg/dL - 914   LDL (calc) 0 - 99 mg/dL - 782   HDL-C >95 mg/dL - 50   Trlycerides <621 mg/dL - 60   Hemoglobin H0Q 4.8 - 5.6 % 6.3  -    Details             Exercise Target  Goals: Exercise Program Goal: Individual exercise prescription set using results from initial 6 min walk test and THRR while considering  patient's activity barriers and safety.   Exercise Prescription Goal: Initial exercise prescription builds to 30-45 minutes a day of aerobic activity, 2-3 days per week.  Home exercise guidelines will be given to patient  during program as part of exercise prescription that the participant will acknowledge.   Education: Aerobic Exercise: - Group verbal and visual presentation on the components of exercise prescription. Introduces F.I.T.T principle from ACSM for exercise prescriptions.  Reviews F.I.T.T. principles of aerobic exercise including progression. Written material given at graduation. Flowsheet Row Cardiac Rehab from 01/26/2023 in [facility] Cardiac and Pulmonary Rehab  Education need identified 01/18/23       Education: Resistance Exercise: - Group verbal and visual presentation on the components of exercise prescription. Introduces F.I.T.T principle from ACSM for exercise prescriptions  Reviews F.I.T.T. principles of resistance exercise including progression. Written material given at graduation.    Education: Exercise & Equipment Safety: - Individual verbal instruction and demonstration of equipment use and safety with use of the equipment. Flowsheet Row Cardiac Rehab from 01/26/2023 in [facility] Cardiac and Pulmonary Rehab  Date 01/18/23  Educator MB  Instruction Review Code 1- Verbalizes Understanding       Education: Exercise Physiology & General Exercise Guidelines: - Group verbal and written instruction with models to review the exercise physiology of the cardiovascular system and associated critical values. Provides general exercise guidelines with specific guidelines to those with heart or lung disease.  Flowsheet Row Cardiac Rehab from 01/26/2023 in [facility] Cardiac and Pulmonary Rehab  Education need identified 01/18/23       Education:  Flexibility, Balance, Mind/Body Relaxation: - Group verbal and visual presentation with interactive activity on the components of exercise prescription. Introduces F.I.T.T principle from ACSM for exercise prescriptions. Reviews F.I.T.T. principles of flexibility and balance exercise training including progression. Also discusses the mind body connection.  Reviews various relaxation techniques to help reduce and manage stress (i.e. Deep breathing, progressive muscle relaxation, and visualization). Balance handout provided to take home. Written material given at graduation.   Activity Barriers & Risk Stratification:  Activity Barriers & Cardiac Risk Stratification - 01/18/23 1102       Activity Barriers & Cardiac Risk Stratification   Activity Barriers Joint Problems;Other (comment);Arthritis;Right Knee Replacement    Comments nerve damage knee down R nerve damage to foot to toes. left foot, Right Shoulder tear    Cardiac Risk Stratification High             6 Minute Walk:  6 Minute Walk     Row Name 01/18/23 1059         6 Minute Walk   Phase Initial     Distance 600 feet     Walk Time 5.75 minutes     # of Rest Breaks 2     MPH 1.19     METS 1     RPE 13     Perceived Dyspnea  3     VO2 Peak 3.46     Symptoms No     Resting HR 80 bpm     Resting BP 130/64     Resting Oxygen Saturation  98 %     Exercise Oxygen Saturation  during 6 min walk 98 %     Max Ex. HR 91 bpm     Max Ex. BP 142/70     2 Minute Post BP 126/66              Oxygen Initial Assessment:   Oxygen Re-Evaluation:   Oxygen Discharge (Final Oxygen Re-Evaluation):   Initial Exercise Prescription:  Initial Exercise Prescription - 01/18/23 1100       Date of Initial Exercise RX and Referring Provider   Date 01/18/23  Referring Provider Tiajuana Amass, MD      Oxygen   Maintain Oxygen Saturation 88% or higher      Recumbant Bike   Level 1    RPM 50    Watts 15    Minutes 15     METs 1      NuStep   Level 1    SPM 80    Minutes 15    METs 1      Biostep-RELP   Level 1    SPM 50    Minutes 15    METs 1      Track   Laps 10    Minutes 15    METs 1.54      Prescription Details   Frequency (times per week) 2    Duration Progress to 30 minutes of continuous aerobic without signs/symptoms of physical distress      Intensity   THRR 40-80% of Max Heartrate 108-136    Ratings of Perceived Exertion 11-13    Perceived Dyspnea 0-4      Progression   Progression Continue to progress workloads to maintain intensity without signs/symptoms of physical distress.      Resistance Training   Training Prescription Yes    Weight 2 lb    Reps 10-15             Perform Capillary Blood Glucose checks as needed.  Exercise Prescription Changes:   Exercise Prescription Changes     Row Name 01/18/23 1100             Response to Exercise   Blood Pressure (Admit) 130/64       Blood Pressure (Exercise) 142/70       Blood Pressure (Exit) 126/66       Heart Rate (Admit) 90 bpm       Heart Rate (Exercise) 91 bpm       Heart Rate (Exit) 80 bpm       Oxygen Saturation (Admit) 98 %       Oxygen Saturation (Exercise) 98 %       Oxygen Saturation (Exit) 97 %       Rating of Perceived Exertion (Exercise) 13       Perceived Dyspnea (Exercise) 3       Symptoms none       Comments results       Duration Progress to 30 minutes of  aerobic without signs/symptoms of physical distress       Intensity THRR New         Progression   Progression Continue to progress workloads to maintain intensity without signs/symptoms of physical distress.       Average METs 1                Exercise Comments:   Exercise Comments     Row Name 01/12/23 1157 01/26/23 0820         Exercise Comments Both of her legs have nerve damage and it is hard for her to walk long distances, She cannot use the treadmill. Her doctor wants her to do the program. One being shoulder  surgery, PT after surgery. She developed a new tear in her shoulder and cannot have any surgery. First full day of exercise!  Patient was oriented to gym and equipment including functions, settings, policies, and procedures.  Patient's individual exercise prescription and treatment plan were reviewed.  All starting workloads were established based on the results of the 6  minute walk test done at initial orientation visit.  The plan for exercise progression was also introduced and progression will be customized based on patient's performance and goals.               Exercise Goals and Review:   Exercise Goals     Row Name 01/18/23 1106             Exercise Goals   Increase Physical Activity Yes       Intervention Provide advice, education, support and counseling about physical activity/exercise needs.;Develop an individualized exercise prescription for aerobic and resistive training based on initial evaluation findings, risk stratification, comorbidities and participant's personal goals.       Expected Outcomes Short Term: Attend rehab on a regular basis to increase amount of physical activity.;Long Term: Add in home exercise to make exercise part of routine and to increase amount of physical activity.;Long Term: Exercising regularly at least 3-5 days a week.       Increase Strength and Stamina Yes       Intervention Provide advice, education, support and counseling about physical activity/exercise needs.;Develop an individualized exercise prescription for aerobic and resistive training based on initial evaluation findings, risk stratification, comorbidities and participant's personal goals.       Expected Outcomes Short Term: Increase workloads from initial exercise prescription for resistance, speed, and METs.;Short Term: Perform resistance training exercises routinely during rehab and add in resistance training at home;Long Term: Improve cardiorespiratory fitness, muscular endurance and  strength as measured by increased METs and functional capacity ( )       Able to understand and use rate of perceived exertion (RPE) scale Yes       Intervention Provide education and explanation on how to use RPE scale       Expected Outcomes Short Term: Able to use RPE daily in rehab to express subjective intensity level;Long Term:  Able to use RPE to guide intensity level when exercising independently       Able to understand and use Dyspnea scale Yes       Intervention Provide education and explanation on how to use Dyspnea scale       Expected Outcomes Short Term: Able to use Dyspnea scale daily in rehab to express subjective sense of shortness of breath during exertion;Long Term: Able to use Dyspnea scale to guide intensity level when exercising independently       Knowledge and understanding of Target Heart Rate Range (THRR) Yes       Intervention Provide education and explanation of THRR including how the numbers were predicted and where they are located for reference       Expected Outcomes Short Term: Able to state/look up THRR;Short Term: Able to use daily as guideline for intensity in rehab;Long Term: Able to use THRR to govern intensity when exercising independently       Able to check pulse independently Yes       Intervention Provide education and demonstration on how to check pulse in carotid and radial arteries.;Review the importance of being able to check your own pulse for safety during independent exercise       Expected Outcomes Short Term: Able to explain why pulse checking is important during independent exercise;Long Term: Able to check pulse independently and accurately       Understanding of Exercise Prescription Yes       Intervention Provide education, explanation, and written materials on patient's individual exercise prescription  Expected Outcomes Short Term: Able to explain program exercise prescription;Long Term: Able to explain home exercise prescription to  exercise independently                Exercise Goals Re-Evaluation :  Exercise Goals Re-Evaluation     Row Name 01/26/23 0820             Exercise Goal Re-Evaluation   Exercise Goals Review Able to understand and use rate of perceived exertion (RPE) scale;Able to understand and use Dyspnea scale;Knowledge and understanding of Target Heart Rate Range (THRR);Understanding of Exercise Prescription       Comments Reviewed RPE and dyspnea scale, THR and program prescription with pt today.  Pt voiced understanding and was given a copy of goals to take home.       Expected Outcomes Short: Use RPE daily to regulate intensity. Long: Follow program prescription in THR.                Discharge Exercise Prescription (Final Exercise Prescription Changes):  Exercise Prescription Changes - 01/18/23 1100       Response to Exercise   Blood Pressure (Admit) 130/64    Blood Pressure (Exercise) 142/70    Blood Pressure (Exit) 126/66    Heart Rate (Admit) 90 bpm    Heart Rate (Exercise) 91 bpm    Heart Rate (Exit) 80 bpm    Oxygen Saturation (Admit) 98 %    Oxygen Saturation (Exercise) 98 %    Oxygen Saturation (Exit) 97 %    Rating of Perceived Exertion (Exercise) 13    Perceived Dyspnea (Exercise) 3    Symptoms none    Comments results    Duration Progress to 30 minutes of  aerobic without signs/symptoms of physical distress    Intensity THRR New      Progression   Progression Continue to progress workloads to maintain intensity without signs/symptoms of physical distress.    Average METs 1             Nutrition:  Target Goals: Understanding of nutrition guidelines, daily intake of sodium 1500mg , cholesterol 200mg , calories 30% from fat and 7% or less from saturated fats, daily to have 5 or more servings of fruits and vegetables.  Education: All About Nutrition: -Group instruction provided by verbal, written material, interactive activities, discussions, models,  and posters to present general guidelines for heart healthy nutrition including fat, fiber, MyPlate, the role of sodium in heart healthy nutrition, utilization of the nutrition label, and utilization of this knowledge for meal planning. Follow up email sent as well. Written material given at graduation. Flowsheet Row Cardiac Rehab from 01/26/2023 in [facility] Cardiac and Pulmonary Rehab  Education need identified 01/18/23  Date 01/26/23  Educator JG  Instruction Review Code 1- Verbalizes Understanding       Biometrics:  Pre Biometrics - 01/18/23 1106       Pre Biometrics   Height 5' 5.5" (1.664 m)    Weight 245 lb 3.2 oz (111.2 kg)    Waist Circumference 44.5 inches    Hip Circumference 55 inches    Waist to Hip Ratio 0.81 %    BMI (Calculated) 40.17    Single Leg Stand 0.6 seconds              Nutrition Therapy Plan and Nutrition Goals:  Nutrition Therapy & Goals - 01/18/23 1108       Nutrition Therapy   RD appointment deferred Yes      Personal  Nutrition Goals   Nutrition Goal Patient deferred RD appointment      Intervention Plan   Intervention Prescribe, educate and counsel regarding individualized specific dietary modifications aiming towards targeted core components such as weight, hypertension, lipid management, diabetes, heart failure and other comorbidities.;Nutrition handout(s) given to patient.    Expected Outcomes Short Term Goal: Understand basic principles of dietary content, such as calories, fat, sodium, cholesterol and nutrients.;Short Term Goal: A plan has been developed with personal nutrition goals set during dietitian appointment.;Long Term Goal: Adherence to prescribed nutrition plan.             Nutrition Assessments:  MEDIFICTS Score Key: >=70 Need to make dietary changes  40-70 Heart Healthy Diet <= 40 Therapeutic Level Cholesterol Diet  Flowsheet Row Cardiac Rehab from 01/18/2023 in [facility] Cardiac and Pulmonary Rehab  Picture Your Plate  Total Score on Admission 64      Picture Your Plate Scores: <60 Unhealthy dietary pattern with much room for improvement. 41-50 Dietary pattern unlikely to meet recommendations for good health and room for improvement. 51-60 More healthful dietary pattern, with some room for improvement.  >60 Healthy dietary pattern, although there may be some specific behaviors that could be improved.    Nutrition Goals Re-Evaluation:   Nutrition Goals Discharge (Final Nutrition Goals Re-Evaluation):   Psychosocial: Target Goals: Acknowledge presence or absence of significant depression and/or stress, maximize coping skills, provide positive support system. Participant is able to verbalize types and ability to use techniques and skills needed for reducing stress and depression.   Education: Stress, Anxiety, and Depression - Group verbal and visual presentation to define topics covered.  Reviews how body is impacted by stress, anxiety, and depression.  Also discusses healthy ways to reduce stress and to treat/manage anxiety and depression.  Written material given at graduation. Flowsheet Row Cardiac Rehab from 01/26/2023 in [facility] Cardiac and Pulmonary Rehab  Education need identified 01/18/23       Education: Sleep Hygiene -Provides group verbal and written instruction about how sleep can affect your health.  Define sleep hygiene, discuss sleep cycles and impact of sleep habits. Review good sleep hygiene tips.    Initial Review & Psychosocial Screening:  Initial Psych Review & Screening - 01/12/23 1130       Initial Review   Current issues with None Identified      Family Dynamics   Good Support System? Yes   husband, grandson,  sister     Barriers   Psychosocial barriers to participate in program There are no identifiable barriers or psychosocial needs.      Screening Interventions   Interventions Encouraged to exercise;To provide support and resources with identified psychosocial  needs;Provide feedback about the scores to participant    Expected Outcomes Short Term goal: Utilizing psychosocial counselor, staff and physician to assist with identification of specific Stressors or current issues interfering with healing process. Setting desired goal for each stressor or current issue identified.;Long Term Goal: Stressors or current issues are controlled or eliminated.;Short Term goal: Identification and review with participant of any Quality of Life or Depression concerns found by scoring the questionnaire.;Long Term goal: The participant improves quality of Life and PHQ9 Scores as seen by post scores and/or verbalization of changes             Quality of Life Scores:   Quality of Life - 01/18/23 1107       Quality of Life   Select Quality of Life  Quality of Life Scores   Health/Function Pre 22.5 %    Socioeconomic Pre 26.88 %    Psych/Spiritual Pre 28.14 %    Family Pre 27.1 %    GLOBAL Pre 25.29 %            Scores of 19 and below usually indicate a poorer quality of life in these areas.  A difference of  2-3 points is a clinically meaningful difference.  A difference of 2-3 points in the total score of the Quality of Life Index has been associated with significant improvement in overall quality of life, self-image, physical symptoms, and general health in studies assessing change in quality of life.  PHQ-9: Review Flowsheet       01/18/2023 08/18/2017  Depression screen PHQ 2/9  Decreased Interest 0 0  Down, Depressed, Hopeless 0 0  PHQ - 2 Score 0 0  Altered sleeping 3 -  Tired, decreased energy 3 -  Change in appetite 1 -  Feeling bad or failure about yourself  0 -  Trouble concentrating 0 -  Moving slowly or fidgety/restless 0 -  Suicidal thoughts 0 -  PHQ-9 Score 7 -  Difficult doing work/chores Somewhat difficult -    Details           Interpretation of Total Score  Total Score Depression Severity:  1-4 = Minimal depression, 5-9  = Mild depression, 10-14 = Moderate depression, 15-19 = Moderately severe depression, 20-27 = Severe depression   Psychosocial Evaluation and Intervention:  Psychosocial Evaluation - 01/12/23 1149       Psychosocial Evaluation & Interventions   Interventions Encouraged to exercise with the program and follow exercise prescription    Comments There are no barriers to attending the program.  She does have exercise barriers.  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 Her doctor wants her to do the program.  She lives with her husband.  He helps her as needed and she has a daughter,grandson and her sister. for support. She has been dealing with multiple hospitalizations this year.   One being shoulder surgery, PT after surgery.  She developed a new tear in her shoulder and cannot have any surgery. She is ready to attend the program    Expected Outcomes STG attends all scheduled sessions, is able to see exercise progression even with her limited use of legs and 1 arm. LTG  able to continue her exercise progression.    Continue Psychosocial Services  Follow up required by staff             Psychosocial Re-Evaluation:   Psychosocial Discharge (Final Psychosocial Re-Evaluation):   Vocational Rehabilitation: Provide vocational rehab assistance to qualifying candidates.   Vocational Rehab Evaluation & Intervention:  Vocational Rehab - 01/12/23 1138       Initial Vocational Rehab Evaluation & Intervention   Assessment shows need for Vocational Rehabilitation No      Vocational Rehab Re-Evaulation   Comments retired      Discharge Vocational Rehab   Discharge Vocational Rehabilitation retired             Education: Education Goals: Education classes will be provided on a variety of topics geared toward better understanding of heart health and risk factor modification. Participant will state understanding/return  demonstration of topics presented as noted by education test scores.  Learning Barriers/Preferences:   General Cardiac Education Topics:  AED/CPR: -  Group verbal and written instruction with the use of models to demonstrate the basic use of the AED with the basic ABC's of resuscitation.   Anatomy and Cardiac Procedures: - Group verbal and visual presentation and models provide information about basic cardiac anatomy and function. Reviews the testing methods done to diagnose heart disease and the outcomes of the test results. Describes the treatment choices: Medical Management, Angioplasty, or Coronary Bypass Surgery for treating various heart conditions including Myocardial Infarction, Angina, Valve Disease, and Cardiac Arrhythmias.  Written material given at graduation. Flowsheet Row Cardiac Rehab from 01/26/2023 in [facility] Cardiac and Pulmonary Rehab  Education need identified 01/18/23       Medication Safety: - Group verbal and visual instruction to review commonly prescribed medications for heart and lung disease. Reviews the medication, class of the drug, and side effects. Includes the steps to properly store meds and maintain the prescription regimen.  Written material given at graduation.   Intimacy: - Group verbal instruction through game format to discuss how heart and lung disease can affect sexual intimacy. Written material given at graduation.. Flowsheet Row Cardiac Rehab from 01/26/2023 in [facility] Cardiac and Pulmonary Rehab  Education need identified 01/18/23       Know Your Numbers and Heart Failure: - Group verbal and visual instruction to discuss disease risk factors for cardiac and pulmonary disease and treatment options.  Reviews associated critical values for Overweight/Obesity, Hypertension, Cholesterol, and Diabetes.  Discusses basics of heart failure: signs/symptoms and treatments.  Introduces Heart Failure Zone chart for action plan for heart failure.  Written  material given at graduation.   Infection Prevention: - Provides verbal and written material to individual with discussion of infection control including proper hand washing and proper equipment cleaning during exercise session. Flowsheet Row Cardiac Rehab from 01/26/2023 in [facility] Cardiac and Pulmonary Rehab  Date 01/18/23  Educator MB  Instruction Review Code 1- Verbalizes Understanding       Falls Prevention: - Provides verbal and written material to individual with discussion of falls prevention and safety. Flowsheet Row Cardiac Rehab from 01/26/2023 in [facility] Cardiac and Pulmonary Rehab  Date 01/18/23  Educator MB  Instruction Review Code 1- Verbalizes Understanding       Other: -Provides group and verbal instruction on various topics (see comments)   Knowledge Questionnaire Score:  Knowledge Questionnaire Score - 01/18/23 1110       Knowledge Questionnaire Score   Pre Score 18/26             Core Components/Risk Factors/Patient Goals at Admission:  Personal Goals and Risk Factors at Admission - 01/18/23 1110       Core Components/Risk Factors/Patient Goals on Admission    Weight Management Yes;Weight Loss    Intervention Weight Management: Develop a combined nutrition and exercise program designed to reach desired caloric intake, while maintaining appropriate intake of nutrient and fiber, sodium and fats, and appropriate energy expenditure required for the weight goal.;Weight Management: Provide education and appropriate resources to help participant work on and attain dietary goals.;Weight Management/Obesity: Establish reasonable short term and long term weight goals.    Admit Weight 245 lb 3.2 oz (111.2 kg)    Goal Weight: Short Term 240 lb (108.9 kg)    Goal Weight: Long Term 180 lb (81.6 kg)    Expected Outcomes Short Term: Continue to assess and modify interventions until short term weight is achieved;Long Term: Adherence to nutrition and physical  activity/exercise program aimed toward attainment of established weight goal;Weight Loss:  Understanding of general recommendations for a balanced deficit meal plan, which promotes 1-2 lb weight loss per week and includes a negative energy balance of [phone] kcal/d;Understanding recommendations for meals to include 15-35% energy as protein, 25-35% energy from fat, 35-60% energy from carbohydrates, less than 200mg  of dietary cholesterol, 20-35 gm of total fiber daily;Understanding of distribution of calorie intake throughout the day with the consumption of 4-5 meals/snacks    Hypertension Yes    Intervention Provide education on lifestyle modifcations including regular physical activity/exercise, weight management, moderate sodium restriction and increased consumption of fresh fruit, vegetables, and low fat dairy, alcohol moderation, and smoking cessation.;Monitor prescription use compliance.    Expected Outcomes Short Term: Continued assessment and intervention until BP is < 140/41mm HG in hypertensive participants. < 130/46mm HG in hypertensive participants with diabetes, heart failure or chronic kidney disease.;Long Term: Maintenance of blood pressure at goal levels.    Lipids Yes    Intervention Provide education and support for participant on nutrition & aerobic/resistive exercise along with prescribed medications to achieve LDL 70mg , HDL >40mg .    Expected Outcomes Short Term: Participant states understanding of desired cholesterol values and is compliant with medications prescribed. Participant is following exercise prescription and nutrition guidelines.;Long Term: Cholesterol controlled with medications as prescribed, with individualized exercise RX and with personalized nutrition plan. Value goals: LDL < 70mg , HDL > 40 mg.             Education:Diabetes - Individual verbal and written instruction to review signs/symptoms of diabetes, desired ranges of glucose level fasting, after meals and  with exercise. Acknowledge that pre and post exercise glucose checks will be done for 3 sessions at entry of program.   Core Components/Risk Factors/Patient Goals Review:    Core Components/Risk Factors/Patient Goals at Discharge (Final Review):    ITP Comments:  ITP Comments     Row Name 01/12/23 1156 01/18/23 1059 01/26/23 0819 02/01/23 0927     ITP Comments Virtual orientation call completed today. shehas an appointment on Date: 01/18/2023  for EP eval and gym Orientation.  Documentation of diagnosis can be found in [facility] Date: 12/20/2022 . Completed and gym orientation. Initial ITP created and sent for review to Dr. Bethann Punches, Medical Director. First full day of exercise!  Patient was oriented to gym and equipment including functions, settings, policies, and procedures.  Patient's individual exercise prescription and treatment plan were reviewed.  All starting workloads were established based on the results of the 6 minute walk test done at initial orientation visit.  The plan for exercise progression was also introduced and progression will be customized based on patient's performance and goals. 30 Day review completed. Medical Director ITP review done, changes made as directed, and signed approval by Medical Director. new to program             Comments:

## 2023-02-02 ENCOUNTER — Encounter: Payer: PPO | Admitting: *Deleted

## 2023-02-02 DIAGNOSIS — I213 ST elevation (STEMI) myocardial infarction of unspecified site: Secondary | ICD-10-CM | POA: Diagnosis not present

## 2023-02-02 DIAGNOSIS — Z955 Presence of coronary angioplasty implant and graft: Secondary | ICD-10-CM

## 2023-02-02 NOTE — Progress Notes (Signed)
Daily Session Note  Patient Details  Name: Jacqueline Rhodes MRN: [id number] Date of Birth: 10/30/1955 Referring Provider:   Flowsheet Row Cardiac Rehab from 01/18/2023 in [facility] Cardiac and Pulmonary Rehab  Referring Provider Tiajuana Amass, MD       Encounter Date: 02/02/2023  Check In:  Session Check In - 02/02/23 0801       Check-In   Supervising physician immediately available to respond to emergencies See telemetry face sheet for immediately available ER MD    Location [facility]-Cardiac & Pulmonary Rehab    Staff Present Ronette Deter, BS, Exercise Physiologist;Joseph Nankin, RCP,RRT,BSRT;Maxon Edgard BS, , Exercise Physiologist;Sylvan Sookdeo Katrinka Blazing, RN, [place]    Virtual Visit No    Medication changes reported     No    Fall or balance concerns reported    No    Warm-up and Cool-down Performed on first and last piece of equipment    Resistance Training Performed Yes    VAD Patient? No    PAD/SET Patient? No      Pain Assessment   Currently in Pain? No/denies                Social History   Tobacco Use  Smoking Status Former   Current packs/day: 0.00   Average packs/day: 0.5 packs/day for 30.0 years (15.0 ttl pk-yrs)   Types: Cigarettes   Start date: 30   Quit date: 2005   Years since quitting: 19.9  Smokeless Tobacco Never    Goals Met:  Independence with exercise equipment Exercise tolerated well No report of concerns or symptoms today Strength training completed today  Goals Unmet:  Not Applicable  Comments: Pt able to follow exercise prescription today without complaint.  Will continue to monitor for progression.    Dr. Bethann Punches is Medical Director for [facility] Cardiac Rehabilitation.  Dr. Vida Rigger is Medical Director for [facility] Pulmonary Rehabilitation.

## 2023-02-07 ENCOUNTER — Encounter: Payer: Self-pay | Admitting: Emergency Medicine

## 2023-02-07 ENCOUNTER — Encounter: Payer: PPO | Admitting: *Deleted

## 2023-02-07 ENCOUNTER — Emergency Department
Admission: EM | Admit: 2023-02-07 | Discharge: 2023-02-07 | Disposition: A | Discharge status: Home / Self Care | Payer: PPO | Attending: Emergency Medicine | Admitting: Emergency Medicine

## 2023-02-07 ENCOUNTER — Other Ambulatory Visit: Payer: Self-pay

## 2023-02-07 DIAGNOSIS — T7840XA Allergy, unspecified, initial encounter: Secondary | ICD-10-CM | POA: Diagnosis not present

## 2023-02-07 DIAGNOSIS — Z955 Presence of coronary angioplasty implant and graft: Secondary | ICD-10-CM | POA: Insufficient documentation

## 2023-02-07 DIAGNOSIS — X58XXXA Exposure to other specified factors, initial encounter: Secondary | ICD-10-CM | POA: Insufficient documentation

## 2023-02-07 DIAGNOSIS — I213 ST elevation (STEMI) myocardial infarction of unspecified site: Secondary | ICD-10-CM

## 2023-02-07 DIAGNOSIS — E119 Type 2 diabetes mellitus without complications: Secondary | ICD-10-CM | POA: Diagnosis not present

## 2023-02-07 DIAGNOSIS — I252 Old myocardial infarction: Secondary | ICD-10-CM | POA: Diagnosis not present

## 2023-02-07 DIAGNOSIS — E782 Mixed hyperlipidemia: Secondary | ICD-10-CM | POA: Diagnosis not present

## 2023-02-07 MED ORDER — METHYLPREDNISOLONE SODIUM SUCC 125 MG IJ SOLR
125.0000 mg | Freq: Once | INTRAMUSCULAR | Status: AC
  Administered 2023-02-07: 125 mg via INTRAMUSCULAR
  Filled 2023-02-07: qty 2

## 2023-02-07 MED ORDER — PREDNISONE 50 MG PO TABS: 50.0000 mg | ORAL_TABLET | Freq: Every day | ORAL | 0 refills | Status: AC

## 2023-02-07 MED ORDER — DIPHENHYDRAMINE HCL 50 MG/ML IJ SOLN
25.0000 mg | Freq: Once | INTRAMUSCULAR | Status: AC
  Administered 2023-02-07: 25 mg via INTRAMUSCULAR
  Filled 2023-02-07: qty 1

## 2023-02-07 NOTE — ED Triage Notes (Signed)
Pt via POV from home. Pt noticed her tongue swelling 1 hour ago. Pt states this is the 4th time this has happened. Initially to losartan. States that she has never been intubated but states last time they gave her an epi-pen and it caused her to have a heart attack and she will not take that medication again. Pt is A&Ox4 and NAD, ambulatory to triage

## 2023-02-07 NOTE — ED Provider Notes (Signed)
[facility] Provider Note   Event Date/Time   First MD Initiated Contact with Patient 02/07/23 1756     (approximate) History  Allergic Reaction  HPI Jacqueline Rhodes is a 67 y.o. female with a stated past medical history of recurrent allergic reaction with tongue swelling who presents with similar symptoms today including swelling of the left side of the tongue.  Patient states that she is unable to use epinephrine as the last time she did she had a heart attack.  Patient has not tried any medications to resolve the symptoms.  Patient denies any known allergic contacts. ROS: Patient currently denies any vision changes, tinnitus, difficulty speaking, facial droop, sore throat, chest pain, shortness of breath, abdominal pain, nausea/vomiting/diarrhea, dysuria, or weakness/numbness/paresthesias in any extremity   Physical Exam  Triage Vital Signs: ED Triage Vitals  Encounter Vitals Group     BP 02/07/23 1750 138/70     Systolic BP Percentile --      Diastolic BP Percentile --      Pulse Rate 02/07/23 1750 62     Resp 02/07/23 1750 20     Temp 02/07/23 1750 98.5 F (36.9 C)     Temp Source 02/07/23 1750 Oral     SpO2 02/07/23 1750 98 %     Weight 02/07/23 1749 242 lb (109.8 kg)     Height 02/07/23 1749 5' 4.5" (1.638 m)     Head Circumference --      Peak Flow --      Pain Score 02/07/23 1748 0     Pain Loc --      Pain Education --      Exclude from Growth Chart --    Most recent vital signs: Vitals:   02/07/23 1959 02/07/23 2024  BP: (!) 144/101 (!) 144/98  Pulse: 66   Resp: 18 18  Temp: 98.3 F (36.8 C) 98.3 F (36.8 C)  SpO2: 98% 98%   General: Awake, oriented x4. CV:  Good peripheral perfusion.  Resp:  Normal effort.  Abd:  No distention.  Other:  Elderly morbidly obese Caucasian female resting comfortably in no acute distress.  No stridor appreciated.  There is edema to the left aspect of the tongue ED Results / Procedures / Treatments   Labs (all labs ordered are listed, but only abnormal results are displayed) Labs Reviewed - No data to display PROCEDURES: Critical Care performed: No .1-3 Lead EKG Interpretation  Performed by: Merwyn Katos, MD Authorized by: Merwyn Katos, MD     Interpretation: normal     ECG rate:  71   ECG rate assessment: normal     Rhythm: sinus rhythm     Ectopy: none     Conduction: normal    MEDICATIONS ORDERED IN ED: Medications  diphenhydrAMINE (BENADRYL) injection 25 mg (25 mg Intramuscular Given 02/07/23 1929)  methylPREDNISolone sodium succinate (SOLU-MEDROL) 125 mg/2 mL injection 125 mg (125 mg Intramuscular Given 02/07/23 1929)   IMPRESSION / MDM / ASSESSMENT AND PLAN / ED COURSE  I reviewed the triage vital signs and the nursing notes.                             The patient is on the cardiac monitor to evaluate for evidence of arrhythmia and/or significant heart rate changes. Patient's presentation is most consistent with acute presentation with potential threat to life or bodily function. + Left-sided tongue swelling  No evidence of multiorgan involvement  Given history and exam, presentation most consistent with allergic reaction. I have low suspicion for toxic shock syndrome, anaphylaxis, asthma exacerbation, or drug toxicity. Rx: Prednisone 60mg  qday x3days, Benadryl 25mg  q8hr x3days Disposition: Discharge home with SRP. Follow up with PCP in 1-2 days.   FINAL CLINICAL IMPRESSION(S) / ED DIAGNOSES   Final diagnoses:  Allergic reaction, initial encounter   Rx / DC Orders   ED Discharge Orders          Ordered    predniSONE (DELTASONE) 50 MG tablet  Daily with breakfast        02/07/23 2016           Note:  This document was prepared using Dragon voice recognition software and may include unintentional dictation errors.   Merwyn Katos, MD 02/07/23 [phone]

## 2023-02-07 NOTE — Progress Notes (Signed)
Daily Session Note  Patient Details  Name: Jacqueline Rhodes MRN: [id number] Date of Birth: Feb 18, 1956 Referring Provider:   Flowsheet Row Cardiac Rehab from 01/18/2023 in [facility] Cardiac and Pulmonary Rehab  Referring Provider Tiajuana Amass, MD       Encounter Date: 02/07/2023  Check In:  Session Check In - 02/07/23 0825       Check-In   Supervising physician immediately available to respond to emergencies See telemetry face sheet for immediately available ER MD    Location [facility]-Cardiac & Pulmonary Rehab    Staff Present Rory Percy, MS, Exercise Physiologist;Maxon Conetta BS, , Exercise Physiologist;Noah Tickle, BS, Exercise Physiologist;Basel Defalco, RN, BSN, CCRP    Virtual Visit No    Medication changes reported     No    Fall or balance concerns reported    No    Warm-up and Cool-down Performed on first and last piece of equipment    Resistance Training Performed Yes    VAD Patient? No    PAD/SET Patient? No      Pain Assessment   Currently in Pain? No/denies                Social History   Tobacco Use  Smoking Status Former   Current packs/day: 0.00   Average packs/day: 0.5 packs/day for 30.0 years (15.0 ttl pk-yrs)   Types: Cigarettes   Start date: 62   Quit date: 2005   Years since quitting: 19.9  Smokeless Tobacco Never    Goals Met:  Independence with exercise equipment Exercise tolerated well No report of concerns or symptoms today  Goals Unmet:  Not Applicable  Comments: Pt able to follow exercise prescription today without complaint.  Will continue to monitor for progression.    Dr. Bethann Punches is Medical Director for [facility] Cardiac Rehabilitation.  Dr. Vida Rigger is Medical Director for [facility] Pulmonary Rehabilitation.

## 2023-02-14 ENCOUNTER — Encounter: Payer: PPO | Attending: Cardiovascular Disease | Admitting: *Deleted

## 2023-02-14 DIAGNOSIS — I213 ST elevation (STEMI) myocardial infarction of unspecified site: Secondary | ICD-10-CM | POA: Diagnosis not present

## 2023-02-14 DIAGNOSIS — Z955 Presence of coronary angioplasty implant and graft: Secondary | ICD-10-CM | POA: Insufficient documentation

## 2023-02-14 NOTE — Progress Notes (Signed)
Daily Session Note  Patient Details  Name: Jacqueline Rhodes MRN: [id number] Date of Birth: [birth year] Referring Provider:   Flowsheet Row Cardiac Rehab from 01/18/2023 in [facility] Cardiac and Pulmonary Rehab  Referring Provider Tiajuana Amass, MD       Encounter Date: 02/14/2023  Check In:  Session Check In - 02/14/23 0816       Check-In   Supervising physician immediately available to respond to emergencies See telemetry face sheet for immediately available ER MD    Location [facility]-Cardiac & Pulmonary Rehab    Staff Present Cora Collum, RN, BSN, CCRP;Margaret Best, MS, Exercise Physiologist;Maxon Conetta BS, , Exercise Physiologist;Noah Tickle, BS, Exercise Physiologist    Virtual Visit No    Medication changes reported     No    Fall or balance concerns reported    No    Warm-up and Cool-down Performed on first and last piece of equipment    Resistance Training Performed Yes    VAD Patient? No    PAD/SET Patient? No      Pain Assessment   Currently in Pain? No/denies                Social History   Tobacco Use  Smoking Status Former   Current packs/day: 0.00   Average packs/day: 0.5 packs/day for 30.0 years (15.0 ttl pk-yrs)   Types: Cigarettes   Start date: 59   Quit date: 2005   Years since quitting: 19.9  Smokeless Tobacco Never    Goals Met:  Independence with exercise equipment Exercise tolerated well No report of concerns or symptoms today  Goals Unmet:  Not Applicable  Comments: Pt able to follow exercise prescription today without complaint.  Will continue to monitor for progression.    Dr. Bethann Punches is Medical Director for [facility] Cardiac Rehabilitation.  Dr. Vida Rigger is Medical Director for [facility] Pulmonary Rehabilitation.

## 2023-02-15 DIAGNOSIS — Z79899 Other long term (current) drug therapy: Secondary | ICD-10-CM | POA: Diagnosis not present

## 2023-02-15 DIAGNOSIS — L4050 Arthropathic psoriasis, unspecified: Secondary | ICD-10-CM | POA: Diagnosis not present

## 2023-02-16 ENCOUNTER — Encounter: Payer: PPO | Admitting: *Deleted

## 2023-02-16 DIAGNOSIS — I213 ST elevation (STEMI) myocardial infarction of unspecified site: Secondary | ICD-10-CM

## 2023-02-16 DIAGNOSIS — Z955 Presence of coronary angioplasty implant and graft: Secondary | ICD-10-CM

## 2023-02-16 NOTE — Progress Notes (Signed)
Daily Session Note  Patient Details  Name: Jacqueline Rhodes MRN: [id number] Date of Birth: June 04, 1955 Referring Provider:   Flowsheet Row Cardiac Rehab from 01/18/2023 in [facility] Cardiac and Pulmonary Rehab  Referring Provider Tiajuana Amass, MD       Encounter Date: 02/16/2023  Check In:  Session Check In - 02/16/23 0751       Check-In   Supervising physician immediately available to respond to emergencies See telemetry face sheet for immediately available ER MD    Location [facility]-Cardiac & Pulmonary Rehab    Staff Present Cora Collum, RN, BSN, CCRP;Noah Tickle, BS, Exercise Physiologist;Joseph Hood, RCP,RRT,BSRT;Maxon Anahola BS, , Exercise Physiologist    Virtual Visit No    Medication changes reported     No    Fall or balance concerns reported    No    Warm-up and Cool-down Performed on first and last piece of equipment    Resistance Training Performed Yes    VAD Patient? No    PAD/SET Patient? No      Pain Assessment   Currently in Pain? No/denies                Social History   Tobacco Use  Smoking Status Former   Current packs/day: 0.00   Average packs/day: 0.5 packs/day for 30.0 years (15.0 ttl pk-yrs)   Types: Cigarettes   Start date: 54   Quit date: 2005   Years since quitting: 19.9  Smokeless Tobacco Never    Goals Met:  Independence with exercise equipment Exercise tolerated well No report of concerns or symptoms today  Goals Unmet:  Not Applicable  Comments: Pt able to follow exercise prescription today without complaint.  Will continue to monitor for progression.    Dr. Bethann Punches is Medical Director for [facility] Cardiac Rehabilitation.  Dr. Vida Rigger is Medical Director for [facility] Pulmonary Rehabilitation.

## 2023-02-20 DIAGNOSIS — E119 Type 2 diabetes mellitus without complications: Secondary | ICD-10-CM | POA: Diagnosis not present

## 2023-02-20 DIAGNOSIS — M0579 Rheumatoid arthritis with rheumatoid factor of multiple sites without organ or systems involvement: Secondary | ICD-10-CM | POA: Diagnosis not present

## 2023-02-20 DIAGNOSIS — T783XXD Angioneurotic edema, subsequent encounter: Secondary | ICD-10-CM | POA: Diagnosis not present

## 2023-02-20 DIAGNOSIS — E782 Mixed hyperlipidemia: Secondary | ICD-10-CM | POA: Diagnosis not present

## 2023-02-20 DIAGNOSIS — I10 Essential (primary) hypertension: Secondary | ICD-10-CM | POA: Diagnosis not present

## 2023-02-20 DIAGNOSIS — G501 Atypical facial pain: Secondary | ICD-10-CM | POA: Diagnosis not present

## 2023-02-20 DIAGNOSIS — H9202 Otalgia, left ear: Secondary | ICD-10-CM | POA: Diagnosis not present

## 2023-02-20 DIAGNOSIS — I25111 Atherosclerotic heart disease of native coronary artery with angina pectoris with documented spasm: Secondary | ICD-10-CM | POA: Diagnosis not present

## 2023-02-20 DIAGNOSIS — E039 Hypothyroidism, unspecified: Secondary | ICD-10-CM | POA: Diagnosis not present

## 2023-02-20 DIAGNOSIS — L4050 Arthropathic psoriasis, unspecified: Secondary | ICD-10-CM | POA: Diagnosis not present

## 2023-02-20 DIAGNOSIS — N1831 Chronic kidney disease, stage 3a: Secondary | ICD-10-CM | POA: Diagnosis not present

## 2023-02-21 ENCOUNTER — Ambulatory Visit
Admission: RE | Admit: 2023-02-21 | Discharge: 2023-02-21 | Disposition: A | Discharge status: Home / Self Care | Payer: PPO | Source: Ambulatory Visit | Attending: Physician Assistant | Admitting: Physician Assistant

## 2023-02-21 ENCOUNTER — Other Ambulatory Visit: Payer: Self-pay | Admitting: Physician Assistant

## 2023-02-21 ENCOUNTER — Encounter: Payer: PPO | Admitting: *Deleted

## 2023-02-21 DIAGNOSIS — Z48812 Encounter for surgical aftercare following surgery on the circulatory system: Secondary | ICD-10-CM | POA: Insufficient documentation

## 2023-02-21 DIAGNOSIS — I252 Old myocardial infarction: Secondary | ICD-10-CM | POA: Insufficient documentation

## 2023-02-21 DIAGNOSIS — I6782 Cerebral ischemia: Secondary | ICD-10-CM | POA: Diagnosis not present

## 2023-02-21 DIAGNOSIS — Z955 Presence of coronary angioplasty implant and graft: Secondary | ICD-10-CM | POA: Insufficient documentation

## 2023-02-21 DIAGNOSIS — G501 Atypical facial pain: Secondary | ICD-10-CM | POA: Diagnosis not present

## 2023-02-21 DIAGNOSIS — H9202 Otalgia, left ear: Secondary | ICD-10-CM | POA: Diagnosis not present

## 2023-02-21 DIAGNOSIS — I213 ST elevation (STEMI) myocardial infarction of unspecified site: Secondary | ICD-10-CM

## 2023-02-21 DIAGNOSIS — H519 Unspecified disorder of binocular movement: Secondary | ICD-10-CM | POA: Diagnosis not present

## 2023-02-21 MED ORDER — GADOBUTROL 1 MMOL/ML IV SOLN
10.0000 mL | Freq: Once | INTRAVENOUS | Status: AC | PRN
  Administered 2023-02-21: 10 mL via INTRAVENOUS

## 2023-02-21 NOTE — Progress Notes (Signed)
Daily Session Note  Patient Details  Name: Jacqueline Rhodes MRN: [id number] Date of Birth: [birth year] Referring Provider:   Flowsheet Row Cardiac Rehab from 01/18/2023 in [facility] Cardiac and Pulmonary Rehab  Referring Provider Tiajuana Amass, MD       Encounter Date: 02/21/2023  Check In:  Session Check In - 02/21/23 0809       Check-In   Supervising physician immediately available to respond to emergencies See telemetry face sheet for immediately available ER MD    Location [facility]-Cardiac & Pulmonary Rehab    Staff Present Maxon Conetta BS, , Exercise Physiologist;Noah Tickle, BS, Exercise Physiologist;Primo Innis, RN, BSN, CCRP    Virtual Visit No    Medication changes reported     No    Fall or balance concerns reported    No    Warm-up and Cool-down Performed on first and last piece of equipment    Resistance Training Performed Yes    VAD Patient? No    PAD/SET Patient? No      Pain Assessment   Currently in Pain? No/denies                Social History   Tobacco Use  Smoking Status Former   Current packs/day: 0.00   Average packs/day: 0.5 packs/day for 30.0 years (15.0 ttl pk-yrs)   Types: Cigarettes   Start date: 45   Quit date: 2005   Years since quitting: 19.9  Smokeless Tobacco Never    Goals Met:  Independence with exercise equipment Exercise tolerated well No report of concerns or symptoms today  Goals Unmet:  Not Applicable  Comments: Pt able to follow exercise prescription today without complaint.  Will continue to monitor for progression.    Dr. Bethann Punches is Medical Director for [facility] Cardiac Rehabilitation.  Dr. Vida Rigger is Medical Director for [facility] Pulmonary Rehabilitation.

## 2023-02-23 ENCOUNTER — Encounter: Payer: PPO | Admitting: *Deleted

## 2023-02-23 DIAGNOSIS — I213 ST elevation (STEMI) myocardial infarction of unspecified site: Secondary | ICD-10-CM

## 2023-02-23 DIAGNOSIS — Z955 Presence of coronary angioplasty implant and graft: Secondary | ICD-10-CM

## 2023-02-23 NOTE — Progress Notes (Signed)
Daily Session Note  Patient Details  Name: Jacqueline Rhodes MRN: [id number] Date of Birth: [birth year] Referring Provider:   Flowsheet Row Cardiac Rehab from 01/18/2023 in [facility] Cardiac and Pulmonary Rehab  Referring Provider Tiajuana Amass, MD       Encounter Date: 02/23/2023  Check In:  Session Check In - 02/23/23 0748       Check-In   Supervising physician immediately available to respond to emergencies See telemetry face sheet for immediately available ER MD    Location [facility]-Cardiac & Pulmonary Rehab    Staff Present Ronette Deter, BS, Exercise Physiologist;Joseph Amboy, RCP,RRT,BSRT;Maxon Naples BS, , Exercise Physiologist;Maryah Marinaro Katrinka Blazing, RN, [place]    Virtual Visit No    Medication changes reported     No    Fall or balance concerns reported    No    Warm-up and Cool-down Performed on first and last piece of equipment    Resistance Training Performed Yes    VAD Patient? No    PAD/SET Patient? No      Pain Assessment   Currently in Pain? No/denies                Social History   Tobacco Use  Smoking Status Former   Current packs/day: 0.00   Average packs/day: 0.5 packs/day for 30.0 years (15.0 ttl pk-yrs)   Types: Cigarettes   Start date: 70   Quit date: 2005   Years since quitting: 19.9  Smokeless Tobacco Never    Goals Met:  Independence with exercise equipment Exercise tolerated well No report of concerns or symptoms today Strength training completed today  Goals Unmet:  Not Applicable  Comments: Pt able to follow exercise prescription today without complaint.  Will continue to monitor for progression.    Dr. Bethann Punches is Medical Director for [facility] Cardiac Rehabilitation.  Dr. Vida Rigger is Medical Director for [facility] Pulmonary Rehabilitation.

## 2023-02-28 ENCOUNTER — Encounter: Payer: PPO | Admitting: *Deleted

## 2023-02-28 DIAGNOSIS — Z955 Presence of coronary angioplasty implant and graft: Secondary | ICD-10-CM

## 2023-02-28 DIAGNOSIS — I213 ST elevation (STEMI) myocardial infarction of unspecified site: Secondary | ICD-10-CM | POA: Diagnosis not present

## 2023-02-28 NOTE — Progress Notes (Signed)
Daily Session Note  Patient Details  Name: Jacqueline Rhodes MRN: [id number] Date of Birth: [birth year] Referring Provider:   Flowsheet Row Cardiac Rehab from 01/18/2023 in [facility] Card[facility]  Referring Provider Tiajuana Amass, MD       Encounter Date: 02/28/2023  Check In:  Session Check In - 02/28/23 0817       Check-In   Supervising physician immediately available to respond to emergencies See telemetry face sheet for immediately available ER MD    Location [facility]-Cardiac & Pulmonary Rehab    Staff Present Rory Percy, MS, Exercise Physiologist;Buel Molder, RN, BSN, CCRP;Noah Tickle, BS, Exercise Physiologist;Maxon Conetta BS, , Exercise Physiologist    Virtual Visit No    Medication changes reported     No    Fall or balance concerns reported    No    Warm-up and Cool-down Performed on first and last piece of equipment    Resistance Training Performed Yes    VAD Patient? No    PAD/SET Patient? No      Pain Assessment   Currently in Pain? No/denies                Social History   Tobacco Use  Smoking Status Former   Current packs/day: 0.00   Average packs/day: 0.5 packs/day for 30.0 years (15.0 ttl pk-yrs)   Types: Cigarettes   Start date: 56   Quit date: 2005   Years since quitting: 19.9  Smokeless Tobacco Never    Goals Met:  Independence with exercise equipment Exercise tolerated well No report of concerns or symptoms today  Goals Unmet:  Not Applicable  Comments: Pt able to follow exercise prescription today without complaint.  Will continue to monitor for progression.    Dr. Bethann Punches is Medical Director for [facility] Cardiac Rehabilitation.  Dr. Vida Rigger is Medical Director for [facility] Pul[facility].

## 2023-03-01 ENCOUNTER — Encounter: Payer: Self-pay | Admitting: *Deleted

## 2023-03-01 DIAGNOSIS — Z955 Presence of coronary angioplasty implant and graft: Secondary | ICD-10-CM

## 2023-03-01 DIAGNOSIS — I213 ST elevation (STEMI) myocardial infarction of unspecified site: Secondary | ICD-10-CM

## 2023-03-01 NOTE — Progress Notes (Signed)
Cardiac Individual Treatment Plan  Patient Details  Name: MISHELE WEIK MRN: [id number] Date of Birth: 11/03/1955 Referring Provider:   Flowsheet Row Cardiac Rehab from 01/18/2023 in [facility] Cardiac and Pulmonary Rehab  Referring Provider Tiajuana Amass, MD       Initial Encounter Date:  Flowsheet Row Cardiac Rehab from 01/18/2023 in [facility] Cardiac and Pulmonary Rehab  Date 01/18/23       Visit Diagnosis: ST elevation myocardial infarction (STEMI), unspecified artery [facility])  Status post coronary artery stent placement  Patient's Home Medications on Admission:  Current Outpatient Medications:    acetaminophen (TYLENOL) 500 MG tablet, Take 2 tablets (1,000 mg total) by mouth every 8 (eight) hours. (Patient taking differently: Take 1,000 mg by mouth every 8 (eight) hours as needed for mild pain (pain score 1-3), moderate pain (pain score 4-6), fever or headache.), Disp: 90 tablet, Rfl: 2   albuterol (PROVENTIL HFA;VENTOLIN HFA) 108 (90 Base) MCG/ACT inhaler, Inhale 2 puffs into the lungs every 6 (six) hours as needed for wheezing or shortness of breath. , Disp: , Rfl:    aspirin 81 MG chewable tablet, Chew 1 tablet (81 mg total) by mouth daily., Disp: 30 tablet, Rfl: 0   Calcium Fructoborate POWD, 20 mcg by Does not apply route. Cream, Disp: , Rfl:    Cetirizine HCl 10 MG CAPS, Take 10 mg by mouth daily. , Disp: , Rfl:    Cholecalciferol 25 MCG (1000 UT) TBDP, Take 125 mcg by mouth., Disp: , Rfl:    Coenzyme Q10 (COQ-10) 100 MG CAPS, Take 100 mg by mouth daily. , Disp: , Rfl:    ezetimibe (ZETIA) 10 MG tablet, Take 1 tablet (10 mg total) by mouth daily., Disp: 30 tablet, Rfl: 0   fluticasone furoate-vilanterol (BREO ELLIPTA) 100-25 MCG/INH AEPB, Inhale 1 puff into the lungs daily. , Disp: , Rfl:    folic acid (FOLVITE) 1 MG tablet, Take 1 mg by mouth daily., Disp: , Rfl:    furosemide (LASIX) 20 MG tablet, Take 20 mg by mouth daily as needed for fluid or edema., Disp: , Rfl:     isosorbide mononitrate (IMDUR) 60 MG 24 hr tablet, Take 2 tablets (120 mg total) by mouth daily., Disp: 60 tablet, Rfl: 0   levothyroxine (SYNTHROID) 150 MCG tablet, Take 150 mcg by mouth daily before breakfast., Disp: , Rfl:    meclizine (ANTIVERT) 25 MG tablet, Take 25 mg by mouth 3 (three) times daily as needed for dizziness., Disp: , Rfl:    methotrexate (RHEUMATREX) 2.5 MG tablet, Take 20 mg by mouth every Thursday. , Disp: , Rfl:    metoprolol succinate (TOPROL-XL) 25 MG 24 hr tablet, Take 1 tablet (25 mg total) by mouth 2 (two) times daily., Disp: 60 tablet, Rfl: 0   nitroGLYCERIN (NITROSTAT) 0.4 MG SL tablet, Place 1 tablet (0.4 mg total) under the tongue once every 5 (five) minutes as needed for chest pain. (Max of 3 doses in 15 minutes)., Disp: 25 tablet, Rfl: 0   pantoprazole (PROTONIX) 40 MG tablet, Take 40 mg by mouth 2 (two) times daily., Disp: , Rfl:    pravastatin (PRAVACHOL) 20 MG tablet, Take 1 tablet (20 mg total) by mouth daily., Disp: 30 tablet, Rfl: 0   Secukinumab (COSENTYX SENSOREADY 300 DOSE) 150 MG/ML SOAJ, Inject 300 mg into the skin every 28 (twenty-eight) days.  (Patient not taking: Reported on 01/12/2023), Disp: , Rfl:    spironolactone (ALDACTONE) 25 MG tablet, Take 0.5 tablet (12.5 mg total) by mouth  daily., Disp: 15 tablet, Rfl: 0   ticagrelor (BRILINTA) 90 MG TABS tablet, Take 1 tablet (90 mg total) by mouth 2 (two) times daily., Disp: 60 tablet, Rfl: 0   UNABLE TO FIND, Take 1.5 drops by mouth daily at 2 PM. Med Name: B12  5,000 unit, B6 3400 unit B1  and Niacin, Disp: , Rfl:   Past Medical History: Past Medical History:  Diagnosis Date   Anemia    Anginal pain (HCC)    Aortic atherosclerosis (HCC)    Asthma    B12 deficiency    Carpal tunnel syndrome    CHF (congestive heart failure) (HCC)    Chronic venous insufficiency    Complete tear of right rotator cuff    COPD (chronic obstructive pulmonary disease) (HCC)    Coronary artery disease 01/07/2005    a.) LHC 01/07/2005 (NSTEMI): LHC 60% pLAD, 70% D1, 30% pLCx, 20% pRCA, 25% dRCA - med mgmt; b.) LHC ([place]) 01/10/2008: 25/60% pLAD, 70% D1, 20% pLCx, 20% mRCA --> FFR normal --> med mgmt; c.) LHC ([place]) 01/04/2012:: 40/60% pLAD, 70% D1, 25% pLCx, 20% pRCA, 20% mRCA, 25% dRCA --> FFR normal --> med mgmt; d.) LHC ([place]) 01/10/2018: 20% pRCA, 50% pLAD, 55% oD1 -- med mgmt   Diverticulosis    DOE (dyspnea on exertion)    GERD (gastroesophageal reflux disease)    History of bilateral cataract extraction 2014   Hyperlipidemia    Hypertension    Hypothyroidism    a.) s/p LEFT hemithyroidectomy; on levothyroxine   Long term current use of aspirin    Long term current use of immunosuppressive drug    a.) MTX + secukinumab for RA/psoriatric arthritis diagnoses   Lower extremity edema    Lymphedema    NSTEMI (non-ST elevated myocardial infarction) (HCC) 01/06/2005   a.) LHC 01/07/2005: 60% pLAD, 70% D1, 30% pLCx, 20% pRCA, 25% dRCA -- med mgmt   OSA on CPAP    a.) compliance issues with prescribed nocturnal PAP therapy   Osteoarthritis    Peripheral vascular disease (HCC)    Post-menopausal osteoporosis    Psoriatic arthritis (HCC)    RA (rheumatoid arthritis) (HCC)    Renal insufficiency    T2DM (type 2 diabetes mellitus) (HCC)    Thyroid cancer (HCC)    a.) s/p LEFT hemithyroidectomy 08/2015    Tobacco Use: Social History   Tobacco Use  Smoking Status Former   Current packs/day: 0.00   Average packs/day: 0.5 packs/day for 30.0 years (15.0 ttl pk-yrs)   Types: Cigarettes   Start date: 64   Quit date: 2005   Years since quitting: 19.9  Smokeless Tobacco Never    Labs: Review Flowsheet       Latest Ref Rng & Units 11/11/2022 12/21/2022  Labs for ITP Cardiac and Pulmonary Rehab  Cholestrol 0 - 200 mg/dL - 409   LDL (calc) 0 - 99 mg/dL - 811   HDL-C >91 mg/dL - 50   Trlycerides <478 mg/dL - 60   Hemoglobin G9F 4.8 - 5.6 % 6.3  -     Exercise Target Goals: Exercise Program  Goal: Individual exercise prescription set using results from initial 6 min walk test and THRR while considering  patient's activity barriers and safety.   Exercise Prescription Goal: Initial exercise prescription builds to 30-45 minutes a day of aerobic activity, 2-3 days per week.  Home exercise guidelines will be given to patient during program as part of exercise prescription that the participant will acknowledge.  Education: Aerobic Exercise: - Group verbal and visual presentation on the components of exercise prescription. Introduces F.I.T.T principle from ACSM for exercise prescriptions.  Reviews F.I.T.T. principles of aerobic exercise including progression. Written material given at graduation. Flowsheet Row Cardiac Rehab from 02/23/2023 in [facility] Cardiac and Pulmonary Rehab  Education need identified 01/18/23       Education: Resistance Exercise: - Group verbal and visual presentation on the components of exercise prescription. Introduces F.I.T.T principle from ACSM for exercise prescriptions  Reviews F.I.T.T. principles of resistance exercise including progression. Written material given at graduation.    Education: Exercise & Equipment Safety: - Individual verbal instruction and demonstration of equipment use and safety with use of the equipment. Flowsheet Row Cardiac Rehab from 02/23/2023 in [facility] Cardiac and Pulmonary Rehab  Date 01/18/23  Educator MB  Instruction Review Code 1- Verbalizes Understanding       Education: Exercise Physiology & General Exercise Guidelines: - Group verbal and written instruction with models to review the exercise physiology of the cardiovascular system and associated critical values. Provides general exercise guidelines with specific guidelines to those with heart or lung disease.  Flowsheet Row Cardiac Rehab from 02/23/2023 in [facility] Cardiac and Pulmonary Rehab  Education need identified 01/18/23       Education: Flexibility, Balance,  Mind/Body Relaxation: - Group verbal and visual presentation with interactive activity on the components of exercise prescription. Introduces F.I.T.T principle from ACSM for exercise prescriptions. Reviews F.I.T.T. principles of flexibility and balance exercise training including progression. Also discusses the mind body connection.  Reviews various relaxation techniques to help reduce and manage stress (i.e. Deep breathing, progressive muscle relaxation, and visualization). Balance handout provided to take home. Written material given at graduation.   Activity Barriers & Risk Stratification:  Activity Barriers & Cardiac Risk Stratification - 01/18/23 1102       Activity Barriers & Cardiac Risk Stratification   Activity Barriers Joint Problems;Other (comment);Arthritis;Right Knee Replacement    Comments nerve damage knee down R nerve damage to foot to toes. left foot, Right Shoulder tear    Cardiac Risk Stratification High             6 Minute Walk:  6 Minute Walk     Row Name 01/18/23 1059         6 Minute Walk   Phase Initial     Distance 600 feet     Walk Time 5.75 minutes     # of Rest Breaks 2     MPH 1.19     METS 1     RPE 13     Perceived Dyspnea  3     VO2 Peak 3.46     Symptoms No     Resting HR 80 bpm     Resting BP 130/64     Resting Oxygen Saturation  98 %     Exercise Oxygen Saturation  during 6 min walk 98 %     Max Ex. HR 91 bpm     Max Ex. BP 142/70     2 Minute Post BP 126/66              Oxygen Initial Assessment:   Oxygen Re-Evaluation:   Oxygen Discharge (Final Oxygen Re-Evaluation):   Initial Exercise Prescription:  Initial Exercise Prescription - 01/18/23 1100       Date of Initial Exercise RX and Referring Provider   Date 01/18/23    Referring Provider Tiajuana Amass, MD      Oxygen  Maintain Oxygen Saturation 88% or higher      Recumbant Bike   Level 1    RPM 50    Watts 15    Minutes 15    METs 1      NuStep    Level 1    SPM 80    Minutes 15    METs 1      Biostep-RELP   Level 1    SPM 50    Minutes 15    METs 1      Track   Laps 10    Minutes 15    METs 1.54      Prescription Details   Frequency (times per week) 2    Duration Progress to 30 minutes of continuous aerobic without signs/symptoms of physical distress      Intensity   THRR 40-80% of Max Heartrate 108-136    Ratings of Perceived Exertion 11-13    Perceived Dyspnea 0-4      Progression   Progression Continue to progress workloads to maintain intensity without signs/symptoms of physical distress.      Resistance Training   Training Prescription Yes    Weight 2 lb    Reps 10-15             Perform Capillary Blood Glucose checks as needed.  Exercise Prescription Changes:   Exercise Prescription Changes     Row Name 01/18/23 1100 02/07/23 1400 02/23/23 1500         Response to Exercise   Blood Pressure (Admit) 130/64 106/60 132/78     Blood Pressure (Exercise) 142/70 158/70 --     Blood Pressure (Exit) 126/66 110/60 112/60     Heart Rate (Admit) 90 bpm 58 bpm 57 bpm     Heart Rate (Exercise) 91 bpm 126 bpm 100 bpm     Heart Rate (Exit) 80 bpm 86 bpm 81 bpm     Oxygen Saturation (Admit) 98 % -- --     Oxygen Saturation (Exercise) 98 % -- --     Oxygen Saturation (Exit) 97 % -- --     Rating of Perceived Exertion (Exercise) 13 -- 16     Perceived Dyspnea (Exercise) 3 -- --     Symptoms none none none     Comments results First 3 days of full exercise --     Duration Progress to 30 minutes of  aerobic without signs/symptoms of physical distress Continue with 30 min of aerobic exercise without signs/symptoms of physical distress. Continue with 30 min of aerobic exercise without signs/symptoms of physical distress.     Intensity THRR New THRR unchanged THRR unchanged       Progression   Progression Continue to progress workloads to maintain intensity without signs/symptoms of physical distress.  Continue to progress workloads to maintain intensity without signs/symptoms of physical distress. Continue to progress workloads to maintain intensity without signs/symptoms of physical distress.     Average METs 1 2.43 2.69       Resistance Training   Training Prescription -- Yes Yes     Weight -- 2 lb 3 lb     Reps -- 10-15 10-15       Interval Training   Interval Training -- No No       Recumbant Bike   Level -- 3 1     Watts -- 15 16     Minutes -- 15 15     METs -- 2.42 2.46  NuStep   Level -- 2 2     Minutes -- 15 15     METs -- 3 3.2       Biostep-RELP   Level -- 1 1     Minutes -- 15 15     METs -- -- 3       Oxygen   Maintain Oxygen Saturation -- 88% or higher 88% or higher              Exercise Comments:   Exercise Comments     Row Name 01/12/23 1157 01/26/23 0820         Exercise Comments Both of her legs have nerve damage and it is hard for her to walk long distances, She cannot use the treadmill. Her doctor wants her to do the program. One being shoulder surgery, PT after surgery. She developed a new tear in her shoulder and cannot have any surgery. First full day of exercise!  Patient was oriented to gym and equipment including functions, settings, policies, and procedures.  Patient's individual exercise prescription and treatment plan were reviewed.  All starting workloads were established based on the results of the 6 minute walk test done at initial orientation visit.  The plan for exercise progression was also introduced and progression will be customized based on patient's performance and goals.               Exercise Goals and Review:   Exercise Goals     Row Name 01/18/23 1106             Exercise Goals   Increase Physical Activity Yes       Intervention Provide advice, education, support and counseling about physical activity/exercise needs.;Develop an individualized exercise prescription for aerobic and resistive training  based on initial evaluation findings, risk stratification, comorbidities and participant's personal goals.       Expected Outcomes Short Term: Attend rehab on a regular basis to increase amount of physical activity.;Long Term: Add in home exercise to make exercise part of routine and to increase amount of physical activity.;Long Term: Exercising regularly at least 3-5 days a week.       Increase Strength and Stamina Yes       Intervention Provide advice, education, support and counseling about physical activity/exercise needs.;Develop an individualized exercise prescription for aerobic and resistive training based on initial evaluation findings, risk stratification, comorbidities and participant's personal goals.       Expected Outcomes Short Term: Increase workloads from initial exercise prescription for resistance, speed, and METs.;Short Term: Perform resistance training exercises routinely during rehab and add in resistance training at home;Long Term: Improve cardiorespiratory fitness, muscular endurance and strength as measured by increased METs and functional capacity ( )       Able to understand and use rate of perceived exertion (RPE) scale Yes       Intervention Provide education and explanation on how to use RPE scale       Expected Outcomes Short Term: Able to use RPE daily in rehab to express subjective intensity level;Long Term:  Able to use RPE to guide intensity level when exercising independently       Able to understand and use Dyspnea scale Yes       Intervention Provide education and explanation on how to use Dyspnea scale       Expected Outcomes Short Term: Able to use Dyspnea scale daily in rehab to express subjective sense of shortness of breath during exertion;Long Term:  Able to use Dyspnea scale to guide intensity level when exercising independently       Knowledge and understanding of Target Heart Rate Range (THRR) Yes       Intervention Provide education and explanation of  THRR including how the numbers were predicted and where they are located for reference       Expected Outcomes Short Term: Able to state/look up THRR;Short Term: Able to use daily as guideline for intensity in rehab;Long Term: Able to use THRR to govern intensity when exercising independently       Able to check pulse independently Yes       Intervention Provide education and demonstration on how to check pulse in carotid and radial arteries.;Review the importance of being able to check your own pulse for safety during independent exercise       Expected Outcomes Short Term: Able to explain why pulse checking is important during independent exercise;Long Term: Able to check pulse independently and accurately       Understanding of Exercise Prescription Yes       Intervention Provide education, explanation, and written materials on patient's individual exercise prescription       Expected Outcomes Short Term: Able to explain program exercise prescription;Long Term: Able to explain home exercise prescription to exercise independently                Exercise Goals Re-Evaluation :  Exercise Goals Re-Evaluation     Row Name 01/26/23 0820 02/07/23 1447 02/23/23 1549 02/28/23 0739       Exercise Goal Re-Evaluation   Exercise Goals Review Able to understand and use rate of perceived exertion (RPE) scale;Able to understand and use Dyspnea scale;Knowledge and understanding of Target Heart Rate Range (THRR);Understanding of Exercise Prescription Increase Physical Activity;Increase Strength and Stamina;Understanding of Exercise Prescription Increase Physical Activity;Increase Strength and Stamina;Understanding of Exercise Prescription Increase Physical Activity;Knowledge and understanding of Target Heart Rate Range (THRR);Able to check pulse independently    Comments Reviewed RPE and dyspnea scale, THR and program prescription with pt today.  Pt voiced understanding and was given a copy of goals to take  home. Trecia is off to a good start in the program. She did well as she increased to level 3 on the recumbent bike and level 2 on the T4 nustep. She did not do any walking during her first 3 sessions of rehab. We will continue to monitor her progress in the program. Kiane is doing well in the program. She has continued to work at level 2 on the T4 nustep and level 1 on the biostep. She also decreased her recumbent bike workload back down to level 1 after previously working at level 3. She has not done any walking during the program at this time. She did increase from 2 lb to 3 lb hand weights. We will continue to monitor her progress in the program. Barbara continues to do well in the program, but is awaiting a home exercise prescription. We will continue to monitor her progress in the program and prescribe home exercise when appropriate.    Expected Outcomes Short: Use RPE daily to regulate intensity. Long: Follow program prescription in THR. Short: Continue to follow current exercise prescription. Long: Continue exercise to improve strength and stamina. Short: Increase back up to level 3 on the recumbent bike, begin walking the track. Long: Continue exercise to improve strength and stamina. Short: Meet with an EP to discuss home exercise plans. Long: Continue exercise and eventually start home  exercise regiment.             Discharge Exercise Prescription (Final Exercise Prescription Changes):  Exercise Prescription Changes - 02/23/23 1500       Response to Exercise   Blood Pressure (Admit) 132/78    Blood Pressure (Exit) 112/60    Heart Rate (Admit) 57 bpm    Heart Rate (Exercise) 100 bpm    Heart Rate (Exit) 81 bpm    Rating of Perceived Exertion (Exercise) 16    Symptoms none    Duration Continue with 30 min of aerobic exercise without signs/symptoms of physical distress.    Intensity THRR unchanged      Progression   Progression Continue to progress workloads to maintain intensity  without signs/symptoms of physical distress.    Average METs 2.69      Resistance Training   Training Prescription Yes    Weight 3 lb    Reps 10-15      Interval Training   Interval Training No      Recumbant Bike   Level 1    Watts 16    Minutes 15    METs 2.46      NuStep   Level 2    Minutes 15    METs 3.2      Biostep-RELP   Level 1    Minutes 15    METs 3      Oxygen   Maintain Oxygen Saturation 88% or higher             Nutrition:  Target Goals: Understanding of nutrition guidelines, daily intake of sodium 1500mg , cholesterol 200mg , calories 30% from fat and 7% or less from saturated fats, daily to have 5 or more servings of fruits and vegetables.  Education: All About Nutrition: -Group instruction provided by verbal, written material, interactive activities, discussions, models, and posters to present general guidelines for heart healthy nutrition including fat, fiber, MyPlate, the role of sodium in heart healthy nutrition, utilization of the nutrition label, and utilization of this knowledge for meal planning. Follow up email sent as well. Written material given at graduation. Flowsheet Row Cardiac Rehab from 02/23/2023 in [facility] Cardiac and Pulmonary Rehab  Education need identified 01/18/23  Date 01/26/23  Educator JG  Instruction Review Code 1- Verbalizes Understanding       Biometrics:  Pre Biometrics - 01/18/23 1106       Pre Biometrics   Height 5' 5.5" (1.664 m)    Weight 245 lb 3.2 oz (111.2 kg)    Waist Circumference 44.5 inches    Hip Circumference 55 inches    Waist to Hip Ratio 0.81 %    BMI (Calculated) 40.17    Single Leg Stand 0.6 seconds              Nutrition Therapy Plan and Nutrition Goals:  Nutrition Therapy & Goals - 01/18/23 1108       Nutrition Therapy   RD appointment deferred Yes      Personal Nutrition Goals   Nutrition Goal Patient deferred RD appointment      Intervention Plan   Intervention Prescribe,  educate and counsel regarding individualized specific dietary modifications aiming towards targeted core components such as weight, hypertension, lipid management, diabetes, heart failure and other comorbidities.;Nutrition handout(s) given to patient.    Expected Outcomes Short Term Goal: Understand basic principles of dietary content, such as calories, fat, sodium, cholesterol and nutrients.;Short Term Goal: A plan has been developed with personal nutrition goals  set during dietitian appointment.;Long Term Goal: Adherence to prescribed nutrition plan.             Nutrition Assessments:  MEDIFICTS Score Key: >=70 Need to make dietary changes  40-70 Heart Healthy Diet <= 40 Therapeutic Level Cholesterol Diet  Flowsheet Row Cardiac Rehab from 01/18/2023 in [facility] Cardiac and Pulmonary Rehab  Picture Your Plate Total Score on Admission 64      Picture Your Plate Scores: <59 Unhealthy dietary pattern with much room for improvement. 41-50 Dietary pattern unlikely to meet recommendations for good health and room for improvement. 51-60 More healthful dietary pattern, with some room for improvement.  >60 Healthy dietary pattern, although there may be some specific behaviors that could be improved.    Nutrition Goals Re-Evaluation:  Nutrition Goals Re-Evaluation     Row Name 02/28/23 0748             Goals   Nutrition Goal Patient deferred RD appointment                Nutrition Goals Discharge (Final Nutrition Goals Re-Evaluation):  Nutrition Goals Re-Evaluation - 02/28/23 0748       Goals   Nutrition Goal Patient deferred RD appointment             Psychosocial: Target Goals: Acknowledge presence or absence of significant depression and/or stress, maximize coping skills, provide positive support system. Participant is able to verbalize types and ability to use techniques and skills needed for reducing stress and depression.   Education: Stress, Anxiety, and  Depression - Group verbal and visual presentation to define topics covered.  Reviews how body is impacted by stress, anxiety, and depression.  Also discusses healthy ways to reduce stress and to treat/manage anxiety and depression.  Written material given at graduation. Flowsheet Row Cardiac Rehab from 02/23/2023 in [facility] Cardiac and Pulmonary Rehab  Education need identified 01/18/23       Education: Sleep Hygiene -Provides group verbal and written instruction about how sleep can affect your health.  Define sleep hygiene, discuss sleep cycles and impact of sleep habits. Review good sleep hygiene tips.    Initial Review & Psychosocial Screening:  Initial Psych Review & Screening - 01/12/23 1130       Initial Review   Current issues with None Identified      Family Dynamics   Good Support System? Yes   husband, grandson,  sister     Barriers   Psychosocial barriers to participate in program There are no identifiable barriers or psychosocial needs.      Screening Interventions   Interventions Encouraged to exercise;To provide support and resources with identified psychosocial needs;Provide feedback about the scores to participant    Expected Outcomes Short Term goal: Utilizing psychosocial counselor, staff and physician to assist with identification of specific Stressors or current issues interfering with healing process. Setting desired goal for each stressor or current issue identified.;Long Term Goal: Stressors or current issues are controlled or eliminated.;Short Term goal: Identification and review with participant of any Quality of Life or Depression concerns found by scoring the questionnaire.;Long Term goal: The participant improves quality of Life and PHQ9 Scores as seen by post scores and/or verbalization of changes             Quality of Life Scores:   Quality of Life - 01/18/23 1107       Quality of Life   Select Quality of Life      Quality of Life Scores  Health/Function Pre 22.5 %    Socioeconomic Pre 26.88 %    Psych/Spiritual Pre 28.14 %    Family Pre 27.1 %    GLOBAL Pre 25.29 %            Scores of 19 and below usually indicate a poorer quality of life in these areas.  A difference of  2-3 points is a clinically meaningful difference.  A difference of 2-3 points in the total score of the Quality of Life Index has been associated with significant improvement in overall quality of life, self-image, physical symptoms, and general health in studies assessing change in quality of life.  PHQ-9: Review Flowsheet       01/18/2023 08/18/2017  Depression screen PHQ 2/9  Decreased Interest 0 0  Down, Depressed, Hopeless 0 0  PHQ - 2 Score 0 0  Altered sleeping 3 -  Tired, decreased energy 3 -  Change in appetite 1 -  Feeling bad or failure about yourself  0 -  Trouble concentrating 0 -  Moving slowly or fidgety/restless 0 -  Suicidal thoughts 0 -  PHQ-9 Score 7 -  Difficult doing work/chores Somewhat difficult -   Interpretation of Total Score  Total Score Depression Severity:  1-4 = Minimal depression, 5-9 = Mild depression, 10-14 = Moderate depression, 15-19 = Moderately severe depression, 20-27 = Severe depression   Psychosocial Evaluation and Intervention:  Psychosocial Evaluation - 01/12/23 1149       Psychosocial Evaluation & Interventions   Interventions Encouraged to exercise with the program and follow exercise prescription    Comments There are no barriers to attending the program.  She does have exercise barriers.  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 Her doctor wants her to do the program.  She lives with her husband.  He helps her as needed and she has a daughter,grandson and her sister. for support. She has been dealing with multiple hospitalizations this year.   One being shoulder surgery, PT after surgery.  She developed a new tear in her  shoulder and cannot have any surgery. She is ready to attend the program    Expected Outcomes STG attends all scheduled sessions, is able to see exercise progression even with her limited use of legs and 1 arm. LTG  able to continue her exercise progression.    Continue Psychosocial Services  Follow up required by staff             Psychosocial Re-Evaluation:  Psychosocial Re-Evaluation     Row Name 02/28/23 [id number]             Psychosocial Re-Evaluation   Current issues with Current Sleep Concerns       Comments Avriella states that she has no stress or mental health concerns at the moment, but does report that she has difficulty falling asleep and staying asleep.       Expected Outcomes Short: Continue to attend [facility] regularly for regular exercise and social engagement. Long: Continue to improve symptoms and manage a positive mental state.       Interventions Encouraged to attend Cardiac Rehabilitation for the exercise       Continue Psychosocial Services  Follow up required by staff                Psychosocial Discharge (Final Psychosocial Re-Evaluation):  Psychosocial Re-Evaluation - 02/28/23 9604  Psychosocial Re-Evaluation   Current issues with Current Sleep Concerns    Comments Rhiana states that she has no stress or mental health concerns at the moment, but does report that she has difficulty falling asleep and staying asleep.    Expected Outcomes Short: Continue to attend [facility] regularly for regular exercise and social engagement. Long: Continue to improve symptoms and manage a positive mental state.    Interventions Encouraged to attend Cardiac Rehabilitation for the exercise    Continue Psychosocial Services  Follow up required by staff             Vocational Rehabilitation: Provide vocational rehab assistance to qualifying candidates.   Vocational Rehab Evaluation & Intervention:  Vocational Rehab - 01/12/23 1138        Initial Vocational Rehab Evaluation & Intervention   Assessment shows need for Vocational Rehabilitation No      Vocational Rehab Re-Evaulation   Comments retired      Discharge Vocational Rehab   Discharge Vocational Rehabilitation retired             Education: Education Goals: Education classes will be provided on a variety of topics geared toward better understanding of heart health and risk factor modification. Participant will state understanding/return demonstration of topics presented as noted by education test scores.  Learning Barriers/Preferences:   General Cardiac Education Topics:  AED/CPR: - Group verbal and written instruction with the use of models to demonstrate the basic use of the AED with the basic ABC's of resuscitation.   Anatomy and Cardiac Procedures: - Group verbal and visual presentation and models provide information about basic cardiac anatomy and function. Reviews the testing methods done to diagnose heart disease and the outcomes of the test results. Describes the treatment choices: Medical Management, Angioplasty, or Coronary Bypass Surgery for treating various heart conditions including Myocardial Infarction, Angina, Valve Disease, and Cardiac Arrhythmias.  Written material given at graduation. Flowsheet Row Cardiac Rehab from 02/23/2023 in [facility] Cardiac and Pulmonary Rehab  Education need identified 01/18/23  Date 02/02/23  Educator SB  Instruction Review Code 1- Verbalizes Understanding       Medication Safety: - Group verbal and visual instruction to review commonly prescribed medications for heart and lung disease. Reviews the medication, class of the drug, and side effects. Includes the steps to properly store meds and maintain the prescription regimen.  Written material given at graduation.   Intimacy: - Group verbal instruction through game format to discuss how heart and lung disease can affect sexual intimacy. Written material given  at graduation.. Flowsheet Row Cardiac Rehab from 02/23/2023 in [facility] Cardiac and Pulmonary Rehab  Education need identified 01/18/23       Know Your Numbers and Heart Failure: - Group verbal and visual instruction to discuss disease risk factors for cardiac and pulmonary disease and treatment options.  Reviews associated critical values for Overweight/Obesity, Hypertension, Cholesterol, and Diabetes.  Discusses basics of heart failure: signs/symptoms and treatments.  Introduces Heart Failure Zone chart for action plan for heart failure.  Written material given at graduation. Flowsheet Row Cardiac Rehab from 02/23/2023 in [facility] Cardiac and Pulmonary Rehab  Date 02/16/23  Educator SB  Instruction Review Code 1- Verbalizes Understanding       Infection Prevention: - Provides verbal and written material to individual with discussion of infection control including proper hand washing and proper equipment cleaning during exercise session. Flowsheet Row Cardiac Rehab from 02/23/2023 in [facility] Cardiac and Pulmonary Rehab  Date 01/18/23  Educator MB  Instruction Review Code 1- Verbalizes Understanding       Falls Prevention: - Provides verbal and written material to individual with discussion of falls prevention and safety. Flowsheet Row Cardiac Rehab from 02/23/2023 in [facility] Cardiac and Pulmonary Rehab  Date 01/18/23  Educator MB  Instruction Review Code 1- Verbalizes Understanding       Other: -Provides group and verbal instruction on various topics (see comments)   Knowledge Questionnaire Score:  Knowledge Questionnaire Score - 01/18/23 1110       Knowledge Questionnaire Score   Pre Score 18/26             Core Components/Risk Factors/Patient Goals at Admission:  Personal Goals and Risk Factors at Admission - 01/18/23 1110       Core Components/Risk Factors/Patient Goals on Admission    Weight Management Yes;Weight Loss    Intervention Weight Management: Develop a  combined nutrition and exercise program designed to reach desired caloric intake, while maintaining appropriate intake of nutrient and fiber, sodium and fats, and appropriate energy expenditure required for the weight goal.;Weight Management: Provide education and appropriate resources to help participant work on and attain dietary goals.;Weight Management/Obesity: Establish reasonable short term and long term weight goals.    Admit Weight 245 lb 3.2 oz (111.2 kg)    Goal Weight: Short Term 240 lb (108.9 kg)    Goal Weight: Long Term 180 lb (81.6 kg)    Expected Outcomes Short Term: Continue to assess and modify interventions until short term weight is achieved;Long Term: Adherence to nutrition and physical activity/exercise program aimed toward attainment of established weight goal;Weight Loss: Understanding of general recommendations for a balanced deficit meal plan, which promotes 1-2 lb weight loss per week and includes a negative energy balance of [phone] kcal/d;Understanding recommendations for meals to include 15-35% energy as protein, 25-35% energy from fat, 35-60% energy from carbohydrates, less than 200mg  of dietary cholesterol, 20-35 gm of total fiber daily;Understanding of distribution of calorie intake throughout the day with the consumption of 4-5 meals/snacks    Hypertension Yes    Intervention Provide education on lifestyle modifcations including regular physical activity/exercise, weight management, moderate sodium restriction and increased consumption of fresh fruit, vegetables, and low fat dairy, alcohol moderation, and smoking cessation.;Monitor prescription use compliance.    Expected Outcomes Short Term: Continued assessment and intervention until BP is < 140/62mm HG in hypertensive participants. < 130/74mm HG in hypertensive participants with diabetes, heart failure or chronic kidney disease.;Long Term: Maintenance of blood pressure at goal levels.    Lipids Yes    Intervention  Provide education and support for participant on nutrition & aerobic/resistive exercise along with prescribed medications to achieve LDL 70mg , HDL >40mg .    Expected Outcomes Short Term: Participant states understanding of desired cholesterol values and is compliant with medications prescribed. Participant is following exercise prescription and nutrition guidelines.;Long Term: Cholesterol controlled with medications as prescribed, with individualized exercise RX and with personalized nutrition plan. Value goals: LDL < 70mg , HDL > 40 mg.             Education:Diabetes - Individual verbal and written instruction to review signs/symptoms of diabetes, desired ranges of glucose level fasting, after meals and with exercise. Acknowledge that pre and post exercise glucose checks will be done for 3 sessions at entry of program.   Core Components/Risk Factors/Patient Goals Review:   Goals and Risk Factor Review     Row Name 02/28/23 [phone]  Core Components/Risk Factors/Patient Goals Review   Personal Goals Review Hypertension       Review June states that she only checks her blood pressure when she does not feel the best. We have encouraged her to take it at least once a day on non-program days. We will continue to check in with her about her hypertension.       Expected Outcomes Short: Start taking blood pressure everyday. Long: Continue to monitor hypertension                Core Components/Risk Factors/Patient Goals at Discharge (Final Review):   Goals and Risk Factor Review - 02/28/23 0748       Core Components/Risk Factors/Patient Goals Review   Personal Goals Review Hypertension    Review Mayelin states that she only checks her blood pressure when she does not feel the best. We have encouraged her to take it at least once a day on non-program days. We will continue to check in with her about her hypertension.    Expected Outcomes Short: Start taking blood pressure  everyday. Long: Continue to monitor hypertension             ITP Comments:  ITP Comments     Row Name 01/12/23 1156 01/18/23 1059 01/26/23 0819 02/01/23 0927 03/01/23 1144   ITP Comments Virtual orientation call completed today. shehas an appointment on Date: 01/18/2023  for EP eval and gym Orientation.  Documentation of diagnosis can be found in [facility] Date: 12/20/2022 . Completed and gym orientation. Initial ITP created and sent for review to Dr. Bethann Punches, Medical Director. First full day of exercise!  Patient was oriented to gym and equipment including functions, settings, policies, and procedures.  Patient's individual exercise prescription and treatment plan were reviewed.  All starting workloads were established based on the results of the 6 minute walk test done at initial orientation visit.  The plan for exercise progression was also introduced and progression will be customized based on patient's performance and goals. 30 Day review completed. Medical Director ITP review done, changes made as directed, and signed approval by Medical Director. new to program 30 Day review completed. Medical Director ITP review done, changes made as directed, and signed approval by Medical Director.            Comments:

## 2023-03-02 ENCOUNTER — Encounter: Payer: PPO | Admitting: *Deleted

## 2023-03-02 DIAGNOSIS — I213 ST elevation (STEMI) myocardial infarction of unspecified site: Secondary | ICD-10-CM

## 2023-03-02 DIAGNOSIS — Z955 Presence of coronary angioplasty implant and graft: Secondary | ICD-10-CM

## 2023-03-02 NOTE — Progress Notes (Signed)
Daily Session Note  Patient Details  Name: Jacqueline Rhodes MRN: [id number] Date of Birth: 1955-08-31 Referring Provider:   Flowsheet Row Cardiac Rehab from 01/18/2023 in [facility] Cardiac and Pulmonary Rehab  Referring Provider Tiajuana Amass, MD       Encounter Date: 03/02/2023  Check In:  Session Check In - 03/02/23 0742       Check-In   Supervising physician immediately available to respond to emergencies See telemetry face sheet for immediately available ER MD    Location [facility]-Cardiac & Pulmonary Rehab    Staff Present Ronette Deter, BS, Exercise Physiologist;Joseph Reino Kent, Guinevere Ferrari, RN, ADN;Maxon [employer], , Exercise Physiologist    Virtual Visit No    Medication changes reported     No    Fall or balance concerns reported    No    Warm-up and Cool-down Performed on first and last piece of equipment    Resistance Training Performed Yes    VAD Patient? No    PAD/SET Patient? No      Pain Assessment   Currently in Pain? No/denies                Social History   Tobacco Use  Smoking Status Former   Current packs/day: 0.00   Average packs/day: 0.5 packs/day for 30.0 years (15.0 ttl pk-yrs)   Types: Cigarettes   Start date: 85   Quit date: 2005   Years since quitting: 19.9  Smokeless Tobacco Never    Goals Met:  Independence with exercise equipment Exercise tolerated well No report of concerns or symptoms today Strength training completed today  Goals Unmet:  Not Applicable  Comments: Pt able to follow exercise prescription today without complaint.  Will continue to monitor for progression.    Dr. Bethann Punches is Medical Director for [facility] Cardiac Rehabilitation.  Dr. Vida Rigger is Medical Director for [facility] Pulmonary Rehabilitation.

## 2023-03-09 ENCOUNTER — Encounter: Payer: PPO | Admitting: *Deleted

## 2023-03-09 DIAGNOSIS — Z955 Presence of coronary angioplasty implant and graft: Secondary | ICD-10-CM

## 2023-03-09 DIAGNOSIS — I213 ST elevation (STEMI) myocardial infarction of unspecified site: Secondary | ICD-10-CM

## 2023-03-09 NOTE — Progress Notes (Signed)
Daily Session Note  Patient Details  Name: Jacqueline Rhodes MRN: [id number] Date of Birth: [birth year] Referring Provider:   Flowsheet Row Cardiac Rehab from 01/18/2023 in [facility] Cardiac and Pulmonary Rehab  Referring Provider Tiajuana Amass, MD       Encounter Date: 03/09/2023  Check In:  Session Check In - 03/09/23 0737       Check-In   Supervising physician immediately available to respond to emergencies See telemetry face sheet for immediately available ER MD    Location [facility]-Cardiac & Pulmonary Rehab    Staff Present Ronette Deter, BS, Exercise Physiologist;Susanne Bice, RN, BSN, CCRP;Joseph [place], Guinevere Ferrari, RN, [place]    Virtual Visit No    Medication changes reported     No    Fall or balance concerns reported    No    Warm-up and Cool-down Performed on first and last piece of equipment    Resistance Training Performed Yes    VAD Patient? No    PAD/SET Patient? No      Pain Assessment   Currently in Pain? No/denies                Social History   Tobacco Use  Smoking Status Former   Current packs/day: 0.00   Average packs/day: 0.5 packs/day for 30.0 years (15.0 ttl pk-yrs)   Types: Cigarettes   Start date: 5   Quit date: 2005   Years since quitting: 19.9  Smokeless Tobacco Never    Goals Met:  Independence with exercise equipment Exercise tolerated well No report of concerns or symptoms today Strength training completed today  Goals Unmet:  Not Applicable  Comments: Pt able to follow exercise prescription today without complaint.  Will continue to monitor for progression.    Dr. Bethann Punches is Medical Director for [facility] Cardiac Rehabilitation.  Dr. Vida Rigger is Medical Director for [facility] Pulmonary Rehabilitation.

## 2023-03-14 DIAGNOSIS — R42 Dizziness and giddiness: Secondary | ICD-10-CM | POA: Diagnosis not present

## 2023-03-14 DIAGNOSIS — R0609 Other forms of dyspnea: Secondary | ICD-10-CM | POA: Diagnosis not present

## 2023-03-14 DIAGNOSIS — R600 Localized edema: Secondary | ICD-10-CM | POA: Diagnosis not present

## 2023-03-14 DIAGNOSIS — H538 Other visual disturbances: Secondary | ICD-10-CM | POA: Diagnosis not present

## 2023-03-14 DIAGNOSIS — E876 Hypokalemia: Secondary | ICD-10-CM | POA: Diagnosis not present

## 2023-03-14 DIAGNOSIS — I5022 Chronic systolic (congestive) heart failure: Secondary | ICD-10-CM | POA: Diagnosis not present

## 2023-03-14 DIAGNOSIS — I10 Essential (primary) hypertension: Secondary | ICD-10-CM | POA: Diagnosis not present

## 2023-03-14 DIAGNOSIS — I25118 Atherosclerotic heart disease of native coronary artery with other forms of angina pectoris: Secondary | ICD-10-CM | POA: Diagnosis not present

## 2023-03-14 DIAGNOSIS — I252 Old myocardial infarction: Secondary | ICD-10-CM | POA: Diagnosis not present

## 2023-03-16 ENCOUNTER — Encounter: Payer: PPO | Attending: Cardiovascular Disease | Admitting: *Deleted

## 2023-03-16 DIAGNOSIS — Z955 Presence of coronary angioplasty implant and graft: Secondary | ICD-10-CM | POA: Insufficient documentation

## 2023-03-16 DIAGNOSIS — H538 Other visual disturbances: Secondary | ICD-10-CM | POA: Diagnosis not present

## 2023-03-16 DIAGNOSIS — Z48812 Encounter for surgical aftercare following surgery on the circulatory system: Secondary | ICD-10-CM | POA: Insufficient documentation

## 2023-03-16 DIAGNOSIS — I213 ST elevation (STEMI) myocardial infarction of unspecified site: Secondary | ICD-10-CM | POA: Diagnosis not present

## 2023-03-16 DIAGNOSIS — R42 Dizziness and giddiness: Secondary | ICD-10-CM | POA: Diagnosis not present

## 2023-03-16 DIAGNOSIS — I6522 Occlusion and stenosis of left carotid artery: Secondary | ICD-10-CM | POA: Diagnosis not present

## 2023-03-16 DIAGNOSIS — I25118 Atherosclerotic heart disease of native coronary artery with other forms of angina pectoris: Secondary | ICD-10-CM | POA: Diagnosis not present

## 2023-03-16 NOTE — Progress Notes (Signed)
 Daily Session Note  Patient Details  Name: Jacqueline Rhodes MRN: [id number] Date of Birth: 1955/04/03 Referring Provider:   Flowsheet Row Cardiac Rehab from 01/18/2023 in [facility] Cardiac and Pulmonary Rehab  Referring Provider Hilarie Rocher, MD       Encounter Date: 03/16/2023  Check In:  Session Check In - 03/16/23 0755       Check-In   Supervising physician immediately available to respond to emergencies See telemetry face sheet for immediately available ER MD    Location [facility]-Cardiac & Pulmonary Rehab    Staff Present Othel Durand, RN, BSN, CCRP;Maxon Conetta BS, , Exercise Physiologist;Wenceslao Loper Claudene, RN, ADN    Virtual Visit No    Medication changes reported     No    Fall or balance concerns reported    No    Warm-up and Cool-down Performed on first and last piece of equipment    Resistance Training Performed Yes    VAD Patient? No    PAD/SET Patient? No      Pain Assessment   Currently in Pain? No/denies                Social History   Tobacco Use  Smoking Status Former   Current packs/day: 0.00   Average packs/day: 0.5 packs/day for 30.0 years (15.0 ttl pk-yrs)   Types: Cigarettes   Start date: 67   Quit date: 2005   Years since quitting: 20.0  Smokeless Tobacco Never    Goals Met:  Independence with exercise equipment Exercise tolerated well No report of concerns or symptoms today Strength training completed today  Goals Unmet:  Not Applicable  Comments: Pt able to follow exercise prescription today without complaint.  Will continue to monitor for progression.    Dr. Oneil Pinal is Medical Director for [facility] Cardiac Rehabilitation.  Dr. Fuad Aleskerov is Medical Director for [facility] Pulmonary Rehabilitation.

## 2023-03-22 DIAGNOSIS — I5022 Chronic systolic (congestive) heart failure: Secondary | ICD-10-CM | POA: Diagnosis not present

## 2023-03-22 DIAGNOSIS — I25118 Atherosclerotic heart disease of native coronary artery with other forms of angina pectoris: Secondary | ICD-10-CM | POA: Diagnosis not present

## 2023-03-22 DIAGNOSIS — R0609 Other forms of dyspnea: Secondary | ICD-10-CM | POA: Diagnosis not present

## 2023-03-22 DIAGNOSIS — E876 Hypokalemia: Secondary | ICD-10-CM | POA: Diagnosis not present

## 2023-03-23 ENCOUNTER — Encounter: Payer: PPO | Admitting: *Deleted

## 2023-03-23 DIAGNOSIS — Z955 Presence of coronary angioplasty implant and graft: Secondary | ICD-10-CM

## 2023-03-23 DIAGNOSIS — I213 ST elevation (STEMI) myocardial infarction of unspecified site: Secondary | ICD-10-CM

## 2023-03-23 DIAGNOSIS — Z48812 Encounter for surgical aftercare following surgery on the circulatory system: Secondary | ICD-10-CM | POA: Diagnosis not present

## 2023-03-23 NOTE — Progress Notes (Signed)
 Daily Session Note  Patient Details  Name: Jacqueline Rhodes MRN: [id number] Date of Birth: 1956-01-10 Referring Provider:   Flowsheet Row Cardiac Rehab from 01/18/2023 in [facility] Cardiac and Pulmonary Rehab  Referring Provider Hilarie Rocher, MD       Encounter Date: 03/23/2023  Check In:  Session Check In - 03/23/23 0803       Check-In   Supervising physician immediately available to respond to emergencies See telemetry face sheet for immediately available ER MD    Location [facility]-Cardiac & Pulmonary Rehab    Staff Present Othel Durand, RN, BSN, CCRP;Joseph Hood, RCP,RRT,BSRT;Maxon [place] BS, , Exercise Physiologist;Zeina Akkerman Claudene, RN, [place]    Virtual Visit No    Medication changes reported     No    Fall or balance concerns reported    No    Warm-up and Cool-down Performed on first and last piece of equipment    Resistance Training Performed Yes    VAD Patient? No    PAD/SET Patient? No      Pain Assessment   Currently in Pain? No/denies                Social History   Tobacco Use  Smoking Status Former   Current packs/day: 0.00   Average packs/day: 0.5 packs/day for 30.0 years (15.0 ttl pk-yrs)   Types: Cigarettes   Start date: 83   Quit date: 2005   Years since quitting: 20.0  Smokeless Tobacco Never    Goals Met:  Independence with exercise equipment Exercise tolerated well No report of concerns or symptoms today Strength training completed today  Goals Unmet:  Not Applicable  Comments: Pt able to follow exercise prescription today without complaint.  Will continue to monitor for progression.    Dr. Oneil Pinal is Medical Director for [facility] Cardiac Rehabilitation.  Dr. Fuad Aleskerov is Medical Director for [facility] Pulmonary Rehabilitation.

## 2023-03-24 DIAGNOSIS — R221 Localized swelling, mass and lump, neck: Secondary | ICD-10-CM | POA: Diagnosis not present

## 2023-03-24 DIAGNOSIS — M26652 Arthropathy of left temporomandibular joint: Secondary | ICD-10-CM | POA: Diagnosis not present

## 2023-03-24 DIAGNOSIS — H9202 Otalgia, left ear: Secondary | ICD-10-CM | POA: Diagnosis not present

## 2023-03-24 DIAGNOSIS — H903 Sensorineural hearing loss, bilateral: Secondary | ICD-10-CM | POA: Diagnosis not present

## 2023-03-27 ENCOUNTER — Ambulatory Visit: Payer: PPO

## 2023-03-28 ENCOUNTER — Encounter: Payer: PPO | Admitting: *Deleted

## 2023-03-28 DIAGNOSIS — I213 ST elevation (STEMI) myocardial infarction of unspecified site: Secondary | ICD-10-CM

## 2023-03-28 DIAGNOSIS — Z48812 Encounter for surgical aftercare following surgery on the circulatory system: Secondary | ICD-10-CM | POA: Diagnosis not present

## 2023-03-28 DIAGNOSIS — Z955 Presence of coronary angioplasty implant and graft: Secondary | ICD-10-CM

## 2023-03-28 NOTE — Progress Notes (Signed)
 Daily Session Note  Patient Details  Name: Jacqueline Rhodes MRN: [id number] Date of Birth: 1956/03/11 Referring Provider:   Flowsheet Row Cardiac Rehab from 01/18/2023 in [facility] Cardiac and Pulmonary Rehab  Referring Provider Hilarie Rocher, MD       Encounter Date: 03/28/2023  Check In:  Session Check In - 03/28/23 0817       Check-In   Supervising physician immediately available to respond to emergencies See telemetry face sheet for immediately available ER MD    Location [facility]-Cardiac & Pulmonary Rehab    Staff Present Othel Durand, RN, BSN, CCRP;Krista Spencer RN, BSN;Maxon Conetta BS, , Exercise Physiologist;Margaret Best, MS, Exercise Physiologist    Virtual Visit No    Medication changes reported     No    Fall or balance concerns reported    No    Warm-up and Cool-down Performed on first and last piece of equipment    Resistance Training Performed Yes    VAD Patient? No    PAD/SET Patient? No      Pain Assessment   Currently in Pain? No/denies                Social History   Tobacco Use  Smoking Status Former   Current packs/day: 0.00   Average packs/day: 0.5 packs/day for 30.0 years (15.0 ttl pk-yrs)   Types: Cigarettes   Start date: 62   Quit date: 2005   Years since quitting: 20.0  Smokeless Tobacco Never    Goals Met:  Independence with exercise equipment Exercise tolerated well No report of concerns or symptoms today  Goals Unmet:  Not Applicable  Comments: Pt able to follow exercise prescription today without complaint.  Will continue to monitor for progression.    Dr. Oneil Pinal is Medical Director for [facility] Cardiac Rehabilitation.  Dr. Fuad Aleskerov is Medical Director for [facility] Pulmonary Rehabilitation.

## 2023-03-29 DIAGNOSIS — Z955 Presence of coronary angioplasty implant and graft: Secondary | ICD-10-CM

## 2023-03-29 DIAGNOSIS — R42 Dizziness and giddiness: Secondary | ICD-10-CM | POA: Diagnosis not present

## 2023-03-29 DIAGNOSIS — I213 ST elevation (STEMI) myocardial infarction of unspecified site: Secondary | ICD-10-CM

## 2023-03-29 NOTE — Progress Notes (Signed)
 Cardiac Individual Treatment Plan  Patient Details  Name: Jacqueline Rhodes MRN: [id number] Date of Birth: 09-29-1955 Referring Provider:   Flowsheet Row Cardiac Rehab from 01/18/2023 in [facility] Cardiac and Pulmonary Rehab  Referring Provider Dorita Garter, MD       Initial Encounter Date:  Flowsheet Row Cardiac Rehab from 01/18/2023 in [facility] Cardiac and Pulmonary Rehab  Date 01/18/23       Visit Diagnosis: ST elevation myocardial infarction (STEMI), unspecified artery [facility])  Status post coronary artery stent placement  Patient's Home Medications on Admission:  Current Outpatient Medications:    acetaminophen  (TYLENOL ) 500 MG tablet, Take 2 tablets (1,000 mg total) by mouth every 8 (eight) hours. (Patient taking differently: Take 1,000 mg by mouth every 8 (eight) hours as needed for mild pain (pain score 1-3), moderate pain (pain score 4-6), fever or headache.), Disp: 90 tablet, Rfl: 2   albuterol  (PROVENTIL  HFA;VENTOLIN  HFA) 108 (90 Base) MCG/ACT inhaler, Inhale 2 puffs into the lungs every 6 (six) hours as needed for wheezing or shortness of breath. , Disp: , Rfl:    aspirin  81 MG chewable tablet, Chew 1 tablet (81 mg total) by mouth daily., Disp: 30 tablet, Rfl: 0   Calcium  Fructoborate POWD, 20 mcg by Does not apply route. Cream, Disp: , Rfl:    Cetirizine HCl 10 MG CAPS, Take 10 mg by mouth daily. , Disp: , Rfl:    Cholecalciferol 25 MCG (1000 UT) TBDP, Take 125 mcg by mouth., Disp: , Rfl:    Coenzyme Q10 (COQ-10) 100 MG CAPS, Take 100 mg by mouth daily. , Disp: , Rfl:    ezetimibe  (ZETIA ) 10 MG tablet, Take 1 tablet (10 mg total) by mouth daily., Disp: 30 tablet, Rfl: 0   fluticasone  furoate-vilanterol (BREO ELLIPTA ) 100-25 MCG/INH AEPB, Inhale 1 puff into the lungs daily. , Disp: , Rfl:    folic acid  (FOLVITE ) 1 MG tablet, Take 1 mg by mouth daily., Disp: , Rfl:    furosemide  (LASIX ) 20 MG tablet, Take 20 mg by mouth daily as needed for fluid or edema., Disp: , Rfl:     isosorbide  mononitrate (IMDUR ) 60 MG 24 hr tablet, Take 2 tablets (120 mg total) by mouth daily., Disp: 60 tablet, Rfl: 0   levothyroxine  (SYNTHROID ) 150 MCG tablet, Take 150 mcg by mouth daily before breakfast., Disp: , Rfl:    meclizine (ANTIVERT) 25 MG tablet, Take 25 mg by mouth 3 (three) times daily as needed for dizziness., Disp: , Rfl:    methotrexate (RHEUMATREX) 2.5 MG tablet, Take 20 mg by mouth every Thursday. , Disp: , Rfl:    metoprolol  succinate (TOPROL -XL) 25 MG 24 hr tablet, Take 1 tablet (25 mg total) by mouth 2 (two) times daily., Disp: 60 tablet, Rfl: 0   nitroGLYCERIN  (NITROSTAT ) 0.4 MG SL tablet, Place 1 tablet (0.4 mg total) under the tongue once every 5 (five) minutes as needed for chest pain. (Max of 3 doses in 15 minutes)., Disp: 25 tablet, Rfl: 0   pantoprazole  (PROTONIX ) 40 MG tablet, Take 40 mg by mouth 2 (two) times daily., Disp: , Rfl:    pravastatin  (PRAVACHOL ) 20 MG tablet, Take 1 tablet (20 mg total) by mouth daily., Disp: 30 tablet, Rfl: 0   Secukinumab  (COSENTYX  SENSOREADY 300 DOSE) 150 MG/ML SOAJ, Inject 300 mg into the skin every 28 (twenty-eight) days.  (Patient not taking: Reported on 01/12/2023), Disp: , Rfl:    spironolactone  (ALDACTONE ) 25 MG tablet, Take 0.5 tablet (12.5 mg total) by mouth  daily., Disp: 15 tablet, Rfl: 0   ticagrelor  (BRILINTA ) 90 MG TABS tablet, Take 1 tablet (90 mg total) by mouth 2 (two) times daily., Disp: 60 tablet, Rfl: 0   UNABLE TO FIND, Take 1.5 drops by mouth daily at 2 PM. Med Name: B12  5,000 unit, B6 3400 unit B1  and Niacin, Disp: , Rfl:   Past Medical History: Past Medical History:  Diagnosis Date   Anemia    Anginal pain (HCC)    Aortic atherosclerosis (HCC)    Asthma    B12 deficiency    Carpal tunnel syndrome    CHF (congestive heart failure) (HCC)    Chronic venous insufficiency    Complete tear of right rotator cuff    COPD (chronic obstructive pulmonary disease) (HCC)    Coronary artery disease 01/07/2005    a.) LHC 01/07/2005 (NSTEMI): LHC 60% pLAD, 70% D1, 30% pLCx, 20% pRCA, 25% dRCA - med mgmt; b.) LHC ([place] ) 01/10/2008: 25/60% pLAD, 70% D1, 20% pLCx, 20% mRCA --> FFR normal --> med mgmt; c.) LHC ([place] ) 01/04/2012:: 40/60% pLAD, 70% D1, 25% pLCx, 20% pRCA, 20% mRCA, 25% dRCA --> FFR normal --> med mgmt; d.) LHC ([place] ) 01/10/2018: 20% pRCA, 50% pLAD, 55% oD1 -- med mgmt   Diverticulosis    DOE (dyspnea on exertion)    GERD (gastroesophageal reflux disease)    History of bilateral cataract extraction 2014   Hyperlipidemia    Hypertension    Hypothyroidism    a.) s/p LEFT hemithyroidectomy; on levothyroxine    Long term current use of aspirin     Long term current use of immunosuppressive drug    a.) MTX + secukinumab  for RA/psoriatric arthritis diagnoses   Lower extremity edema    Lymphedema    NSTEMI (non-ST elevated myocardial infarction) (HCC) 01/06/2005   a.) LHC 01/07/2005: 60% pLAD, 70% D1, 30% pLCx, 20% pRCA, 25% dRCA -- med mgmt   OSA on CPAP    a.) compliance issues with prescribed nocturnal PAP therapy   Osteoarthritis    Peripheral vascular disease (HCC)    Post-menopausal osteoporosis    Psoriatic arthritis (HCC)    RA (rheumatoid arthritis) (HCC)    Renal insufficiency    T2DM (type 2 diabetes mellitus) (HCC)    Thyroid  cancer (HCC)    a.) s/p LEFT hemithyroidectomy 08/2015    Tobacco Use: Social History   Tobacco Use  Smoking Status Former   Current packs/day: 0.00   Average packs/day: 0.5 packs/day for 30.0 years (15.0 ttl pk-yrs)   Types: Cigarettes   Start date: 1   Quit date: 2005   Years since quitting: 20.0  Smokeless Tobacco Never    Labs: Review Flowsheet       Latest Ref Rng & Units 11/11/2022 12/21/2022  Labs for ITP Cardiac and Pulmonary Rehab  Cholestrol 0 - 200 mg/dL - 782   LDL (calc) 0 - 99 mg/dL - 956   HDL-C >21 mg/dL - 50   Trlycerides <308 mg/dL - 60   Hemoglobin M5H 4.8 - 5.6 % 6.3  -     Exercise Target Goals: Exercise Program  Goal: Individual exercise prescription set using results from initial 6 min walk test and THRR while considering  patient's activity barriers and safety.   Exercise Prescription Goal: Initial exercise prescription builds to 30-45 minutes a day of aerobic activity, 2-3 days per week.  Home exercise guidelines will be given to patient during program as part of exercise prescription that the participant will acknowledge.  Education: Aerobic Exercise: - Group verbal and visual presentation on the components of exercise prescription. Introduces F.I.T.T principle from ACSM for exercise prescriptions.  Reviews F.I.T.T. principles of aerobic exercise including progression. Written material given at graduation. Flowsheet Row Cardiac Rehab from 03/23/2023 in [facility] Cardiac and Pulmonary Rehab  Education need identified 01/18/23  Date 03/23/23  Educator MC  Instruction Review Code 1- [employer] Understanding       Education: Resistance Exercise: - Group verbal and visual presentation on the components of exercise prescription. Introduces F.I.T.T principle from ACSM for exercise prescriptions  Reviews F.I.T.T. principles of resistance exercise including progression. Written material given at graduation. Flowsheet Row Cardiac Rehab from 03/23/2023 in [facility] Cardiac and Pulmonary Rehab  Date 03/16/23  Educator MB  Instruction Review Code 1- [employer] Understanding        Education: Exercise & Equipment Safety: - Individual verbal instruction and demonstration of equipment use and safety with use of the equipment. Flowsheet Row Cardiac Rehab from 03/23/2023 in [facility] Cardiac and Pulmonary Rehab  Date 01/18/23  Educator MB  Instruction Review Code 1- Verbalizes Understanding       Education: Exercise Physiology & General Exercise Guidelines: - Group verbal and written instruction with models to review the exercise physiology of the cardiovascular system and associated critical values. Provides general  exercise guidelines with specific guidelines to those with heart or lung disease.  Flowsheet Row Cardiac Rehab from 03/23/2023 in [facility] Cardiac and Pulmonary Rehab  Education need identified 01/18/23  Date 03/09/23  Educator MC  Instruction Review Code 1- [employer] Understanding       Education: Flexibility, Balance, Mind/Body Relaxation: - Group verbal and visual presentation with interactive activity on the components of exercise prescription. Introduces F.I.T.T principle from ACSM for exercise prescriptions. Reviews F.I.T.T. principles of flexibility and balance exercise training including progression. Also discusses the mind body connection.  Reviews various relaxation techniques to help reduce and manage stress (i.e. Deep breathing, progressive muscle relaxation, and visualization). Balance handout provided to take home. Written material given at graduation. Flowsheet Row Cardiac Rehab from 03/23/2023 in [facility] Cardiac and Pulmonary Rehab  Date 03/16/23  Educator MB  Instruction Review Code 1- Verbalizes Understanding       Activity Barriers & Risk Stratification:  Activity Barriers & Cardiac Risk Stratification - 01/18/23 1102       Activity Barriers & Cardiac Risk Stratification   Activity Barriers Joint Problems;Other (comment);Arthritis;Right Knee Replacement    Comments nerve damage knee down R nerve damage to foot to toes. left foot, Right Shoulder tear    Cardiac Risk Stratification High             6 Minute Walk:  6 Minute Walk     Row Name 01/18/23 1059         6 Minute Walk   Phase Initial     Distance 600 feet     Walk Time 5.75 minutes     # of Rest Breaks 2     MPH 1.19     METS 1     RPE 13     Perceived Dyspnea  3     VO2 Peak 3.46     Symptoms No     Resting HR 80 bpm     Resting BP 130/64     Resting Oxygen Saturation  98 %     Exercise Oxygen Saturation  during 6 min walk 98 %     Max Ex. HR 91 bpm  Max Ex. BP 142/70     2 Minute Post BP  126/66              Oxygen Initial Assessment:   Oxygen Re-Evaluation:   Oxygen Discharge (Final Oxygen Re-Evaluation):   Initial Exercise Prescription:  Initial Exercise Prescription - 01/18/23 1100       Date of Initial Exercise RX and Referring Provider   Date 01/18/23    Referring Provider Dorita Garter, MD      Oxygen   Maintain Oxygen Saturation 88% or higher      Recumbant Bike   Level 1    RPM 50    Watts 15    Minutes 15    METs 1      NuStep   Level 1    SPM 80    Minutes 15    METs 1      Biostep-RELP   Level 1    SPM 50    Minutes 15    METs 1      Track   Laps 10    Minutes 15    METs 1.54      Prescription Details   Frequency (times per week) 2    Duration Progress to 30 minutes of continuous aerobic without signs/symptoms of physical distress      Intensity   THRR 40-80% of Max Heartrate 108-136    Ratings of Perceived Exertion 11-13    Perceived Dyspnea 0-4      Progression   Progression Continue to progress workloads to maintain intensity without signs/symptoms of physical distress.      Resistance Training   Training Prescription Yes    Weight 2 lb    Reps 10-15             Perform Capillary Blood Glucose checks as needed.  Exercise Prescription Changes:   Exercise Prescription Changes     Row Name 01/18/23 1100 02/07/23 1400 02/23/23 1500 03/02/23 0700 03/09/23 0900     Response to Exercise   Blood Pressure (Admit) 130/64 106/60 132/78 -- 132/60   Blood Pressure (Exercise) 142/70 158/70 -- -- 164/64   Blood Pressure (Exit) 126/66 110/60 112/60 -- 122/66   Heart Rate (Admit) 90 bpm 58 bpm 57 bpm -- 60 bpm   Heart Rate (Exercise) 91 bpm 126 bpm 100 bpm -- 108 bpm   Heart Rate (Exit) 80 bpm 86 bpm 81 bpm -- 66 bpm   Oxygen Saturation (Admit) 98 % -- -- -- --   Oxygen Saturation (Exercise) 98 % -- -- -- --   Oxygen Saturation (Exit) 97 % -- -- -- --   Rating of Perceived Exertion (Exercise) 13 -- 16 -- 16    Perceived Dyspnea (Exercise) 3 -- -- -- --   Symptoms none none none -- none   Comments results First 3 days of full exercise -- -- --   Duration Progress to 30 minutes of  aerobic without signs/symptoms of physical distress Continue with 30 min of aerobic exercise without signs/symptoms of physical distress. Continue with 30 min of aerobic exercise without signs/symptoms of physical distress. -- Continue with 30 min of aerobic exercise without signs/symptoms of physical distress.   Intensity THRR New THRR unchanged THRR unchanged -- THRR unchanged     Progression   Progression Continue to progress workloads to maintain intensity without signs/symptoms of physical distress. Continue to progress workloads to maintain intensity without signs/symptoms of physical distress. Continue to progress workloads to maintain intensity  without signs/symptoms of physical distress. -- Continue to progress workloads to maintain intensity without signs/symptoms of physical distress.   Average METs 1 2.43 2.69 -- 3.07     Resistance Training   Training Prescription -- Yes Yes -- Yes   Weight -- 2 lb 3 lb -- 3 lb   Reps -- 10-15 10-15 -- 10-15     Interval Training   Interval Training -- No No -- No     Recumbant Bike   Level -- 3 1 -- 1   Watts -- 15 16 -- 15   Minutes -- 15 15 -- 15   METs -- 2.42 2.46 -- 2.43     NuStep   Level -- 2 2 -- 2   Minutes -- 15 15 -- 15   METs -- 3 3.2 -- 4.2     Biostep-RELP   Level -- 1 1 -- 1   Minutes -- 15 15 -- 15   METs -- -- 3 -- 3     Home Exercise Plan   Plans to continue exercise at -- -- -- [employer] (comment)  Plans to walk the perimeter of stores or the [employer] (comment)  Plans to walk the perimeter of stores or the mall   Frequency -- -- -- Add 3 additional days to program exercise sessions. Add 3 additional days to program exercise sessions.   Initial Home Exercises Provided -- -- -- 03/02/23 03/02/23     Oxygen    Maintain Oxygen Saturation -- 88% or higher 88% or higher 88% or higher 88% or higher    Row Name 03/23/23 1600             Response to Exercise   Blood Pressure (Admit) 144/80       Blood Pressure (Exit) 118/62       Heart Rate (Admit) 84 bpm       Heart Rate (Exercise) 112 bpm       Heart Rate (Exit) 68 bpm       Rating of Perceived Exertion (Exercise) 13       Symptoms knee/leg pain       Duration Continue with 30 min of aerobic exercise without signs/symptoms of physical distress.       Intensity THRR unchanged         Progression   Progression Continue to progress workloads to maintain intensity without signs/symptoms of physical distress.       Average METs 3.83         Resistance Training   Training Prescription Yes       Weight 3 lb       Reps 10-15         Interval Training   Interval Training No         NuStep   Level 3       Minutes 15       METs 4.6         Biostep-RELP   Level 1       Minutes 15       METs 3         Home Exercise Plan   Plans to continue exercise at [facility] (comment)  Plans to walk the perimeter of stores or the mall       Frequency Add 3 additional days to program exercise sessions.       Initial Home Exercises Provided 03/02/23         Oxygen  Maintain Oxygen Saturation 88% or higher                Exercise Comments:   Exercise Comments     Row Name 01/12/23 1157 01/26/23 0820         Exercise Comments Both of her legs have nerve damage and it is hard for her to walk long distances, She cannot use the treadmill. Her doctor wants her to do the program. One being shoulder surgery, PT after surgery. She developed a new tear in her shoulder and cannot have any surgery. First full day of exercise!  Patient was oriented to gym and equipment including functions, settings, policies, and procedures.  Patient's individual exercise prescription and treatment plan were reviewed.  All starting workloads were established  based on the results of the 6 minute walk test done at initial orientation visit.  The plan for exercise progression was also introduced and progression will be customized based on patient's performance and goals.               Exercise Goals and Review:   Exercise Goals     Row Name 01/18/23 1106             Exercise Goals   Increase Physical Activity Yes       Intervention Provide advice, education, support and counseling about physical activity/exercise needs.;Develop an individualized exercise prescription for aerobic and resistive training based on initial evaluation findings, risk stratification, comorbidities and participant's personal goals.       Expected Outcomes Short Term: Attend rehab on a regular basis to increase amount of physical activity.;Long Term: Add in home exercise to make exercise part of routine and to increase amount of physical activity.;Long Term: Exercising regularly at least 3-5 days a week.       Increase Strength and Stamina Yes       Intervention Provide advice, education, support and counseling about physical activity/exercise needs.;Develop an individualized exercise prescription for aerobic and resistive training based on initial evaluation findings, risk stratification, comorbidities and participant's personal goals.       Expected Outcomes Short Term: Increase workloads from initial exercise prescription for resistance, speed, and METs.;Short Term: Perform resistance training exercises routinely during rehab and add in resistance training at home;Long Term: Improve cardiorespiratory fitness, muscular endurance and strength as measured by increased METs and functional capacity ( )       Able to understand and use rate of perceived exertion (RPE) scale Yes       Intervention Provide education and explanation on how to use RPE scale       Expected Outcomes Short Term: Able to use RPE daily in rehab to express subjective intensity level;Long Term:  Able  to use RPE to guide intensity level when exercising independently       Able to understand and use Dyspnea scale Yes       Intervention Provide education and explanation on how to use Dyspnea scale       Expected Outcomes Short Term: Able to use Dyspnea scale daily in rehab to express subjective sense of shortness of breath during exertion;Long Term: Able to use Dyspnea scale to guide intensity level when exercising independently       Knowledge and understanding of Target Heart Rate Range (THRR) Yes       Intervention Provide education and explanation of THRR including how the numbers were predicted and where they are located for reference       Expected Outcomes  Short Term: Able to state/look up THRR;Short Term: Able to use daily as guideline for intensity in rehab;Long Term: Able to use THRR to govern intensity when exercising independently       Able to check pulse independently Yes       Intervention Provide education and demonstration on how to check pulse in carotid and radial arteries.;Review the importance of being able to check your own pulse for safety during independent exercise       Expected Outcomes Short Term: Able to explain why pulse checking is important during independent exercise;Long Term: Able to check pulse independently and accurately       Understanding of Exercise Prescription Yes       Intervention Provide education, explanation, and written materials on patient's individual exercise prescription       Expected Outcomes Short Term: Able to explain program exercise prescription;Long Term: Able to explain home exercise prescription to exercise independently                Exercise Goals Re-Evaluation :  Exercise Goals Re-Evaluation     Row Name 01/26/23 0820 02/07/23 1447 02/23/23 1549 02/28/23 0739 03/02/23 0756     Exercise Goal Re-Evaluation   Exercise Goals Review Able to understand and use rate of perceived exertion (RPE) scale;Able to understand and use  Dyspnea scale;Knowledge and understanding of Target Heart Rate Range (THRR);Understanding of Exercise Prescription Increase Physical Activity;Increase Strength and Stamina;Understanding of Exercise Prescription Increase Physical Activity;Increase Strength and Stamina;Understanding of Exercise Prescription Increase Physical Activity;Knowledge and understanding of Target Heart Rate Range (THRR);Able to check pulse independently Increase Physical Activity;Knowledge and understanding of Target Heart Rate Range (THRR);Able to check pulse independently;Able to understand and use Dyspnea scale;Increase Strength and Stamina;Able to understand and use rate of perceived exertion (RPE) scale;Understanding of Exercise Prescription   Comments Reviewed RPE and dyspnea scale, THR and program prescription with pt today.  Pt voiced understanding and was given a copy of goals to take home. Jacqueline Rhodes is off to a good start in the program. She did well as she increased to level 3 on the recumbent bike and level 2 on the T4 nustep. She did not do any walking during her first 3 sessions of rehab. We will continue to monitor her progress in the program. Jacqueline Rhodes is doing well in the program. She has continued to work at level 2 on the T4 nustep and level 1 on the biostep. She also decreased her recumbent bike workload back down to level 1 after previously working at level 3. She has not done any walking during the program at this time. She did increase from 2 lb to 3 lb hand weights. We will continue to monitor her progress in the program. Jacqueline Rhodes continues to do well in the program, but is awaiting a home exercise prescription. We will continue to monitor her progress in the program and prescribe home exercise when appropriate. Reviewed home exercise with pt today.  Pt plans to walk the perimeter of stores and the mall for exercise.  Reviewed THR, pulse, RPE, sign and symptoms, pulse oximetery and when to call 911 or MD.  Also discussed  weather considerations and indoor options.  Pt voiced understanding.   Expected Outcomes Short: Use RPE daily to regulate intensity. Long: Follow program prescription in THR. Short: Continue to follow current exercise prescription. Long: Continue exercise to improve strength and stamina. Short: Increase back up to level 3 on the recumbent bike, begin walking the track. Long: Continue exercise  to improve strength and stamina. Short: Meet with an EP to discuss home exercise plans. Long: Continue exercise and eventually start home exercise regiment. Short: Begin walking on days away from rehab. Long: Continue exercise to improve strength and stamina.    Row Name 03/09/23 0945 03/23/23 1623           Exercise Goal Re-Evaluation   Exercise Goals Review Increase Physical Activity;Increase Strength and Stamina;Understanding of Exercise Prescription Increase Physical Activity;Increase Strength and Stamina;Understanding of Exercise Prescription      Comments Jacqueline Rhodes is doing great in rehab. She hasnt been able to increase her levels, but has been able to maintain her workloads on the T4 nustep, biostep and recumbent bike. We will continue to encourage and monitor her progression in the program. Jacqueline Rhodes continues to do well in rehab. She recently increased from level 2 to level 3 on the T4 nustep. She also has done well on the biostep and continues to work at level 1. We will continue to monitor her progress in the program.      Expected Outcomes Short: Increase to level 2 on the recumbent bike and biostep. Long: Continue exercise to improve strength and stamina. Short: Increase to level 2 on the biostep. Long: Continue exercise to improve strength and stamina.               Discharge Exercise Prescription (Final Exercise Prescription Changes):  Exercise Prescription Changes - 03/23/23 1600       Response to Exercise   Blood Pressure (Admit) 144/80    Blood Pressure (Exit) 118/62    Heart Rate (Admit)  84 bpm    Heart Rate (Exercise) 112 bpm    Heart Rate (Exit) 68 bpm    Rating of Perceived Exertion (Exercise) 13    Symptoms knee/leg pain    Duration Continue with 30 min of aerobic exercise without signs/symptoms of physical distress.    Intensity THRR unchanged      Progression   Progression Continue to progress workloads to maintain intensity without signs/symptoms of physical distress.    Average METs 3.83      Resistance Training   Training Prescription Yes    Weight 3 lb    Reps 10-15      Interval Training   Interval Training No      NuStep   Level 3    Minutes 15    METs 4.6      Biostep-RELP   Level 1    Minutes 15    METs 3      Home Exercise Plan   Plans to continue exercise at [facility] (comment)   Plans to walk the perimeter of stores or the mall   Frequency Add 3 additional days to program exercise sessions.    Initial Home Exercises Provided 03/02/23      Oxygen   Maintain Oxygen Saturation 88% or higher             Nutrition:  Target Goals: Understanding of nutrition guidelines, daily intake of sodium 1500mg , cholesterol 200mg , calories 30% from fat and 7% or less from saturated fats, daily to have 5 or more servings of fruits and vegetables.  Education: All About Nutrition: -Group instruction provided by verbal, written material, interactive activities, discussions, models, and posters to present general guidelines for heart healthy nutrition including fat, fiber, MyPlate, the role of sodium in heart healthy nutrition, utilization of the nutrition label, and utilization of this knowledge for meal planning. Follow up email  sent as well. Written material given at graduation. Flowsheet Row Cardiac Rehab from 03/23/2023 in [facility] Cardiac and Pulmonary Rehab  Education need identified 01/18/23  Date 01/26/23  Educator JG  Instruction Review Code 1- Verbalizes Understanding       Biometrics:  Pre Biometrics - 01/18/23 1106       Pre  Biometrics   Height 5' 5.5" (1.664 m)    Weight 245 lb 3.2 oz (111.2 kg)    Waist Circumference 44.5 inches    Hip Circumference 55 inches    Waist to Hip Ratio 0.81 %    BMI (Calculated) 40.17    Single Leg Stand 0.6 seconds              Nutrition Therapy Plan and Nutrition Goals:  Nutrition Therapy & Goals - 01/18/23 1108       Nutrition Therapy   RD appointment deferred Yes      Personal Nutrition Goals   Nutrition Goal Patient deferred RD appointment      Intervention Plan   Intervention Prescribe, educate and counsel regarding individualized specific dietary modifications aiming towards targeted core components such as weight, hypertension, lipid management, diabetes, heart failure and other comorbidities.;Nutrition handout(s) given to patient.    Expected Outcomes Short Term Goal: Understand basic principles of dietary content, such as calories, fat, sodium, cholesterol and nutrients.;Short Term Goal: A plan has been developed with personal nutrition goals set during dietitian appointment.;Long Term Goal: Adherence to prescribed nutrition plan.             Nutrition Assessments:  MEDIFICTS Score Key: >=70 Need to make dietary changes  40-70 Heart Healthy Diet <= 40 Therapeutic Level Cholesterol Diet  Flowsheet Row Cardiac Rehab from 01/18/2023 in [facility] Cardiac and Pulmonary Rehab  Picture Your Plate Total Score on Admission 64      Picture Your Plate Scores: <40 Unhealthy dietary pattern with much room for improvement. 41-50 Dietary pattern unlikely to meet recommendations for good health and room for improvement. 51-60 More healthful dietary pattern, with some room for improvement.  >60 Healthy dietary pattern, although there may be some specific behaviors that could be improved.    Nutrition Goals Re-Evaluation:  Nutrition Goals Re-Evaluation     Row Name 02/28/23 0748 03/09/23 0739           Goals   Nutrition Goal Patient deferred RD appointment  Patient deferred RD appointment               Nutrition Goals Discharge (Final Nutrition Goals Re-Evaluation):  Nutrition Goals Re-Evaluation - 03/09/23 0739       Goals   Nutrition Goal Patient deferred RD appointment             Psychosocial: Target Goals: Acknowledge presence or absence of significant depression and/or stress, maximize coping skills, provide positive support system. Participant is able to verbalize types and ability to use techniques and skills needed for reducing stress and depression.   Education: Stress, Anxiety, and Depression - Group verbal and visual presentation to define topics covered.  Reviews how body is impacted by stress, anxiety, and depression.  Also discusses healthy ways to reduce stress and to treat/manage anxiety and depression.  Written material given at graduation. Flowsheet Row Cardiac Rehab from 03/23/2023 in [facility] Cardiac and Pulmonary Rehab  Education need identified 01/18/23  Date 03/02/23  Educator SB  Instruction Review Code 1- [employer] Understanding       Education: Sleep Hygiene -Provides group verbal and written instruction  about how sleep can affect your health.  Define sleep hygiene, discuss sleep cycles and impact of sleep habits. Review good sleep hygiene tips.    Initial Review & Psychosocial Screening:  Initial Psych Review & Screening - 01/12/23 1130       Initial Review   Current issues with None Identified      Family Dynamics   Good Support System? Yes   husband, grandson,  sister     Barriers   Psychosocial barriers to participate in program There are no identifiable barriers or psychosocial needs.      Screening Interventions   Interventions Encouraged to exercise;To provide support and resources with identified psychosocial needs;Provide feedback about the scores to participant    Expected Outcomes Short Term goal: Utilizing psychosocial counselor, staff and physician to assist with identification of  specific Stressors or current issues interfering with healing process. Setting desired goal for each stressor or current issue identified.;Long Term Goal: Stressors or current issues are controlled or eliminated.;Short Term goal: Identification and review with participant of any Quality of Life or Depression concerns found by scoring the questionnaire.;Long Term goal: The participant improves quality of Life and PHQ9 Scores as seen by post scores and/or verbalization of changes             Quality of Life Scores:   Quality of Life - 01/18/23 1107       Quality of Life   Select Quality of Life      Quality of Life Scores   Health/Function Pre 22.5 %    Socioeconomic Pre 26.88 %    Psych/Spiritual Pre 28.14 %    Family Pre 27.1 %    GLOBAL Pre 25.29 %            Scores of 19 and below usually indicate a poorer quality of life in these areas.  A difference of  2-3 points is a clinically meaningful difference.  A difference of 2-3 points in the total score of the Quality of Life Index has been associated with significant improvement in overall quality of life, self-image, physical symptoms, and general health in studies assessing change in quality of life.  PHQ-9: Review Flowsheet       03/09/2023 01/18/2023 08/18/2017  Depression screen PHQ 2/9  Decreased Interest 0 0 0  Down, Depressed, Hopeless 0 0 0  PHQ - 2 Score 0 0 0  Altered sleeping 3 3 -  Tired, decreased energy 3 3 -  Change in appetite 3 1 -  Feeling bad or failure about yourself  0 0 -  Trouble concentrating 0 0 -  Moving slowly or fidgety/restless 0 0 -  Suicidal thoughts 0 0 -  PHQ-9 Score 9 7 -  Difficult doing work/chores Not difficult at all Somewhat difficult -   Interpretation of Total Score  Total Score Depression Severity:  1-4 = Minimal depression, 5-9 = Mild depression, 10-14 = Moderate depression, 15-19 = Moderately severe depression, 20-27 = Severe depression   Psychosocial Evaluation and  Intervention:  Psychosocial Evaluation - 01/12/23 1149       Psychosocial Evaluation & Interventions   Interventions Encouraged to exercise with the program and follow exercise prescription    Comments There are no barriers to attending the program.  She does have exercise barriers.  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 Her doctor wants her to do the program.  She  lives with her husband.  He helps her as needed and she has a daughter,grandson and her sister. for support. She has been dealing with multiple hospitalizations this year.   One being shoulder surgery, PT after surgery.  She developed a new tear in her shoulder and cannot have any surgery. She is ready to attend the program    Expected Outcomes STG attends all scheduled sessions, is able to see exercise progression even with her limited use of legs and 1 arm. LTG  able to continue her exercise progression.    Continue Psychosocial Services  Follow up required by staff             Psychosocial Re-Evaluation:  Psychosocial Re-Evaluation     Row Name 02/28/23 0741 03/09/23 0741           Psychosocial Re-Evaluation   Current issues with Current Sleep Concerns Current Stress Concerns      Comments Jacqueline Rhodes states that she has no stress or mental health concerns at the moment, but does report that she has difficulty falling asleep and staying asleep. Reviewed patient health questionnaire (PHQ-9) with patient for follow up. Previously, patients score indicated signs/symptoms of depression.  Reviewed to see if patient is improving symptom wise while in program.  Score declined and patient states that it is because she is tired all the time and has learned to cope with it.      Expected Outcomes Short: Continue to attend [facility] regularly for regular exercise and social engagement. Long: Continue to improve symptoms and manage a positive mental state.  Short: Continue to work toward an improvement in PHQ9 scores by attending [facility] regularly. Long: Continue to improve stress and depression coping skills by talking with staff and attending [facility] regularly and work toward a positive mental state.      Interventions Encouraged to attend Cardiac Rehabilitation for the exercise Encouraged to attend Cardiac Rehabilitation for the exercise      Continue Psychosocial Services  Follow up required by staff Follow up required by staff               Psychosocial Discharge (Final Psychosocial Re-Evaluation):  Psychosocial Re-Evaluation - 03/09/23 0741       Psychosocial Re-Evaluation   Current issues with Current Stress Concerns    Comments Reviewed patient health questionnaire (PHQ-9) with patient for follow up. Previously, patients score indicated signs/symptoms of depression.  Reviewed to see if patient is improving symptom wise while in program.  Score declined and patient states that it is because she is tired all the time and has learned to cope with it.    Expected Outcomes Short: Continue to work toward an improvement in PHQ9 scores by attending [facility] regularly. Long: Continue to improve stress and depression coping skills by talking with staff and attending [facility] regularly and work toward a positive mental state.    Interventions Encouraged to attend Cardiac Rehabilitation for the exercise    Continue Psychosocial Services  Follow up required by staff             Vocational Rehabilitation: Provide vocational rehab assistance to qualifying candidates.   Vocational Rehab Evaluation & Intervention:  Vocational Rehab - 01/12/23 1138       Initial Vocational Rehab Evaluation & Intervention   Assessment shows need for Vocational Rehabilitation No      Vocational Rehab Re-Evaulation   Comments retired      Systems developer Rehabilitation retired  Education: Education Goals: Education classes will be provided on a variety of topics geared toward better understanding of heart health and risk factor modification. Participant will state understanding/return demonstration of topics presented as noted by education test scores.  Learning Barriers/Preferences:   General Cardiac Education Topics:  AED/CPR: - Group verbal and written instruction with the use of models to demonstrate the basic use of the AED with the basic ABC's of resuscitation.   Anatomy and Cardiac Procedures: - Group verbal and visual presentation and models provide information about basic cardiac anatomy and function. Reviews the testing methods done to diagnose heart disease and the outcomes of the test results. Describes the treatment choices: Medical Management, Angioplasty, or Coronary Bypass Surgery for treating various heart conditions including Myocardial Infarction, Angina, Valve Disease, and Cardiac Arrhythmias.  Written material given at graduation. Flowsheet Row Cardiac Rehab from 03/23/2023 in [facility] Cardiac and Pulmonary Rehab  Education need identified 01/18/23  Date 02/02/23  Educator SB  Instruction Review Code 1- Verbalizes Understanding       Medication Safety: - Group verbal and visual instruction to review commonly prescribed medications for heart and lung disease. Reviews the medication, class of the drug, and side effects. Includes the steps to properly store meds and maintain the prescription regimen.  Written material given at graduation.   Intimacy: - Group verbal instruction through game format to discuss how heart and lung disease can affect sexual intimacy. Written material given at graduation.. Flowsheet Row Cardiac Rehab from 03/23/2023 in [facility] Cardiac and Pulmonary Rehab  Education need identified 01/18/23  Date 03/23/23  Educator [facility]  Instruction Review Code 1- Verbalizes Understanding       Know Your Numbers and Heart  Failure: - Group verbal and visual instruction to discuss disease risk factors for cardiac and pulmonary disease and treatment options.  Reviews associated critical values for Overweight/Obesity, Hypertension, Cholesterol, and Diabetes.  Discusses basics of heart failure: signs/symptoms and treatments.  Introduces Heart Failure Zone chart for action plan for heart failure.  Written material given at graduation. Flowsheet Row Cardiac Rehab from 03/23/2023 in [facility] Cardiac and Pulmonary Rehab  Date 02/16/23  Educator SB  Instruction Review Code 1- Verbalizes Understanding       Infection Prevention: - Provides verbal and written material to individual with discussion of infection control including proper hand washing and proper equipment cleaning during exercise session. Flowsheet Row Cardiac Rehab from 03/23/2023 in [facility] Cardiac and Pulmonary Rehab  Date 01/18/23  Educator MB  Instruction Review Code 1- Verbalizes Understanding       Falls Prevention: - Provides verbal and written material to individual with discussion of falls prevention and safety. Flowsheet Row Cardiac Rehab from 03/23/2023 in [facility] Cardiac and Pulmonary Rehab  Date 01/18/23  Educator MB  Instruction Review Code 1- Verbalizes Understanding       Other: -Provides group and verbal instruction on various topics (see comments)   Knowledge Questionnaire Score:  Knowledge Questionnaire Score - 01/18/23 1110       Knowledge Questionnaire Score   Pre Score 18/26             Core Components/Risk Factors/Patient Goals at Admission:  Personal Goals and Risk Factors at Admission - 01/18/23 1110       Core Components/Risk Factors/Patient Goals on Admission    Weight Management Yes;Weight Loss    Intervention Weight Management: Develop a combined nutrition and exercise program designed to reach desired caloric intake, while maintaining appropriate intake of nutrient and fiber, sodium  and fats, and appropriate energy  expenditure required for the weight goal.;Weight Management: Provide education and appropriate resources to help participant work on and attain dietary goals.;Weight Management/Obesity: Establish reasonable short term and long term weight goals.    Admit Weight 245 lb 3.2 oz (111.2 kg)    Goal Weight: Short Term 240 lb (108.9 kg)    Goal Weight: Long Term 180 lb (81.6 kg)    Expected Outcomes Short Term: Continue to assess and modify interventions until short term weight is achieved;Long Term: Adherence to nutrition and physical activity/exercise program aimed toward attainment of established weight goal;Weight Loss: Understanding of general recommendations for a balanced deficit meal plan, which promotes 1-2 lb weight loss per week and includes a negative energy balance of [phone] kcal/d;Understanding recommendations for meals to include 15-35% energy as protein, 25-35% energy from fat, 35-60% energy from carbohydrates, less than 200mg  of dietary cholesterol, 20-35 gm of total fiber daily;Understanding of distribution of calorie intake throughout the day with the consumption of 4-5 meals/snacks    Hypertension Yes    Intervention Provide education on lifestyle modifcations including regular physical activity/exercise, weight management, moderate sodium restriction and increased consumption of fresh fruit, vegetables, and low fat dairy, alcohol moderation, and smoking cessation.;Monitor prescription use compliance.    Expected Outcomes Short Term: Continued assessment and intervention until BP is < 140/69mm HG in hypertensive participants. < 130/46mm HG in hypertensive participants with diabetes, heart failure or chronic kidney disease.;Long Term: Maintenance of blood pressure at goal levels.    Lipids Yes    Intervention Provide education and support for participant on nutrition & aerobic/resistive exercise along with prescribed medications to achieve LDL 70mg , HDL >40mg .    Expected Outcomes Short  Term: Participant states understanding of desired cholesterol values and is compliant with medications prescribed. Participant is following exercise prescription and nutrition guidelines.;Long Term: Cholesterol controlled with medications as prescribed, with individualized exercise RX and with personalized nutrition plan. Value goals: LDL < 70mg , HDL > 40 mg.             Education:Diabetes - Individual verbal and written instruction to review signs/symptoms of diabetes, desired ranges of glucose level fasting, after meals and with exercise. Acknowledge that pre and post exercise glucose checks will be done for 3 sessions at entry of program.   Core Components/Risk Factors/Patient Goals Review:   Goals and Risk Factor Review     Row Name 02/28/23 0748 03/09/23 0744           Core Components/Risk Factors/Patient Goals Review   Personal Goals Review Hypertension Weight Management/Obesity;Hypertension      Review Jacqueline Rhodes states that she only checks her blood pressure when she does not feel the best. We have encouraged her to take it at least once a day on non-program days. We will continue to check in with her about her hypertension. Jacqueline Rhodes states no other questions about her rehab. She is checking her blood pressure once a day at home. Her blood pressure has been within normal limits in Rehab. She wants to try to lose more weight and reach her weight goal.      Expected Outcomes Short: Start taking blood pressure everyday. Long: Continue to monitor hypertension Short: lose a few pounds in a couple weeks. Long: continue to work towards weight goal.               Core Components/Risk Factors/Patient Goals at Discharge (Final Review):   Goals and Risk Factor Review - 03/09/23 1610  Core Components/Risk Factors/Patient Goals Review   Personal Goals Review Weight Management/Obesity;Hypertension    Review Jacqueline Rhodes states no other questions about her rehab. She is checking her blood  pressure once a day at home. Her blood pressure has been within normal limits in Rehab. She wants to try to lose more weight and reach her weight goal.    Expected Outcomes Short: lose a few pounds in a couple weeks. Long: continue to work towards weight goal.             ITP Comments:  ITP Comments     Row Name 01/12/23 1156 01/18/23 1059 01/26/23 0819 02/01/23 0927 03/01/23 1144   ITP Comments Virtual orientation call completed today. shehas an appointment on Date: 01/18/2023  for EP eval and gym Orientation.  Documentation of diagnosis can be found in [facility] Date: 12/20/2022 . Completed and gym orientation. Initial ITP created and sent for review to Dr. Firman Hughes, Medical Director. First full day of exercise!  Patient was oriented to gym and equipment including functions, settings, policies, and procedures.  Patient's individual exercise prescription and treatment plan were reviewed.  All starting workloads were established based on the results of the 6 minute walk test done at initial orientation visit.  The plan for exercise progression was also introduced and progression will be customized based on patient's performance and goals. 30 Day review completed. Medical Director ITP review done, changes made as directed, and signed approval by Medical Director. new to program 30 Day review completed. Medical Director ITP review done, changes made as directed, and signed approval by Medical Director.            Comments: 30 day ITP

## 2023-03-31 DIAGNOSIS — M75121 Complete rotator cuff tear or rupture of right shoulder, not specified as traumatic: Secondary | ICD-10-CM | POA: Diagnosis not present

## 2023-03-31 DIAGNOSIS — M7581 Other shoulder lesions, right shoulder: Secondary | ICD-10-CM | POA: Diagnosis not present

## 2023-03-31 DIAGNOSIS — E119 Type 2 diabetes mellitus without complications: Secondary | ICD-10-CM | POA: Diagnosis not present

## 2023-03-31 DIAGNOSIS — Z9889 Other specified postprocedural states: Secondary | ICD-10-CM | POA: Diagnosis not present

## 2023-03-31 DIAGNOSIS — Z6841 Body Mass Index (BMI) 40.0 and over, adult: Secondary | ICD-10-CM | POA: Diagnosis not present

## 2023-03-31 DIAGNOSIS — E66813 Obesity, class 3: Secondary | ICD-10-CM | POA: Diagnosis not present

## 2023-04-06 ENCOUNTER — Encounter: Payer: PPO | Admitting: *Deleted

## 2023-04-06 DIAGNOSIS — Z955 Presence of coronary angioplasty implant and graft: Secondary | ICD-10-CM

## 2023-04-06 DIAGNOSIS — Z48812 Encounter for surgical aftercare following surgery on the circulatory system: Secondary | ICD-10-CM | POA: Diagnosis not present

## 2023-04-06 DIAGNOSIS — I213 ST elevation (STEMI) myocardial infarction of unspecified site: Secondary | ICD-10-CM

## 2023-04-06 NOTE — Progress Notes (Signed)
Daily Session Note  Patient Details  Name: Jacqueline Rhodes MRN: [id number] Date of Birth: February 18, 1956 Referring Provider:   Flowsheet Row Cardiac Rehab from 01/18/2023 in [facility] Cardiac and Pulmonary Rehab  Referring Provider Tiajuana Amass, MD       Encounter Date: 04/06/2023  Check In:  Session Check In - 04/06/23 0804       Check-In   Supervising physician immediately available to respond to emergencies See telemetry face sheet for immediately available ER MD    Location [facility]-Cardiac & Pulmonary Rehab    Staff Present Rory Percy, MS, Exercise Physiologist;Jason Wallace Cullens RDN,LDN;Noah Tickle, BS, Exercise Physiologist;Colyn Miron, RN, BSN, CCRP    Virtual Visit No    Medication changes reported     No    Fall or balance concerns reported    No    Warm-up and Cool-down Performed on first and last piece of equipment    Resistance Training Performed Yes    VAD Patient? No    PAD/SET Patient? No      Pain Assessment   Currently in Pain? No/denies                Social History   Tobacco Use  Smoking Status Former   Current packs/day: 0.00   Average packs/day: 0.5 packs/day for 30.0 years (15.0 ttl pk-yrs)   Types: Cigarettes   Start date: 74   Quit date: 2005   Years since quitting: 20.0  Smokeless Tobacco Never    Goals Met:  Independence with exercise equipment Exercise tolerated well No report of concerns or symptoms today  Goals Unmet:  Not Applicable  Comments: Pt able to follow exercise prescription today without complaint.  Will continue to monitor for progression.    Dr. Bethann Punches is Medical Director for [facility] Cardiac Rehabilitation.  Dr. Vida Rigger is Medical Director for [facility] Pulmonary Rehabilitation.

## 2023-04-07 DIAGNOSIS — I2510 Atherosclerotic heart disease of native coronary artery without angina pectoris: Secondary | ICD-10-CM | POA: Diagnosis not present

## 2023-04-07 DIAGNOSIS — E119 Type 2 diabetes mellitus without complications: Secondary | ICD-10-CM | POA: Diagnosis not present

## 2023-04-07 DIAGNOSIS — I872 Venous insufficiency (chronic) (peripheral): Secondary | ICD-10-CM | POA: Diagnosis not present

## 2023-04-07 DIAGNOSIS — I252 Old myocardial infarction: Secondary | ICD-10-CM | POA: Diagnosis not present

## 2023-04-07 DIAGNOSIS — E876 Hypokalemia: Secondary | ICD-10-CM | POA: Diagnosis not present

## 2023-04-07 DIAGNOSIS — I5022 Chronic systolic (congestive) heart failure: Secondary | ICD-10-CM | POA: Diagnosis not present

## 2023-04-07 DIAGNOSIS — E782 Mixed hyperlipidemia: Secondary | ICD-10-CM | POA: Diagnosis not present

## 2023-04-07 DIAGNOSIS — G4733 Obstructive sleep apnea (adult) (pediatric): Secondary | ICD-10-CM | POA: Diagnosis not present

## 2023-04-07 DIAGNOSIS — I10 Essential (primary) hypertension: Secondary | ICD-10-CM | POA: Diagnosis not present

## 2023-04-11 ENCOUNTER — Encounter: Payer: PPO | Admitting: *Deleted

## 2023-04-11 DIAGNOSIS — Z955 Presence of coronary angioplasty implant and graft: Secondary | ICD-10-CM

## 2023-04-11 DIAGNOSIS — I213 ST elevation (STEMI) myocardial infarction of unspecified site: Secondary | ICD-10-CM

## 2023-04-11 DIAGNOSIS — Z48812 Encounter for surgical aftercare following surgery on the circulatory system: Secondary | ICD-10-CM | POA: Diagnosis not present

## 2023-04-11 NOTE — Progress Notes (Signed)
Daily Session Note  Patient Details  Name: Jacqueline Rhodes MRN: [id number] Date of Birth: 1955/09/28 Referring Provider:   Flowsheet Row Cardiac Rehab from 01/18/2023 in [facility] Cardiac and Pulmonary Rehab  Referring Provider Tiajuana Amass, MD       Encounter Date: 04/11/2023  Check In:  Session Check In - 04/11/23 0819       Check-In   Supervising physician immediately available to respond to emergencies See telemetry face sheet for immediately available ER MD    Location [facility]-Cardiac & Pulmonary Rehab    Staff Present Rory Percy, MS, Exercise Physiologist;Maxon Conetta BS, Exercise Physiologist;Noah Tickle, BS, Exercise Physiologist;Jaquita Bessire, RN, BSN, CCRP    Virtual Visit No    Medication changes reported     No    Fall or balance concerns reported    No    Warm-up and Cool-down Performed on first and last piece of equipment    Resistance Training Performed Yes    VAD Patient? No    PAD/SET Patient? No      Pain Assessment   Currently in Pain? No/denies                Social History   Tobacco Use  Smoking Status Former   Current packs/day: 0.00   Average packs/day: 0.5 packs/day for 30.0 years (15.0 ttl pk-yrs)   Types: Cigarettes   Start date: 52   Quit date: 2005   Years since quitting: 20.0  Smokeless Tobacco Never    Goals Met:  Independence with exercise equipment Exercise tolerated well No report of concerns or symptoms today  Goals Unmet:  Not Applicable  Comments: Pt able to follow exercise prescription today without complaint.  Will continue to monitor for progression.    Dr. Bethann Punches is Medical Director for [facility] Cardiac Rehabilitation.  Dr. Vida Rigger is Medical Director for [facility] Pulmonary Rehabilitation.

## 2023-04-12 ENCOUNTER — Other Ambulatory Visit: Payer: Self-pay | Admitting: Otolaryngology

## 2023-04-12 DIAGNOSIS — Z85850 Personal history of malignant neoplasm of thyroid: Secondary | ICD-10-CM

## 2023-04-12 DIAGNOSIS — R221 Localized swelling, mass and lump, neck: Secondary | ICD-10-CM

## 2023-04-13 DIAGNOSIS — E890 Postprocedural hypothyroidism: Secondary | ICD-10-CM | POA: Diagnosis not present

## 2023-04-13 DIAGNOSIS — C73 Malignant neoplasm of thyroid gland: Secondary | ICD-10-CM | POA: Diagnosis not present

## 2023-04-18 ENCOUNTER — Encounter: Payer: PPO | Attending: Cardiovascular Disease | Admitting: *Deleted

## 2023-04-18 DIAGNOSIS — Z955 Presence of coronary angioplasty implant and graft: Secondary | ICD-10-CM | POA: Diagnosis not present

## 2023-04-18 DIAGNOSIS — I213 ST elevation (STEMI) myocardial infarction of unspecified site: Secondary | ICD-10-CM

## 2023-04-18 DIAGNOSIS — Z87891 Personal history of nicotine dependence: Secondary | ICD-10-CM | POA: Diagnosis not present

## 2023-04-18 DIAGNOSIS — Z48812 Encounter for surgical aftercare following surgery on the circulatory system: Secondary | ICD-10-CM | POA: Diagnosis not present

## 2023-04-18 DIAGNOSIS — I252 Old myocardial infarction: Secondary | ICD-10-CM | POA: Insufficient documentation

## 2023-04-18 NOTE — Progress Notes (Signed)
Daily Session Note  Patient Details  Name: Jacqueline Rhodes MRN: [id number] Date of Birth: 28-Jan-1956 Referring Provider:   Flowsheet Row Cardiac Rehab from 01/18/2023 in [facility] Cardiac and Pulmonary Rehab  Referring Provider Tiajuana Amass, MD       Encounter Date: 04/18/2023  Check In:  Session Check In - 04/18/23 0741       Check-In   Supervising physician immediately available to respond to emergencies See telemetry face sheet for immediately available ER MD    Location [facility]-Cardiac & Pulmonary Rehab    Staff Present Cora Collum, RN, BSN, CCRP;Margaret Best, MS, Exercise Physiologist;Noah Tickle, BS, Exercise Physiologist    Virtual Visit No    Medication changes reported     No    Fall or balance concerns reported    No    Warm-up and Cool-down Performed on first and last piece of equipment    Resistance Training Performed Yes    VAD Patient? No    PAD/SET Patient? No      Pain Assessment   Currently in Pain? No/denies                Social History   Tobacco Use  Smoking Status Former   Current packs/day: 0.00   Average packs/day: 0.5 packs/day for 30.0 years (15.0 ttl pk-yrs)   Types: Cigarettes   Start date: 57   Quit date: 2005   Years since quitting: 20.1  Smokeless Tobacco Never    Goals Met:  Independence with exercise equipment Exercise tolerated well No report of concerns or symptoms today  Goals Unmet:  Not Applicable  Comments: Pt able to follow exercise prescription today without complaint.  Will continue to monitor for progression.    Dr. Bethann Punches is Medical Director for [facility] Cardiac Rehabilitation.  Dr. Vida Rigger is Medical Director for [facility] Pulmonary Rehabilitation.

## 2023-04-19 ENCOUNTER — Ambulatory Visit
Admission: RE | Admit: 2023-04-19 | Discharge: 2023-04-19 | Disposition: A | Discharge status: Home / Self Care | Payer: PPO | Source: Ambulatory Visit | Attending: Otolaryngology | Admitting: Otolaryngology

## 2023-04-19 DIAGNOSIS — Z85850 Personal history of malignant neoplasm of thyroid: Secondary | ICD-10-CM | POA: Diagnosis not present

## 2023-04-19 DIAGNOSIS — R221 Localized swelling, mass and lump, neck: Secondary | ICD-10-CM

## 2023-04-19 MED ORDER — IOPAMIDOL (ISOVUE-300) INJECTION 61%
75.0000 mL | Freq: Once | INTRAVENOUS | Status: AC | PRN
  Administered 2023-04-19: 75 mL via INTRAVENOUS

## 2023-04-20 ENCOUNTER — Encounter: Payer: PPO | Admitting: *Deleted

## 2023-04-20 DIAGNOSIS — Z955 Presence of coronary angioplasty implant and graft: Secondary | ICD-10-CM

## 2023-04-20 DIAGNOSIS — Z48812 Encounter for surgical aftercare following surgery on the circulatory system: Secondary | ICD-10-CM | POA: Diagnosis not present

## 2023-04-20 DIAGNOSIS — I213 ST elevation (STEMI) myocardial infarction of unspecified site: Secondary | ICD-10-CM

## 2023-04-20 NOTE — Progress Notes (Signed)
 Daily Session Note  Patient Details  Name: Jacqueline Rhodes MRN: [id number] Date of Birth: 10-28-1955 Referring Provider:   Flowsheet Row Cardiac Rehab from 01/18/2023 in [facility] Cardiac and Pulmonary Rehab  Referring Provider Hilarie Rocher, MD       Encounter Date: 04/20/2023  Check In:  Session Check In - 04/20/23 0751       Check-In   Supervising physician immediately available to respond to emergencies See telemetry face sheet for immediately available ER MD    Location [facility]-Cardiac & Pulmonary Rehab    Staff Present Maxon Conetta BS, Exercise Physiologist;Joseph Hood RCP,RRT,BSRT;Noah Tickle, BS, Exercise Physiologist;Myrtice Lowdermilk, RN, BSN, CCRP    Virtual Visit No    Medication changes reported     No    Fall or balance concerns reported    No    Warm-up and Cool-down Performed on first and last piece of equipment    Resistance Training Performed Yes    VAD Patient? No    PAD/SET Patient? No      Pain Assessment   Currently in Pain? No/denies                Social History   Tobacco Use  Smoking Status Former   Current packs/day: 0.00   Average packs/day: 0.5 packs/day for 30.0 years (15.0 ttl pk-yrs)   Types: Cigarettes   Start date: 26   Quit date: 2005   Years since quitting: 20.1  Smokeless Tobacco Never    Goals Met:  Independence with exercise equipment Exercise tolerated well No report of concerns or symptoms today  Goals Unmet:  Not Applicable  Comments: Pt able to follow exercise prescription today without complaint.  Will continue to monitor for progression.    Dr. Oneil Pinal is Medical Director for [facility] Cardiac Rehabilitation.  Dr. Fuad Aleskerov is Medical Director for [facility] Pulmonary Rehabilitation.

## 2023-04-25 ENCOUNTER — Encounter: Payer: PPO | Admitting: *Deleted

## 2023-04-25 DIAGNOSIS — Z955 Presence of coronary angioplasty implant and graft: Secondary | ICD-10-CM

## 2023-04-25 DIAGNOSIS — I213 ST elevation (STEMI) myocardial infarction of unspecified site: Secondary | ICD-10-CM

## 2023-04-25 DIAGNOSIS — Z48812 Encounter for surgical aftercare following surgery on the circulatory system: Secondary | ICD-10-CM | POA: Diagnosis not present

## 2023-04-25 NOTE — Progress Notes (Signed)
Daily Session Note  Patient Details  Name: Jacqueline Rhodes MRN: [id number] Date of Birth: December 19, 1955 Referring Provider:   Flowsheet Row Cardiac Rehab from 01/18/2023 in [facility] Cardiac and Pulmonary Rehab  Referring Provider Tiajuana Amass, MD       Encounter Date: 04/25/2023  Check In:  Session Check In - 04/25/23 0753       Check-In   Supervising physician immediately available to respond to emergencies See telemetry face sheet for immediately available ER MD    Location [facility]-Cardiac & Pulmonary Rehab    Staff Present Cora Collum, RN, BSN, CCRP;Margaret Best, MS, Exercise Physiologist;Noah Tickle, BS, Exercise Physiologist    Virtual Visit No    Medication changes reported     No    Fall or balance concerns reported    No    Warm-up and Cool-down Performed on first and last piece of equipment    Resistance Training Performed Yes    VAD Patient? No    PAD/SET Patient? No      Pain Assessment   Currently in Pain? No/denies                Social History   Tobacco Use  Smoking Status Former   Current packs/day: 0.00   Average packs/day: 0.5 packs/day for 30.0 years (15.0 ttl pk-yrs)   Types: Cigarettes   Start date: 47   Quit date: 2005   Years since quitting: 20.1  Smokeless Tobacco Never    Goals Met:  Independence with exercise equipment Exercise tolerated well No report of concerns or symptoms today  Goals Unmet:  Not Applicable  Comments: Pt able to follow exercise prescription today without complaint.  Will continue to monitor for progression.    Dr. Bethann Punches is Medical Director for [facility] Cardiac Rehabilitation.  Dr. Vida Rigger is Medical Director for [facility] Pulmonary Rehabilitation.

## 2023-04-26 ENCOUNTER — Ambulatory Visit: Payer: PPO

## 2023-04-26 DIAGNOSIS — I213 ST elevation (STEMI) myocardial infarction of unspecified site: Secondary | ICD-10-CM

## 2023-04-26 DIAGNOSIS — Z955 Presence of coronary angioplasty implant and graft: Secondary | ICD-10-CM

## 2023-04-26 NOTE — Progress Notes (Signed)
Cardiac Individual Treatment Plan  Patient Details  Name: Jacqueline Rhodes MRN: [id number] Date of Birth: [birth year] Referring Provider:   Flowsheet Row Cardiac Rehab from 01/18/2023 in [facility] Cardiac and Pulmonary Rehab  Referring Provider Tiajuana Amass, MD       Initial Encounter Date:  Flowsheet Row Cardiac Rehab from 01/18/2023 in [facility] Cardiac and Pulmonary Rehab  Date 01/18/23       Visit Diagnosis: ST elevation myocardial infarction (STEMI), unspecified artery [facility])  Status post coronary artery stent placement  Patient's Home Medications on Admission:  Current Outpatient Medications:    acetaminophen (TYLENOL) 500 MG tablet, Take 2 tablets (1,000 mg total) by mouth every 8 (eight) hours. (Patient taking differently: Take 1,000 mg by mouth every 8 (eight) hours as needed for mild pain (pain score 1-3), moderate pain (pain score 4-6), fever or headache.), Disp: 90 tablet, Rfl: 2   albuterol (PROVENTIL HFA;VENTOLIN HFA) 108 (90 Base) MCG/ACT inhaler, Inhale 2 puffs into the lungs every 6 (six) hours as needed for wheezing or shortness of breath. , Disp: , Rfl:    aspirin 81 MG chewable tablet, Chew 1 tablet (81 mg total) by mouth daily., Disp: 30 tablet, Rfl: 0   Calcium Fructoborate POWD, 20 mcg by Does not apply route. Cream, Disp: , Rfl:    Cetirizine HCl 10 MG CAPS, Take 10 mg by mouth daily. , Disp: , Rfl:    Cholecalciferol 25 MCG (1000 UT) TBDP, Take 125 mcg by mouth., Disp: , Rfl:    Coenzyme Q10 (COQ-10) 100 MG CAPS, Take 100 mg by mouth daily. , Disp: , Rfl:    ezetimibe (ZETIA) 10 MG tablet, Take 1 tablet (10 mg total) by mouth daily., Disp: 30 tablet, Rfl: 0   fluticasone furoate-vilanterol (BREO ELLIPTA) 100-25 MCG/INH AEPB, Inhale 1 puff into the lungs daily. , Disp: , Rfl:    folic acid (FOLVITE) 1 MG tablet, Take 1 mg by mouth daily., Disp: , Rfl:    furosemide (LASIX) 20 MG tablet, Take 20 mg by mouth daily as needed for fluid or edema., Disp: , Rfl:     isosorbide mononitrate (IMDUR) 60 MG 24 hr tablet, Take 2 tablets (120 mg total) by mouth daily., Disp: 60 tablet, Rfl: 0   levothyroxine (SYNTHROID) 150 MCG tablet, Take 150 mcg by mouth daily before breakfast., Disp: , Rfl:    meclizine (ANTIVERT) 25 MG tablet, Take 25 mg by mouth 3 (three) times daily as needed for dizziness., Disp: , Rfl:    methotrexate (RHEUMATREX) 2.5 MG tablet, Take 20 mg by mouth every Thursday. , Disp: , Rfl:    metoprolol succinate (TOPROL-XL) 25 MG 24 hr tablet, Take 1 tablet (25 mg total) by mouth 2 (two) times daily., Disp: 60 tablet, Rfl: 0   nitroGLYCERIN (NITROSTAT) 0.4 MG SL tablet, Place 1 tablet (0.4 mg total) under the tongue once every 5 (five) minutes as needed for chest pain. (Max of 3 doses in 15 minutes)., Disp: 25 tablet, Rfl: 0   pantoprazole (PROTONIX) 40 MG tablet, Take 40 mg by mouth 2 (two) times daily., Disp: , Rfl:    pravastatin (PRAVACHOL) 20 MG tablet, Take 1 tablet (20 mg total) by mouth daily., Disp: 30 tablet, Rfl: 0   Secukinumab (COSENTYX SENSOREADY 300 DOSE) 150 MG/ML SOAJ, Inject 300 mg into the skin every 28 (twenty-eight) days.  (Patient not taking: Reported on 01/12/2023), Disp: , Rfl:    spironolactone (ALDACTONE) 25 MG tablet, Take 0.5 tablet (12.5 mg total) by mouth  daily., Disp: 15 tablet, Rfl: 0   ticagrelor (BRILINTA) 90 MG TABS tablet, Take 1 tablet (90 mg total) by mouth 2 (two) times daily., Disp: 60 tablet, Rfl: 0   UNABLE TO FIND, Take 1.5 drops by mouth daily at 2 PM. Med Name: B12  5,000 unit, B6 3400 unit B1  and Niacin, Disp: , Rfl:   Past Medical History: Past Medical History:  Diagnosis Date   Anemia    Anginal pain (HCC)    Aortic atherosclerosis (HCC)    Asthma    B12 deficiency    Carpal tunnel syndrome    CHF (congestive heart failure) (HCC)    Chronic venous insufficiency    Complete tear of right rotator cuff    COPD (chronic obstructive pulmonary disease) (HCC)    Coronary artery disease 01/07/2005    a.) LHC 01/07/2005 (NSTEMI): LHC 60% pLAD, 70% D1, 30% pLCx, 20% pRCA, 25% dRCA - med mgmt; b.) LHC ([place]) 01/10/2008: 25/60% pLAD, 70% D1, 20% pLCx, 20% mRCA --> FFR normal --> med mgmt; c.) LHC ([place]) 01/04/2012:: 40/60% pLAD, 70% D1, 25% pLCx, 20% pRCA, 20% mRCA, 25% dRCA --> FFR normal --> med mgmt; d.) LHC ([place]) 01/10/2018: 20% pRCA, 50% pLAD, 55% oD1 -- med mgmt   Diverticulosis    DOE (dyspnea on exertion)    GERD (gastroesophageal reflux disease)    History of bilateral cataract extraction 2014   Hyperlipidemia    Hypertension    Hypothyroidism    a.) s/p LEFT hemithyroidectomy; on levothyroxine   Long term current use of aspirin    Long term current use of immunosuppressive drug    a.) MTX + secukinumab for RA/psoriatric arthritis diagnoses   Lower extremity edema    Lymphedema    NSTEMI (non-ST elevated myocardial infarction) (HCC) 01/06/2005   a.) LHC 01/07/2005: 60% pLAD, 70% D1, 30% pLCx, 20% pRCA, 25% dRCA -- med mgmt   OSA on CPAP    a.) compliance issues with prescribed nocturnal PAP therapy   Osteoarthritis    Peripheral vascular disease (HCC)    Post-menopausal osteoporosis    Psoriatic arthritis (HCC)    RA (rheumatoid arthritis) (HCC)    Renal insufficiency    T2DM (type 2 diabetes mellitus) (HCC)    Thyroid cancer (HCC)    a.) s/p LEFT hemithyroidectomy 08/2015    Tobacco Use: Social History   Tobacco Use  Smoking Status Former   Current packs/day: 0.00   Average packs/day: 0.5 packs/day for 30.0 years (15.0 ttl pk-yrs)   Types: Cigarettes   Start date: 16   Quit date: 2005   Years since quitting: 20.1  Smokeless Tobacco Never    Labs: Review Flowsheet       Latest Ref Rng & Units 11/11/2022 12/21/2022  Labs for ITP Cardiac and Pulmonary Rehab  Cholestrol 0 - 200 mg/dL - 607   LDL (calc) 0 - 99 mg/dL - 371   HDL-C >06 mg/dL - 50   Trlycerides <269 mg/dL - 60   Hemoglobin S8N 4.8 - 5.6 % 6.3  -     Exercise Target Goals: Exercise Program  Goal: Individual exercise prescription set using results from initial 6 min walk test and THRR while considering  patient's activity barriers and safety.   Exercise Prescription Goal: Initial exercise prescription builds to 30-45 minutes a day of aerobic activity, 2-3 days per week.  Home exercise guidelines will be given to patient during program as part of exercise prescription that the participant will acknowledge.  Education: Aerobic Exercise: - Group verbal and visual presentation on the components of exercise prescription. Introduces F.I.T.T principle from ACSM for exercise prescriptions.  Reviews F.I.T.T. principles of aerobic exercise including progression. Written material given at graduation. Flowsheet Row Cardiac Rehab from 04/20/2023 in [facility] Cardiac and Pulmonary Rehab  Education need identified 01/18/23  Date 03/23/23  Educator MC  Instruction Review Code 1- [employer] Understanding       Education: Resistance Exercise: - Group verbal and visual presentation on the components of exercise prescription. Introduces F.I.T.T principle from ACSM for exercise prescriptions  Reviews F.I.T.T. principles of resistance exercise including progression. Written material given at graduation. Flowsheet Row Cardiac Rehab from 04/20/2023 in [facility] Cardiac and Pulmonary Rehab  Date 03/16/23  Educator MB  Instruction Review Code 1- [employer] Understanding        Education: Exercise & Equipment Safety: - Individual verbal instruction and demonstration of equipment use and safety with use of the equipment. Flowsheet Row Cardiac Rehab from 04/20/2023 in [facility] Cardiac and Pulmonary Rehab  Date 01/18/23  Educator MB  Instruction Review Code 1- Verbalizes Understanding       Education: Exercise Physiology & General Exercise Guidelines: - Group verbal and written instruction with models to review the exercise physiology of the cardiovascular system and associated critical values. Provides general  exercise guidelines with specific guidelines to those with heart or lung disease.  Flowsheet Row Cardiac Rehab from 04/20/2023 in [facility] Cardiac and Pulmonary Rehab  Education need identified 01/18/23  Date 03/09/23  Educator MC  Instruction Review Code 1- [employer] Understanding       Education: Flexibility, Balance, Mind/Body Relaxation: - Group verbal and visual presentation with interactive activity on the components of exercise prescription. Introduces F.I.T.T principle from ACSM for exercise prescriptions. Reviews F.I.T.T. principles of flexibility and balance exercise training including progression. Also discusses the mind body connection.  Reviews various relaxation techniques to help reduce and manage stress (i.e. Deep breathing, progressive muscle relaxation, and visualization). Balance handout provided to take home. Written material given at graduation. Flowsheet Row Cardiac Rehab from 04/20/2023 in [facility] Cardiac and Pulmonary Rehab  Date 03/16/23  Educator MB  Instruction Review Code 1- Verbalizes Understanding       Activity Barriers & Risk Stratification:  Activity Barriers & Cardiac Risk Stratification - 01/18/23 1102       Activity Barriers & Cardiac Risk Stratification   Activity Barriers Joint Problems;Other (comment);Arthritis;Right Knee Replacement    Comments nerve damage knee down R nerve damage to foot to toes. left foot, Right Shoulder tear    Cardiac Risk Stratification High             6 Minute Walk:  6 Minute Walk     Row Name 01/18/23 1059         6 Minute Walk   Phase Initial     Distance 600 feet     Walk Time 5.75 minutes     # of Rest Breaks 2     MPH 1.19     METS 1     RPE 13     Perceived Dyspnea  3     VO2 Peak 3.46     Symptoms No     Resting HR 80 bpm     Resting BP 130/64     Resting Oxygen Saturation  98 %     Exercise Oxygen Saturation  during 6 min walk 98 %     Max Ex. HR 91 bpm  Max Ex. BP 142/70     2 Minute Post BP  126/66              Oxygen Initial Assessment:   Oxygen Re-Evaluation:   Oxygen Discharge (Final Oxygen Re-Evaluation):   Initial Exercise Prescription:  Initial Exercise Prescription - 01/18/23 1100       Date of Initial Exercise RX and Referring Provider   Date 01/18/23    Referring Provider Tiajuana Amass, MD      Oxygen   Maintain Oxygen Saturation 88% or higher      Recumbant Bike   Level 1    RPM 50    Watts 15    Minutes 15    METs 1      NuStep   Level 1    SPM 80    Minutes 15    METs 1      Biostep-RELP   Level 1    SPM 50    Minutes 15    METs 1      Track   Laps 10    Minutes 15    METs 1.54      Prescription Details   Frequency (times per week) 2    Duration Progress to 30 minutes of continuous aerobic without signs/symptoms of physical distress      Intensity   THRR 40-80% of Max Heartrate 108-136    Ratings of Perceived Exertion 11-13    Perceived Dyspnea 0-4      Progression   Progression Continue to progress workloads to maintain intensity without signs/symptoms of physical distress.      Resistance Training   Training Prescription Yes    Weight 2 lb    Reps 10-15             Perform Capillary Blood Glucose checks as needed.  Exercise Prescription Changes:   Exercise Prescription Changes     Row Name 01/18/23 1100 02/07/23 1400 02/23/23 1500 03/02/23 0700 03/09/23 0900     Response to Exercise   Blood Pressure (Admit) 130/64 106/60 132/78 -- 132/60   Blood Pressure (Exercise) 142/70 158/70 -- -- 164/64   Blood Pressure (Exit) 126/66 110/60 112/60 -- 122/66   Heart Rate (Admit) 90 bpm 58 bpm 57 bpm -- 60 bpm   Heart Rate (Exercise) 91 bpm 126 bpm 100 bpm -- 108 bpm   Heart Rate (Exit) 80 bpm 86 bpm 81 bpm -- 66 bpm   Oxygen Saturation (Admit) 98 % -- -- -- --   Oxygen Saturation (Exercise) 98 % -- -- -- --   Oxygen Saturation (Exit) 97 % -- -- -- --   Rating of Perceived Exertion (Exercise) 13 -- 16 -- 16    Perceived Dyspnea (Exercise) 3 -- -- -- --   Symptoms none none none -- none   Comments results First 3 days of full exercise -- -- --   Duration Progress to 30 minutes of  aerobic without signs/symptoms of physical distress Continue with 30 min of aerobic exercise without signs/symptoms of physical distress. Continue with 30 min of aerobic exercise without signs/symptoms of physical distress. -- Continue with 30 min of aerobic exercise without signs/symptoms of physical distress.   Intensity THRR New THRR unchanged THRR unchanged -- THRR unchanged     Progression   Progression Continue to progress workloads to maintain intensity without signs/symptoms of physical distress. Continue to progress workloads to maintain intensity without signs/symptoms of physical distress. Continue to progress workloads to maintain intensity  without signs/symptoms of physical distress. -- Continue to progress workloads to maintain intensity without signs/symptoms of physical distress.   Average METs 1 2.43 2.69 -- 3.07     Resistance Training   Training Prescription -- Yes Yes -- Yes   Weight -- 2 lb 3 lb -- 3 lb   Reps -- 10-15 10-15 -- 10-15     Interval Training   Interval Training -- No No -- No     Recumbant Bike   Level -- 3 1 -- 1   Watts -- 15 16 -- 15   Minutes -- 15 15 -- 15   METs -- 2.42 2.46 -- 2.43     NuStep   Level -- 2 2 -- 2   Minutes -- 15 15 -- 15   METs -- 3 3.2 -- 4.2     Biostep-RELP   Level -- 1 1 -- 1   Minutes -- 15 15 -- 15   METs -- -- 3 -- 3     Home Exercise Plan   Plans to continue exercise at -- -- -- [employer] (comment)  Plans to walk the perimeter of stores or the [employer] (comment)  Plans to walk the perimeter of stores or the mall   Frequency -- -- -- Add 3 additional days to program exercise sessions. Add 3 additional days to program exercise sessions.   Initial Home Exercises Provided -- -- -- 03/02/23 03/02/23     Oxygen    Maintain Oxygen Saturation -- 88% or higher 88% or higher 88% or higher 88% or higher    Row Name 03/23/23 1600 04/06/23 1100 04/20/23 0800         Response to Exercise   Blood Pressure (Admit) 144/80 118/68 138/74     Blood Pressure (Exit) 118/62 108/62 112/66     Heart Rate (Admit) 84 bpm 105 bpm 96 bpm     Heart Rate (Exercise) 112 bpm 96 bpm 120 bpm     Heart Rate (Exit) 68 bpm 130 bpm 78 bpm     Rating of Perceived Exertion (Exercise) 13 15 15      Perceived Dyspnea (Exercise) -- 2 --     Symptoms knee/leg pain none none     Duration Continue with 30 min of aerobic exercise without signs/symptoms of physical distress. Continue with 30 min of aerobic exercise without signs/symptoms of physical distress. Continue with 30 min of aerobic exercise without signs/symptoms of physical distress.     Intensity THRR unchanged THRR unchanged THRR unchanged       Progression   Progression Continue to progress workloads to maintain intensity without signs/symptoms of physical distress. Continue to progress workloads to maintain intensity without signs/symptoms of physical distress. Continue to progress workloads to maintain intensity without signs/symptoms of physical distress.     Average METs 3.83 3.28 3.03       Resistance Training   Training Prescription Yes Yes Yes     Weight 3 lb 3 lb 3 lb     Reps 10-15 10-15 10-15       Interval Training   Interval Training No No No       Recumbant Bike   Level -- 1 1     Watts -- 18 18     Minutes -- 21 15     METs -- 2.52 2.2       NuStep   Level 3 3 3      Minutes 15 15 15  METs 4.6 4.1 4.2       Biostep-RELP   Level 1 1 2      Minutes 15 15 15      METs 3 3 2        Home Exercise Plan   Plans to continue exercise at [facility] (comment)  Plans to walk the perimeter of stores or the mall [employer] (comment)  Plans to walk the perimeter of stores or the mall [employer] (comment)  Plans to walk the perimeter of  stores or the mall     Frequency Add 3 additional days to program exercise sessions. Add 3 additional days to program exercise sessions. Add 3 additional days to program exercise sessions.     Initial Home Exercises Provided 03/02/23 03/02/23 03/02/23       Oxygen   Maintain Oxygen Saturation 88% or higher 88% or higher 88% or higher              Exercise Comments:   Exercise Comments     Row Name 01/12/23 1157 01/26/23 0820         Exercise Comments Both of her legs have nerve damage and it is hard for her to walk long distances, She cannot use the treadmill. Her doctor wants her to do the program. One being shoulder surgery, PT after surgery. She developed a new tear in her shoulder and cannot have any surgery. First full day of exercise!  Patient was oriented to gym and equipment including functions, settings, policies, and procedures.  Patient's individual exercise prescription and treatment plan were reviewed.  All starting workloads were established based on the results of the 6 minute walk test done at initial orientation visit.  The plan for exercise progression was also introduced and progression will be customized based on patient's performance and goals.               Exercise Goals and Review:   Exercise Goals     Row Name 01/18/23 1106             Exercise Goals   Increase Physical Activity Yes       Intervention Provide advice, education, support and counseling about physical activity/exercise needs.;Develop an individualized exercise prescription for aerobic and resistive training based on initial evaluation findings, risk stratification, comorbidities and participant's personal goals.       Expected Outcomes Short Term: Attend rehab on a regular basis to increase amount of physical activity.;Long Term: Add in home exercise to make exercise part of routine and to increase amount of physical activity.;Long Term: Exercising regularly at least 3-5 days a week.        Increase Strength and Stamina Yes       Intervention Provide advice, education, support and counseling about physical activity/exercise needs.;Develop an individualized exercise prescription for aerobic and resistive training based on initial evaluation findings, risk stratification, comorbidities and participant's personal goals.       Expected Outcomes Short Term: Increase workloads from initial exercise prescription for resistance, speed, and METs.;Short Term: Perform resistance training exercises routinely during rehab and add in resistance training at home;Long Term: Improve cardiorespiratory fitness, muscular endurance and strength as measured by increased METs and functional capacity ( )       Able to understand and use rate of perceived exertion (RPE) scale Yes       Intervention Provide education and explanation on how to use RPE scale       Expected Outcomes Short Term: Able to use  RPE daily in rehab to express subjective intensity level;Long Term:  Able to use RPE to guide intensity level when exercising independently       Able to understand and use Dyspnea scale Yes       Intervention Provide education and explanation on how to use Dyspnea scale       Expected Outcomes Short Term: Able to use Dyspnea scale daily in rehab to express subjective sense of shortness of breath during exertion;Long Term: Able to use Dyspnea scale to guide intensity level when exercising independently       Knowledge and understanding of Target Heart Rate Range (THRR) Yes       Intervention Provide education and explanation of THRR including how the numbers were predicted and where they are located for reference       Expected Outcomes Short Term: Able to state/look up THRR;Short Term: Able to use daily as guideline for intensity in rehab;Long Term: Able to use THRR to govern intensity when exercising independently       Able to check pulse independently Yes       Intervention Provide education and  demonstration on how to check pulse in carotid and radial arteries.;Review the importance of being able to check your own pulse for safety during independent exercise       Expected Outcomes Short Term: Able to explain why pulse checking is important during independent exercise;Long Term: Able to check pulse independently and accurately       Understanding of Exercise Prescription Yes       Intervention Provide education, explanation, and written materials on patient's individual exercise prescription       Expected Outcomes Short Term: Able to explain program exercise prescription;Long Term: Able to explain home exercise prescription to exercise independently                Exercise Goals Re-Evaluation :  Exercise Goals Re-Evaluation     Row Name 01/26/23 0820 02/07/23 1447 02/23/23 1549 02/28/23 0739 03/02/23 0756     Exercise Goal Re-Evaluation   Exercise Goals Review Able to understand and use rate of perceived exertion (RPE) scale;Able to understand and use Dyspnea scale;Knowledge and understanding of Target Heart Rate Range (THRR);Understanding of Exercise Prescription Increase Physical Activity;Increase Strength and Stamina;Understanding of Exercise Prescription Increase Physical Activity;Increase Strength and Stamina;Understanding of Exercise Prescription Increase Physical Activity;Knowledge and understanding of Target Heart Rate Range (THRR);Able to check pulse independently Increase Physical Activity;Knowledge and understanding of Target Heart Rate Range (THRR);Able to check pulse independently;Able to understand and use Dyspnea scale;Increase Strength and Stamina;Able to understand and use rate of perceived exertion (RPE) scale;Understanding of Exercise Prescription   Comments Reviewed RPE and dyspnea scale, THR and program prescription with pt today.  Pt voiced understanding and was given a copy of goals to take home. Jacqueline Rhodes is off to a good start in the program. She did well as she  increased to level 3 on the recumbent bike and level 2 on the T4 nustep. She did not do any walking during her first 3 sessions of rehab. We will continue to monitor her progress in the program. Jacqueline Rhodes is doing well in the program. She has continued to work at level 2 on the T4 nustep and level 1 on the biostep. She also decreased her recumbent bike workload back down to level 1 after previously working at level 3. She has not done any walking during the program at this time. She did increase from 2 lb to  3 lb hand weights. We will continue to monitor her progress in the program. Jacqueline Rhodes continues to do well in the program, but is awaiting a home exercise prescription. We will continue to monitor her progress in the program and prescribe home exercise when appropriate. Reviewed home exercise with pt today.  Pt plans to walk the perimeter of stores and the mall for exercise.  Reviewed THR, pulse, RPE, sign and symptoms, pulse oximetery and when to call 911 or MD.  Also discussed weather considerations and indoor options.  Pt voiced understanding.   Expected Outcomes Short: Use RPE daily to regulate intensity. Long: Follow program prescription in THR. Short: Continue to follow current exercise prescription. Long: Continue exercise to improve strength and stamina. Short: Increase back up to level 3 on the recumbent bike, begin walking the track. Long: Continue exercise to improve strength and stamina. Short: Meet with an EP to discuss home exercise plans. Long: Continue exercise and eventually start home exercise regiment. Short: Begin walking on days away from rehab. Long: Continue exercise to improve strength and stamina.    Row Name 03/09/23 0945 03/23/23 1623 04/06/23 1120 04/11/23 0735 04/20/23 0820     Exercise Goal Re-Evaluation   Exercise Goals Review Increase Physical Activity;Increase Strength and Stamina;Understanding of Exercise Prescription Increase Physical Activity;Increase Strength and  Stamina;Understanding of Exercise Prescription Increase Physical Activity;Increase Strength and Stamina;Understanding of Exercise Prescription Increase Physical Activity;Increase Strength and Stamina;Understanding of Exercise Prescription Increase Physical Activity;Increase Strength and Stamina;Understanding of Exercise Prescription   Comments Jacqueline Rhodes is doing great in rehab. She hasnt been able to increase her levels, but has been able to maintain her workloads on the T4 nustep, biostep and recumbent bike. We will continue to encourage and monitor her progression in the program. Jacqueline Rhodes continues to do well in rehab. She recently increased from level 2 to level 3 on the T4 nustep. She also has done well on the biostep and continues to work at level 1. We will continue to monitor her progress in the program. Jacqueline Rhodes continues to do well in rehab. She has maintained level 3 on the T4 nustep. She continues to work at level 1 on the biostep and recumbent bike. We will continue to monitor her progress in the program. Jacqueline Rhodes continues to do well with exercise. She has been progressively increasing workloads in rehab and is steadily improving. Walking around at stores when she is not in rehab. She reports that she walks around most of the store when she goes in order to get her steps in. We will continue to monitor her progress in the program. Jacqueline Rhodes has only attended two sessions since the last review. She has continued to work at level 3 on the T4 nustep and level 1 on the recumbent bike. She did improve to level 2 on the biostep as well. We will continue to monitor her progress in the program.   Expected Outcomes Short: Increase to level 2 on the recumbent bike and biostep. Long: Continue exercise to improve strength and stamina. Short: Increase to level 2 on the biostep. Long: Continue exercise to improve strength and stamina. Short: Increase to level 2 on the biostep and recumbent bike. Long: Continue exercise to  improve strength and stamina. Short: Continue to walk on days away from rehab. Long: Continue exercise to improve strength and stamina. Short: Attend rehab more consistently. Long: Continue exercise to improve strength and stamina.            Discharge Exercise Prescription (Final Exercise  Prescription Changes):  Exercise Prescription Changes - 04/20/23 0800       Response to Exercise   Blood Pressure (Admit) 138/74    Blood Pressure (Exit) 112/66    Heart Rate (Admit) 96 bpm    Heart Rate (Exercise) 120 bpm    Heart Rate (Exit) 78 bpm    Rating of Perceived Exertion (Exercise) 15    Symptoms none    Duration Continue with 30 min of aerobic exercise without signs/symptoms of physical distress.    Intensity THRR unchanged      Progression   Progression Continue to progress workloads to maintain intensity without signs/symptoms of physical distress.    Average METs 3.03      Resistance Training   Training Prescription Yes    Weight 3 lb    Reps 10-15      Interval Training   Interval Training No      Recumbant Bike   Level 1    Watts 18    Minutes 15    METs 2.2      NuStep   Level 3    Minutes 15    METs 4.2      Biostep-RELP   Level 2    Minutes 15    METs 2      Home Exercise Plan   Plans to continue exercise at [employer] (comment)   Plans to walk the perimeter of stores or the mall   Frequency Add 3 additional days to program exercise sessions.    Initial Home Exercises Provided 03/02/23      Oxygen   Maintain Oxygen Saturation 88% or higher             Nutrition:  Target Goals: Understanding of nutrition guidelines, daily intake of sodium 1500mg , cholesterol 200mg , calories 30% from fat and 7% or less from saturated fats, daily to have 5 or more servings of fruits and vegetables.  Education: All About Nutrition: -Group instruction provided by verbal, written material, interactive activities, discussions, models, and posters to present  general guidelines for heart healthy nutrition including fat, fiber, MyPlate, the role of sodium in heart healthy nutrition, utilization of the nutrition label, and utilization of this knowledge for meal planning. Follow up email sent as well. Written material given at graduation. Flowsheet Row Cardiac Rehab from 04/20/2023 in [facility] Cardiac and Pulmonary Rehab  Education need identified 01/18/23  Date 01/26/23  Educator JG  Instruction Review Code 1- Verbalizes Understanding       Biometrics:  Pre Biometrics - 01/18/23 1106       Pre Biometrics   Height 5' 5.5" (1.664 m)    Weight 245 lb 3.2 oz (111.2 kg)    Waist Circumference 44.5 inches    Hip Circumference 55 inches    Waist to Hip Ratio 0.81 %    BMI (Calculated) 40.17    Single Leg Stand 0.6 seconds              Nutrition Therapy Plan and Nutrition Goals:  Nutrition Therapy & Goals - 01/18/23 1108       Nutrition Therapy   RD appointment deferred Yes      Personal Nutrition Goals   Nutrition Goal Patient deferred RD appointment      Intervention Plan   Intervention Prescribe, educate and counsel regarding individualized specific dietary modifications aiming towards targeted core components such as weight, hypertension, lipid management, diabetes, heart failure and other comorbidities.;Nutrition handout(s) given to patient.    Expected  Outcomes Short Term Goal: Understand basic principles of dietary content, such as calories, fat, sodium, cholesterol and nutrients.;Short Term Goal: A plan has been developed with personal nutrition goals set during dietitian appointment.;Long Term Goal: Adherence to prescribed nutrition plan.             Nutrition Assessments:  MEDIFICTS Score Key: >=70 Need to make dietary changes  40-70 Heart Healthy Diet <= 40 Therapeutic Level Cholesterol Diet  Flowsheet Row Cardiac Rehab from 01/18/2023 in [facility] Cardiac and Pulmonary Rehab  Picture Your Plate Total Score on Admission 64       Picture Your Plate Scores: <16 Unhealthy dietary pattern with much room for improvement. 41-50 Dietary pattern unlikely to meet recommendations for good health and room for improvement. 51-60 More healthful dietary pattern, with some room for improvement.  >60 Healthy dietary pattern, although there may be some specific behaviors that could be improved.    Nutrition Goals Re-Evaluation:  Nutrition Goals Re-Evaluation     Row Name 02/28/23 0748 03/09/23 0739 04/11/23 0742         Goals   Nutrition Goal Patient deferred RD appointment Patient deferred RD appointment Patient deferred RD appointment              Nutrition Goals Discharge (Final Nutrition Goals Re-Evaluation):  Nutrition Goals Re-Evaluation - 04/11/23 0742       Goals   Nutrition Goal Patient deferred RD appointment             Psychosocial: Target Goals: Acknowledge presence or absence of significant depression and/or stress, maximize coping skills, provide positive support system. Participant is able to verbalize types and ability to use techniques and skills needed for reducing stress and depression.   Education: Stress, Anxiety, and Depression - Group verbal and visual presentation to define topics covered.  Reviews how body is impacted by stress, anxiety, and depression.  Also discusses healthy ways to reduce stress and to treat/manage anxiety and depression.  Written material given at graduation. Flowsheet Row Cardiac Rehab from 04/20/2023 in [facility] Cardiac and Pulmonary Rehab  Education need identified 01/18/23  Date 03/02/23  Educator SB  Instruction Review Code 1- [employer] Understanding       Education: Sleep Hygiene -Provides group verbal and written instruction about how sleep can affect your health.  Define sleep hygiene, discuss sleep cycles and impact of sleep habits. Review good sleep hygiene tips.    Initial Review & Psychosocial Screening:  Initial Psych Review & Screening -  01/12/23 1130       Initial Review   Current issues with None Identified      Family Dynamics   Good Support System? Yes   husband, grandson,  sister     Barriers   Psychosocial barriers to participate in program There are no identifiable barriers or psychosocial needs.      Screening Interventions   Interventions Encouraged to exercise;To provide support and resources with identified psychosocial needs;Provide feedback about the scores to participant    Expected Outcomes Short Term goal: Utilizing psychosocial counselor, staff and physician to assist with identification of specific Stressors or current issues interfering with healing process. Setting desired goal for each stressor or current issue identified.;Long Term Goal: Stressors or current issues are controlled or eliminated.;Short Term goal: Identification and review with participant of any Quality of Life or Depression concerns found by scoring the questionnaire.;Long Term goal: The participant improves quality of Life and PHQ9 Scores as seen by post scores and/or verbalization of changes  Quality of Life Scores:   Quality of Life - 01/18/23 1107       Quality of Life   Select Quality of Life      Quality of Life Scores   Health/Function Pre 22.5 %    Socioeconomic Pre 26.88 %    Psych/Spiritual Pre 28.14 %    Family Pre 27.1 %    GLOBAL Pre 25.29 %            Scores of 19 and below usually indicate a poorer quality of life in these areas.  A difference of  2-3 points is a clinically meaningful difference.  A difference of 2-3 points in the total score of the Quality of Life Index has been associated with significant improvement in overall quality of life, self-image, physical symptoms, and general health in studies assessing change in quality of life.  PHQ-9: Review Flowsheet       04/11/2023 03/09/2023 01/18/2023 08/18/2017  Depression screen PHQ 2/9  Decreased Interest 0 0 0 0  Down, Depressed,  Hopeless 0 0 0 0  PHQ - 2 Score 0 0 0 0  Altered sleeping 3 3 3  -  Tired, decreased energy 3 3 3  -  Change in appetite 3 3 1  -  Feeling bad or failure about yourself  0 0 0 -  Trouble concentrating 0 0 0 -  Moving slowly or fidgety/restless 0 0 0 -  Suicidal thoughts 0 0 0 -  PHQ-9 Score 9 9 7  -  Difficult doing work/chores Not difficult at all Not difficult at all Somewhat difficult -   Interpretation of Total Score  Total Score Depression Severity:  1-4 = Minimal depression, 5-9 = Mild depression, 10-14 = Moderate depression, 15-19 = Moderately severe depression, 20-27 = Severe depression   Psychosocial Evaluation and Intervention:  Psychosocial Evaluation - 01/12/23 1149       Psychosocial Evaluation & Interventions   Interventions Encouraged to exercise with the program and follow exercise prescription    Comments There are no barriers to attending the program.  She does have exercise barriers.  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 Her doctor wants her to do the program.  She lives with her husband.  He helps her as needed and she has a daughter,grandson and her sister. for support. She has been dealing with multiple hospitalizations this year.   One being shoulder surgery, PT after surgery.  She developed a new tear in her shoulder and cannot have any surgery. She is ready to attend the program    Expected Outcomes STG attends all scheduled sessions, is able to see exercise progression even with her limited use of legs and 1 arm. LTG  able to continue her exercise progression.    Continue Psychosocial Services  Follow up required by staff             Psychosocial Re-Evaluation:  Psychosocial Re-Evaluation     Row Name 02/28/23 0741 03/09/23 0741 04/11/23 0739         Psychosocial Re-Evaluation   Current issues with Current Sleep Concerns Current Stress Concerns Current Stress Concerns     Comments  Jacqueline Rhodes states that she has no stress or mental health concerns at the moment, but does report that she has difficulty falling asleep and staying asleep. Reviewed patient health questionnaire (PHQ-9) with patient for follow up. Previously, patients score indicated signs/symptoms of depression.  Reviewed to see if patient is improving symptom wise while in program.  Score declined and patient states that it is because she is tired all the time and has learned to cope with it. Reviewed patient health questionnaire (PHQ-9) with patient for follow up. Previously, patients score indicated signs/symptoms of depression.  Reviewed to see if patient is improving symptom wise while in program.  Score declined and patient states that it is because she is tired all the time and has learned to cope with it.     Expected Outcomes Short: Continue to attend [facility] regularly for regular exercise and social engagement. Long: Continue to improve symptoms and manage a positive mental state. Short: Continue to work toward an improvement in PHQ9 scores by attending [facility] regularly. Long: Continue to improve stress and depression coping skills by talking with staff and attending [facility] regularly and work toward a positive mental state. Short: Continue to work toward an improvement in PHQ9 scores by attending [facility] regularly. Long: Continue to improve stress and depression coping skills by talking with staff and attending [facility] regularly and work toward a positive mental state.     Interventions Encouraged to attend Cardiac Rehabilitation for the exercise Encouraged to attend Cardiac Rehabilitation for the exercise Encouraged to attend Cardiac Rehabilitation for the exercise     Continue Psychosocial Services  Follow up required by staff Follow up required by staff Follow up required by staff              Psychosocial Discharge (Final Psychosocial Re-Evaluation):  Psychosocial  Re-Evaluation - 04/11/23 0739       Psychosocial Re-Evaluation   Current issues with Current Stress Concerns    Comments Reviewed patient health questionnaire (PHQ-9) with patient for follow up. Previously, patients score indicated signs/symptoms of depression.  Reviewed to see if patient is improving symptom wise while in program.  Score declined and patient states that it is because she is tired all the time and has learned to cope with it.    Expected Outcomes Short: Continue to work toward an improvement in PHQ9 scores by attending [facility] regularly. Long: Continue to improve stress and depression coping skills by talking with staff and attending [facility] regularly and work toward a positive mental state.    Interventions Encouraged to attend Cardiac Rehabilitation for the exercise    Continue Psychosocial Services  Follow up required by staff             Vocational Rehabilitation: Provide vocational rehab assistance to qualifying candidates.   Vocational Rehab Evaluation & Intervention:  Vocational Rehab - 01/12/23 1138       Initial Vocational Rehab Evaluation & Intervention   Assessment shows need for Vocational Rehabilitation No      Vocational Rehab Re-Evaulation   Comments retired      Discharge Vocational Rehab   Discharge Vocational Rehabilitation retired             Education: Education Goals: Education classes will be provided on a variety of topics geared toward better understanding of heart health and risk factor modification. Participant will state understanding/return demonstration of topics presented as noted by education test scores.  Learning Barriers/Preferences:   General Cardiac Education Topics:  AED/CPR: - Group verbal and written instruction with the use of models to demonstrate the basic use of the AED with the basic ABC's of resuscitation.   Anatomy and Cardiac Procedures: - Group verbal and visual presentation and models provide  information about basic cardiac anatomy and function.  Reviews the testing methods done to diagnose heart disease and the outcomes of the test results. Describes the treatment choices: Medical Management, Angioplasty, or Coronary Bypass Surgery for treating various heart conditions including Myocardial Infarction, Angina, Valve Disease, and Cardiac Arrhythmias.  Written material given at graduation. Flowsheet Row Cardiac Rehab from 04/20/2023 in [facility] Cardiac and Pulmonary Rehab  Education need identified 01/18/23  Date 02/02/23  Educator SB  Instruction Review Code 1- Verbalizes Understanding       Medication Safety: - Group verbal and visual instruction to review commonly prescribed medications for heart and lung disease. Reviews the medication, class of the drug, and side effects. Includes the steps to properly store meds and maintain the prescription regimen.  Written material given at graduation. Flowsheet Row Cardiac Rehab from 04/20/2023 in [facility] Cardiac and Pulmonary Rehab  Date 04/20/23  Educator SB  Instruction Review Code 1- Verbalizes Understanding       Intimacy: - Group verbal instruction through game format to discuss how heart and lung disease can affect sexual intimacy. Written material given at graduation.. Flowsheet Row Cardiac Rehab from 04/20/2023 in [facility] Cardiac and Pulmonary Rehab  Education need identified 01/18/23  Date 03/23/23  Educator MC  Instruction Review Code 1- Verbalizes Understanding       Know Your Numbers and Heart Failure: - Group verbal and visual instruction to discuss disease risk factors for cardiac and pulmonary disease and treatment options.  Reviews associated critical values for Overweight/Obesity, Hypertension, Cholesterol, and Diabetes.  Discusses basics of heart failure: signs/symptoms and treatments.  Introduces Heart Failure Zone chart for action plan for heart failure.  Written material given at graduation. Flowsheet Row Cardiac Rehab from  04/20/2023 in [facility] Cardiac and Pulmonary Rehab  Date 02/16/23  Educator SB  Instruction Review Code 1- Verbalizes Understanding       Infection Prevention: - Provides verbal and written material to individual with discussion of infection control including proper hand washing and proper equipment cleaning during exercise session. Flowsheet Row Cardiac Rehab from 04/20/2023 in [facility] Cardiac and Pulmonary Rehab  Date 01/18/23  Educator MB  Instruction Review Code 1- Verbalizes Understanding       Falls Prevention: - Provides verbal and written material to individual with discussion of falls prevention and safety. Flowsheet Row Cardiac Rehab from 04/20/2023 in [facility] Cardiac and Pulmonary Rehab  Date 01/18/23  Educator MB  Instruction Review Code 1- Verbalizes Understanding       Other: -Provides group and verbal instruction on various topics (see comments)   Knowledge Questionnaire Score:  Knowledge Questionnaire Score - 01/18/23 1110       Knowledge Questionnaire Score   Pre Score 18/26             Core Components/Risk Factors/Patient Goals at Admission:  Personal Goals and Risk Factors at Admission - 01/18/23 1110       Core Components/Risk Factors/Patient Goals on Admission    Weight Management Yes;Weight Loss    Intervention Weight Management: Develop a combined nutrition and exercise program designed to reach desired caloric intake, while maintaining appropriate intake of nutrient and fiber, sodium and fats, and appropriate energy expenditure required for the weight goal.;Weight Management: Provide education and appropriate resources to help participant work on and attain dietary goals.;Weight Management/Obesity: Establish reasonable short term and long term weight goals.    Admit Weight 245 lb 3.2 oz (111.2 kg)    Goal Weight: Short Term 240 lb (108.9 kg)    Goal Weight: Long Term 180 lb (  81.6 kg)    Expected Outcomes Short Term: Continue to assess and modify  interventions until short term weight is achieved;Long Term: Adherence to nutrition and physical activity/exercise program aimed toward attainment of established weight goal;Weight Loss: Understanding of general recommendations for a balanced deficit meal plan, which promotes 1-2 lb weight loss per week and includes a negative energy balance of [phone] kcal/d;Understanding recommendations for meals to include 15-35% energy as protein, 25-35% energy from fat, 35-60% energy from carbohydrates, less than 200mg  of dietary cholesterol, 20-35 gm of total fiber daily;Understanding of distribution of calorie intake throughout the day with the consumption of 4-5 meals/snacks    Hypertension Yes    Intervention Provide education on lifestyle modifcations including regular physical activity/exercise, weight management, moderate sodium restriction and increased consumption of fresh fruit, vegetables, and low fat dairy, alcohol moderation, and smoking cessation.;Monitor prescription use compliance.    Expected Outcomes Short Term: Continued assessment and intervention until BP is < 140/22mm HG in hypertensive participants. < 130/22mm HG in hypertensive participants with diabetes, heart failure or chronic kidney disease.;Long Term: Maintenance of blood pressure at goal levels.    Lipids Yes    Intervention Provide education and support for participant on nutrition & aerobic/resistive exercise along with prescribed medications to achieve LDL 70mg , HDL >40mg .    Expected Outcomes Short Term: Participant states understanding of desired cholesterol values and is compliant with medications prescribed. Participant is following exercise prescription and nutrition guidelines.;Long Term: Cholesterol controlled with medications as prescribed, with individualized exercise RX and with personalized nutrition plan. Value goals: LDL < 70mg , HDL > 40 mg.             Education:Diabetes - Individual verbal and written instruction  to review signs/symptoms of diabetes, desired ranges of glucose level fasting, after meals and with exercise. Acknowledge that pre and post exercise glucose checks will be done for 3 sessions at entry of program.   Core Components/Risk Factors/Patient Goals Review:   Goals and Risk Factor Review     Row Name 02/28/23 0748 03/09/23 0744 04/11/23 0743         Core Components/Risk Factors/Patient Goals Review   Personal Goals Review Hypertension Weight Management/Obesity;Hypertension Weight Management/Obesity;Hypertension     Review Jacqueline Rhodes states that she only checks her blood pressure when she does not feel the best. We have encouraged her to take it at least once a day on non-program days. We will continue to check in with her about her hypertension. Jacqueline Rhodes states no other questions about her rehab. She is checking her blood pressure once a day at home. Her blood pressure has been within normal limits in Rehab. She wants to try to lose more weight and reach her weight goal. Jacqueline Rhodes weighed in today at 239 lbs. She is still working on losing weight with a weight goal of 220 lbs. She continues to check her BP at home once a day which she reports can run a little high after she does household chores. She states that she had a BP reading of 160/90 after sweeping the floors at home. Her blood pressure has stayed within normal ranges in rehab.     Expected Outcomes Short: Start taking blood pressure everyday. Long: Continue to monitor hypertension Short: lose a few pounds in a couple weeks. Long: continue to work towards weight goal. Short: Continue to work towards weight goal through diet and exercise. Long: Continue to manage lifestyle risk factors.  Core Components/Risk Factors/Patient Goals at Discharge (Final Review):   Goals and Risk Factor Review - 04/11/23 0743       Core Components/Risk Factors/Patient Goals Review   Personal Goals Review Weight Management/Obesity;Hypertension     Review Jacqueline Rhodes weighed in today at 239 lbs. She is still working on losing weight with a weight goal of 220 lbs. She continues to check her BP at home once a day which she reports can run a little high after she does household chores. She states that she had a BP reading of 160/90 after sweeping the floors at home. Her blood pressure has stayed within normal ranges in rehab.    Expected Outcomes Short: Continue to work towards weight goal through diet and exercise. Long: Continue to manage lifestyle risk factors.             ITP Comments:  ITP Comments     Row Name 01/12/23 1156 01/18/23 1059 01/26/23 0819 02/01/23 0927 03/01/23 1144   ITP Comments Virtual orientation call completed today. shehas an appointment on Date: 01/18/2023  for EP eval and gym Orientation.  Documentation of diagnosis can be found in [facility] Date: 12/20/2022 . Completed and gym orientation. Initial ITP created and sent for review to Dr. Bethann Punches, Medical Director. First full day of exercise!  Patient was oriented to gym and equipment including functions, settings, policies, and procedures.  Patient's individual exercise prescription and treatment plan were reviewed.  All starting workloads were established based on the results of the 6 minute walk test done at initial orientation visit.  The plan for exercise progression was also introduced and progression will be customized based on patient's performance and goals. 30 Day review completed. Medical Director ITP review done, changes made as directed, and signed approval by Medical Director. new to program 30 Day review completed. Medical Director ITP review done, changes made as directed, and signed approval by Medical Director.    Row Name 03/29/23 1257 04/26/23 0813         ITP Comments 30 Day review completed. Medical Director ITP review done, changes made as directed, and signed approval by Medical Director. 30 Day review completed. Medical Director ITP review done,  changes made as directed, and signed approval by Medical Director.               Comments: 30 day review

## 2023-04-27 ENCOUNTER — Encounter: Payer: PPO | Admitting: *Deleted

## 2023-04-27 DIAGNOSIS — Z48812 Encounter for surgical aftercare following surgery on the circulatory system: Secondary | ICD-10-CM | POA: Diagnosis not present

## 2023-04-27 DIAGNOSIS — I213 ST elevation (STEMI) myocardial infarction of unspecified site: Secondary | ICD-10-CM

## 2023-04-27 DIAGNOSIS — Z955 Presence of coronary angioplasty implant and graft: Secondary | ICD-10-CM

## 2023-04-27 NOTE — Progress Notes (Signed)
Daily Session Note  Patient Details  Name: Jacqueline Rhodes MRN: [id number] Date of Birth: 1955/04/21 Referring Provider:   Flowsheet Row Cardiac Rehab from 01/18/2023 in [facility] Cardiac and Pulmonary Rehab  Referring Provider Tiajuana Amass, MD       Encounter Date: 04/27/2023  Check In:  Session Check In - 04/27/23 0865       Check-In   Supervising physician immediately available to respond to emergencies See telemetry face sheet for immediately available ER MD    Location [facility]-Cardiac & Pulmonary Rehab    Staff Present Maxon Conetta BS, Exercise Physiologist;Joseph Hollace Kinnier;Cora Collum, RN, BSN, CCRP    Virtual Visit No    Medication changes reported     No    Fall or balance concerns reported    No    Warm-up and Cool-down Performed on first and last piece of equipment    Resistance Training Performed Yes    VAD Patient? No    PAD/SET Patient? No      Pain Assessment   Currently in Pain? No/denies                Social History   Tobacco Use  Smoking Status Former   Current packs/day: 0.00   Average packs/day: 0.5 packs/day for 30.0 years (15.0 ttl pk-yrs)   Types: Cigarettes   Start date: 27   Quit date: 2005   Years since quitting: 20.1  Smokeless Tobacco Never    Goals Met:  Independence with exercise equipment Exercise tolerated well No report of concerns or symptoms today  Goals Unmet:  Not Applicable  Comments: Pt able to follow exercise prescription today without complaint.  Will continue to monitor for progression.    Dr. Bethann Punches is Medical Director for [facility] Cardiac Rehabilitation.  Dr. Vida Rigger is Medical Director for [facility] Pulmonary Rehabilitation.

## 2023-05-01 ENCOUNTER — Ambulatory Visit: Payer: PPO

## 2023-05-02 ENCOUNTER — Ambulatory Visit: Payer: PPO

## 2023-05-03 ENCOUNTER — Ambulatory Visit: Payer: PPO

## 2023-05-08 ENCOUNTER — Ambulatory Visit: Payer: PPO

## 2023-05-09 ENCOUNTER — Encounter: Payer: PPO | Admitting: *Deleted

## 2023-05-09 VITALS — Ht 65.5 in | Wt 233.2 lb

## 2023-05-09 DIAGNOSIS — Z48812 Encounter for surgical aftercare following surgery on the circulatory system: Secondary | ICD-10-CM | POA: Diagnosis not present

## 2023-05-09 DIAGNOSIS — Z955 Presence of coronary angioplasty implant and graft: Secondary | ICD-10-CM

## 2023-05-09 DIAGNOSIS — I213 ST elevation (STEMI) myocardial infarction of unspecified site: Secondary | ICD-10-CM

## 2023-05-09 NOTE — Patient Instructions (Addendum)
 Discharge Patient Instructions  Patient Details  Name: Jacqueline Rhodes MRN: [id number] Date of Birth: 1955-05-27 Referring Provider:  Patrice Paradise, MD   Number of Visits: 68   Reason for Discharge:  Patient reached a stable level of exercise. Patient independent in their exercise. Patient has met program and personal goals.  Smoking History:  Social History   Tobacco Use  Smoking Status Former   Current packs/day: 0.00   Average packs/day: 0.5 packs/day for 30.0 years (15.0 ttl pk-yrs)   Types: Cigarettes   Start date: 88   Quit date: 2005   Years since quitting: 20.1  Smokeless Tobacco Never    Diagnosis:  ST elevation myocardial infarction (STEMI), unspecified artery (HCC)  Status post coronary artery stent placement  Initial Exercise Prescription:  Initial Exercise Prescription - 01/18/23 1100       Date of Initial Exercise RX and Referring Provider   Date 01/18/23    Referring Provider Tiajuana Amass, MD      Oxygen   Maintain Oxygen Saturation 88% or higher      Recumbant Bike   Level 1    RPM 50    Watts 15    Minutes 15    METs 1      NuStep   Level 1    SPM 80    Minutes 15    METs 1      Biostep-RELP   Level 1    SPM 50    Minutes 15    METs 1      Track   Laps 10    Minutes 15    METs 1.54      Prescription Details   Frequency (times per week) 2    Duration Progress to 30 minutes of continuous aerobic without signs/symptoms of physical distress      Intensity   THRR 40-80% of Max Heartrate 108-136    Ratings of Perceived Exertion 11-13    Perceived Dyspnea 0-4      Progression   Progression Continue to progress workloads to maintain intensity without signs/symptoms of physical distress.      Resistance Training   Training Prescription Yes    Weight 2 lb    Reps 10-15             Discharge Exercise Prescription (Final Exercise Prescription Changes):  Exercise Prescription Changes - 05/03/23 1100        Response to Exercise   Blood Pressure (Admit) 128/72    Blood Pressure (Exit) 116/68    Heart Rate (Admit) 68 bpm    Heart Rate (Exercise) 106 bpm    Heart Rate (Exit) 85 bpm    Rating of Perceived Exertion (Exercise) 15    Symptoms none    Duration Continue with 30 min of aerobic exercise without signs/symptoms of physical distress.    Intensity THRR unchanged      Progression   Progression Continue to progress workloads to maintain intensity without signs/symptoms of physical distress.    Average METs 3.25      Resistance Training   Training Prescription Yes    Weight 3 lb    Reps 10-15      Interval Training   Interval Training No      Recumbant Bike   Level 2    Watts 15    Minutes 15    METs 2.52      NuStep   Level 3    Minutes 15    METs  4.8      Biostep-RELP   Level 1    Minutes 15    METs 3      Home Exercise Plan   Plans to continue exercise at [facility] (comment)   Plans to walk the perimeter of stores or the mall   Frequency Add 3 additional days to program exercise sessions.    Initial Home Exercises Provided 03/02/23      Oxygen   Maintain Oxygen Saturation 88% or higher             Functional Capacity:  6 Minute Walk     Row Name 01/18/23 1059 05/09/23 0806       6 Minute Walk   Phase Initial Discharge    Distance 600 feet 780 feet    Distance % Change -- 30 %    Distance Feet Change -- 180 ft    Walk Time 5.75 minutes 5.5 minutes    # of Rest Breaks 2 2    MPH 1.19 1.61    METS 1 1.61    RPE 13 15    Perceived Dyspnea  3 3    VO2 Peak 3.46 6.19    Symptoms No Yes (comment)    Comments -- shoulder pain 8/10, back pain, legs    Resting HR 80 bpm 81 bpm    Resting BP 130/64 130/70    Resting Oxygen Saturation  98 % 96 %    Exercise Oxygen Saturation  during 6 min walk 98 % 93 %    Max Ex. HR 91 bpm 125 bpm    Max Ex. BP 142/70 142/80    2 Minute Post BP 126/66 138/80            Nutrition & Weight - Outcomes:   Pre Biometrics - 01/18/23 1106       Pre Biometrics   Height 5' 5.5" (1.664 m)    Weight 245 lb 3.2 oz (111.2 kg)    Waist Circumference 44.5 inches    Hip Circumference 55 inches    Waist to Hip Ratio 0.81 %    BMI (Calculated) 40.17    Single Leg Stand 0.6 seconds             Post Biometrics - 05/09/23 0808        Post  Biometrics   Height 5' 5.5" (1.664 m)    Weight 233 lb 3.2 oz (105.8 kg)    Waist Circumference 45 inches    Hip Circumference 52.5 inches    Waist to Hip Ratio 0.86 %    BMI (Calculated) 38.2    Single Leg Stand 1 seconds

## 2023-05-09 NOTE — Progress Notes (Signed)
 Daily Session Note  Patient Details  Name: Jacqueline Rhodes MRN: [id number] Date of Birth: 06/15/1955 Referring Provider:   Flowsheet Row Cardiac Rehab from 01/18/2023 in [facility] Cardiac and Pulmonary Rehab  Referring Provider Tiajuana Amass, MD       Encounter Date: 05/09/2023  Check In:  Session Check In - 05/09/23 0815       Check-In   Supervising physician immediately available to respond to emergencies See telemetry face sheet for immediately available ER MD    Location [facility]-Cardiac & Pulmonary Rehab    Staff Present Cora Collum, RN, BSN, CCRP;Margaret Best, MS, Exercise Physiologist;Maxon Conetta BS, Exercise Physiologist    Virtual Visit No    Medication changes reported     No    Fall or balance concerns reported    No    Warm-up and Cool-down Performed on first and last piece of equipment    Resistance Training Performed Yes    VAD Patient? No    PAD/SET Patient? No      Pain Assessment   Currently in Pain? No/denies              6 Minute Walk     Row Name 01/18/23 1059 05/09/23 0806       6 Minute Walk   Phase Initial Discharge    Distance 600 feet 780 feet    Distance % Change -- 30 %    Distance Feet Change -- 180 ft    Walk Time 5.75 minutes 5.5 minutes    # of Rest Breaks 2 2    MPH 1.19 1.61    METS 1 1.61    RPE 13 15    Perceived Dyspnea  3 3    VO2 Peak 3.46 6.19    Symptoms No Yes (comment)    Comments -- shoulder pain 8/10, back pain, legs    Resting HR 80 bpm 81 bpm    Resting BP 130/64 130/70    Resting Oxygen Saturation  98 % 96 %    Exercise Oxygen Saturation  during 6 min walk 98 % 93 %    Max Ex. HR 91 bpm 125 bpm    Max Ex. BP 142/70 142/80    2 Minute Post BP 126/66 138/80                Social History   Tobacco Use  Smoking Status Former   Current packs/day: 0.00   Average packs/day: 0.5 packs/day for 30.0 years (15.0 ttl pk-yrs)   Types: Cigarettes   Start date: 45   Quit date: 2005   Years since quitting:  20.1  Smokeless Tobacco Never    Goals Met:  Independence with exercise equipment Exercise tolerated well No report of concerns or symptoms today  Goals Unmet:  Not Applicable  Comments: Pt able to follow exercise prescription today without complaint.  Will continue to monitor for progression.    Dr. Bethann Punches is Medical Director for [facility] Cardiac Rehabilitation.  Dr. Vida Rigger is Medical Director for [facility] Pulmonary Rehabilitation.

## 2023-05-10 ENCOUNTER — Ambulatory Visit: Payer: PPO

## 2023-05-10 DIAGNOSIS — H43813 Vitreous degeneration, bilateral: Secondary | ICD-10-CM | POA: Diagnosis not present

## 2023-05-10 DIAGNOSIS — Z961 Presence of intraocular lens: Secondary | ICD-10-CM | POA: Diagnosis not present

## 2023-05-10 DIAGNOSIS — E119 Type 2 diabetes mellitus without complications: Secondary | ICD-10-CM | POA: Diagnosis not present

## 2023-05-10 DIAGNOSIS — M3501 Sicca syndrome with keratoconjunctivitis: Secondary | ICD-10-CM | POA: Diagnosis not present

## 2023-05-11 ENCOUNTER — Encounter: Payer: Self-pay | Admitting: Otolaryngology

## 2023-05-11 ENCOUNTER — Encounter: Payer: PPO | Admitting: *Deleted

## 2023-05-11 ENCOUNTER — Other Ambulatory Visit: Payer: Self-pay | Admitting: Otolaryngology

## 2023-05-11 DIAGNOSIS — Z48812 Encounter for surgical aftercare following surgery on the circulatory system: Secondary | ICD-10-CM | POA: Diagnosis not present

## 2023-05-11 DIAGNOSIS — I213 ST elevation (STEMI) myocardial infarction of unspecified site: Secondary | ICD-10-CM

## 2023-05-11 DIAGNOSIS — R599 Enlarged lymph nodes, unspecified: Secondary | ICD-10-CM

## 2023-05-11 DIAGNOSIS — Z955 Presence of coronary angioplasty implant and graft: Secondary | ICD-10-CM

## 2023-05-11 NOTE — Progress Notes (Signed)
 Daily Session Note  Patient Details  Name: Jacqueline Rhodes MRN: [id number] Date of Birth: 1955-10-09 Referring Provider:   Flowsheet Row Cardiac Rehab from 01/18/2023 in [facility] Cardiac and Pulmonary Rehab  Referring Provider Tiajuana Amass, MD       Encounter Date: 05/11/2023  Check In:  Session Check In - 05/11/23 0738       Check-In   Supervising physician immediately available to respond to emergencies See telemetry face sheet for immediately available ER MD    Location [facility]-Cardiac & Pulmonary Rehab    Staff Present Susann Givens RN,BSN;Joseph Reino Kent RCP,RRT,BSRT;Noah [place], [place], Exercise Physiologist    Virtual Visit No    Medication changes reported     No    Fall or balance concerns reported    No    Warm-up and Cool-down Performed on first and last piece of equipment    Resistance Training Performed Yes    VAD Patient? No    PAD/SET Patient? No      Pain Assessment   Currently in Pain? No/denies                Social History   Tobacco Use  Smoking Status Former   Current packs/day: 0.00   Average packs/day: 0.5 packs/day for 30.0 years (15.0 ttl pk-yrs)   Types: Cigarettes   Start date: 54   Quit date: 2005   Years since quitting: 20.1  Smokeless Tobacco Never    Goals Met:  Independence with exercise equipment Exercise tolerated well No report of concerns or symptoms today Strength training completed today  Goals Unmet:  Not Applicable  Comments: Pt able to follow exercise prescription today without complaint.  Will continue to monitor for progression.    Dr. Bethann Punches is Medical Director for [facility] Cardiac Rehabilitation.  Dr. Vida Rigger is Medical Director for [facility] Pulmonary Rehabilitation.

## 2023-05-15 ENCOUNTER — Ambulatory Visit: Payer: PPO

## 2023-05-15 DIAGNOSIS — Z13228 Encounter for screening for other metabolic disorders: Secondary | ICD-10-CM | POA: Diagnosis not present

## 2023-05-15 NOTE — Progress Notes (Signed)
 Patient for US guided FNA/Core LT LN Biopsy on Tues 05/16/23, I called and spoke with the patient on the phone and gave pre-procedure instructions. Pt was made aware to be here at 12:30p and check in at the [facility] registration desk. Pt stated understanding.  Called 05/15/23

## 2023-05-15 NOTE — Progress Notes (Signed)
 Approved for attempted biopsy.  Please have orders for both FNA and core as LNs are very small and deep and may require FNA rather than core technique.  HKM

## 2023-05-16 ENCOUNTER — Encounter: Payer: PPO | Attending: Cardiovascular Disease | Admitting: *Deleted

## 2023-05-16 ENCOUNTER — Ambulatory Visit
Admission: RE | Admit: 2023-05-16 | Discharge: 2023-05-16 | Disposition: A | Discharge status: Home / Self Care | Payer: PPO | Source: Ambulatory Visit | Attending: Otolaryngology | Admitting: Otolaryngology

## 2023-05-16 DIAGNOSIS — R599 Enlarged lymph nodes, unspecified: Secondary | ICD-10-CM | POA: Insufficient documentation

## 2023-05-16 DIAGNOSIS — E890 Postprocedural hypothyroidism: Secondary | ICD-10-CM | POA: Insufficient documentation

## 2023-05-16 DIAGNOSIS — R221 Localized swelling, mass and lump, neck: Secondary | ICD-10-CM | POA: Insufficient documentation

## 2023-05-16 DIAGNOSIS — I213 ST elevation (STEMI) myocardial infarction of unspecified site: Secondary | ICD-10-CM | POA: Insufficient documentation

## 2023-05-16 DIAGNOSIS — Z955 Presence of coronary angioplasty implant and graft: Secondary | ICD-10-CM | POA: Diagnosis not present

## 2023-05-16 DIAGNOSIS — Z85850 Personal history of malignant neoplasm of thyroid: Secondary | ICD-10-CM | POA: Diagnosis not present

## 2023-05-16 DIAGNOSIS — C73 Malignant neoplasm of thyroid gland: Secondary | ICD-10-CM | POA: Diagnosis present

## 2023-05-16 MED ORDER — LIDOCAINE HCL (PF) 1 % IJ SOLN
10.0000 mL | Freq: Once | INTRAMUSCULAR | Status: AC
  Administered 2023-05-16: 10 mL via INTRADERMAL
  Filled 2023-05-16: qty 10

## 2023-05-16 NOTE — Discharge Instructions (Signed)
 Reviewed with patient

## 2023-05-16 NOTE — Progress Notes (Signed)
 Daily Session Note  Patient Details  Name: RHAELYN GIRON MRN: [id number] Date of Birth: 1955/06/13 Referring Provider:   Flowsheet Row Cardiac Rehab from 01/18/2023 in [facility] Cardiac and Pulmonary Rehab  Referring Provider Tiajuana Amass, MD       Encounter Date: 05/16/2023  Check In:  Session Check In - 05/16/23 0752       Check-In   Supervising physician immediately available to respond to emergencies See telemetry face sheet for immediately available ER MD    Location [facility]-Cardiac & Pulmonary Rehab    Staff Present Cora Collum, RN, BSN, CCRP;Noah Tickle, BS, Exercise Physiologist;Margaret Best, MS, Exercise Physiologist    Virtual Visit No    Medication changes reported     No    Fall or balance concerns reported    No    Warm-up and Cool-down Performed on first and last piece of equipment    Resistance Training Performed Yes    VAD Patient? No    PAD/SET Patient? No      Pain Assessment   Currently in Pain? No/denies                Social History   Tobacco Use  Smoking Status Former   Current packs/day: 0.00   Average packs/day: 0.5 packs/day for 30.0 years (15.0 ttl pk-yrs)   Types: Cigarettes   Start date: 71   Quit date: 2005   Years since quitting: 20.1  Smokeless Tobacco Never    Goals Met:  Independence with exercise equipment Exercise tolerated well No report of concerns or symptoms today  Goals Unmet:  Not Applicable  Comments: Pt able to follow exercise prescription today without complaint.  Will continue to monitor for progression.    Dr. Bethann Punches is Medical Director for [facility] Cardiac Rehabilitation.  Dr. Vida Rigger is Medical Director for [facility] Pulmonary Rehabilitation.

## 2023-05-17 ENCOUNTER — Ambulatory Visit: Payer: PPO

## 2023-05-17 ENCOUNTER — Ambulatory Visit
Admission: RE | Admit: 2023-05-17 | Discharge: 2023-05-17 | Disposition: A | Discharge status: Home / Self Care | Source: Ambulatory Visit | Attending: Physician Assistant | Admitting: Physician Assistant

## 2023-05-17 ENCOUNTER — Other Ambulatory Visit: Payer: Self-pay | Admitting: Physician Assistant

## 2023-05-17 DIAGNOSIS — R1084 Generalized abdominal pain: Secondary | ICD-10-CM

## 2023-05-17 DIAGNOSIS — M545 Low back pain, unspecified: Secondary | ICD-10-CM | POA: Diagnosis not present

## 2023-05-17 DIAGNOSIS — R8290 Unspecified abnormal findings in urine: Secondary | ICD-10-CM | POA: Diagnosis not present

## 2023-05-17 DIAGNOSIS — K449 Diaphragmatic hernia without obstruction or gangrene: Secondary | ICD-10-CM | POA: Diagnosis not present

## 2023-05-17 DIAGNOSIS — Z5986 Financial insecurity: Secondary | ICD-10-CM | POA: Diagnosis not present

## 2023-05-17 DIAGNOSIS — Z79899 Other long term (current) drug therapy: Secondary | ICD-10-CM | POA: Diagnosis not present

## 2023-05-17 DIAGNOSIS — M159 Polyosteoarthritis, unspecified: Secondary | ICD-10-CM | POA: Diagnosis not present

## 2023-05-17 DIAGNOSIS — L4050 Arthropathic psoriasis, unspecified: Secondary | ICD-10-CM | POA: Diagnosis not present

## 2023-05-17 DIAGNOSIS — K5730 Diverticulosis of large intestine without perforation or abscess without bleeding: Secondary | ICD-10-CM | POA: Diagnosis not present

## 2023-05-17 DIAGNOSIS — R109 Unspecified abdominal pain: Secondary | ICD-10-CM | POA: Diagnosis not present

## 2023-05-18 DIAGNOSIS — R8290 Unspecified abnormal findings in urine: Secondary | ICD-10-CM | POA: Diagnosis not present

## 2023-05-18 LAB — CYTOLOGY - NON PAP

## 2023-05-22 ENCOUNTER — Ambulatory Visit: Payer: PPO

## 2023-05-24 DIAGNOSIS — Z955 Presence of coronary angioplasty implant and graft: Secondary | ICD-10-CM

## 2023-05-24 DIAGNOSIS — I213 ST elevation (STEMI) myocardial infarction of unspecified site: Secondary | ICD-10-CM

## 2023-05-24 NOTE — Progress Notes (Signed)
 Cardiac Individual Treatment Plan  Patient Details  Name: Jacqueline Rhodes MRN: [id number] Date of Birth: 1956/03/12 Referring Provider:   Flowsheet Row Cardiac Rehab from 01/18/2023 in [facility] Cardiac and Pulmonary Rehab  Referring Provider Tiajuana Amass, MD       Initial Encounter Date:  Flowsheet Row Cardiac Rehab from 01/18/2023 in [facility] Cardiac and Pulmonary Rehab  Date 01/18/23       Visit Diagnosis: ST elevation myocardial infarction (STEMI), unspecified artery [facility])  Status post coronary artery stent placement  Patient's Home Medications on Admission:  Current Outpatient Medications:    acetaminophen (TYLENOL) 500 MG tablet, Take 2 tablets (1,000 mg total) by mouth every 8 (eight) hours. (Patient taking differently: Take 1,000 mg by mouth every 8 (eight) hours as needed for mild pain (pain score 1-3), moderate pain (pain score 4-6), fever or headache.), Disp: 90 tablet, Rfl: 2   albuterol (PROVENTIL HFA;VENTOLIN HFA) 108 (90 Base) MCG/ACT inhaler, Inhale 2 puffs into the lungs every 6 (six) hours as needed for wheezing or shortness of breath. , Disp: , Rfl:    aspirin 81 MG chewable tablet, Chew 1 tablet (81 mg total) by mouth daily., Disp: 30 tablet, Rfl: 0   Calcium Fructoborate POWD, 20 mcg by Does not apply route. Cream, Disp: , Rfl:    Cetirizine HCl 10 MG CAPS, Take 10 mg by mouth daily. , Disp: , Rfl:    Cholecalciferol 25 MCG (1000 UT) TBDP, Take 125 mcg by mouth., Disp: , Rfl:    Coenzyme Q10 (COQ-10) 100 MG CAPS, Take 100 mg by mouth daily. , Disp: , Rfl:    ezetimibe (ZETIA) 10 MG tablet, Take 1 tablet (10 mg total) by mouth daily., Disp: 30 tablet, Rfl: 0   fluticasone furoate-vilanterol (BREO ELLIPTA) 100-25 MCG/INH AEPB, Inhale 1 puff into the lungs daily. , Disp: , Rfl:    folic acid (FOLVITE) 1 MG tablet, Take 1 mg by mouth daily., Disp: , Rfl:    furosemide (LASIX) 20 MG tablet, Take 20 mg by mouth daily as needed for fluid or edema., Disp: , Rfl:     isosorbide mononitrate (IMDUR) 60 MG 24 hr tablet, Take 2 tablets (120 mg total) by mouth daily., Disp: 60 tablet, Rfl: 0   levothyroxine (SYNTHROID) 150 MCG tablet, Take 150 mcg by mouth daily before breakfast., Disp: , Rfl:    meclizine (ANTIVERT) 25 MG tablet, Take 25 mg by mouth 3 (three) times daily as needed for dizziness., Disp: , Rfl:    methotrexate (RHEUMATREX) 2.5 MG tablet, Take 20 mg by mouth every Thursday. , Disp: , Rfl:    metoprolol succinate (TOPROL-XL) 25 MG 24 hr tablet, Take 1 tablet (25 mg total) by mouth 2 (two) times daily., Disp: 60 tablet, Rfl: 0   nitroGLYCERIN (NITROSTAT) 0.4 MG SL tablet, Place 1 tablet (0.4 mg total) under the tongue once every 5 (five) minutes as needed for chest pain. (Max of 3 doses in 15 minutes)., Disp: 25 tablet, Rfl: 0   pantoprazole (PROTONIX) 40 MG tablet, Take 40 mg by mouth 2 (two) times daily., Disp: , Rfl:    pravastatin (PRAVACHOL) 20 MG tablet, Take 1 tablet (20 mg total) by mouth daily., Disp: 30 tablet, Rfl: 0   Secukinumab (COSENTYX SENSOREADY 300 DOSE) 150 MG/ML SOAJ, Inject 300 mg into the skin every 28 (twenty-eight) days.  (Patient not taking: Reported on 01/12/2023), Disp: , Rfl:    spironolactone (ALDACTONE) 25 MG tablet, Take 0.5 tablet (12.5 mg total) by mouth  daily., Disp: 15 tablet, Rfl: 0   ticagrelor (BRILINTA) 90 MG TABS tablet, Take 1 tablet (90 mg total) by mouth 2 (two) times daily., Disp: 60 tablet, Rfl: 0   UNABLE TO FIND, Take 1.5 drops by mouth daily at 2 PM. Med Name: B12  5,000 unit, B6 3400 unit B1  and Niacin, Disp: , Rfl:   Past Medical History: Past Medical History:  Diagnosis Date   Anemia    Anginal pain (HCC)    Aortic atherosclerosis (HCC)    Asthma    B12 deficiency    Carpal tunnel syndrome    CHF (congestive heart failure) (HCC)    Chronic venous insufficiency    Complete tear of right rotator cuff    COPD (chronic obstructive pulmonary disease) (HCC)    Coronary artery disease 01/07/2005    a.) LHC 01/07/2005 (NSTEMI): LHC 60% pLAD, 70% D1, 30% pLCx, 20% pRCA, 25% dRCA - med mgmt; b.) LHC ([place]) 01/10/2008: 25/60% pLAD, 70% D1, 20% pLCx, 20% mRCA --> FFR normal --> med mgmt; c.) LHC ([place]) 01/04/2012:: 40/60% pLAD, 70% D1, 25% pLCx, 20% pRCA, 20% mRCA, 25% dRCA --> FFR normal --> med mgmt; d.) LHC ([place]) 01/10/2018: 20% pRCA, 50% pLAD, 55% oD1 -- med mgmt   Diverticulosis    DOE (dyspnea on exertion)    GERD (gastroesophageal reflux disease)    History of bilateral cataract extraction 2014   Hyperlipidemia    Hypertension    Hypothyroidism    a.) s/p LEFT hemithyroidectomy; on levothyroxine   Long term current use of aspirin    Long term current use of immunosuppressive drug    a.) MTX + secukinumab for RA/psoriatric arthritis diagnoses   Lower extremity edema    Lymphedema    NSTEMI (non-ST elevated myocardial infarction) (HCC) 01/06/2005   a.) LHC 01/07/2005: 60% pLAD, 70% D1, 30% pLCx, 20% pRCA, 25% dRCA -- med mgmt   OSA on CPAP    a.) compliance issues with prescribed nocturnal PAP therapy   Osteoarthritis    Peripheral vascular disease (HCC)    Post-menopausal osteoporosis    Psoriatic arthritis (HCC)    RA (rheumatoid arthritis) (HCC)    Renal insufficiency    T2DM (type 2 diabetes mellitus) (HCC)    Thyroid cancer (HCC)    a.) s/p LEFT hemithyroidectomy 08/2015    Tobacco Use: Social History   Tobacco Use  Smoking Status Former   Current packs/day: 0.00   Average packs/day: 0.5 packs/day for 30.0 years (15.0 ttl pk-yrs)   Types: Cigarettes   Start date: 16   Quit date: 2005   Years since quitting: 20.2  Smokeless Tobacco Never    Labs: Review Flowsheet       Latest Ref Rng & Units 11/11/2022 12/21/2022  Labs for ITP Cardiac and Pulmonary Rehab  Cholestrol 0 - 200 mg/dL - 161   LDL (calc) 0 - 99 mg/dL - 096   HDL-C >04 mg/dL - 50   Trlycerides <540 mg/dL - 60   Hemoglobin J8J 4.8 - 5.6 % 6.3  -     Exercise Target Goals: Exercise Program  Goal: Individual exercise prescription set using results from initial 6 min walk test and THRR while considering  patient's activity barriers and safety.   Exercise Prescription Goal: Initial exercise prescription builds to 30-45 minutes a day of aerobic activity, 2-3 days per week.  Home exercise guidelines will be given to patient during program as part of exercise prescription that the participant will acknowledge.  Education: Aerobic Exercise: - Group verbal and visual presentation on the components of exercise prescription. Introduces F.I.T.T principle from ACSM for exercise prescriptions.  Reviews F.I.T.T. principles of aerobic exercise including progression. Written material given at graduation. Flowsheet Row Cardiac Rehab from 04/20/2023 in [facility] Cardiac and Pulmonary Rehab  Education need identified 01/18/23  Date 03/23/23  Educator MC  Instruction Review Code 1- [employer] Understanding       Education: Resistance Exercise: - Group verbal and visual presentation on the components of exercise prescription. Introduces F.I.T.T principle from ACSM for exercise prescriptions  Reviews F.I.T.T. principles of resistance exercise including progression. Written material given at graduation. Flowsheet Row Cardiac Rehab from 04/20/2023 in [facility] Cardiac and Pulmonary Rehab  Date 03/16/23  Educator MB  Instruction Review Code 1- [employer] Understanding        Education: Exercise & Equipment Safety: - Individual verbal instruction and demonstration of equipment use and safety with use of the equipment. Flowsheet Row Cardiac Rehab from 04/20/2023 in [facility] Cardiac and Pulmonary Rehab  Date 01/18/23  Educator MB  Instruction Review Code 1- Verbalizes Understanding       Education: Exercise Physiology & General Exercise Guidelines: - Group verbal and written instruction with models to review the exercise physiology of the cardiovascular system and associated critical values. Provides general  exercise guidelines with specific guidelines to those with heart or lung disease.  Flowsheet Row Cardiac Rehab from 04/20/2023 in [facility] Cardiac and Pulmonary Rehab  Education need identified 01/18/23  Date 03/09/23  Educator MC  Instruction Review Code 1- [employer] Understanding       Education: Flexibility, Balance, Mind/Body Relaxation: - Group verbal and visual presentation with interactive activity on the components of exercise prescription. Introduces F.I.T.T principle from ACSM for exercise prescriptions. Reviews F.I.T.T. principles of flexibility and balance exercise training including progression. Also discusses the mind body connection.  Reviews various relaxation techniques to help reduce and manage stress (i.e. Deep breathing, progressive muscle relaxation, and visualization). Balance handout provided to take home. Written material given at graduation. Flowsheet Row Cardiac Rehab from 04/20/2023 in [facility] Cardiac and Pulmonary Rehab  Date 03/16/23  Educator MB  Instruction Review Code 1- Verbalizes Understanding       Activity Barriers & Risk Stratification:  Activity Barriers & Cardiac Risk Stratification - 01/18/23 1102       Activity Barriers & Cardiac Risk Stratification   Activity Barriers Joint Problems;Other (comment);Arthritis;Right Knee Replacement    Comments nerve damage knee down R nerve damage to foot to toes. left foot, Right Shoulder tear    Cardiac Risk Stratification High             6 Minute Walk:  6 Minute Walk     Row Name 01/18/23 1059 05/09/23 0806       6 Minute Walk   Phase Initial Discharge    Distance 600 feet 780 feet    Distance % Change -- 30 %    Distance Feet Change -- 180 ft    Walk Time 5.75 minutes 5.5 minutes    # of Rest Breaks 2 2    MPH 1.19 1.61    METS 1 1.61    RPE 13 15    Perceived Dyspnea  3 3    VO2 Peak 3.46 6.19    Symptoms No Yes (comment)    Comments -- shoulder pain 8/10, back pain, legs    Resting HR 80 bpm  81 bpm    Resting BP 130/64 130/70  Resting Oxygen Saturation  98 % 96 %    Exercise Oxygen Saturation  during 6 min walk 98 % 93 %    Max Ex. HR 91 bpm 125 bpm    Max Ex. BP 142/70 142/80    2 Minute Post BP 126/66 138/80             Oxygen Initial Assessment:   Oxygen Re-Evaluation:   Oxygen Discharge (Final Oxygen Re-Evaluation):   Initial Exercise Prescription:  Initial Exercise Prescription - 01/18/23 1100       Date of Initial Exercise RX and Referring Provider   Date 01/18/23    Referring Provider Tiajuana Amass, MD      Oxygen   Maintain Oxygen Saturation 88% or higher      Recumbant Bike   Level 1    RPM 50    Watts 15    Minutes 15    METs 1      NuStep   Level 1    SPM 80    Minutes 15    METs 1      Biostep-RELP   Level 1    SPM 50    Minutes 15    METs 1      Track   Laps 10    Minutes 15    METs 1.54      Prescription Details   Frequency (times per week) 2    Duration Progress to 30 minutes of continuous aerobic without signs/symptoms of physical distress      Intensity   THRR 40-80% of Max Heartrate 108-136    Ratings of Perceived Exertion 11-13    Perceived Dyspnea 0-4      Progression   Progression Continue to progress workloads to maintain intensity without signs/symptoms of physical distress.      Resistance Training   Training Prescription Yes    Weight 2 lb    Reps 10-15             Perform Capillary Blood Glucose checks as needed.  Exercise Prescription Changes:   Exercise Prescription Changes     Row Name 01/18/23 1100 02/07/23 1400 02/23/23 1500 03/02/23 0700 03/09/23 0900     Response to Exercise   Blood Pressure (Admit) 130/64 106/60 132/78 -- 132/60   Blood Pressure (Exercise) 142/70 158/70 -- -- 164/64   Blood Pressure (Exit) 126/66 110/60 112/60 -- 122/66   Heart Rate (Admit) 90 bpm 58 bpm 57 bpm -- 60 bpm   Heart Rate (Exercise) 91 bpm 126 bpm 100 bpm -- 108 bpm   Heart Rate (Exit) 80 bpm  86 bpm 81 bpm -- 66 bpm   Oxygen Saturation (Admit) 98 % -- -- -- --   Oxygen Saturation (Exercise) 98 % -- -- -- --   Oxygen Saturation (Exit) 97 % -- -- -- --   Rating of Perceived Exertion (Exercise) 13 -- 16 -- 16   Perceived Dyspnea (Exercise) 3 -- -- -- --   Symptoms none none none -- none   Comments results First 3 days of full exercise -- -- --   Duration Progress to 30 minutes of  aerobic without signs/symptoms of physical distress Continue with 30 min of aerobic exercise without signs/symptoms of physical distress. Continue with 30 min of aerobic exercise without signs/symptoms of physical distress. -- Continue with 30 min of aerobic exercise without signs/symptoms of physical distress.   Intensity THRR New THRR unchanged THRR unchanged -- THRR unchanged  Progression   Progression Continue to progress workloads to maintain intensity without signs/symptoms of physical distress. Continue to progress workloads to maintain intensity without signs/symptoms of physical distress. Continue to progress workloads to maintain intensity without signs/symptoms of physical distress. -- Continue to progress workloads to maintain intensity without signs/symptoms of physical distress.   Average METs 1 2.43 2.69 -- 3.07     Resistance Training   Training Prescription -- Yes Yes -- Yes   Weight -- 2 lb 3 lb -- 3 lb   Reps -- 10-15 10-15 -- 10-15     Interval Training   Interval Training -- No No -- No     Recumbant Bike   Level -- 3 1 -- 1   Watts -- 15 16 -- 15   Minutes -- 15 15 -- 15   METs -- 2.42 2.46 -- 2.43     NuStep   Level -- 2 2 -- 2   Minutes -- 15 15 -- 15   METs -- 3 3.2 -- 4.2     Biostep-RELP   Level -- 1 1 -- 1   Minutes -- 15 15 -- 15   METs -- -- 3 -- 3     Home Exercise Plan   Plans to continue exercise at -- -- -- [employer] (comment)  Plans to walk the perimeter of stores or the [employer] (comment)  Plans to walk the perimeter of  stores or the mall   Frequency -- -- -- Add 3 additional days to program exercise sessions. Add 3 additional days to program exercise sessions.   Initial Home Exercises Provided -- -- -- 03/02/23 03/02/23     Oxygen   Maintain Oxygen Saturation -- 88% or higher 88% or higher 88% or higher 88% or higher    Row Name 03/23/23 1600 04/06/23 1100 04/20/23 0800 05/03/23 1100 05/18/23 0800     Response to Exercise   Blood Pressure (Admit) 144/80 118/68 138/74 128/72 134/68   Blood Pressure (Exit) 118/62 108/62 112/66 116/68 112/60   Heart Rate (Admit) 84 bpm 105 bpm 96 bpm 68 bpm 90 bpm   Heart Rate (Exercise) 112 bpm 96 bpm 120 bpm 106 bpm 113 bpm   Heart Rate (Exit) 68 bpm 130 bpm 78 bpm 85 bpm 97 bpm   Rating of Perceived Exertion (Exercise) 13 15 15 15 13    Perceived Dyspnea (Exercise) -- 2 -- -- --   Symptoms knee/leg pain none none none none   Duration Continue with 30 min of aerobic exercise without signs/symptoms of physical distress. Continue with 30 min of aerobic exercise without signs/symptoms of physical distress. Continue with 30 min of aerobic exercise without signs/symptoms of physical distress. Continue with 30 min of aerobic exercise without signs/symptoms of physical distress. Continue with 30 min of aerobic exercise without signs/symptoms of physical distress.   Intensity THRR unchanged THRR unchanged THRR unchanged THRR unchanged THRR unchanged     Progression   Progression Continue to progress workloads to maintain intensity without signs/symptoms of physical distress. Continue to progress workloads to maintain intensity without signs/symptoms of physical distress. Continue to progress workloads to maintain intensity without signs/symptoms of physical distress. Continue to progress workloads to maintain intensity without signs/symptoms of physical distress. Continue to progress workloads to maintain intensity without signs/symptoms of physical distress.   Average METs 3.83 3.28  3.03 3.25 4.17     Resistance Training   Training Prescription Yes Yes Yes Yes Yes   Weight 3  lb 3 lb 3 lb 3 lb 3 lb   Reps 10-15 10-15 10-15 10-15 10-15     Interval Training   Interval Training No No No No No     Recumbant Bike   Level -- 1 1 2  --   Watts -- 18 18 15  --   Minutes -- 21 15 15  --   METs -- 2.52 2.2 2.52 --     NuStep   Level 3 3 3 3 3    Minutes 15 15 15 15 15    METs 4.6 4.1 4.2 4.8 4.8     Biostep-RELP   Level 1 1 2 1 1    Minutes 15 15 15 15 15    METs 3 3 2 3 3      Home Exercise Plan   Plans to continue exercise at [employer] (comment)  Plans to walk the perimeter of stores or the mall [employer] (comment)  Plans to walk the perimeter of stores or the mall [employer] (comment)  Plans to walk the perimeter of stores or the mall [employer] (comment)  Plans to walk the perimeter of stores or the mall [employer] (comment)  Plans to walk the perimeter of stores or the mall   Frequency Add 3 additional days to program exercise sessions. Add 3 additional days to program exercise sessions. Add 3 additional days to program exercise sessions. Add 3 additional days to program exercise sessions. Add 3 additional days to program exercise sessions.   Initial Home Exercises Provided 03/02/23 03/02/23 03/02/23 03/02/23 03/02/23     Oxygen   Maintain Oxygen Saturation 88% or higher 88% or higher 88% or higher 88% or higher 88% or higher            Exercise Comments:   Exercise Comments     Row Name 01/12/23 1157 01/26/23 0820         Exercise Comments Both of her legs have nerve damage and it is hard for her to walk long distances, She cannot use the treadmill. Her doctor wants her to do the program. One being shoulder surgery, PT after surgery. She developed a new tear in her shoulder and cannot have any surgery. First full day of exercise!  Patient was oriented to gym and equipment including functions, settings, policies, and  procedures.  Patient's individual exercise prescription and treatment plan were reviewed.  All starting workloads were established based on the results of the 6 minute walk test done at initial orientation visit.  The plan for exercise progression was also introduced and progression will be customized based on patient's performance and goals.               Exercise Goals and Review:   Exercise Goals     Row Name 01/18/23 1106             Exercise Goals   Increase Physical Activity Yes       Intervention Provide advice, education, support and counseling about physical activity/exercise needs.;Develop an individualized exercise prescription for aerobic and resistive training based on initial evaluation findings, risk stratification, comorbidities and participant's personal goals.       Expected Outcomes Short Term: Attend rehab on a regular basis to increase amount of physical activity.;Long Term: Add in home exercise to make exercise part of routine and to increase amount of physical activity.;Long Term: Exercising regularly at least 3-5 days a week.       Increase Strength and Stamina Yes  Intervention Provide advice, education, support and counseling about physical activity/exercise needs.;Develop an individualized exercise prescription for aerobic and resistive training based on initial evaluation findings, risk stratification, comorbidities and participant's personal goals.       Expected Outcomes Short Term: Increase workloads from initial exercise prescription for resistance, speed, and METs.;Short Term: Perform resistance training exercises routinely during rehab and add in resistance training at home;Long Term: Improve cardiorespiratory fitness, muscular endurance and strength as measured by increased METs and functional capacity ( )       Able to understand and use rate of perceived exertion (RPE) scale Yes       Intervention Provide education and explanation on how to use  RPE scale       Expected Outcomes Short Term: Able to use RPE daily in rehab to express subjective intensity level;Long Term:  Able to use RPE to guide intensity level when exercising independently       Able to understand and use Dyspnea scale Yes       Intervention Provide education and explanation on how to use Dyspnea scale       Expected Outcomes Short Term: Able to use Dyspnea scale daily in rehab to express subjective sense of shortness of breath during exertion;Long Term: Able to use Dyspnea scale to guide intensity level when exercising independently       Knowledge and understanding of Target Heart Rate Range (THRR) Yes       Intervention Provide education and explanation of THRR including how the numbers were predicted and where they are located for reference       Expected Outcomes Short Term: Able to state/look up THRR;Short Term: Able to use daily as guideline for intensity in rehab;Long Term: Able to use THRR to govern intensity when exercising independently       Able to check pulse independently Yes       Intervention Provide education and demonstration on how to check pulse in carotid and radial arteries.;Review the importance of being able to check your own pulse for safety during independent exercise       Expected Outcomes Short Term: Able to explain why pulse checking is important during independent exercise;Long Term: Able to check pulse independently and accurately       Understanding of Exercise Prescription Yes       Intervention Provide education, explanation, and written materials on patient's individual exercise prescription       Expected Outcomes Short Term: Able to explain program exercise prescription;Long Term: Able to explain home exercise prescription to exercise independently                Exercise Goals Re-Evaluation :  Exercise Goals Re-Evaluation     Row Name 01/26/23 0820 02/07/23 1447 02/23/23 1549 02/28/23 0739 03/02/23 0756     Exercise Goal  Re-Evaluation   Exercise Goals Review Able to understand and use rate of perceived exertion (RPE) scale;Able to understand and use Dyspnea scale;Knowledge and understanding of Target Heart Rate Range (THRR);Understanding of Exercise Prescription Increase Physical Activity;Increase Strength and Stamina;Understanding of Exercise Prescription Increase Physical Activity;Increase Strength and Stamina;Understanding of Exercise Prescription Increase Physical Activity;Knowledge and understanding of Target Heart Rate Range (THRR);Able to check pulse independently Increase Physical Activity;Knowledge and understanding of Target Heart Rate Range (THRR);Able to check pulse independently;Able to understand and use Dyspnea scale;Increase Strength and Stamina;Able to understand and use rate of perceived exertion (RPE) scale;Understanding of Exercise Prescription   Comments Reviewed RPE and dyspnea scale, THR and program  prescription with pt today.  Pt voiced understanding and was given a copy of goals to take home. Pheonix is off to a good start in the program. She did well as she increased to level 3 on the recumbent bike and level 2 on the T4 nustep. She did not do any walking during her first 3 sessions of rehab. We will continue to monitor her progress in the program. Cristle is doing well in the program. She has continued to work at level 2 on the T4 nustep and level 1 on the biostep. She also decreased her recumbent bike workload back down to level 1 after previously working at level 3. She has not done any walking during the program at this time. She did increase from 2 lb to 3 lb hand weights. We will continue to monitor her progress in the program. Polina continues to do well in the program, but is awaiting a home exercise prescription. We will continue to monitor her progress in the program and prescribe home exercise when appropriate. Reviewed home exercise with pt today.  Pt plans to walk the perimeter of stores and  the mall for exercise.  Reviewed THR, pulse, RPE, sign and symptoms, pulse oximetery and when to call 911 or MD.  Also discussed weather considerations and indoor options.  Pt voiced understanding.   Expected Outcomes Short: Use RPE daily to regulate intensity. Long: Follow program prescription in THR. Short: Continue to follow current exercise prescription. Long: Continue exercise to improve strength and stamina. Short: Increase back up to level 3 on the recumbent bike, begin walking the track. Long: Continue exercise to improve strength and stamina. Short: Meet with an EP to discuss home exercise plans. Long: Continue exercise and eventually start home exercise regiment. Short: Begin walking on days away from rehab. Long: Continue exercise to improve strength and stamina.    Row Name 03/09/23 0945 03/23/23 1623 04/06/23 1120 04/11/23 0735 04/20/23 0820     Exercise Goal Re-Evaluation   Exercise Goals Review Increase Physical Activity;Increase Strength and Stamina;Understanding of Exercise Prescription Increase Physical Activity;Increase Strength and Stamina;Understanding of Exercise Prescription Increase Physical Activity;Increase Strength and Stamina;Understanding of Exercise Prescription Increase Physical Activity;Increase Strength and Stamina;Understanding of Exercise Prescription Increase Physical Activity;Increase Strength and Stamina;Understanding of Exercise Prescription   Comments Rokhaya is doing great in rehab. She hasnt been able to increase her levels, but has been able to maintain her workloads on the T4 nustep, biostep and recumbent bike. We will continue to encourage and monitor her progression in the program. Adria continues to do well in rehab. She recently increased from level 2 to level 3 on the T4 nustep. She also has done well on the biostep and continues to work at level 1. We will continue to monitor her progress in the program. Lummie continues to do well in rehab. She has maintained  level 3 on the T4 nustep. She continues to work at level 1 on the biostep and recumbent bike. We will continue to monitor her progress in the program. Effie continues to do well with exercise. She has been progressively increasing workloads in rehab and is steadily improving. Walking around at stores when she is not in rehab. She reports that she walks around most of the store when she goes in order to get her steps in. We will continue to monitor her progress in the program. Magan has only attended two sessions since the last review. She has continued to work at level 3 on the T4  nustep and level 1 on the recumbent bike. She did improve to level 2 on the biostep as well. We will continue to monitor her progress in the program.   Expected Outcomes Short: Increase to level 2 on the recumbent bike and biostep. Long: Continue exercise to improve strength and stamina. Short: Increase to level 2 on the biostep. Long: Continue exercise to improve strength and stamina. Short: Increase to level 2 on the biostep and recumbent bike. Long: Continue exercise to improve strength and stamina. Short: Continue to walk on days away from rehab. Long: Continue exercise to improve strength and stamina. Short: Attend rehab more consistently. Long: Continue exercise to improve strength and stamina.    Row Name 05/03/23 1141 05/16/23 0754 05/18/23 0849         Exercise Goal Re-Evaluation   Exercise Goals Review Increase Physical Activity;Increase Strength and Stamina;Understanding of Exercise Prescription Increase Physical Activity;Increase Strength and Stamina;Understanding of Exercise Prescription Increase Physical Activity;Increase Strength and Stamina;Understanding of Exercise Prescription     Comments Loreley is doing well in the program. She is due for her post and will look to improve on it. She also improved to level 2 on the recumbent bike and continued to work at level 1 on the biostep and level 3 on the T4  nustep. We will continue to monitor her progress in the program. Massie is close to graduating from the program. She feels that she has done well with the exercise in rehab. She states that the main benefits she has noticed from the program have been gained strength in her legs and overall increase in stamina. She is looking into joining the [facility] as she has silver sneakers. She plans to exercise there twice a week to stick with the routine she started in cardiac rehab. Ronniesha is doing well and is close to graduating from the program. She recently completed her post and improved by 30%! She also continues to work at level 1 on the biostep and level 3 on the T4 nustep. We will continue to monitor her progress until she graduates from the program.     Expected Outcomes Short: Improve on post . Long: Continue exercise to improve strength and stamina. Short: Graduate. Long: Continue to exercise independently. Short: Graduate. Long: Continue to exercise independently.              Discharge Exercise Prescription (Final Exercise Prescription Changes):  Exercise Prescription Changes - 05/18/23 0800       Response to Exercise   Blood Pressure (Admit) 134/68    Blood Pressure (Exit) 112/60    Heart Rate (Admit) 90 bpm    Heart Rate (Exercise) 113 bpm    Heart Rate (Exit) 97 bpm    Rating of Perceived Exertion (Exercise) 13    Symptoms none    Duration Continue with 30 min of aerobic exercise without signs/symptoms of physical distress.    Intensity THRR unchanged      Progression   Progression Continue to progress workloads to maintain intensity without signs/symptoms of physical distress.    Average METs 4.17      Resistance Training   Training Prescription Yes    Weight 3 lb    Reps 10-15      Interval Training   Interval Training No      NuStep   Level 3    Minutes 15    METs 4.8      Biostep-RELP   Level 1    Minutes  15    METs 3      Home Exercise Plan   Plans  to continue exercise at [facility] (comment)   Plans to walk the perimeter of stores or the mall   Frequency Add 3 additional days to program exercise sessions.    Initial Home Exercises Provided 03/02/23      Oxygen   Maintain Oxygen Saturation 88% or higher             Nutrition:  Target Goals: Understanding of nutrition guidelines, daily intake of sodium 1500mg , cholesterol 200mg , calories 30% from fat and 7% or less from saturated fats, daily to have 5 or more servings of fruits and vegetables.  Education: All About Nutrition: -Group instruction provided by verbal, written material, interactive activities, discussions, models, and posters to present general guidelines for heart healthy nutrition including fat, fiber, MyPlate, the role of sodium in heart healthy nutrition, utilization of the nutrition label, and utilization of this knowledge for meal planning. Follow up email sent as well. Written material given at graduation. Flowsheet Row Cardiac Rehab from [facility]/20/2023 in [facility] Cardiac and Pul[facility] Rehab  Education need identified 01/18/23  Date 01/26/23  Educator JG  Instruction Review Code 1- Verbalizes Understanding       Biometrics:  Pre Biometrics - 01/18/23 1106       Pre Biometrics   Height 5' 5.5" (1.664 m)    Weight 245 lb 3.2 oz (111.2 kg)    Waist Circumference 44.5 inches    Hip Circumference 55 inches    Waist to Hip Ratio 0.81 %    BMI (Calculated) 40.17    Single Leg Stand 0.6 seconds             Post Biometrics - 05/09/23 8295        Post  Biometrics   Height 5' 5.5" (1.664 m)    Weight 233 lb 3.2 oz (105.8 kg)    Waist Circumference 45 inches    Hip Circumference 52.5 inches    Waist to Hip Ratio 0.86 %    BMI (Calculated) 38.2    Single Leg Stand 1 seconds             Nutrition Therapy Plan and Nutrition Goals:  Nutrition Therapy & Goals - 01/18/23 1108       Nutrition Therapy   RD appointment deferred Yes       Personal Nutrition Goals   Nutrition Goal Patient deferred RD appointment      Intervention Plan   Intervention Prescribe, educate and counsel regarding individualized specific dietary modifications aiming towards targeted core components such as weight, hypertension, lipid management, diabetes, heart failure and other comorbidities.;Nutrition handout(s) given to patient.    Expected Outcomes Short Term Goal: Understand basic principles of dietary content, such as calories, fat, sodium, cholesterol and nutrients.;Short Term Goal: A plan has been developed with personal nutrition goals set during dietitian appointment.;Long Term Goal: Adherence to prescribed nutrition plan.             Nutrition Assessments:  MEDIFICTS Score Key: >=70 Need to make dietary changes  40-70 Heart Healthy Diet <= 40 Therapeutic Level Cholesterol Diet  Flowsheet Row Cardiac Rehab from 05/11/2023 in [facility] Cardiac and Pulmonary Rehab  Picture Your Plate Total Score on Discharge 58      Picture Your Plate Scores: <62 Unhealthy dietary pattern with much room for improvement. 41-50 Dietary pattern unlikely to meet recommendations for good health and room for improvement. 51-60 More healthful  dietary pattern, with some room for improvement.  >60 Healthy dietary pattern, although there may be some specific behaviors that could be improved.    Nutrition Goals Re-Evaluation:  Nutrition Goals Re-Evaluation     Row Name 02/28/23 [phone] 03/09/23 0739 04/11/23 0742 05/16/23 0758       Goals   Nutrition Goal Patient deferred RD appointment Patient deferred RD appointment Patient deferred RD appointment Patient deferred RD appointment             Nutrition Goals Discharge (Final Nutrition Goals Re-Evaluation):  Nutrition Goals Re-Evaluation - 05/16/23 0758       Goals   Nutrition Goal Patient deferred RD appointment             Psychosocial: Target Goals: Acknowledge presence or absence of  significant depression and/or stress, maximize coping skills, provide positive support system. Participant is able to verbalize types and ability to use techniques and skills needed for reducing stress and depression.   Education: Stress, Anxiety, and Depression - Group verbal and visual presentation to define topics covered.  Reviews how body is impacted by stress, anxiety, and depression.  Also discusses healthy ways to reduce stress and to treat/manage anxiety and depression.  Written material given at graduation. Flowsheet Row Cardiac Rehab from 04/20/2023 in [facility] Cardiac and Pulmonary Rehab  Education need identified 01/18/23  Date 03/02/23  Educator SB  Instruction Review Code 1- [employer] Understanding       Education: Sleep Hygiene -Provides group verbal and written instruction about how sleep can affect your health.  Define sleep hygiene, discuss sleep cycles and impact of sleep habits. Review good sleep hygiene tips.    Initial Review & Psychosocial Screening:  Initial Psych Review & Screening - 01/12/23 1130       Initial Review   Current issues with None Identified      Family Dynamics   Good Support System? Yes   husband, grandson,  sister     Barriers   Psychosocial barriers to participate in program There are no identifiable barriers or psychosocial needs.      Screening Interventions   Interventions Encouraged to exercise;To provide support and resources with identified psychosocial needs;Provide feedback about the scores to participant    Expected Outcomes Short Term goal: Utilizing psychosocial counselor, staff and physician to assist with identification of specific Stressors or current issues interfering with healing process. Setting desired goal for each stressor or current issue identified.;Long Term Goal: Stressors or current issues are controlled or eliminated.;Short Term goal: Identification and review with participant of any Quality of Life or Depression  concerns found by scoring the questionnaire.;Long Term goal: The participant improves quality of Life and PHQ9 Scores as seen by post scores and/or verbalization of changes             Quality of Life Scores:   Quality of Life - 05/11/23 0756       Quality of Life   Select Quality of Life      Quality of Life Scores   Health/Function Pre 22.5 %    Health/Function Post 19.86 %    Health/Function % Change -11.73 %    Socioeconomic Pre 26.88 %    Socioeconomic Post 27 %    Socioeconomic % Change  0.45 %    Psych/Spiritual Pre 28.14 %    Psych/Spiritual Post 26.21 %    Psych/Spiritual % Change -6.86 %    Family Pre 27.1 %    Family Post 27.6 %  Family % Change 1.85 %    GLOBAL Pre 25.29 %    GLOBAL Post 23.8 %    GLOBAL % Change -5.89 %            Scores of 19 and below usually indicate a poorer quality of life in these areas.  A difference of  2-3 points is a clinically meaningful difference.  A difference of 2-3 points in the total score of the Quality of Life Index has been associated with significant improvement in overall quality of life, self-image, physical symptoms, and general health in studies assessing change in quality of life.  PHQ-9: Review Flowsheet  More data exists      05/16/2023 05/11/2023 04/11/2023 03/09/2023 01/18/2023  Depression screen PHQ 2/9  Decreased Interest 1 1 0 0 0  Down, Depressed, Hopeless 1 0 0 0 0  PHQ - 2 Score 2 1 0 0 0  Altered sleeping 3 3 3 3 3   Tired, decreased energy 3 2 3 3 3   Change in appetite 3 3 3 3 1   Feeling bad or failure about yourself  0 0 0 0 0  Trouble concentrating 0 0 0 0 0  Moving slowly or fidgety/restless 0 0 0 0 0  Suicidal thoughts 0 0 0 0 0  PHQ-9 Score 11 9 9 9 7   Difficult doing work/chores Not difficult at all Somewhat difficult Not difficult at all Not difficult at all Somewhat difficult   Interpretation of Total Score  Total Score Depression Severity:  1-4 = Minimal depression, 5-9 = Mild  depression, 10-14 = Moderate depression, 15-19 = Moderately severe depression, 20-27 = Severe depression   Psychosocial Evaluation and Intervention:  Psychosocial Evaluation - 01/12/23 1149       Psychosocial Evaluation & Interventions   Interventions Encouraged to exercise with the program and follow exercise prescription    Comments There are no barriers to attending the program.  She does have exercise barriers.  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 Her doctor wants her to do the program.  She lives with her husband.  He helps her as needed and she has a daughter,grandson and her sister. for support. She has been dealing with multiple hospitalizations this year.   One being shoulder surgery, PT after surgery.  She developed a new tear in her shoulder and cannot have any surgery. She is ready to attend the program    Expected Outcomes STG attends all scheduled sessions, is able to see exercise progression even with her limited use of legs and 1 arm. LTG  able to continue her exercise progression.    Continue Psychosocial Services  Follow up required by staff             Psychosocial Re-Evaluation:  Psychosocial Re-Evaluation     Row Name 02/28/23 [phone] 03/09/23 0741 04/11/23 0739 05/16/23 0758       Psychosocial Re-Evaluation   Current issues with Current Sleep Concerns Current Stress Concerns Current Stress Concerns Current Stress Concerns    Comments Kenidy states that she has no stress or mental health concerns at the moment, but does report that she has difficulty falling asleep and staying asleep. Reviewed patient health questionnaire (PHQ-9) with patient for follow up. Previously, patients score indicated signs/symptoms of depression.  Reviewed to see if patient is improving symptom wise while in program.  Score declined and patient states that it is because  she is tired all the time and has learned to cope  with it. Reviewed patient health questionnaire (PHQ-9) with patient for follow up. Previously, patients score indicated signs/symptoms of depression.  Reviewed to see if patient is improving symptom wise while in program.  Score declined and patient states that it is because she is tired all the time and has learned to cope with it. Reviewed patient health questionnaire (PHQ-9) with patient for follow up. Previously, patients score indicated signs/symptoms of depression.  Her score was up from 9 to 11 since the last review. However, she attributes her higher score and increased stress levels to pain she has been feeling in her neck and kidneys. She is going to the doctor today about her pain and hopes to find a way of relief.    Expected Outcomes Short: Continue to attend [facility] regularly for regular exercise and social engagement. Long: Continue to improve symptoms and manage a positive mental state. Short: Continue to work toward an improvement in PHQ9 scores by attending [facility] regularly. Long: Continue to improve stress and depression coping skills by talking with staff and attending [facility] regularly and work toward a positive mental state. Short: Continue to work toward an improvement in PHQ9 scores by attending [facility] regularly. Long: Continue to improve stress and depression coping skills by talking with staff and attending [facility] regularly and work toward a positive mental state. Short: Continue to work toward an improvement in PHQ9 scores by exercising regularly. Long: Continue to improve stress and depression coping skills by talking with staff and attending [facility] regularly and work toward a positive mental state.    Interventions Encouraged to attend Cardiac Rehabilitation for the exercise Encouraged to attend Cardiac Rehabilitation for the exercise Encouraged to attend Cardiac Rehabilitation for the exercise Encouraged to attend Cardiac Rehabilitation for  the exercise    Continue Psychosocial Services  Follow up required by staff Follow up required by staff Follow up required by staff Follow up required by staff             Psychosocial Discharge (Final Psychosocial Re-Evaluation):  Psychosocial Re-Evaluation - 05/16/23 0758       Psychosocial Re-Evaluation   Current issues with Current Stress Concerns    Comments Reviewed patient health questionnaire (PHQ-9) with patient for follow up. Previously, patients score indicated signs/symptoms of depression.  Her score was up from 9 to 11 since the last review. However, she attributes her higher score and increased stress levels to pain she has been feeling in her neck and kidneys. She is going to the doctor today about her pain and hopes to find a way of relief.    Expected Outcomes Short: Continue to work toward an improvement in PHQ9 scores by exercising regularly. Long: Continue to improve stress and depression coping skills by talking with staff and attending [facility] regularly and work toward a positive mental state.    Interventions Encouraged to attend Cardiac Rehabilitation for the exercise    Continue Psychosocial Services  Follow up required by staff             Vocational Rehabilitation: Provide vocational rehab assistance to qualifying candidates.   Vocational Rehab Evaluation & Intervention:  Vocational Rehab - 01/12/23 1138       Initial Vocational Rehab Evaluation & Intervention   Assessment shows need for Vocational Rehabilitation No      Vocational Rehab Re-Evaulation   Comments retired      Systems developer Rehabilitation retired  Education: Education Goals: Education classes will be provided on a variety of topics geared toward better understanding of heart health and risk factor modification. Participant will state understanding/return demonstration of topics presented as noted by education test  scores.  Learning Barriers/Preferences:   General Cardiac Education Topics:  AED/CPR: - Group verbal and written instruction with the use of models to demonstrate the basic use of the AED with the basic ABC's of resuscitation.   Anatomy and Cardiac Procedures: - Group verbal and visual presentation and models provide information about basic cardiac anatomy and function. Reviews the testing methods done to diagnose heart disease and the outcomes of the test results. Describes the treatment choices: Medical Management, Angioplasty, or Coronary Bypass Surgery for treating various heart conditions including Myocardial Infarction, Angina, Valve Disease, and Cardiac Arrhythmias.  Written material given at graduation. Flowsheet Row Cardiac Rehab from 04/20/2023 in [facility] Cardiac and Pulmonary Rehab  Education need identified 01/18/23  Date 02/02/23  Educator SB  Instruction Review Code 1- Verbalizes Understanding       Medication Safety: - Group verbal and visual instruction to review commonly prescribed medications for heart and lung disease. Reviews the medication, class of the drug, and side effects. Includes the steps to properly store meds and maintain the prescription regimen.  Written material given at graduation. Flowsheet Row Cardiac Rehab from 04/20/2023 in [facility] Cardiac and Pulmonary Rehab  Date 04/20/23  Educator SB  Instruction Review Code 1- Verbalizes Understanding       Intimacy: - Group verbal instruction through game format to discuss how heart and lung disease can affect sexual intimacy. Written material given at graduation.. Flowsheet Row Cardiac Rehab from 04/20/2023 in [facility] Cardiac and Pulmonary Rehab  Education need identified 01/18/23  Date 03/23/23  Educator MC  Instruction Review Code 1- Verbalizes Understanding       Know Your Numbers and Heart Failure: - Group verbal and visual instruction to discuss disease risk factors for cardiac and pulmonary disease and  treatment options.  Reviews associated critical values for Overweight/Obesity, Hypertension, Cholesterol, and Diabetes.  Discusses basics of heart failure: signs/symptoms and treatments.  Introduces Heart Failure Zone chart for action plan for heart failure.  Written material given at graduation. Flowsheet Row Cardiac Rehab from 04/20/2023 in [facility] Cardiac and Pulmonary Rehab  Date 02/16/23  Educator SB  Instruction Review Code 1- Verbalizes Understanding       Infection Prevention: - Provides verbal and written material to individual with discussion of infection control including proper hand washing and proper equipment cleaning during exercise session. Flowsheet Row Cardiac Rehab from 04/20/2023 in [facility] Cardiac and Pulmonary Rehab  Date 01/18/23  Educator MB  Instruction Review Code 1- Verbalizes Understanding       Falls Prevention: - Provides verbal and written material to individual with discussion of falls prevention and safety. Flowsheet Row Cardiac Rehab from 04/20/2023 in [facility] Cardiac and Pulmonary Rehab  Date 01/18/23  Educator MB  Instruction Review Code 1- Verbalizes Understanding       Other: -Provides group and verbal instruction on various topics (see comments)   Knowledge Questionnaire Score:  Knowledge Questionnaire Score - 05/11/23 0754       Knowledge Questionnaire Score   Post Score 22/26             Core Components/Risk Factors/Patient Goals at Admission:  Personal Goals and Risk Factors at Admission - 01/18/23 1110       Core Components/Risk Factors/Patient Goals on Admission    Weight Management  Yes;Weight Loss    Intervention Weight Management: Develop a combined nutrition and exercise program designed to reach desired caloric intake, while maintaining appropriate intake of nutrient and fiber, sodium and fats, and appropriate energy expenditure required for the weight goal.;Weight Management: Provide education and appropriate resources to help  participant work on and attain dietary goals.;Weight Management/Obesity: Establish reasonable short term and long term weight goals.    Admit Weight 245 lb 3.2 oz (111.2 kg)    Goal Weight: Short Term 240 lb (108.9 kg)    Goal Weight: Long Term 180 lb (81.6 kg)    Expected Outcomes Short Term: Continue to assess and modify interventions until short term weight is achieved;Long Term: Adherence to nutrition and physical activity/exercise program aimed toward attainment of established weight goal;Weight Loss: Understanding of general recommendations for a balanced deficit meal plan, which promotes 1-2 lb weight loss per week and includes a negative energy balance of [id number] kcal/d;Understanding recommendations for meals to include 15-35% energy as protein, 25-35% energy from fat, 35-60% energy from carbohydrates, less than 200mg  of dietary cholesterol, 20-35 gm of total fiber daily;Understanding of distribution of calorie intake throughout the day with the consumption of 4-5 meals/snacks    Hypertension Yes    Intervention Provide education on lifestyle modifcations including regular physical activity/exercise, weight management, moderate sodium restriction and increased consumption of fresh fruit, vegetables, and low fat dairy, alcohol moderation, and smoking cessation.;Monitor prescription use compliance.    Expected Outcomes Short Term: Continued assessment and intervention until BP is < 140/32mm HG in hypertensive participants. < 130/70mm HG in hypertensive participants with diabetes, heart failure or chronic kidney disease.;Long Term: Maintenance of blood pressure at goal levels.    Lipids Yes    Intervention Provide education and support for participant on nutrition & aerobic/resistive exercise along with prescribed medications to achieve LDL 70mg , HDL >40mg .    Expected Outcomes Short Term: Participant states understanding of desired cholesterol values and is compliant with medications prescribed.  Participant is following exercise prescription and nutrition guidelines.;Long Term: Cholesterol controlled with medications as prescribed, with individualized exercise RX and with personalized nutrition plan. Value goals: LDL < 70mg , HDL > 40 mg.             Education:Diabetes - Individual verbal and written instruction to review signs/symptoms of diabetes, desired ranges of glucose level fasting, after meals and with exercise. Acknowledge that pre and post exercise glucose checks will be done for 3 sessions at entry of program.   Core Components/Risk Factors/Patient Goals Review:   Goals and Risk Factor Review     Row Name 02/28/23 0748 03/09/23 0744 04/11/23 0743 05/16/23 0804       Core Components/Risk Factors/Patient Goals Review   Personal Goals Review Hypertension Weight Management/Obesity;Hypertension Weight Management/Obesity;Hypertension Weight Management/Obesity;Hypertension    Review Baeleigh states that she only checks her blood pressure when she does not feel the best. We have encouraged her to take it at least once a day on non-program days. We will continue to check in with her about her hypertension. Erleen states no other questions about her rehab. She is checking her blood pressure once a day at home. Her blood pressure has been within normal limits in Rehab. She wants to try to lose more weight and reach her weight goal. Francena Hanly weighed in today at 239 lbs. She is still working on losing weight with a weight goal of 220 lbs. She continues to check her BP at home once a day which she reports  can run a little high after she does household chores. She states that she had a BP reading of 160/90 after sweeping the floors at home. Her blood pressure has stayed within normal ranges in rehab. Estefana weighed in today at 234 lb. She is down 5 lbs from the last review and is still working on losing weight with a short term weight goal of 220 lbs. She also continues to check her BP once a day  at home. She states that it has stayed within normal ranges except for days when she is in more pain, which causes it to run higher. We encouraged her to continue to check her BP regularly once she graduates from the program.    Expected Outcomes Short: Start taking blood pressure everyday. Long: Continue to monitor hypertension Short: lose a few pounds in a couple weeks. Long: continue to work towards weight goal. Short: Continue to work towards weight goal through diet and exercise. Long: Continue to manage lifestyle risk factors. Short: Continue to work towards weight goal through diet and exercise. Long: Continue to manage lifestyle risk factors.             Core Components/Risk Factors/Patient Goals at Discharge (Final Review):   Goals and Risk Factor Review - 05/16/23 0804       Core Components/Risk Factors/Patient Goals Review   Personal Goals Review Weight Management/Obesity;Hypertension    Review Braeleigh weighed in today at 234 lb. She is down 5 lbs from the last review and is still working on losing weight with a short term weight goal of 220 lbs. She also continues to check her BP once a day at home. She states that it has stayed within normal ranges except for days when she is in more pain, which causes it to run higher. We encouraged her to continue to check her BP regularly once she graduates from the program.    Expected Outcomes Short: Continue to work towards weight goal through diet and exercise. Long: Continue to manage lifestyle risk factors.             ITP Comments:  ITP Comments     Row Name 01/12/23 1156 01/18/23 1059 01/26/23 0819 02/01/23 0927 03/01/23 1144   ITP Comments Virtual orientation call completed today. shehas an appointment on Date: 01/18/2023  for EP eval and gym Orientation.  Documentation of diagnosis [facility] found in [facility] Date: 12/20/2022 . Completed and gym orientation. Initial ITP created and sent for review to Dr. Bethann Punches, Medical Director.  First full day of exercise!  Patient was oriented to gym and equipment including functions, settings, policies, and procedures.  Patient's individual exercise prescription and treatment plan were reviewed.  All starting workloads were established based on the results of the 6 minute walk test done at initial orientation visit.  The plan for exercise progression was also introduced and progression will be customized based on patient's performance and goals. 30 Day review completed. Medical Director ITP review done, changes made as directed, and signed approval by Medical Director. new to program 30 Day review completed. Medical Director ITP review done, changes made as directed, and signed approval by Medical Director.    Row Name 03/29/23 1257 04/26/23 0813 05/24/23 0821       ITP Comments 30 Day review completed. Medical Director ITP review done, changes made as directed, and signed approval by Medical Director. 30 Day review completed. Medical Director ITP review done, changes made as directed, and signed approval by  Medical Director. 30 Day review completed. Medical Director ITP review done, changes made as directed, and signed approval by Medical Director.              Comments: 30 Day review completed. Medical Director ITP review done, changes made as directed, and signed approval by Medical Director.

## 2023-05-30 ENCOUNTER — Encounter: Payer: PPO | Admitting: *Deleted

## 2023-05-30 DIAGNOSIS — I213 ST elevation (STEMI) myocardial infarction of unspecified site: Secondary | ICD-10-CM | POA: Diagnosis not present

## 2023-05-30 DIAGNOSIS — Z955 Presence of coronary angioplasty implant and graft: Secondary | ICD-10-CM

## 2023-05-30 NOTE — Progress Notes (Signed)
 Daily Session Note  Patient Details  Name: MARCUS GROLL MRN: [id number] Date of Birth: [birth year] Referring Provider:   Flowsheet Row Cardiac Rehab from 01/18/2023 in [facility] Cardiac [facility] Pul[facility] Rehab  Referring Provider Tiajuana Amass, MD       Encounter Date: 05/30/2023  Check In:  Session Check In - 05/30/23 0823       Check-In   Supervising physician immediately available to respond to emergencies See telemetry face sheet for immediately available ER MD    Location [facility]-Cardiac & Pulmonary Rehab    Staff Present Cora Collum, RN, BSN, CCRP;Margaret Best, MS, Exercise Physiologist;Jason Wallace Cullens RDN,LDN    Virtual Visit No    Medication changes reported     No    Fall or balance concerns reported    No    Warm-up and Cool-down Performed on first and last piece of equipment    Resistance Training Performed Yes    VAD Patient? No    PAD/SET Patient? No      Pain Assessment   Currently in Pain? No/denies                Social History   Tobacco Use  Smoking Status Former   Current packs/day: 0.00   Average packs/day: 0.5 packs/day for 30.0 years (15.0 ttl pk-yrs)   Types: Cigarettes   Start date: 46   Quit date: 2005   Years since quitting: 20.2  Smokeless Tobacco Never    Goals Met:  Independence with exercise equipment Exercise tolerated well No report of concerns or symptoms today  Goals Unmet:  Not Applicable  Comments: Pt able to follow exercise prescription today without complaint.  Will continue to monitor for progression.    Dr. Bethann Punches is Medical Director for [facility] Cardiac Rehabilitation.  Dr. Vida Rigger is Medical Director for [facility] Pulmonary Rehabilitation.

## 2023-06-01 ENCOUNTER — Encounter: Payer: PPO | Admitting: *Deleted

## 2023-06-01 DIAGNOSIS — Z955 Presence of coronary angioplasty implant and graft: Secondary | ICD-10-CM

## 2023-06-01 DIAGNOSIS — I213 ST elevation (STEMI) myocardial infarction of unspecified site: Secondary | ICD-10-CM

## 2023-06-01 NOTE — Progress Notes (Signed)
 Daily Session Note  Patient Details  Name: JANELL KEELING MRN: [id number] Date of Birth: [birth year] Referring Provider:   Flowsheet Row Cardiac Rehab from 01/18/2023 in [facility] Cardiac and Pulmonary Rehab  Referring Provider Tiajuana Amass, MD       Encounter Date: 06/01/2023  Check In:  Session Check In - 06/01/23 0754       Check-In   Supervising physician immediately available to respond to emergencies See telemetry face sheet for immediately available ER MD    Location [facility]-Cardiac & Pulmonary Rehab    Staff Present Rory Percy, MS, Exercise Physiologist;Jerald Hennington, RN, BSN, CCRP;Joseph Hood RCP,RRT,BSRT;Jason Wallace Cullens RDN,LDN    Virtual Visit No    Medication changes reported     No    Fall or balance concerns reported    No    Warm-up and Cool-down Performed on first and last piece of equipment    Resistance Training Performed Yes    VAD Patient? No    PAD/SET Patient? No      Pain Assessment   Currently in Pain? No/denies                Social History   Tobacco Use  Smoking Status Former   Current packs/day: 0.00   Average packs/day: 0.5 packs/day for 30.0 years (15.0 ttl pk-yrs)   Types: Cigarettes   Start date: 41   Quit date: 2005   Years since quitting: 20.2  Smokeless Tobacco Never    Goals Met:  Independence with exercise equipment Exercise tolerated well No report of concerns or symptoms today  Goals Unmet:  Not Applicable  Comments: Pt able to follow exercise prescription today without complaint.  Will continue to monitor for progression.    Dr. Bethann Punches is Medical Director for [facility] Cardiac Rehabilitation.  Dr. Vida Rigger is Medical Director for [facility] Pulmonary Rehabilitation.

## 2023-06-05 DIAGNOSIS — I5022 Chronic systolic (congestive) heart failure: Secondary | ICD-10-CM | POA: Diagnosis not present

## 2023-06-05 DIAGNOSIS — E782 Mixed hyperlipidemia: Secondary | ICD-10-CM | POA: Diagnosis not present

## 2023-06-05 DIAGNOSIS — I252 Old myocardial infarction: Secondary | ICD-10-CM | POA: Diagnosis not present

## 2023-06-05 DIAGNOSIS — I890 Lymphedema, not elsewhere classified: Secondary | ICD-10-CM | POA: Diagnosis not present

## 2023-06-05 DIAGNOSIS — I10 Essential (primary) hypertension: Secondary | ICD-10-CM | POA: Diagnosis not present

## 2023-06-05 DIAGNOSIS — I872 Venous insufficiency (chronic) (peripheral): Secondary | ICD-10-CM | POA: Diagnosis not present

## 2023-06-05 DIAGNOSIS — I25118 Atherosclerotic heart disease of native coronary artery with other forms of angina pectoris: Secondary | ICD-10-CM | POA: Diagnosis not present

## 2023-06-05 DIAGNOSIS — E119 Type 2 diabetes mellitus without complications: Secondary | ICD-10-CM | POA: Diagnosis not present

## 2023-06-05 DIAGNOSIS — G4733 Obstructive sleep apnea (adult) (pediatric): Secondary | ICD-10-CM | POA: Diagnosis not present

## 2023-06-05 DIAGNOSIS — R0602 Shortness of breath: Secondary | ICD-10-CM | POA: Diagnosis not present

## 2023-06-05 DIAGNOSIS — N1831 Chronic kidney disease, stage 3a: Secondary | ICD-10-CM | POA: Diagnosis not present

## 2023-06-05 DIAGNOSIS — J439 Emphysema, unspecified: Secondary | ICD-10-CM | POA: Diagnosis not present

## 2023-06-06 ENCOUNTER — Encounter: Payer: PPO | Admitting: *Deleted

## 2023-06-06 DIAGNOSIS — I213 ST elevation (STEMI) myocardial infarction of unspecified site: Secondary | ICD-10-CM | POA: Diagnosis not present

## 2023-06-06 DIAGNOSIS — Z955 Presence of coronary angioplasty implant and graft: Secondary | ICD-10-CM

## 2023-06-06 NOTE — Progress Notes (Signed)
 Cardiac Individual Treatment Plan  Patient Details  Name: Jacqueline Rhodes MRN: [id number] Date of Birth: [birth year] Referring Provider:   Flowsheet Row Cardiac Rehab from 01/18/2023 in [facility] Cardiac and Pulmonary Rehab  Referring Provider Tiajuana Amass, MD       Initial Encounter Date:  Flowsheet Row Cardiac Rehab from 01/18/2023 in [facility] Cardiac and Pulmonary Rehab  Date 01/18/23       Visit Diagnosis: ST elevation myocardial infarction (STEMI), unspecified artery [facility])  Status post coronary artery stent placement  Patient's Home Medications on Admission:  Current Outpatient Medications:    acetaminophen (TYLENOL) 500 MG tablet, Take 2 tablets (1,000 mg total) by mouth every 8 (eight) hours. (Patient taking differently: Take 1,000 mg by mouth every 8 (eight) hours as needed for mild pain (pain score 1-3), moderate pain (pain score 4-6), fever or headache.), Disp: 90 tablet, Rfl: 2   albuterol (PROVENTIL HFA;VENTOLIN HFA) 108 (90 Base) MCG/ACT inhaler, Inhale 2 puffs into the lungs every 6 (six) hours as needed for wheezing or shortness of breath. , Disp: , Rfl:    aspirin 81 MG chewable tablet, Chew 1 tablet (81 mg total) by mouth daily., Disp: 30 tablet, Rfl: 0   Calcium Fructoborate POWD, 20 mcg by Does not apply route. Cream, Disp: , Rfl:    Cetirizine HCl 10 MG CAPS, Take 10 mg by mouth daily. , Disp: , Rfl:    Cholecalciferol 25 MCG (1000 UT) TBDP, Take 125 mcg by mouth., Disp: , Rfl:    Coenzyme Q10 (COQ-10) 100 MG CAPS, Take 100 mg by mouth daily. , Disp: , Rfl:    ezetimibe (ZETIA) 10 MG tablet, Take 1 tablet (10 mg total) by mouth daily., Disp: 30 tablet, Rfl: 0   fluticasone furoate-vilanterol (BREO ELLIPTA) 100-25 MCG/INH AEPB, Inhale 1 puff into the lungs daily. , Disp: , Rfl:    folic acid (FOLVITE) 1 MG tablet, Take 1 mg by mouth daily., Disp: , Rfl:    furosemide (LASIX) 20 MG tablet, Take 20 mg by mouth daily as needed for fluid or edema., Disp: , Rfl:     isosorbide mononitrate (IMDUR) 60 MG 24 hr tablet, Take 2 tablets (120 mg total) by mouth daily., Disp: 60 tablet, Rfl: 0   levothyroxine (SYNTHROID) 150 MCG tablet, Take 150 mcg by mouth daily before breakfast., Disp: , Rfl:    meclizine (ANTIVERT) 25 MG tablet, Take 25 mg by mouth 3 (three) times daily as needed for dizziness., Disp: , Rfl:    methotrexate (RHEUMATREX) 2.5 MG tablet, Take 20 mg by mouth every Thursday. , Disp: , Rfl:    metoprolol succinate (TOPROL-XL) 25 MG 24 hr tablet, Take 1 tablet (25 mg total) by mouth 2 (two) times daily., Disp: 60 tablet, Rfl: 0   nitroGLYCERIN (NITROSTAT) 0.4 MG SL tablet, Place 1 tablet (0.4 mg total) under the tongue once every 5 (five) minutes as needed for chest pain. (Max of 3 doses in 15 minutes)., Disp: 25 tablet, Rfl: 0   pantoprazole (PROTONIX) 40 MG tablet, Take 40 mg by mouth 2 (two) times daily., Disp: , Rfl:    pravastatin (PRAVACHOL) 20 MG tablet, Take 1 tablet (20 mg total) by mouth daily., Disp: 30 tablet, Rfl: 0   Secukinumab (COSENTYX SENSOREADY 300 DOSE) 150 MG/ML SOAJ, Inject 300 mg into the skin every 28 (twenty-eight) days.  (Patient not taking: Reported on 01/12/2023), Disp: , Rfl:    spironolactone (ALDACTONE) 25 MG tablet, Take 0.5 tablet (12.5 mg total) by mouth  daily., Disp: 15 tablet, Rfl: 0   ticagrelor (BRILINTA) 90 MG TABS tablet, Take 1 tablet (90 mg total) by mouth 2 (two) times daily., Disp: 60 tablet, Rfl: 0   UNABLE TO FIND, Take 1.5 drops by mouth daily at 2 PM. Med Name: B12  5,000 unit, B6 3400 unit B1  and Niacin, Disp: , Rfl:   Past Medical History: Past Medical History:  Diagnosis Date   Anemia    Anginal pain (HCC)    Aortic atherosclerosis (HCC)    Asthma    B12 deficiency    Carpal tunnel syndrome    CHF (congestive heart failure) (HCC)    Chronic venous insufficiency    Complete tear of right rotator cuff    COPD (chronic obstructive pulmonary disease) (HCC)    Coronary artery disease 01/07/2005    a.) LHC 01/07/2005 (NSTEMI): LHC 60% pLAD, 70% D1, 30% pLCx, 20% pRCA, 25% dRCA - med mgmt; b.) LHC ([place]) 01/10/2008: 25/60% pLAD, 70% D1, 20% pLCx, 20% mRCA --> FFR normal --> med mgmt; c.) LHC ([place]) 01/04/2012:: 40/60% pLAD, 70% D1, 25% pLCx, 20% pRCA, 20% mRCA, 25% dRCA --> FFR normal --> med mgmt; d.) LHC ([place]) 01/10/2018: 20% pRCA, 50% pLAD, 55% oD1 -- med mgmt   Diverticulosis    DOE (dyspnea on exertion)    GERD (gastroesophageal reflux disease)    History of bilateral cataract extraction 2014   Hyperlipidemia    Hypertension    Hypothyroidism    a.) s/p LEFT hemithyroidectomy; on levothyroxine   Long term current use of aspirin    Long term current use of immunosuppressive drug    a.) MTX + secukinumab for RA/psoriatric arthritis diagnoses   Lower extremity edema    Lymphedema    NSTEMI (non-ST elevated myocardial infarction) (HCC) 01/06/2005   a.) LHC 01/07/2005: 60% pLAD, 70% D1, 30% pLCx, 20% pRCA, 25% dRCA -- med mgmt   OSA on CPAP    a.) compliance issues with prescribed nocturnal PAP therapy   Osteoarthritis    Peripheral vascular disease (HCC)    Post-menopausal osteoporosis    Psoriatic arthritis (HCC)    RA (rheumatoid arthritis) (HCC)    Renal insufficiency    T2DM (type 2 diabetes mellitus) (HCC)    Thyroid cancer (HCC)    a.) s/p LEFT hemithyroidectomy 08/2015    Tobacco Use: Social History   Tobacco Use  Smoking Status Former   Current packs/day: 0.00   Average packs/day: 0.5 packs/day for 30.0 years (15.0 ttl pk-yrs)   Types: Cigarettes   Start date: 24   Quit date: 2005   Years since quitting: 20.2  Smokeless Tobacco Never    Labs: Review Flowsheet       Latest Ref Rng & Units 11/11/2022 12/21/2022  Labs for ITP Cardiac and Pulmonary Rehab  Cholestrol 0 - 200 mg/dL - 829   LDL (calc) 0 - 99 mg/dL - 562   HDL-C >13 mg/dL - 50   Trlycerides <086 mg/dL - 60   Hemoglobin V7Q 4.8 - 5.6 % 6.3  -     Exercise Target Goals: Exercise Program  Goal: Individual exercise prescription set using results from initial 6 min walk test and THRR while considering  patient's activity barriers and safety.   Exercise Prescription Goal: Initial exercise prescription builds to 30-45 minutes a day of aerobic activity, 2-3 days per week.  Home exercise guidelines will be given to patient during program as part of exercise prescription that the participant will acknowledge.  Education: Aerobic Exercise: - Group verbal and visual presentation on the components of exercise prescription. Introduces F.I.T.T principle from ACSM for exercise prescriptions.  Reviews F.I.T.T. principles of aerobic exercise including progression. Written material given at graduation. Flowsheet Row Cardiac Rehab from 04/20/2023 in [facility] Cardiac and Pulmonary Rehab  Education need identified 01/18/23  Date 03/23/23  Educator MC  Instruction Review Code 1- [employer] Understanding       Education: Resistance Exercise: - Group verbal and visual presentation on the components of exercise prescription. Introduces F.I.T.T principle from ACSM for exercise prescriptions  Reviews F.I.T.T. principles of resistance exercise including progression. Written material given at graduation. Flowsheet Row Cardiac Rehab from 04/20/2023 in [facility] Cardiac and Pulmonary Rehab  Date 03/16/23  Educator MB  Instruction Review Code 1- [employer] Understanding        Education: Exercise & Equipment Safety: - Individual verbal instruction and demonstration of equipment use and safety with use of the equipment. Flowsheet Row Cardiac Rehab from 04/20/2023 in [facility] Cardiac and Pulmonary Rehab  Date 01/18/23  Educator MB  Instruction Review Code 1- Verbalizes Understanding       Education: Exercise Physiology & General Exercise Guidelines: - Group verbal and written instruction with models to review the exercise physiology of the cardiovascular system and associated critical values. Provides general  exercise guidelines with specific guidelines to those with heart or lung disease.  Flowsheet Row Cardiac Rehab from 04/20/2023 in [facility] Cardiac and Pulmonary Rehab  Education need identified 01/18/23  Date 03/09/23  Educator MC  Instruction Review Code 1- [employer] Understanding       Education: Flexibility, Balance, Mind/Body Relaxation: - Group verbal and visual presentation with interactive activity on the components of exercise prescription. Introduces F.I.T.T principle from ACSM for exercise prescriptions. Reviews F.I.T.T. principles of flexibility and balance exercise training including progression. Also discusses the mind body connection.  Reviews various relaxation techniques to help reduce and manage stress (i.e. Deep breathing, progressive muscle relaxation, and visualization). Balance handout provided to take home. Written material given at graduation. Flowsheet Row Cardiac Rehab from 04/20/2023 in [facility] Cardiac and Pulmonary Rehab  Date 03/16/23  Educator MB  Instruction Review Code 1- Verbalizes Understanding       Activity Barriers & Risk Stratification:  Activity Barriers & Cardiac Risk Stratification - 01/18/23 1102       Activity Barriers & Cardiac Risk Stratification   Activity Barriers Joint Problems;Other (comment);Arthritis;Right Knee Replacement    Comments nerve damage knee down R nerve damage to foot to toes. left foot, Right Shoulder tear    Cardiac Risk Stratification High             6 Minute Walk:  6 Minute Walk     Row Name 01/18/23 1059 05/09/23 0806       6 Minute Walk   Phase Initial Discharge    Distance 600 feet 780 feet    Distance % Change -- 30 %    Distance Feet Change -- 180 ft    Walk Time 5.75 minutes 5.5 minutes    # of Rest Breaks 2 2    MPH 1.19 1.61    METS 1 1.61    RPE 13 15    Perceived Dyspnea  3 3    VO2 Peak 3.46 6.19    Symptoms No Yes (comment)    Comments -- shoulder pain 8/10, back pain, legs    Resting HR 80 bpm  81 bpm    Resting BP 130/64 130/70  Resting Oxygen Saturation  98 % 96 %    Exercise Oxygen Saturation  during 6 min walk 98 % 93 %    Max Ex. HR 91 bpm 125 bpm    Max Ex. BP 142/70 142/80    2 Minute Post BP 126/66 138/80             Oxygen Initial Assessment:   Oxygen Re-Evaluation:   Oxygen Discharge (Final Oxygen Re-Evaluation):   Initial Exercise Prescription:  Initial Exercise Prescription - 01/18/23 1100       Date of Initial Exercise RX and Referring Provider   Date 01/18/23    Referring Provider Tiajuana Amass, MD      Oxygen   Maintain Oxygen Saturation 88% or higher      Recumbant Bike   Level 1    RPM 50    Watts 15    Minutes 15    METs 1      NuStep   Level 1    SPM 80    Minutes 15    METs 1      Biostep-RELP   Level 1    SPM 50    Minutes 15    METs 1      Track   Laps 10    Minutes 15    METs 1.54      Prescription Details   Frequency (times per week) 2    Duration Progress to 30 minutes of continuous aerobic without signs/symptoms of physical distress      Intensity   THRR 40-80% of Max Heartrate 108-136    Ratings of Perceived Exertion 11-13    Perceived Dyspnea 0-4      Progression   Progression Continue to progress workloads to maintain intensity without signs/symptoms of physical distress.      Resistance Training   Training Prescription Yes    Weight 2 lb    Reps 10-15             Perform Capillary Blood Glucose checks as needed.  Exercise Prescription Changes:   Exercise Prescription Changes     Row Name 01/18/23 1100 02/07/23 1400 02/23/23 1500 03/02/23 0700 03/09/23 0900     Response to Exercise   Blood Pressure (Admit) 130/64 106/60 132/78 -- 132/60   Blood Pressure (Exercise) 142/70 158/70 -- -- 164/64   Blood Pressure (Exit) 126/66 110/60 112/60 -- 122/66   Heart Rate (Admit) 90 bpm 58 bpm 57 bpm -- 60 bpm   Heart Rate (Exercise) 91 bpm 126 bpm 100 bpm -- 108 bpm   Heart Rate (Exit) 80 bpm  86 bpm 81 bpm -- 66 bpm   Oxygen Saturation (Admit) 98 % -- -- -- --   Oxygen Saturation (Exercise) 98 % -- -- -- --   Oxygen Saturation (Exit) 97 % -- -- -- --   Rating of Perceived Exertion (Exercise) 13 -- 16 -- 16   Perceived Dyspnea (Exercise) 3 -- -- -- --   Symptoms none none none -- none   Comments results First 3 days of full exercise -- -- --   Duration Progress to 30 minutes of  aerobic without signs/symptoms of physical distress Continue with 30 min of aerobic exercise without signs/symptoms of physical distress. Continue with 30 min of aerobic exercise without signs/symptoms of physical distress. -- Continue with 30 min of aerobic exercise without signs/symptoms of physical distress.   Intensity THRR New THRR unchanged THRR unchanged -- THRR unchanged  Progression   Progression Continue to progress workloads to maintain intensity without signs/symptoms of physical distress. Continue to progress workloads to maintain intensity without signs/symptoms of physical distress. Continue to progress workloads to maintain intensity without signs/symptoms of physical distress. -- Continue to progress workloads to maintain intensity without signs/symptoms of physical distress.   Average METs 1 2.43 2.69 -- 3.07     Resistance Training   Training Prescription -- Yes Yes -- Yes   Weight -- 2 lb 3 lb -- 3 lb   Reps -- 10-15 10-15 -- 10-15     Interval Training   Interval Training -- No No -- No     Recumbant Bike   Level -- 3 1 -- 1   Watts -- 15 16 -- 15   Minutes -- 15 15 -- 15   METs -- 2.42 2.46 -- 2.43     NuStep   Level -- 2 2 -- 2   Minutes -- 15 15 -- 15   METs -- 3 3.2 -- 4.2     Biostep-RELP   Level -- 1 1 -- 1   Minutes -- 15 15 -- 15   METs -- -- 3 -- 3     Home Exercise Plan   Plans to continue exercise at -- -- -- [employer] (comment)  Plans to walk the perimeter of stores or the [employer] (comment)  Plans to walk the perimeter of  stores or the mall   Frequency -- -- -- Add 3 additional days to program exercise sessions. Add 3 additional days to program exercise sessions.   Initial Home Exercises Provided -- -- -- 03/02/23 03/02/23     Oxygen   Maintain Oxygen Saturation -- 88% or higher 88% or higher 88% or higher 88% or higher    Row Name 03/23/23 1600 04/06/23 1100 04/20/23 0800 05/03/23 1100 05/18/23 0800     Response to Exercise   Blood Pressure (Admit) 144/80 118/68 138/74 128/72 134/68   Blood Pressure (Exit) 118/62 108/62 112/66 116/68 112/60   Heart Rate (Admit) 84 bpm 105 bpm 96 bpm 68 bpm 90 bpm   Heart Rate (Exercise) 112 bpm 96 bpm 120 bpm 106 bpm 113 bpm   Heart Rate (Exit) 68 bpm 130 bpm 78 bpm 85 bpm 97 bpm   Rating of Perceived Exertion (Exercise) 13 15 15 15 13    Perceived Dyspnea (Exercise) -- 2 -- -- --   Symptoms knee/leg pain none none none none   Duration Continue with 30 min of aerobic exercise without signs/symptoms of physical distress. Continue with 30 min of aerobic exercise without signs/symptoms of physical distress. Continue with 30 min of aerobic exercise without signs/symptoms of physical distress. Continue with 30 min of aerobic exercise without signs/symptoms of physical distress. Continue with 30 min of aerobic exercise without signs/symptoms of physical distress.   Intensity THRR unchanged THRR unchanged THRR unchanged THRR unchanged THRR unchanged     Progression   Progression Continue to progress workloads to maintain intensity without signs/symptoms of physical distress. Continue to progress workloads to maintain intensity without signs/symptoms of physical distress. Continue to progress workloads to maintain intensity without signs/symptoms of physical distress. Continue to progress workloads to maintain intensity without signs/symptoms of physical distress. Continue to progress workloads to maintain intensity without signs/symptoms of physical distress.   Average METs 3.83 3.28  3.03 3.25 4.17     Resistance Training   Training Prescription Yes Yes Yes Yes Yes   Weight 3  lb 3 lb 3 lb 3 lb 3 lb   Reps 10-15 10-15 10-15 10-15 10-15     Interval Training   Interval Training No No No No No     Recumbant Bike   Level -- 1 1 2  --   Watts -- 18 18 15  --   Minutes -- 21 15 15  --   METs -- 2.52 2.2 2.52 --     NuStep   Level 3 3 3 3 3    Minutes 15 15 15 15 15    METs 4.6 4.1 4.2 4.8 4.8     Biostep-RELP   Level 1 1 2 1 1    Minutes 15 15 15 15 15    METs 3 3 2 3 3      Home Exercise Plan   Plans to continue exercise at [employer] (comment)  Plans to walk the perimeter of stores or the mall [employer] (comment)  Plans to walk the perimeter of stores or the mall [employer] (comment)  Plans to walk the perimeter of stores or the mall [employer] (comment)  Plans to walk the perimeter of stores or the mall [employer] (comment)  Plans to walk the perimeter of stores or the mall   Frequency Add 3 additional days to program exercise sessions. Add 3 additional days to program exercise sessions. Add 3 additional days to program exercise sessions. Add 3 additional days to program exercise sessions. Add 3 additional days to program exercise sessions.   Initial Home Exercises Provided 03/02/23 03/02/23 03/02/23 03/02/23 03/02/23     Oxygen   Maintain Oxygen Saturation 88% or higher 88% or higher 88% or higher 88% or higher 88% or higher    Row Name 06/01/23 1800             Response to Exercise   Blood Pressure (Admit) 130/68       Blood Pressure (Exit) 120/78       Heart Rate (Admit) 76 bpm       Heart Rate (Exercise) 123 bpm       Heart Rate (Exit) 92 bpm       Oxygen Saturation (Admit) 94 %       Oxygen Saturation (Exercise) 94 %       Oxygen Saturation (Exit) 95 %       Rating of Perceived Exertion (Exercise) 15       Symptoms none       Duration Continue with 30 min of aerobic exercise without signs/symptoms of physical  distress.       Intensity THRR unchanged         Progression   Progression Continue to progress workloads to maintain intensity without signs/symptoms of physical distress.       Average METs 3.75         Resistance Training   Training Prescription Yes       Weight 3 lb       Reps 10-15         Interval Training   Interval Training No         NuStep   Level 3       Minutes 15       METs 4.5         Biostep-RELP   Level 2       Minutes 15       METs 3         Home Exercise Plan   Plans to continue exercise at  [employer] (comment)  Plans to walk the perimeter of stores or the mall       Frequency Add 3 additional days to program exercise sessions.       Initial Home Exercises Provided 03/02/23         Oxygen   Maintain Oxygen Saturation 88% or higher                Exercise Comments:   Exercise Comments     Row Name 01/12/23 1157 01/26/23 0820 06/06/23 0741       Exercise Comments Both of her legs have nerve damage and it is hard for her to walk long distances, She cannot use the treadmill. Her doctor wants her to do the program. One being shoulder surgery, PT after surgery. She developed a new tear in her shoulder and cannot have any surgery. First full day of exercise!  Patient was oriented to gym and equipment including functions, settings, policies, and procedures.  Patient's individual exercise prescription and treatment plan were reviewed.  All starting workloads were established based on the results of the 6 minute walk test done at initial orientation visit.  The plan for exercise progression was also introduced and progression will be customized based on patient's performance and goals. Evoleth graduated today from  rehab with 36 sessions completed.  Details of the patient's exercise prescription and what She needs to do in order to continue the prescription and progress were discussed with patient.  Patient was given a copy of prescription and goals.   Patient verbalized understanding. Jouri plans to continue to exercise by walking.              Exercise Goals and Review:   Exercise Goals     Row Name 01/18/23 1106             Exercise Goals   Increase Physical Activity Yes       Intervention Provide advice, education, support and counseling about physical activity/exercise needs.;Develop an individualized exercise prescription for aerobic and resistive training based on initial evaluation findings, risk stratification, comorbidities and participant's personal goals.       Expected Outcomes Short Term: Attend rehab on a regular basis to increase amount of physical activity.;Long Term: Add in home exercise to make exercise part of routine and to increase amount of physical activity.;Long Term: Exercising regularly at least 3-5 days a week.       Increase Strength and Stamina Yes       Intervention Provide advice, education, support and counseling about physical activity/exercise needs.;Develop an individualized exercise prescription for aerobic and resistive training based on initial evaluation findings, risk stratification, comorbidities and participant's personal goals.       Expected Outcomes Short Term: Increase workloads from initial exercise prescription for resistance, speed, and METs.;Short Term: Perform resistance training exercises routinely during rehab and add in resistance training at home;Long Term: Improve cardiorespiratory fitness, muscular endurance and strength as measured by increased METs and functional capacity ( )       Able to understand and use rate of perceived exertion (RPE) scale Yes       Intervention Provide education and explanation on how to use RPE scale       Expected Outcomes Short Term: Able to use RPE daily in rehab to express subjective intensity level;Long Term:  Able to use RPE to guide intensity level when exercising independently       Able to understand and use Dyspnea scale Yes  Intervention Provide education and explanation on how to use Dyspnea scale       Expected Outcomes Short Term: Able to use Dyspnea scale daily in rehab to express subjective sense of shortness of breath during exertion;Long Term: Able to use Dyspnea scale to guide intensity level when exercising independently       Knowledge and understanding of Target Heart Rate Range (THRR) Yes       Intervention Provide education and explanation of THRR including how the numbers were predicted and where they are located for reference       Expected Outcomes Short Term: Able to state/look up THRR;Short Term: Able to use daily as guideline for intensity in rehab;Long Term: Able to use THRR to govern intensity when exercising independently       Able to check pulse independently Yes       Intervention Provide education and demonstration on how to check pulse in carotid and radial arteries.;Review the importance of being able to check your own pulse for safety during independent exercise       Expected Outcomes Short Term: Able to explain why pulse checking is important during independent exercise;Long Term: Able to check pulse independently and accurately       Understanding of Exercise Prescription Yes       Intervention Provide education, explanation, and written materials on patient's individual exercise prescription       Expected Outcomes Short Term: Able to explain program exercise prescription;Long Term: Able to explain home exercise prescription to exercise independently                Exercise Goals Re-Evaluation :  Exercise Goals Re-Evaluation     Row Name 01/26/23 0820 02/07/23 1447 02/23/23 1549 02/28/23 0739 03/02/23 0756     Exercise Goal Re-Evaluation   Exercise Goals Review Able to understand and use rate of perceived exertion (RPE) scale;Able to understand and use Dyspnea scale;Knowledge and understanding of Target Heart Rate Range (THRR);Understanding of Exercise Prescription Increase  Physical Activity;Increase Strength and Stamina;Understanding of Exercise Prescription Increase Physical Activity;Increase Strength and Stamina;Understanding of Exercise Prescription Increase Physical Activity;Knowledge and understanding of Target Heart Rate Range (THRR);Able to check pulse independently Increase Physical Activity;Knowledge and understanding of Target Heart Rate Range (THRR);Able to check pulse independently;Able to understand and use Dyspnea scale;Increase Strength and Stamina;Able to understand and use rate of perceived exertion (RPE) scale;Understanding of Exercise Prescription   Comments Reviewed RPE and dyspnea scale, THR and program prescription with pt today.  Pt voiced understanding and was given a copy of goals to take home. Edla is off to a good start in the program. She did well as she increased to level 3 on the recumbent bike and level 2 on the T4 nustep. She did not do any walking during her first 3 sessions of rehab. We will continue to monitor her progress in the program. Alaisha is doing well in the program. She has continued to work at level 2 on the T4 nustep and level 1 on the biostep. She also decreased her recumbent bike workload back down to level 1 after previously working at level 3. She has not done any walking during the program at this time. She did increase from 2 lb to 3 lb hand weights. We will continue to monitor her progress in the program. Isadora continues to do well in the program, but is awaiting a home exercise prescription. We will continue to monitor her progress in the program and prescribe home exercise  when appropriate. Reviewed home exercise with pt today.  Pt plans to walk the perimeter of stores and the mall for exercise.  Reviewed THR, pulse, RPE, sign and symptoms, pulse oximetery and when to call 911 or MD.  Also discussed weather considerations and indoor options.  Pt voiced understanding.   Expected Outcomes Short: Use RPE daily to regulate  intensity. Long: Follow program prescription in THR. Short: Continue to follow current exercise prescription. Long: Continue exercise to improve strength and stamina. Short: Increase back up to level 3 on the recumbent bike, begin walking the track. Long: Continue exercise to improve strength and stamina. Short: Meet with an EP to discuss home exercise plans. Long: Continue exercise and eventually start home exercise regiment. Short: Begin walking on days away from rehab. Long: Continue exercise to improve strength and stamina.    Row Name 03/09/23 0945 03/23/23 1623 04/06/23 1120 04/11/23 0735 04/20/23 0820     Exercise Goal Re-Evaluation   Exercise Goals Review Increase Physical Activity;Increase Strength and Stamina;Understanding of Exercise Prescription Increase Physical Activity;Increase Strength and Stamina;Understanding of Exercise Prescription Increase Physical Activity;Increase Strength and Stamina;Understanding of Exercise Prescription Increase Physical Activity;Increase Strength and Stamina;Understanding of Exercise Prescription Increase Physical Activity;Increase Strength and Stamina;Understanding of Exercise Prescription   Comments Damien is doing great in rehab. She hasnt been able to increase her levels, but has been able to maintain her workloads on the T4 nustep, biostep and recumbent bike. We will continue to encourage and monitor her progression in the program. Lexxie continues to do well in rehab. She recently increased from level 2 to level 3 on the T4 nustep. She also has done well on the biostep and continues to work at level 1. We will continue to monitor her progress in the program. Jazsmine continues to do well in rehab. She has maintained level 3 on the T4 nustep. She continues to work at level 1 on the biostep and recumbent bike. We will continue to monitor her progress in the program. Rolande continues to do well with exercise. She has been progressively increasing workloads in rehab  and is steadily improving. Walking around at stores when she is not in rehab. She reports that she walks around most of the store when she goes in order to get her steps in. We will continue to monitor her progress in the program. Daryn has only attended two sessions since the last review. She has continued to work at level 3 on the T4 nustep and level 1 on the recumbent bike. She did improve to level 2 on the biostep as well. We will continue to monitor her progress in the program.   Expected Outcomes Short: Increase to level 2 on the recumbent bike and biostep. Long: Continue exercise to improve strength and stamina. Short: Increase to level 2 on the biostep. Long: Continue exercise to improve strength and stamina. Short: Increase to level 2 on the biostep and recumbent bike. Long: Continue exercise to improve strength and stamina. Short: Continue to walk on days away from rehab. Long: Continue exercise to improve strength and stamina. Short: Attend rehab more consistently. Long: Continue exercise to improve strength and stamina.    Row Name 05/03/23 1141 05/16/23 0754 05/18/23 0849 06/01/23 0753 06/01/23 1831     Exercise Goal Re-Evaluation   Exercise Goals Review Increase Physical Activity;Increase Strength and Stamina;Understanding of Exercise Prescription Increase Physical Activity;Increase Strength and Stamina;Understanding of Exercise Prescription Increase Physical Activity;Increase Strength and Stamina;Understanding of Exercise Prescription Increase Physical Activity;Increase Strength and  Stamina;Understanding of Exercise Prescription Increase Physical Activity;Increase Strength and Stamina;Understanding of Exercise Prescription   Comments Eveline is doing well in the program. She is due for her post and will look to improve on it. She also improved to level 2 on the recumbent bike and continued to work at level 1 on the biostep and level 3 on the T4 nustep. We will continue to monitor her  progress in the program. Kiera is close to graduating from the program. She feels that she has done well with the exercise in rehab. She states that the main benefits she has noticed from the program have been gained strength in her legs and overall increase in stamina. She is looking into joining the [facility] as she has silver sneakers. She plans to exercise there twice a week to stick with the routine she started in cardiac rehab. Lilah is doing well and is close to graduating from the program. She recently completed her post and improved by 30%! She also continues to work at level 1 on the biostep and level 3 on the T4 nustep. We will continue to monitor her progress until she graduates from the program. Fonnie is doing well. She is on the T4 at level 3. She plans on [facility] after she graduates. Blossom is doing well in the program. She was able to increase from level 1 to 2 on the biostpep. We will continue to monitor her progress in the program.   Expected Outcomes Short: Improve on post . Long: Continue exercise to improve strength and stamina. Short: Graduate. Long: Continue to exercise independently. Short: Graduate. Long: Continue to exercise independently. STG: graduate, visit [facility]. LTG: Continue to exercise independently Short: Graduate. Long: Continue exercise to improve strength and stamina.            Discharge Exercise Prescription (Final Exercise Prescription Changes):  Exercise Prescription Changes - 06/01/23 1800       Response to Exercise   Blood Pressure (Admit) 130/68    Blood Pressure (Exit) 120/78    Heart Rate (Admit) 76 bpm    Heart Rate (Exercise) 123 bpm    Heart Rate (Exit) 92 bpm    Oxygen Saturation (Admit) 94 %    Oxygen Saturation (Exercise) 94 %    Oxygen Saturation (Exit) 95 %    Rating of Perceived Exertion (Exercise) 15    Symptoms none    Duration Continue with 30 min of aerobic exercise without signs/symptoms of physical  distress.    Intensity THRR unchanged      Progression   Progression Continue to progress workloads to maintain intensity without signs/symptoms of physical distress.    Average METs 3.75      Resistance Training   Training Prescription Yes    Weight 3 lb    Reps 10-15      Interval Training   Interval Training No      NuStep   Level 3    Minutes 15    METs 4.5      Biostep-RELP   Level 2    Minutes 15    METs 3      Home Exercise Plan   Plans to continue exercise at [facility] (comment)   Plans to walk the perimeter of stores or the mall   Frequency Add 3 additional days to program exercise sessions.    Initial Home Exercises Provided 03/02/23      Oxygen   Maintain Oxygen Saturation 88% or  higher             Nutrition:  Target Goals: Understanding of nutrition guidelines, daily intake of sodium 1500mg , cholesterol 200mg , calories 30% from fat and 7% or less from saturated fats, daily to have 5 or more servings of fruits and vegetables.  Education: All About Nutrition: -Group instruction provided by verbal, written material, interactive activities, discussions, models, and posters to present general guidelines for heart healthy nutrition including fat, fiber, MyPlate, the role of sodium in heart healthy nutrition, utilization of the nutrition label, and utilization of this knowledge for meal planning. Follow up email sent as well. Written material given at graduation. Flowsheet Row Cardiac Rehab from 04/20/2023 in [facility] Cardiac and Pulmonary Rehab  Education need identified 01/18/23  Date 01/26/23  Educator JG  Instruction Review Code 1- Verbalizes Understanding       Biometrics:  Pre Biometrics - 01/18/23 1106       Pre Biometrics   Height 5' 5.5" (1.664 m)    Weight 245 lb 3.2 oz (111.2 kg)    Waist Circumference 44.5 inches    Hip Circumference 55 inches    Waist to Hip Ratio 0.81 %    BMI (Calculated) 40.17    Single Leg Stand 0.6 seconds              Post Biometrics - 05/09/23 9147        Post  Biometrics   Height 5' 5.5" (1.664 m)    Weight 233 lb 3.2 oz (105.8 kg)    Waist Circumference 45 inches    Hip Circumference 52.5 inches    Waist to Hip Ratio 0.86 %    BMI (Calculated) 38.2    Single Leg Stand 1 seconds             Nutrition Therapy Plan and Nutrition Goals:  Nutrition Therapy & Goals - 01/18/23 1108       Nutrition Therapy   RD appointment deferred Yes      Personal Nutrition Goals   Nutrition Goal Patient deferred RD appointment      Intervention Plan   Intervention Prescribe, educate and counsel regarding individualized specific dietary modifications aiming towards targeted core components such as weight, hypertension, lipid management, diabetes, heart failure and other comorbidities.;Nutrition handout(s) given to patient.    Expected Outcomes Short Term Goal: Understand basic principles of dietary content, such as calories, fat, sodium, cholesterol and nutrients.;Short Term Goal: A plan has been developed with personal nutrition goals set during dietitian appointment.;Long Term Goal: Adherence to prescribed nutrition plan.             Nutrition Assessments:  MEDIFICTS Score Key: >=70 Need to make dietary changes  40-70 Heart Healthy Diet <= 40 Therapeutic Level Cholesterol Diet  Flowsheet Row Cardiac Rehab from 05/11/2023 in [facility] Cardiac and Pulmonary Rehab  Picture Your Plate Total Score on Discharge 58      Picture Your Plate Scores: <82 Unhealthy dietary pattern with much room for improvement. 41-50 Dietary pattern unlikely to meet recommendations for good health and room for improvement. 51-60 More healthful dietary pattern, with some room for improvement.  >60 Healthy dietary pattern, although there may be some specific behaviors that could be improved.    Nutrition Goals Re-Evaluation:  Nutrition Goals Re-Evaluation     Row Name 02/28/23 [phone] 03/09/23 0739 04/11/23  0742 05/16/23 0758 06/01/23 0757     Goals   Nutrition Goal Patient deferred RD appointment Patient deferred RD appointment Patient deferred  RD appointment Patient deferred RD appointment deferred RD appointment            Nutrition Goals Discharge (Final Nutrition Goals Re-Evaluation):  Nutrition Goals Re-Evaluation - 06/01/23 0757       Goals   Nutrition Goal deferred RD appointment             Psychosocial: Target Goals: Acknowledge presence or absence of significant depression and/or stress, maximize coping skills, provide positive support system. Participant is able to verbalize types and ability to use techniques and skills needed for reducing stress and depression.   Education: Stress, Anxiety, and Depression - Group verbal and visual presentation to define topics covered.  Reviews how body is impacted by stress, anxiety, and depression.  Also discusses healthy ways to reduce stress and to treat/manage anxiety and depression.  Written material given at graduation. Flowsheet Row Cardiac Rehab from 04/20/2023 in [facility] Cardiac and Pulmonary Rehab  Education need identified 01/18/23  Date 03/02/23  Educator SB  Instruction Review Code 1- [employer] Understanding       Education: Sleep Hygiene -Provides group verbal and written instruction about how sleep can affect your health.  Define sleep hygiene, discuss sleep cycles and impact of sleep habits. Review good sleep hygiene tips.    Initial Review & Psychosocial Screening:  Initial Psych Review & Screening - 01/12/23 1130       Initial Review   Current issues with None Identified      Family Dynamics   Good Support System? Yes   husband, grandson,  sister     Barriers   Psychosocial barriers to participate in program There are no identifiable barriers or psychosocial needs.      Screening Interventions   Interventions Encouraged to exercise;To provide support and resources with identified psychosocial  needs;Provide feedback about the scores to participant    Expected Outcomes Short Term goal: Utilizing psychosocial counselor, staff and physician to assist with identification of specific Stressors or current issues interfering with healing process. Setting desired goal for each stressor or current issue identified.;Long Term Goal: Stressors or current issues are controlled or eliminated.;Short Term goal: Identification and review with participant of any Quality of Life or Depression concerns found by scoring the questionnaire.;Long Term goal: The participant improves quality of Life and PHQ9 Scores as seen by post scores and/or verbalization of changes             Quality of Life Scores:   Quality of Life - 05/11/23 0756       Quality of Life   Select Quality of Life      Quality of Life Scores   Health/Function Pre 22.5 %    Health/Function Post 19.86 %    Health/Function % Change -11.73 %    Socioeconomic Pre 26.88 %    Socioeconomic Post 27 %    Socioeconomic % Change  0.45 %    Psych/Spiritual Pre 28.14 %    Psych/Spiritual Post 26.21 %    Psych/Spiritual % Change -6.86 %    Family Pre 27.1 %    Family Post 27.6 %    Family % Change 1.85 %    GLOBAL Pre 25.29 %    GLOBAL Post 23.8 %    GLOBAL % Change -5.89 %            Scores of 19 and below usually indicate a poorer quality of life in these areas.  A difference of  2-3 points is a clinically meaningful difference.  A difference of 2-3 points in the total score of the Quality of Life Index has been associated with significant improvement in overall quality of life, self-image, physical symptoms, and general health in studies assessing change in quality of life.  PHQ-9: Review Flowsheet  More data exists      06/01/2023 05/16/2023 05/11/2023 04/11/2023 03/09/2023  Depression screen PHQ 2/9  Decreased Interest 1 1 1  0 0  Down, Depressed, Hopeless 1 1 0 0 0  PHQ - 2 Score 2 2 1  0 0  Altered sleeping 3 3 3 3 3   Tired,  decreased energy 3 3 2 3 3   Change in appetite 3 3 3 3 3   Feeling bad or failure about yourself  0 0 0 0 0  Trouble concentrating 0 0 0 0 0  Moving slowly or fidgety/restless 0 0 0 0 0  Suicidal thoughts 0 0 0 0 0  PHQ-9 Score 11 11 9 9 9   Difficult doing work/chores Somewhat difficult Not difficult at all Somewhat difficult Not difficult at all Not difficult at all   Interpretation of Total Score  Total Score Depression Severity:  1-4 = Minimal depression, 5-9 = Mild depression, 10-14 = Moderate depression, 15-19 = Moderately severe depression, 20-27 = Severe depression   Psychosocial Evaluation and Intervention:  Psychosocial Evaluation - 01/12/23 1149       Psychosocial Evaluation & Interventions   Interventions Encouraged to exercise with the program and follow exercise prescription    Comments There are no barriers to attending the program.  She does have exercise barriers.  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 Her doctor wants her to do the program.  She lives with her husband.  He helps her as needed and she has a daughter,grandson and her sister. for support. She has been dealing with multiple hospitalizations this year.   One being shoulder surgery, PT after surgery.  She developed a new tear in her shoulder and cannot have any surgery. She is ready to attend the program    Expected Outcomes STG attends all scheduled sessions, is able to see exercise progression even with her limited use of legs and 1 arm. LTG  able to continue her exercise progression.    Continue Psychosocial Services  Follow up required by staff             Psychosocial Re-Evaluation:  Psychosocial Re-Evaluation     Row Name 02/28/23 [phone] 03/09/23 0741 04/11/23 0739 05/16/23 0758 06/01/23 0755     Psychosocial Re-Evaluation   Current issues with Current Sleep Concerns Current Stress Concerns Current Stress Concerns Current Stress  Concerns None Identified   Comments Rolinda states that she has no stress or mental health concerns at the moment, but does report that she has difficulty falling asleep and staying asleep. Reviewed patient health questionnaire (PHQ-9) with patient for follow up. Previously, patients score indicated signs/symptoms of depression.  Reviewed to see if patient is improving symptom wise while in program.  Score declined and patient states that it is because she is tired all the time and has learned to cope with it. Reviewed patient health questionnaire (PHQ-9) with patient for follow up. Previously, patients score indicated signs/symptoms of depression.  Reviewed to see if patient is improving symptom wise while in program.  Score declined and patient states that it is because she is tired all the time and has learned to cope  with it. Reviewed patient health questionnaire (PHQ-9) with patient for follow up. Previously, patients score indicated signs/symptoms of depression.  Her score was up from 9 to 11 since the last review. However, she attributes her higher score and increased stress levels to pain she has been feeling in her neck and kidneys. She is going to the doctor today about her pain and hopes to find a way of relief. Completed PHQ-9 and she scored 11, similar concerns around sleep, tiredness, lack of interest in activities and poor appetite. She reports she has a good support system with her sisters.   Expected Outcomes Short: Continue to attend [facility] regularly for regular exercise and social engagement. Long: Continue to improve symptoms and manage a positive mental state. Short: Continue to work toward an improvement in PHQ9 scores by attending [facility] regularly. Long: Continue to improve stress and depression coping skills by talking with staff and attending [facility] regularly and work toward a positive mental state. Short: Continue to work toward an improvement in PHQ9 scores  by attending [facility] regularly. Long: Continue to improve stress and depression coping skills by talking with staff and attending [facility] regularly and work toward a positive mental state. Short: Continue to work toward an improvement in PHQ9 scores by exercising regularly. Long: Continue to improve stress and depression coping skills by talking with staff and attending [facility] regularly and work toward a positive mental state. STG: use support system, visit [facility]. LTG: maintain positive outlook on health and daily life   Interventions Encouraged to attend Cardiac Rehabilitation for the exercise Encouraged to attend Cardiac Rehabilitation for the exercise Encouraged to attend Cardiac Rehabilitation for the exercise Encouraged to attend Cardiac Rehabilitation for the exercise Encouraged to attend Cardiac Rehabilitation for the exercise   Continue Psychosocial Services  Follow up required by staff Follow up required by staff Follow up required by staff Follow up required by staff Follow up required by staff            Psychosocial Discharge (Final Psychosocial Re-Evaluation):  Psychosocial Re-Evaluation - 06/01/23 0755       Psychosocial Re-Evaluation   Current issues with None Identified    Comments Completed PHQ-9 and she scored 11, similar concerns around sleep, tiredness, lack of interest in activities and poor appetite. She reports she has a good support system with her sisters.    Expected Outcomes STG: use support system, visit [facility]. LTG: maintain positive outlook on health and daily life    Interventions Encouraged to attend Cardiac Rehabilitation for the exercise    Continue Psychosocial Services  Follow up required by staff             Vocational Rehabilitation: Provide vocational rehab assistance to qualifying candidates.   Vocational Rehab Evaluation & Intervention:  Vocational Rehab - 01/12/23 1138       Initial Vocational Rehab Evaluation & Intervention    Assessment shows need for Vocational Rehabilitation No      Vocational Rehab Re-Evaulation   Comments retired      Discharge Vocational Rehab   Discharge Vocational Rehabilitation retired             Education: Education Goals: Education classes will be provided on a variety of topics geared toward better understanding of heart health and risk factor modification. Participant will state understanding/return demonstration of topics presented as noted by education test scores.  Learning Barriers/Preferences:   General Cardiac Education Topics:  AED/CPR: - Group verbal and written instruction with the use of models  to demonstrate the basic use of the AED with the basic ABC's of resuscitation.   Anatomy and Cardiac Procedures: - Group verbal and visual presentation and models provide information about basic cardiac anatomy and function. Reviews the testing methods done to diagnose heart disease and the outcomes of the test results. Describes the treatment choices: Medical Management, Angioplasty, or Coronary Bypass Surgery for treating various heart conditions including Myocardial Infarction, Angina, Valve Disease, and Cardiac Arrhythmias.  Written material given at graduation. Flowsheet Row Cardiac Rehab from 04/20/2023 in [facility] Cardiac and Pulmonary Rehab  Education need identified 01/18/23  Date 02/02/23  Educator SB  Instruction Review Code 1- Verbalizes Understanding       Medication Safety: - Group verbal and visual instruction to review commonly prescribed medications for heart and lung disease. Reviews the medication, class of the drug, and side effects. Includes the steps to properly store meds and maintain the prescription regimen.  Written material given at graduation. Flowsheet Row Cardiac Rehab from 04/20/2023 in [facility] Cardiac and Pulmonary Rehab  Date 04/20/23  Educator SB  Instruction Review Code 1- Verbalizes Understanding       Intimacy: - Group verbal  instruction through game format to discuss how heart and lung disease can affect sexual intimacy. Written material given at graduation.. Flowsheet Row Cardiac Rehab from 04/20/2023 in [facility] Cardiac and Pulmonary Rehab  Education need identified 01/18/23  Date 03/23/23  Educator MC  Instruction Review Code 1- Verbalizes Understanding       Know Your Numbers and Heart Failure: - Group verbal and visual instruction to discuss disease risk factors for cardiac and pulmonary disease and treatment options.  Reviews associated critical values for Overweight/Obesity, Hypertension, Cholesterol, and Diabetes.  Discusses basics of heart failure: signs/symptoms and treatments.  Introduces Heart Failure Zone chart for action plan for heart failure.  Written material given at graduation. Flowsheet Row Cardiac Rehab from 04/20/2023 in [facility] Cardiac and Pulmonary Rehab  Date 02/16/23  Educator SB  Instruction Review Code 1- Verbalizes Understanding       Infection Prevention: - Provides verbal and written material to individual with discussion of infection control including proper hand washing and proper equipment cleaning during exercise session. Flowsheet Row Cardiac Rehab from 04/20/2023 in [facility] Cardiac and Pulmonary Rehab  Date 01/18/23  Educator MB  Instruction Review Code 1- Verbalizes Understanding       Falls Prevention: - Provides verbal and written material to individual with discussion of falls prevention and safety. Flowsheet Row Cardiac Rehab from 04/20/2023 in [facility] Cardiac and Pulmonary Rehab  Date 01/18/23  Educator MB  Instruction Review Code 1- Verbalizes Understanding       Other: -Provides group and verbal instruction on various topics (see comments)   Knowledge Questionnaire Score:  Knowledge Questionnaire Score - 05/11/23 0754       Knowledge Questionnaire Score   Post Score 22/26             Core Components/Risk Factors/Patient Goals at Admission:  Personal Goals  and Risk Factors at Admission - 01/18/23 1110       Core Components/Risk Factors/Patient Goals on Admission    Weight Management Yes;Weight Loss    Intervention Weight Management: Develop a combined nutrition and exercise program designed to reach desired caloric intake, while maintaining appropriate intake of nutrient and fiber, sodium and fats, and appropriate energy expenditure required for the weight goal.;Weight Management: Provide education and appropriate resources to help participant work on and attain dietary goals.;Weight Management/Obesity: Establish reasonable short  term and long term weight goals.    Admit Weight 245 lb 3.2 oz (111.2 kg)    Goal Weight: Short Term 240 lb (108.9 kg)    Goal Weight: Long Term 180 lb (81.6 kg)    Expected Outcomes Short Term: Continue to assess and modify interventions until short term weight is achieved;Long Term: Adherence to nutrition and physical activity/exercise program aimed toward attainment of established weight goal;Weight Loss: Understanding of general recommendations for a balanced deficit meal plan, which promotes 1-2 lb weight loss per week and includes a negative energy balance of [id number] kcal/d;Understanding recommendations for meals to include 15-35% energy as protein, 25-35% energy from fat, 35-60% energy from carbohydrates, less than 200mg  of dietary cholesterol, 20-35 gm of total fiber daily;Understanding of distribution of calorie intake throughout the day with the consumption of 4-5 meals/snacks    Hypertension Yes    Intervention Provide education on lifestyle modifcations including regular physical activity/exercise, weight management, moderate sodium restriction and increased consumption of fresh fruit, vegetables, and low fat dairy, alcohol moderation, and smoking cessation.;Monitor prescription use compliance.    Expected Outcomes Short Term: Continued assessment and intervention until BP is < 140/14mm HG in hypertensive  participants. < 130/39mm HG in hypertensive participants with diabetes, heart failure or chronic kidney disease.;Long Term: Maintenance of blood pressure at goal levels.    Lipids Yes    Intervention Provide education and support for participant on nutrition & aerobic/resistive exercise along with prescribed medications to achieve LDL 70mg , HDL >40mg .    Expected Outcomes Short Term: Participant states understanding of desired cholesterol values and is compliant with medications prescribed. Participant is following exercise prescription and nutrition guidelines.;Long Term: Cholesterol controlled with medications as prescribed, with individualized exercise RX and with personalized nutrition plan. Value goals: LDL < 70mg , HDL > 40 mg.             Education:Diabetes - Individual verbal and written instruction to review signs/symptoms of diabetes, desired ranges of glucose level fasting, after meals and with exercise. Acknowledge that pre and post exercise glucose checks will be done for 3 sessions at entry of program.   Core Components/Risk Factors/Patient Goals Review:   Goals and Risk Factor Review     Row Name 02/28/23 0748 03/09/23 0744 04/11/23 0743 05/16/23 0804 06/01/23 0758     Core Components/Risk Factors/Patient Goals Review   Personal Goals Review Hypertension Weight Management/Obesity;Hypertension Weight Management/Obesity;Hypertension Weight Management/Obesity;Hypertension Weight Management/Obesity   Review Riley states that she only checks her blood pressure when she does not feel the best. We have encouraged her to take it at least once a day on non-program days. We will continue to check in with her about her hypertension. Geniyah states no other questions about her rehab. She is checking her blood pressure once a day at home. Her blood pressure has been within normal limits in Rehab. She wants to try to lose more weight and reach her weight goal. Francena Hanly weighed in today at 239  lbs. She is still working on losing weight with a weight goal of 220 lbs. She continues to check her BP at home once a day which she reports can run a little high after she does household chores. She states that she had a BP reading of 160/90 after sweeping the floors at home. Her blood pressure has stayed within normal ranges in rehab. Haidynn weighed in today at 234 lb. She is down 5 lbs from the last review and is still working on losing weight  with a short term weight goal of 220 lbs. She also continues to check her BP once a day at home. She states that it has stayed within normal ranges except for days when she is in more pain, which causes it to run higher. We encouraged her to continue to check her BP regularly once she graduates from the program. Spoke with Danyelle about nutrition, she is working on getting more protein as her appeitite has been poor. She likes veggies but doesnt eat but maybe 3 servings per day of veggies. Encouraged her to priortize protein and nutrient dense foods.   Expected Outcomes Short: Start taking blood pressure everyday. Long: Continue to monitor hypertension Short: lose a few pounds in a couple weeks. Long: continue to work towards weight goal. Short: Continue to work towards weight goal through diet and exercise. Long: Continue to manage lifestyle risk factors. Short: Continue to work towards weight goal through diet and exercise. Long: Continue to manage lifestyle risk factors. STG: Continue to work towards weight goal through diet and exercise. LTG: Continue to manage lifestyle risk factors            Core Components/Risk Factors/Patient Goals at Discharge (Final Review):   Goals and Risk Factor Review - 06/01/23 0758       Core Components/Risk Factors/Patient Goals Review   Personal Goals Review Weight Management/Obesity    Review Spoke with Meiling about nutrition, she is working on getting more protein as her appeitite has been poor. She likes veggies but  doesnt eat but maybe 3 servings per day of veggies. Encouraged her to priortize protein and nutrient dense foods.    Expected Outcomes STG: Continue to work towards weight goal through diet and exercise. LTG: Continue to manage lifestyle risk factors             ITP Comments:  ITP Comments     Row Name 01/12/23 1156 01/18/23 1059 01/26/23 0819 02/01/23 0927 03/01/23 1144   ITP Comments Virtual orientation call completed today. shehas an appointment on Date: 01/18/2023  for EP eval and gym Orientation.  Documentation of diagnosis can be found in [facility] Date: 12/20/2022 . Completed and gym orientation. Initial ITP created and sent for review to Dr. Bethann Punches, Medical Director. First full day of exercise!  Patient was oriented to gym and equipment including functions, settings, policies, and procedures.  Patient's individual exercise prescription and treatment plan were reviewed.  All starting workloads were established based on the results of the 6 minute walk test done at initial orientation visit.  The plan for exercise progression was also introduced and progression will be customized based on patient's performance and goals. 30 Day review completed. Medical Director ITP review done, changes made as directed, and signed approval by Medical Director. new to program 30 Day review completed. Medical Director ITP review done, changes made as directed, and signed approval by Medical Director.    Row Name 03/29/23 1257 04/26/23 0813 05/24/23 0821 06/06/23 0741     ITP Comments 30 Day review completed. Medical Director ITP review done, changes made as directed, and signed approval by Medical Director. 30 Day review completed. Medical Director ITP review done, changes made as directed, and signed approval by Medical Director. 30 Day review completed. Medical Director ITP review done, changes made as directed, and signed approval by Medical Director. Chisom graduated today from  rehab with 36 sessions  completed.  Details of the patient's exercise prescription and what She needs to do in  order to continue the prescription and progress were discussed with patient.  Patient was given a copy of prescription and goals.  Patient verbalized understanding. Jeroline plans to continue to exercise by walking.             Comments: Discharge ITP

## 2023-06-06 NOTE — Progress Notes (Signed)
 Daily Session Note  Patient Details  Name: Jacqueline Rhodes MRN: [id number] Date of Birth: May 09, 1955 Referring Provider:   Flowsheet Row Cardiac Rehab from 01/18/2023 in [facility] Cardiac and Pulmonary Rehab  Referring Provider Tiajuana Amass, MD       Encounter Date: 06/06/2023  Check In:  Session Check In - 06/06/23 0738       Check-In   Supervising physician immediately available to respond to emergencies See telemetry face sheet for immediately available ER MD    Location [facility]-Cardiac & Pulmonary Rehab    Staff Present Rory Percy, MS, Exercise Physiologist;Maxon Conetta BS, Exercise Physiologist;Noah Tickle, BS, Exercise Physiologist;Marieliz Strang, RN, BSN, CCRP    Virtual Visit No    Medication changes reported     No    Fall or balance concerns reported    No    Warm-up and Cool-down Performed on first and last piece of equipment    Resistance Training Performed Yes    VAD Patient? No    PAD/SET Patient? No      Pain Assessment   Currently in Pain? No/denies                Social History   Tobacco Use  Smoking Status Former   Current packs/day: 0.00   Average packs/day: 0.5 packs/day for 30.0 years (15.0 ttl pk-yrs)   Types: Cigarettes   Start date: 40   Quit date: 2005   Years since quitting: 20.2  Smokeless Tobacco Never    Goals Met:  Independence with exercise equipment Exercise tolerated well Personal goals reviewed No report of concerns or symptoms today  Goals Unmet:  Not Applicable  Comments: Pt able to follow exercise prescription today without complaint.   Lilly graduated today from  rehab with 36 sessions completed.  Details of the patient's exercise prescription and what She needs to do in order to continue the prescription and progress were discussed with patient.  Patient was given a copy of prescription and goals.  Patient verbalized understanding. Darleene plans to continue to exercise by walking.    Dr. Bethann Punches is Medical  Director for [facility] Cardiac Rehabilitation.  Dr. Vida Rigger is Medical Director for [facility] Pulmonary Rehabilitation.

## 2023-06-06 NOTE — Progress Notes (Signed)
 Discharge Note for  Jacqueline Rhodes     1955-11-30       Jacqueline Rhodes graduated today from  rehab with 36 sessions completed.  Details of the patient's exercise prescription and what Jacqueline Rhodes needs to do in order to continue the prescription and progress were discussed with patient.  Patient was given a copy of prescription and goals.  Patient verbalized understanding. Jacqueline Rhodes plans to continue to exercise by walking.     6 Minute Walk     Row Name 01/18/23 1059 05/09/23 0806       6 Minute Walk   Phase Initial Discharge    Distance 600 feet 780 feet    Distance % Change -- 30 %    Distance Feet Change -- 180 ft    Walk Time 5.75 minutes 5.5 minutes    # of Rest Breaks 2 2    MPH 1.19 1.61    METS 1 1.61    RPE 13 15    Perceived Dyspnea  3 3    VO2 Peak 3.46 6.19    Symptoms No Yes (comment)    Comments -- shoulder pain 8/10, back pain, legs    Resting HR 80 bpm 81 bpm    Resting BP 130/64 130/70    Resting Oxygen Saturation  98 % 96 %    Exercise Oxygen Saturation  during 6 min walk 98 % 93 %    Max Ex. HR 91 bpm 125 bpm    Max Ex. BP 142/70 142/80    2 Minute Post BP 126/66 138/80

## 2023-06-14 DIAGNOSIS — E039 Hypothyroidism, unspecified: Secondary | ICD-10-CM | POA: Diagnosis not present

## 2023-06-14 DIAGNOSIS — E782 Mixed hyperlipidemia: Secondary | ICD-10-CM | POA: Diagnosis not present

## 2023-06-14 DIAGNOSIS — I10 Essential (primary) hypertension: Secondary | ICD-10-CM | POA: Diagnosis not present

## 2023-06-14 DIAGNOSIS — E119 Type 2 diabetes mellitus without complications: Secondary | ICD-10-CM | POA: Diagnosis not present

## 2023-06-16 ENCOUNTER — Other Ambulatory Visit: Payer: Self-pay

## 2023-06-16 ENCOUNTER — Emergency Department
Admission: EM | Admit: 2023-06-16 | Discharge: 2023-06-16 | Disposition: A | Discharge status: Home / Self Care | Attending: Emergency Medicine | Admitting: Emergency Medicine

## 2023-06-16 DIAGNOSIS — T782XXA Anaphylactic shock, unspecified, initial encounter: Secondary | ICD-10-CM | POA: Insufficient documentation

## 2023-06-16 DIAGNOSIS — T7840XA Allergy, unspecified, initial encounter: Secondary | ICD-10-CM | POA: Diagnosis present

## 2023-06-16 DIAGNOSIS — I2510 Atherosclerotic heart disease of native coronary artery without angina pectoris: Secondary | ICD-10-CM | POA: Diagnosis not present

## 2023-06-16 DIAGNOSIS — T783XXA Angioneurotic edema, initial encounter: Secondary | ICD-10-CM | POA: Insufficient documentation

## 2023-06-16 DIAGNOSIS — I10 Essential (primary) hypertension: Secondary | ICD-10-CM | POA: Diagnosis not present

## 2023-06-16 LAB — CBC
HCT: 41.2 % (ref 36.0–46.0)
Hemoglobin: 13.8 g/dL (ref 12.0–15.0)
MCH: 30.2 pg (ref 26.0–34.0)
MCHC: 33.5 g/dL (ref 30.0–36.0)
MCV: 90.2 fL (ref 80.0–100.0)
Platelets: 239 10*3/uL (ref 150–400)
RBC: 4.57 MIL/uL (ref 3.87–5.11)
RDW: 15.4 % (ref 11.5–15.5)
WBC: 4.7 10*3/uL (ref 4.0–10.5)
nRBC: 0 % (ref 0.0–0.2)

## 2023-06-16 LAB — COMPREHENSIVE METABOLIC PANEL WITH GFR
ALT: 15 U/L (ref 0–44)
AST: 16 U/L (ref 15–41)
Albumin: 3.6 g/dL (ref 3.5–5.0)
Alkaline Phosphatase: 66 U/L (ref 38–126)
Anion gap: 9 (ref 5–15)
BUN: 20 mg/dL (ref 8–23)
CO2: 29 mmol/L (ref 22–32)
Calcium: 9.3 mg/dL (ref 8.9–10.3)
Chloride: 100 mmol/L (ref 98–111)
Creatinine, Ser: 1.04 mg/dL — ABNORMAL HIGH (ref 0.44–1.00)
GFR, Estimated: 58 mL/min — ABNORMAL LOW (ref >60)
Glucose, Bld: 117 mg/dL — ABNORMAL HIGH (ref 70–99)
Potassium: 3.5 mmol/L (ref 3.5–5.1)
Sodium: 138 mmol/L (ref 135–145)
Total Bilirubin: 0.6 mg/dL (ref 0.0–1.2)
Total Protein: 6.9 g/dL (ref 6.5–8.1)

## 2023-06-16 MED ORDER — DIPHENHYDRAMINE HCL 50 MG/ML IJ SOLN
25.0000 mg | Freq: Once | INTRAMUSCULAR | Status: AC
  Administered 2023-06-16: 25 mg via INTRAVENOUS
  Filled 2023-06-16: qty 1

## 2023-06-16 MED ORDER — ALUM & MAG HYDROXIDE-SIMETH 200-200-20 MG/5ML PO SUSP
15.0000 mL | Freq: Once | ORAL | Status: AC
  Administered 2023-06-16: 15 mL via ORAL
  Filled 2023-06-16: qty 30

## 2023-06-16 MED ORDER — EPINEPHRINE 0.15 MG/0.3ML IJ SOAJ
0.1500 mg | Freq: Once | INTRAMUSCULAR | Status: AC
  Administered 2023-06-16: 0.15 mg via INTRAMUSCULAR
  Filled 2023-06-16: qty 0.3

## 2023-06-16 MED ORDER — FAMOTIDINE IN NACL 20-0.9 MG/50ML-% IV SOLN
20.0000 mg | Freq: Once | INTRAVENOUS | Status: AC
  Administered 2023-06-16: 20 mg via INTRAVENOUS
  Filled 2023-06-16: qty 50

## 2023-06-16 MED ORDER — PREDNISONE 50 MG PO TABS: 50.0000 mg | ORAL_TABLET | Freq: Every day | ORAL | 0 refills | Status: AC

## 2023-06-16 MED ORDER — LIDOCAINE VISCOUS HCL 2 % MT SOLN
15.0000 mL | Freq: Once | OROMUCOSAL | Status: AC
  Administered 2023-06-16: 15 mL via OROMUCOSAL
  Filled 2023-06-16: qty 15

## 2023-06-16 MED ORDER — FAMOTIDINE 20 MG PO TABS: 20.0000 mg | ORAL_TABLET | Freq: Two times a day (BID) | ORAL | 0 refills | Status: AC

## 2023-06-16 MED ORDER — METHYLPREDNISOLONE SODIUM SUCC 125 MG IJ SOLR
125.0000 mg | Freq: Once | INTRAMUSCULAR | Status: AC
  Administered 2023-06-16: 125 mg via INTRAVENOUS
  Filled 2023-06-16: qty 2

## 2023-06-16 MED ORDER — EPINEPHRINE 0.15 MG/0.3ML IJ SOAJ: 0.1500 mg | INTRAMUSCULAR | 0 refills | Status: AC | PRN

## 2023-06-16 NOTE — ED Notes (Signed)
 Pt reports her pressure chest tightness has gone away but now feels chest burning in the epigastric region. MD notified. Tongue swelling is decreasing slightly

## 2023-06-16 NOTE — ED Provider Notes (Signed)
 [facility] Provider Note    Event Date/Time   First MD Initiated Contact with Patient 06/16/23 0915     (approximate)   History   Allergic Reaction   HPI  Jacqueline Rhodes is a 68 y.o. female past medical history significant for CAD, history of allergic reaction, who presents to the emergency department with feeling like her throat was swollen.  States that she started having swelling to her tongue and her mouth.  Felt like there was a tingling sensation.  Became short of breath and felt like it was difficult to take a deep breath.  States this has happened multiple times in the past.  The last time she received epinephrine she then had an ST elevation heart attack.  Denies being on any ACE inhibitor's.  No known family history of hereditary angioedema.     Physical Exam   Triage Vital Signs: ED Triage Vitals  Encounter Vitals Group     BP 06/16/23 0911 (!) 166/93     Systolic BP Percentile --      Diastolic BP Percentile --      Pulse Rate 06/16/23 0911 72     Resp 06/16/23 0911 18     Temp 06/16/23 0911 97.7 F (36.5 C)     Temp Source 06/16/23 0911 Oral     SpO2 06/16/23 0911 100 %     Weight 06/16/23 0910 233 lb 4 oz (105.8 kg)     Height --      Head Circumference --      Peak Flow --      Pain Score 06/16/23 0909 0     Pain Loc --      Pain Education --      Exclude from Growth Chart --     Most recent vital signs: Vitals:   06/16/23 0911 06/16/23 1200  BP: (!) 166/93 138/69  Pulse: 72 82  Resp: 18 (!) 22  Temp: 97.7 F (36.5 C)   SpO2: 100% 100%    Physical Exam Constitutional:      Appearance: She is well-developed.  HENT:     Head: Atraumatic.     Comments: Slurred speech with tongue swelling. Eyes:     Extraocular Movements: Extraocular movements intact.     Conjunctiva/sclera: Conjunctivae normal.     Pupils: Pupils are equal, round, and reactive to light.  Cardiovascular:     Rate and Rhythm: Regular rhythm.   Pulmonary:     Effort: No respiratory distress.     Breath sounds: Wheezing present.  Abdominal:     General: There is no distension.     Tenderness: There is no abdominal tenderness.  Musculoskeletal:        General: Normal range of motion.     Cervical back: Normal range of motion.     Right lower leg: No edema.     Left lower leg: No edema.  Skin:    General: Skin is warm.     Capillary Refill: Capillary refill takes less than 2 seconds.  Neurological:     Mental Status: She is alert. Mental status is at baseline.  Psychiatric:        Mood and Affect: Mood normal.      IMPRESSION / MDM / ASSESSMENT AND PLAN / ED COURSE  I reviewed the triage vital signs and the nursing notes.  Differential diagnosis of anaphylactic reaction, hereditary angioedema, allergic reaction to medication  On chart review multiple episodes in  the past of allergic reaction.  Patient meets criteria for anaphylactic reaction.  Patient is very concerned about having a full dose of epinephrine.  After shared decision making ultimately patient willing to do epi Junior of 0.15 IM and will reevaluate.  Will give IV Solu-Medrol, H1 blocker and Benadryl    Labs (all labs ordered are listed, but only abnormal results are displayed) Labs interpreted as -    Labs Reviewed  COMPREHENSIVE METABOLIC PANEL WITH GFR - Abnormal; Notable for the following components:      Result Value   Glucose, Bld 117 (*)    Creatinine, Ser 1.04 (*)    GFR, Estimated 58 (*)    All other components within normal limits  CBC    Initial EKG showed normal sinus rhythm.  Low voltage.  No significant ST elevation or depression.  No findings of acute ischemia or dysrhythmia.  EKG was obtained prior to receiving any epinephrine.  Complaining of some mild chest discomfort will obtain a repeat EKG. Clinical Course as of 06/16/23 1352  Fri Jun 16, 2023  1132 On reevaluation continues to have tongue swelling, states that she now has  a burning sensation in her chest.  Repeat EKG without findings of acute ischemia or dysrhythmia.  Most likely gastritis in the setting of steroid use.  Given Pepcid already for anaphylaxis.  Given viscous lidocaine and Maalox. [SM]  1200 On reevaluation patient had significant improvement of her tongue swelling and has gone down in size significantly.  No longer with shortness of breath.  Able to tolerate p.o.  Drinking and walking without any difficulties.  Will continue to monitor for any recurrence of angioedema/anaphylactic reaction. [SM]  1351 On reevaluation patient states she is feeling much better.  Significant improvement and no longer with swelling of her tongue.  Able to tolerate p.o.  Requesting that an EpiPen Junior be called in.  Discussed the risk and benefits of this medication and not giving a full dose of epinephrine.  She expressed understanding and states that she would want an EpiPen Junior and that she would come immediately to the emergency department.  Do a short course of steroids.  Placed a referral for ongoing outpatient follow-up with allergy clinic for further testing.  Discussed return to the emergency department for any recurrence of symptoms. [SM]    Clinical Course User Index [SM] Corena Herter, MD     PROCEDURES:  Critical Care performed: yes  .Critical Care  Performed by: Corena Herter, MD Authorized by: Corena Herter, MD   Critical care provider statement:    Critical care time (minutes):  30   Critical care time was exclusive of:  Separately billable procedures and treating other patients   Critical care was necessary to treat or prevent imminent or life-threatening deterioration of the following conditions: Angioedema, anaphylactic reaction.   Critical care was time spent personally by me on the following activities:  Development of treatment plan with patient or surrogate, discussions with consultants, evaluation of patient's response to treatment,  examination of patient, ordering and review of laboratory studies, ordering and review of radiographic studies, ordering and performing treatments and interventions, pulse oximetry, re-evaluation of patient's condition and review of old charts   Patient's presentation is most consistent with acute presentation with potential threat to life or bodily function.   MEDICATIONS ORDERED IN ED: Medications  diphenhydrAMINE (BENADRYL) injection 25 mg (25 mg Intravenous Given 06/16/23 0933)  methylPREDNISolone sodium succinate (SOLU-MEDROL) 125 mg/2 mL injection 125 mg (125  mg Intravenous Given 06/16/23 0934)  famotidine (PEPCID) IVPB 20 mg premix (0 mg Intravenous Stopped 06/16/23 1003)  EPINEPHrine (EPIPEN JR) injection 0.15 mg (0.15 mg Intramuscular Given 06/16/23 0924)  EPINEPHrine (EPIPEN JR) injection 0.15 mg (0.15 mg Intramuscular Given 06/16/23 1031)  lidocaine (XYLOCAINE) 2 % viscous mouth solution 15 mL (15 mLs Mouth/Throat Given 06/16/23 1138)  alum & mag hydroxide-simeth (MAALOX/MYLANTA) 200-200-20 MG/5ML suspension 15 mL (15 mLs Oral Given 06/16/23 1138)    FINAL CLINICAL IMPRESSION(S) / ED DIAGNOSES   Final diagnoses:  Angioedema, initial encounter  Anaphylaxis, initial encounter     Rx / DC Orders   ED Discharge Orders     None        Note:  This document was prepared using Dragon voice recognition software and may include unintentional dictation errors.   Corena Herter, MD 06/16/23 1352

## 2023-06-16 NOTE — Discharge Instructions (Addendum)
 You are seen in the emergency department for tongue swelling and concerns for angioedema.  You received epi and steroids with improvement of your symptoms.  You are given a prescription for an EpiPen Junior at your request given that you had a STEMI with epi in the past.  Know that this is half of the dose that you should be given for an anaphylactic reaction.  If you use it you need to immediately come to the emergency department.  Follow-up closely with your primary care physician.  You are given information to call and follow-up with [facility], they have allergy testing and can help further workup your angioedema.

## 2023-06-16 NOTE — ED Notes (Signed)
 Called CCMD to get Pt on monitor. Pt reported new onset  mild chest tightness after giving IV benadryl and IV solumedrol. MD notified and new EKG obtained. Informed Pt to let this RN know if CP or SOB increases, increase in tingling in mouth, difficult swallowing.

## 2023-06-16 NOTE — ED Notes (Signed)
 Pt ambulated to bathroom with minimal assistance.

## 2023-06-16 NOTE — ED Triage Notes (Signed)
 C/O allergic reaction and SOB since 0630.  Has had similar reaction since October.  C/O tongue swelling.  Benadryl liquid this morning at 0645, no improvement.  AAOx3.  Skin warm and dry. No SOB/ DOE.

## 2023-06-21 DIAGNOSIS — E538 Deficiency of other specified B group vitamins: Secondary | ICD-10-CM | POA: Diagnosis not present

## 2023-06-21 DIAGNOSIS — L4050 Arthropathic psoriasis, unspecified: Secondary | ICD-10-CM | POA: Diagnosis not present

## 2023-06-21 DIAGNOSIS — M0579 Rheumatoid arthritis with rheumatoid factor of multiple sites without organ or systems involvement: Secondary | ICD-10-CM | POA: Diagnosis not present

## 2023-06-21 DIAGNOSIS — I10 Essential (primary) hypertension: Secondary | ICD-10-CM | POA: Diagnosis not present

## 2023-06-21 DIAGNOSIS — Z0000 Encounter for general adult medical examination without abnormal findings: Secondary | ICD-10-CM | POA: Diagnosis not present

## 2023-06-21 DIAGNOSIS — E119 Type 2 diabetes mellitus without complications: Secondary | ICD-10-CM | POA: Diagnosis not present

## 2023-06-21 DIAGNOSIS — E782 Mixed hyperlipidemia: Secondary | ICD-10-CM | POA: Diagnosis not present

## 2023-06-21 DIAGNOSIS — N1831 Chronic kidney disease, stage 3a: Secondary | ICD-10-CM | POA: Diagnosis not present

## 2023-06-21 DIAGNOSIS — E039 Hypothyroidism, unspecified: Secondary | ICD-10-CM | POA: Diagnosis not present

## 2023-06-21 DIAGNOSIS — I25111 Atherosclerotic heart disease of native coronary artery with angina pectoris with documented spasm: Secondary | ICD-10-CM | POA: Diagnosis not present

## 2023-06-21 DIAGNOSIS — T783XXS Angioneurotic edema, sequela: Secondary | ICD-10-CM | POA: Diagnosis not present

## 2023-06-27 DIAGNOSIS — T782XXA Anaphylactic shock, unspecified, initial encounter: Secondary | ICD-10-CM | POA: Diagnosis not present

## 2023-06-27 DIAGNOSIS — J301 Allergic rhinitis due to pollen: Secondary | ICD-10-CM | POA: Diagnosis not present

## 2023-06-27 DIAGNOSIS — J305 Allergic rhinitis due to food: Secondary | ICD-10-CM | POA: Diagnosis not present

## 2023-07-03 DIAGNOSIS — M7581 Other shoulder lesions, right shoulder: Secondary | ICD-10-CM | POA: Diagnosis not present

## 2023-07-03 DIAGNOSIS — Z9889 Other specified postprocedural states: Secondary | ICD-10-CM | POA: Diagnosis not present

## 2023-07-03 DIAGNOSIS — M75121 Complete rotator cuff tear or rupture of right shoulder, not specified as traumatic: Secondary | ICD-10-CM | POA: Diagnosis not present

## 2023-07-04 ENCOUNTER — Other Ambulatory Visit: Payer: Self-pay | Admitting: Physician Assistant

## 2023-07-04 DIAGNOSIS — H9202 Otalgia, left ear: Secondary | ICD-10-CM

## 2023-07-04 DIAGNOSIS — R200 Anesthesia of skin: Secondary | ICD-10-CM

## 2023-07-04 DIAGNOSIS — R519 Headache, unspecified: Secondary | ICD-10-CM | POA: Diagnosis not present

## 2023-07-04 DIAGNOSIS — M542 Cervicalgia: Secondary | ICD-10-CM | POA: Diagnosis not present

## 2023-07-04 DIAGNOSIS — R202 Paresthesia of skin: Secondary | ICD-10-CM | POA: Diagnosis not present

## 2023-07-10 ENCOUNTER — Inpatient Hospital Stay: Admission: RE | Admit: 2023-07-10 | Source: Ambulatory Visit

## 2023-07-12 ENCOUNTER — Ambulatory Visit
Admission: RE | Admit: 2023-07-12 | Discharge: 2023-07-12 | Disposition: A | Discharge status: Home / Self Care | Source: Ambulatory Visit | Attending: Physician Assistant | Admitting: Physician Assistant

## 2023-07-12 DIAGNOSIS — R200 Anesthesia of skin: Secondary | ICD-10-CM

## 2023-07-12 DIAGNOSIS — H9202 Otalgia, left ear: Secondary | ICD-10-CM

## 2023-07-12 DIAGNOSIS — M542 Cervicalgia: Secondary | ICD-10-CM

## 2023-07-12 DIAGNOSIS — M47812 Spondylosis without myelopathy or radiculopathy, cervical region: Secondary | ICD-10-CM | POA: Diagnosis not present

## 2023-07-12 DIAGNOSIS — J305 Allergic rhinitis due to food: Secondary | ICD-10-CM | POA: Diagnosis not present

## 2023-07-12 MED ORDER — GADOPICLENOL 0.5 MMOL/ML IV SOLN
10.0000 mL | Freq: Once | INTRAVENOUS | Status: AC | PRN
  Administered 2023-07-12: 10 mL via INTRAVENOUS

## 2023-07-21 IMAGING — MG MM DIGITAL SCREENING BILAT W/ TOMO AND CAD
8 series · 8 of 24 positions shown · non-contrast
Comparison: Previous exam(s).

CLINICAL DATA: Screening.

EXAM:
DIGITAL SCREENING BILATERAL MAMMOGRAM WITH TOMOSYNTHESIS AND CAD
TECHNIQUE: Bilateral screening digital craniocaudal and mediolateral oblique
mammograms were obtained. Bilateral screening digital breast
tomosynthesis was performed. The images were evaluated with
computer-aided detection.

[L MLO synth-2D]
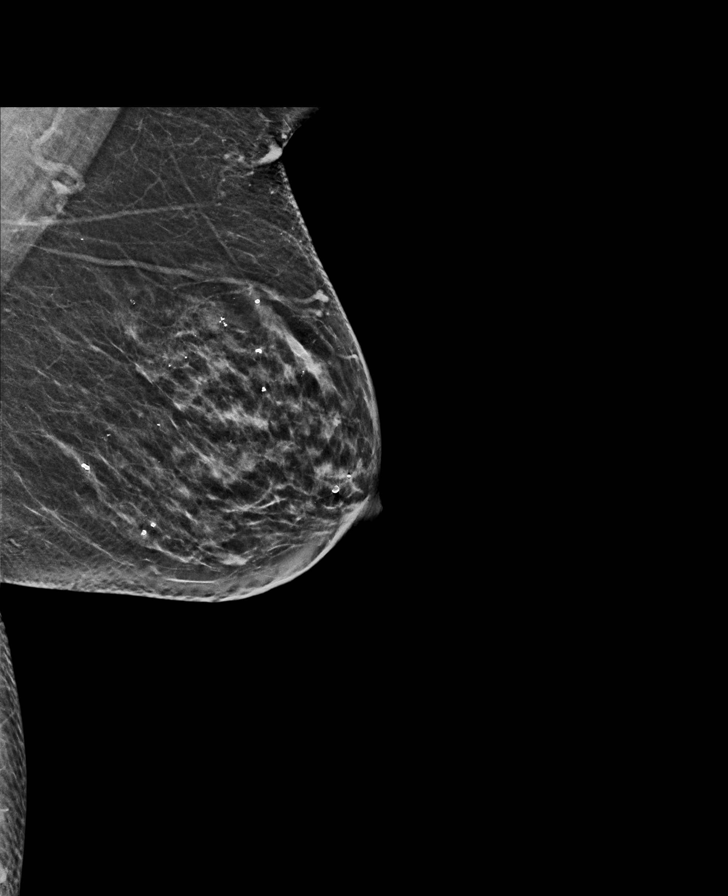

[R MLO synth-2D]
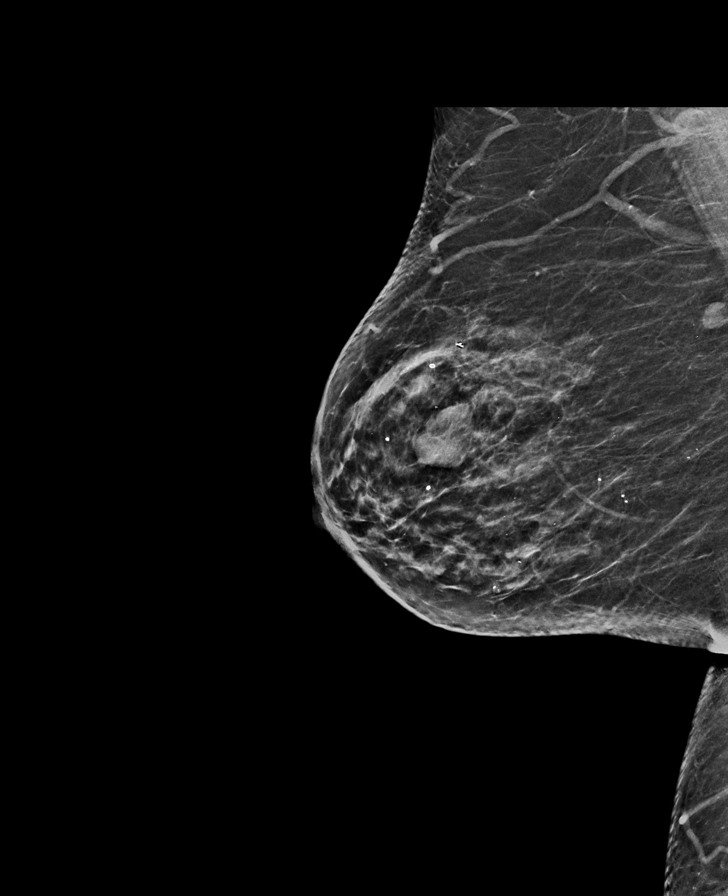

[L CC synth-2D]
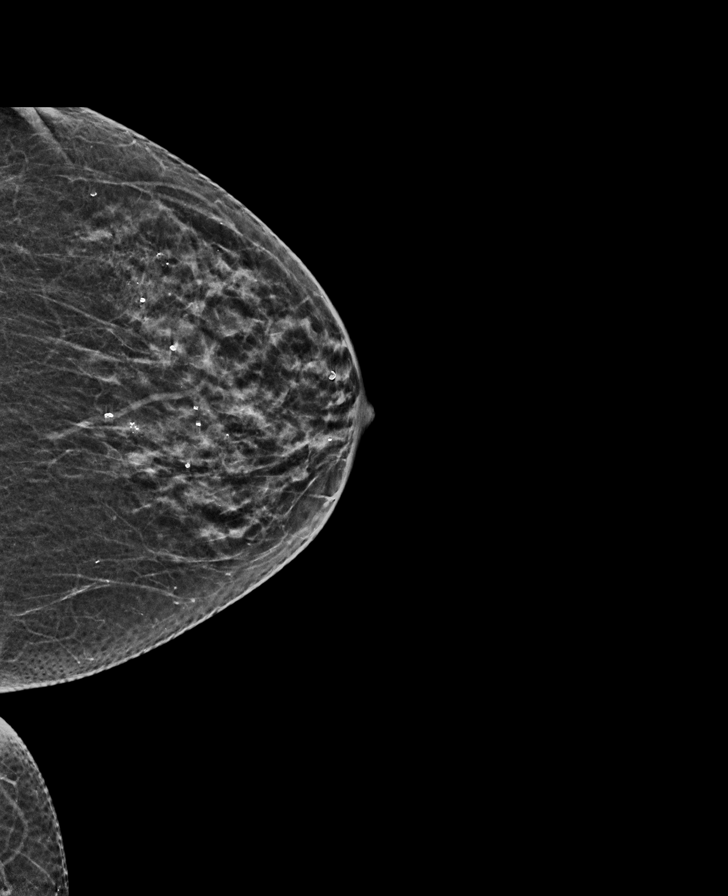

[R CC synth-2D]
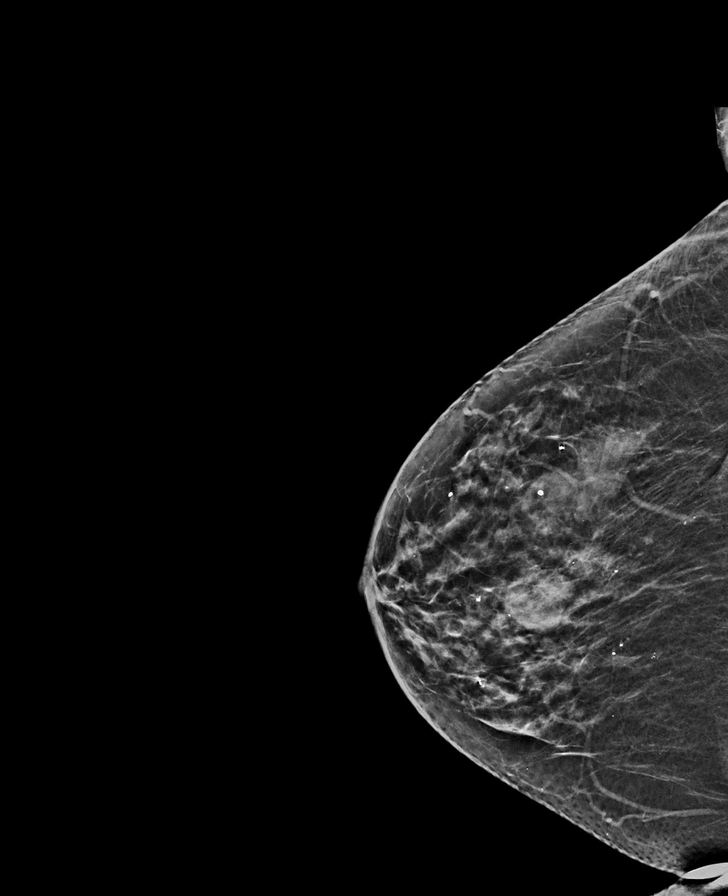

[L CC tomo · tomo slice 24/47.0]
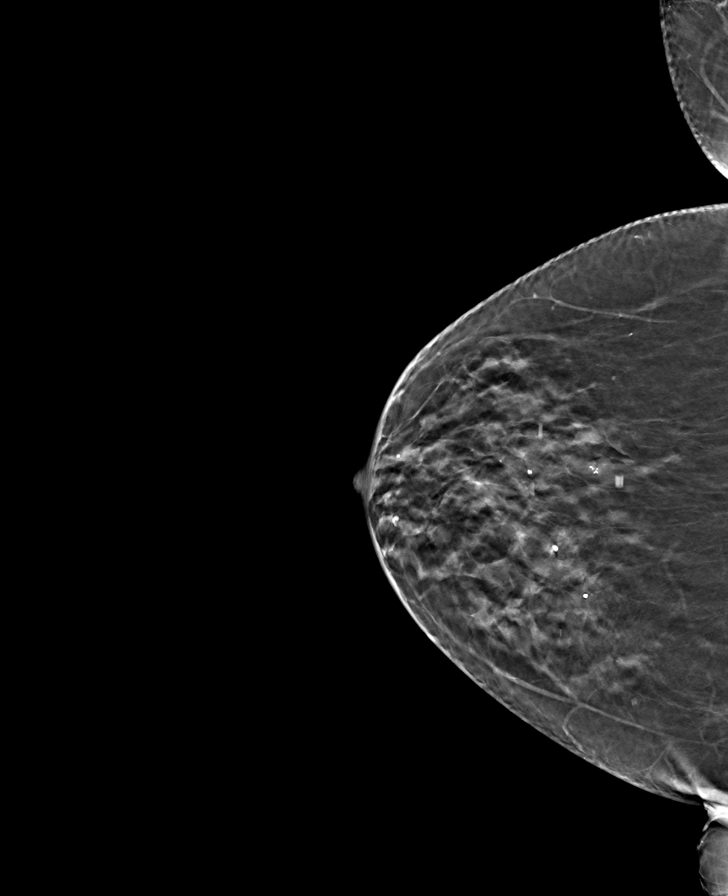

[L MLO tomo · tomo slice 29/57.0]
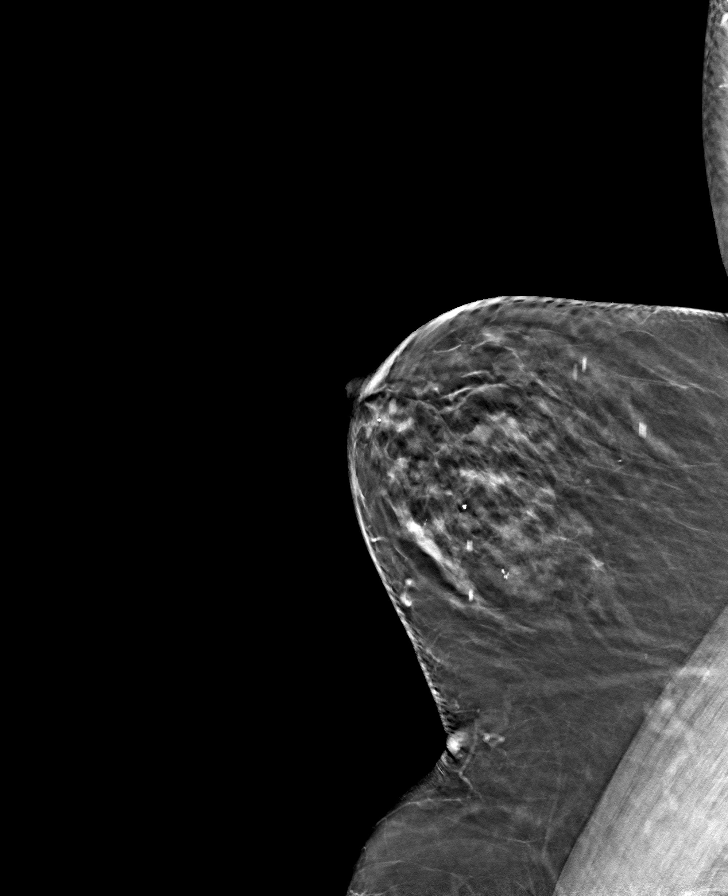

[R MLO tomo · tomo slice 30/59.0]
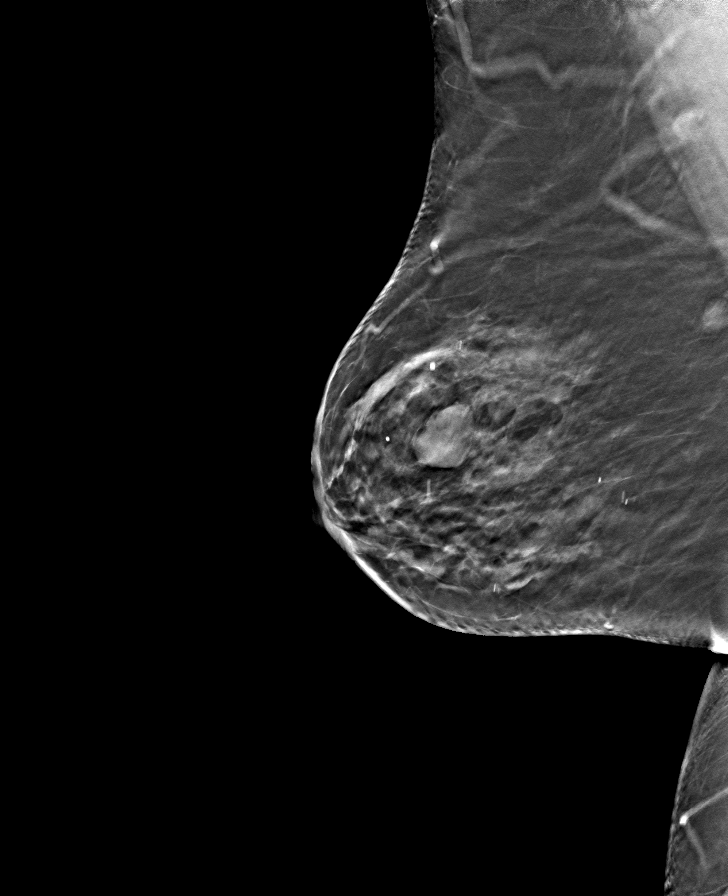

[R CC tomo · tomo slice 27/52.0]
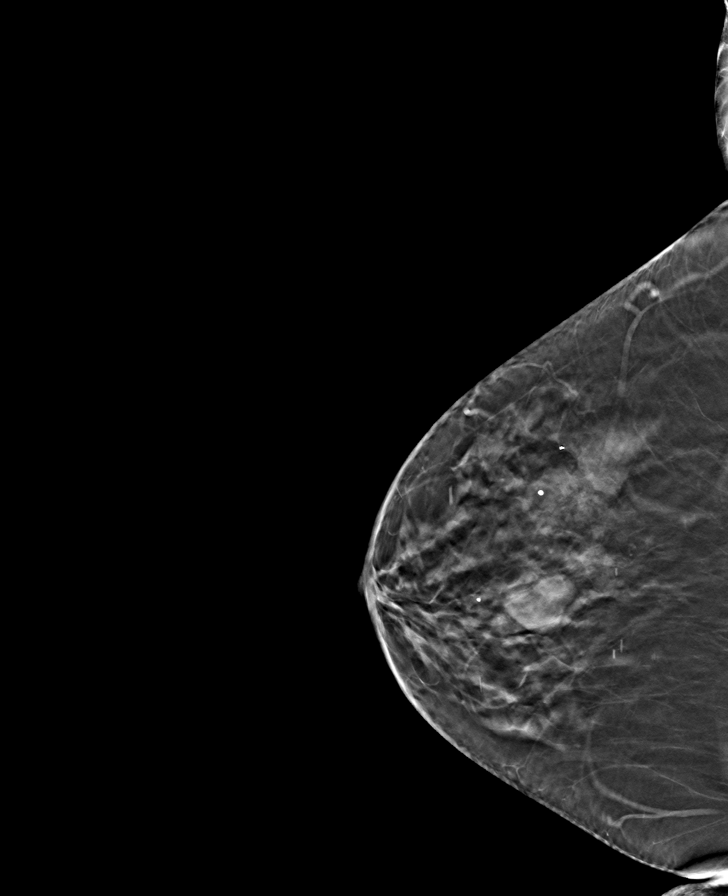

[8 of 24 positions shown; findings below may reference images not displayed]

ACR Breast Density Category c: The breast tissue is heterogeneously
dense, which may obscure small masses.
FINDINGS: There are no findings suspicious for malignancy.
IMPRESSION: No mammographic evidence of malignancy. A result letter of this
screening mammogram will be mailed directly to the patient.

RECOMMENDATION:
Screening mammogram in one year. (Code:Q3-W-BC3)

BI-RADS CATEGORY  1: Negative.

## 2023-08-03 DIAGNOSIS — T782XXA Anaphylactic shock, unspecified, initial encounter: Secondary | ICD-10-CM | POA: Diagnosis not present

## 2023-08-10 ENCOUNTER — Emergency Department
Admission: EM | Admit: 2023-08-10 | Discharge: 2023-08-10 | Disposition: A | Discharge status: Home / Self Care | Attending: Emergency Medicine | Admitting: Emergency Medicine

## 2023-08-10 ENCOUNTER — Other Ambulatory Visit: Payer: Self-pay

## 2023-08-10 ENCOUNTER — Encounter: Payer: Self-pay | Admitting: Emergency Medicine

## 2023-08-10 DIAGNOSIS — Z79899 Other long term (current) drug therapy: Secondary | ICD-10-CM | POA: Diagnosis not present

## 2023-08-10 DIAGNOSIS — Z7982 Long term (current) use of aspirin: Secondary | ICD-10-CM | POA: Insufficient documentation

## 2023-08-10 DIAGNOSIS — I110 Hypertensive heart disease with heart failure: Secondary | ICD-10-CM | POA: Diagnosis not present

## 2023-08-10 DIAGNOSIS — Z85850 Personal history of malignant neoplasm of thyroid: Secondary | ICD-10-CM | POA: Insufficient documentation

## 2023-08-10 DIAGNOSIS — J45909 Unspecified asthma, uncomplicated: Secondary | ICD-10-CM | POA: Diagnosis not present

## 2023-08-10 DIAGNOSIS — T7840XA Allergy, unspecified, initial encounter: Secondary | ICD-10-CM | POA: Diagnosis present

## 2023-08-10 DIAGNOSIS — T783XXA Angioneurotic edema, initial encounter: Secondary | ICD-10-CM | POA: Insufficient documentation

## 2023-08-10 DIAGNOSIS — E119 Type 2 diabetes mellitus without complications: Secondary | ICD-10-CM | POA: Insufficient documentation

## 2023-08-10 DIAGNOSIS — J449 Chronic obstructive pulmonary disease, unspecified: Secondary | ICD-10-CM | POA: Insufficient documentation

## 2023-08-10 DIAGNOSIS — E039 Hypothyroidism, unspecified: Secondary | ICD-10-CM | POA: Insufficient documentation

## 2023-08-10 DIAGNOSIS — I509 Heart failure, unspecified: Secondary | ICD-10-CM | POA: Insufficient documentation

## 2023-08-10 DIAGNOSIS — I2510 Atherosclerotic heart disease of native coronary artery without angina pectoris: Secondary | ICD-10-CM | POA: Insufficient documentation

## 2023-08-10 DIAGNOSIS — Z96651 Presence of right artificial knee joint: Secondary | ICD-10-CM | POA: Diagnosis not present

## 2023-08-10 MED ORDER — METHYLPREDNISOLONE SODIUM SUCC 125 MG IJ SOLR
125.0000 mg | Freq: Once | INTRAMUSCULAR | Status: AC
  Administered 2023-08-10: 125 mg via INTRAVENOUS
  Filled 2023-08-10: qty 2

## 2023-08-10 MED ORDER — EPINEPHRINE 0.15 MG/0.3ML IJ SOAJ
0.1500 mg | Freq: Once | INTRAMUSCULAR | Status: AC
  Administered 2023-08-10: 0.15 mg via INTRAMUSCULAR
  Filled 2023-08-10: qty 0.3

## 2023-08-10 MED ORDER — TRANEXAMIC ACID-NACL 1000-0.7 MG/100ML-% IV SOLN
1000.0000 mg | Freq: Once | INTRAVENOUS | Status: AC
  Administered 2023-08-10: 1000 mg via INTRAVENOUS
  Filled 2023-08-10: qty 100

## 2023-08-10 MED ORDER — PREDNISONE 10 MG (21) PO TBPK: ORAL_TABLET | ORAL | 0 refills | Status: DC

## 2023-08-10 MED ORDER — FAMOTIDINE IN NACL 20-0.9 MG/50ML-% IV SOLN
20.0000 mg | Freq: Once | INTRAVENOUS | Status: AC
  Administered 2023-08-10: 20 mg via INTRAVENOUS
  Filled 2023-08-10: qty 50

## 2023-08-10 MED ORDER — DIPHENHYDRAMINE HCL 50 MG/ML IJ SOLN
25.0000 mg | Freq: Once | INTRAMUSCULAR | Status: AC
  Administered 2023-08-10: 25 mg via INTRAVENOUS
  Filled 2023-08-10: qty 1

## 2023-08-10 NOTE — ED Notes (Signed)
 Pt given water

## 2023-08-10 NOTE — ED Triage Notes (Signed)
 Patient ambulatory to triage with steady gait, without difficulty or distress noted; pt reports awakening hr PTA with swollen tongue; took liquid benadryl  without relief; st hx of same recently with no known cause; denies itching, rash, or diff breathing

## 2023-08-10 NOTE — ED Provider Notes (Signed)
 [facility] Provider Note    Event Date/Time   First MD Initiated Contact with Patient 08/10/23 [phone]     (approximate)   History   Chief Complaint: Allergic Reaction   HPI  Jacqueline Rhodes is a 68 y.o. female with a history of diabetes, hypertension, GERD, recurrent oral angioedema who comes ED complaining of waking up this morning with a swollen tongue.  Noticed at about 6:00 AM.  Took liquid Benadryl  at home without relief.  She has had previous episodes of similar symptoms.  Denies any hives or other rash.  No difficulty breathing.  No pain.  No fever.  No new medications or foods.  Reports that she has had extensive workup with allergy and immunology for different allergic food triggers as well as hereditary/protein deficiency causes.  She is also undergoing extensive evaluation for chronic left neck pain, has had MRI, is following up with specialist.        Past Medical History:  Diagnosis Date   Anemia    Anginal pain (HCC)    Aortic atherosclerosis (HCC)    Asthma    B12 deficiency    Carpal tunnel syndrome    CHF (congestive heart failure) (HCC)    Chronic venous insufficiency    Complete tear of right rotator cuff    COPD (chronic obstructive pulmonary disease) (HCC)    Coronary artery disease 01/07/2005   a.) LHC 01/07/2005 (NSTEMI): LHC 60% pLAD, 70% D1, 30% pLCx, 20% pRCA, 25% dRCA - med mgmt; b.) LHC ([place] ) 01/10/2008: 25/60% pLAD, 70% D1, 20% pLCx, 20% mRCA --> FFR normal --> med mgmt; c.) LHC ([place] ) 01/04/2012:: 40/60% pLAD, 70% D1, 25% pLCx, 20% pRCA, 20% mRCA, 25% dRCA --> FFR normal --> med mgmt; d.) LHC ([place] ) 01/10/2018: 20% pRCA, 50% pLAD, 55% oD1 -- med mgmt   Diverticulosis    DOE (dyspnea on exertion)    GERD (gastroesophageal reflux disease)    History of bilateral cataract extraction 2014   Hyperlipidemia    Hypertension    Hypothyroidism    a.) s/p LEFT hemithyroidectomy; on levothyroxine    Long term current use of  aspirin     Long term current use of immunosuppressive drug    a.) MTX + secukinumab  for RA/psoriatric arthritis diagnoses   Lower extremity edema    Lymphedema    NSTEMI (non-ST elevated myocardial infarction) (HCC) 01/06/2005   a.) LHC 01/07/2005: 60% pLAD, 70% D1, 30% pLCx, 20% pRCA, 25% dRCA -- med mgmt   OSA on CPAP    a.) compliance issues with prescribed nocturnal PAP therapy   Osteoarthritis    Peripheral vascular disease (HCC)    Post-menopausal osteoporosis    Psoriatic arthritis (HCC)    RA (rheumatoid arthritis) (HCC)    Renal insufficiency    T2DM (type 2 diabetes mellitus) (HCC)    Thyroid  cancer (HCC)    a.) s/p LEFT hemithyroidectomy 08/2015    Current Outpatient Rx   Order #: [id number] Class: Normal   Order #: [id number] Class: Historical Med   Order #: [id number] Class: Normal   Order #: [id number] Class: Historical Med   Order #: [id number] Class: Historical Med   Order #: [id number] Class: Historical Med   Order #: [id number] Class: Historical Med   Order #: [id number] Class: Normal   Order #: [id number] Class: Normal   Order #: [id number] Class: Normal   Order #: [id number] Class: Historical Med   Order #: [id number] Class: Historical Med   Order #: [id number] Class: Historical Med   Order #: [id number] Class: Normal  Order #: [id number] Class: Historical Med   Order #: [id number] Class: Historical Med   Order #: [id number] Class: Historical Med   Order #: [id number] Class: Normal   Order #: [id number] Class: Normal   Order #: [id number] Class: Historical Med   Order #: [id number] Class: Normal   Order #: [id number] Class: Historical Med   Order #: [id number] Class: Normal   Order #: [id number] Class: Normal   Order #: [id number] Class: Historical Med    Past Surgical History:  Procedure Laterality Date   ABDOMINAL HYSTERECTOMY  2003   BIOPSY THYROID   2017   x 2   BREAST BIOPSY Left 2000   benign   BREAST BIOPSY Right 2015   benign   CATARACT EXTRACTION, BILATERAL  2014   CORONARY/GRAFT  ACUTE MI REVASCULARIZATION N/A 12/20/2022   Procedure: Coronary/Graft Acute MI Revascularization;  Surgeon: Sammy Crisp, MD;  Location: [facility];  Service: Cardiovascular;  Laterality: N/A;   left ankle surgery  2001   LEFT HEART CATH AND CORONARY ANGIOGRAPHY Left 01/10/2018   Procedure: LEFT HEART CATH AND CORONARY ANGIOGRAPHY;  Surgeon: Michelle Aid, MD;  Location: [facility];  Service: Cardiovascular;  Laterality: Left;   LEFT HEART CATH AND CORONARY ANGIOGRAPHY Left 01/07/2005   Procedure: LEFT HEART CATH AND CORONARY ANGIOGRAPHY; Location: [facility]; Surgeon: Thomasene Flemings, MD   LEFT HEART CATH AND CORONARY ANGIOGRAPHY Left 01/10/2008   Procedure: LEFT HEART CATH AND CORONARY ANGIOGRAPHY; Location: [facility]; Surgeon: Thomasene Flemings, MD / Thais Fill, MD   LEFT HEART CATH AND CORONARY ANGIOGRAPHY Left 01/04/2012   Procedure: LEFT HEART CATH AND CORONARY ANGIOGRAPHY; Location: [facility]; Surgeon: Thomasene Flemings, MD / Percival Brace, MD   LEFT HEART CATH AND CORONARY ANGIOGRAPHY N/A 12/20/2022   Procedure: LEFT HEART CATH AND CORONARY ANGIOGRAPHY;  Surgeon: Sammy Crisp, MD;  Location: [facility];  Service: Cardiovascular;  Laterality: N/A;   left shoulder surgery  1996   right elbow surgery  1999   THYROIDECTOMY, PARTIAL Left 09/05/2015   Procedure: LEFT HEMITHYROIDECTOMY   TOTAL KNEE ARTHROPLASTY Right 05/29/2018   Procedure: TOTAL KNEE ARTHROPLASTY-RIGHT;  Surgeon: Molli Angelucci, MD;  Location: [facility];  Service: Orthopedics;  Laterality: Right;   VEIN SURGERY Bilateral 2015   legs    Physical Exam   Triage Vital Signs: ED Triage Vitals  Encounter Vitals Group     BP 08/10/23 0647 (!) 170/81     Systolic BP Percentile --      Diastolic BP Percentile --      Pulse Rate 08/10/23 0647 79     Resp 08/10/23 0647 17     Temp 08/10/23 0647 98 F (36.7 C)     Temp Source 08/10/23 0647 Oral     SpO2 08/10/23 0647 99 %     Weight 08/10/23 0642  223 lb (101.2 kg)     Height 08/10/23 0642 5\' 4"  (1.626 m)     Head Circumference --      Peak Flow --      Pain Score 08/10/23 0642 0     Pain Loc --      Pain Education --      Exclude from Growth Chart --     Most recent vital signs: Vitals:   08/10/23 1030 08/10/23 1200  BP: 137/66 (!) 152/103  Pulse: (!) 57 65  Resp: 13 15  Temp:    SpO2: 100% 98%    General: Awake, no distress.  CV:  Good peripheral perfusion.  Regular rate and rhythm Resp:  Normal  effort.  Clear to auscultation bilaterally, no wheezing or stridor Abd:  No distention.  Soft nontender Other:  No rash.  Moist oral mucosa.  Edematous tongue.  Clear voice   ED Results / Procedures / Treatments   Labs (all labs ordered are listed, but only abnormal results are displayed) Labs Reviewed - No data to display   EKG    RADIOLOGY    PROCEDURES:  Procedures   MEDICATIONS ORDERED IN ED: Medications  EPINEPHrine  (EPIPEN  JR) injection 0.15 mg (0.15 mg Intramuscular Given 08/10/23 0717)  methylPREDNISolone  sodium succinate (SOLU-MEDROL ) 125 mg/2 mL injection 125 mg (125 mg Intravenous Given 08/10/23 1610)  diphenhydrAMINE  (BENADRYL ) injection 25 mg (25 mg Intravenous Given 08/10/23 0714)  famotidine  (PEPCID ) IVPB 20 mg premix (0 mg Intravenous Stopped 08/10/23 0737)  tranexamic acid  (CYKLOKAPRON ) IVPB 1,000 mg (0 mg Intravenous Stopped 08/10/23 0920)  EPINEPHrine  (EPIPEN  JR) injection 0.15 mg (0.15 mg Intramuscular Given 08/10/23 1029)     IMPRESSION / MDM / ASSESSMENT AND PLAN / ED COURSE  I reviewed the triage vital signs and the nursing notes.  DDx: Angioedema  Patient's presentation is most consistent with acute presentation with potential threat to life or bodily function.  Patient presents with recurrent angioedema involving the tongue.  Will give antihistamines, TXA, steroids, and low-dose epinephrine .  Patient refuses standard dose epinephrine  due to prior episode of acute MI after receiving  epi, for which she had multivessel PCI.  No signs of infection, no respiratory distress.   Clinical Course as of 08/10/23 1225  Thu Aug 10, 2023  1012 Tongue swelling improving. Ctab. Will repeat epi pen junior, continue observing [PS]  1224 Tongue swelling continues improving, feels back to normal, stable for discharge [PS]    Clinical Course User Index [PS] Jacquie Maudlin, MD     FINAL CLINICAL IMPRESSION(S) / ED DIAGNOSES   Final diagnoses:  Angioedema, initial encounter     Rx / DC Orders   ED Discharge Orders          Ordered    predniSONE  (STERAPRED UNI-PAK 21 TAB) 10 MG (21) TBPK tablet        08/10/23 1225             Note:  This document was prepared using Dragon voice recognition software and may include unintentional dictation errors.   Jacquie Maudlin, MD 08/10/23 1225

## 2023-08-14 DIAGNOSIS — M7581 Other shoulder lesions, right shoulder: Secondary | ICD-10-CM | POA: Diagnosis not present

## 2023-08-14 DIAGNOSIS — M75121 Complete rotator cuff tear or rupture of right shoulder, not specified as traumatic: Secondary | ICD-10-CM | POA: Diagnosis not present

## 2023-08-14 DIAGNOSIS — Z9889 Other specified postprocedural states: Secondary | ICD-10-CM | POA: Diagnosis not present

## 2023-08-18 DIAGNOSIS — Z5986 Financial insecurity: Secondary | ICD-10-CM | POA: Diagnosis not present

## 2023-08-18 DIAGNOSIS — Z79899 Other long term (current) drug therapy: Secondary | ICD-10-CM | POA: Diagnosis not present

## 2023-08-18 DIAGNOSIS — L4050 Arthropathic psoriasis, unspecified: Secondary | ICD-10-CM | POA: Diagnosis not present

## 2023-08-21 DIAGNOSIS — Z87898 Personal history of other specified conditions: Secondary | ICD-10-CM | POA: Diagnosis not present

## 2023-08-21 DIAGNOSIS — I872 Venous insufficiency (chronic) (peripheral): Secondary | ICD-10-CM | POA: Diagnosis not present

## 2023-08-21 DIAGNOSIS — I2102 ST elevation (STEMI) myocardial infarction involving left anterior descending coronary artery: Secondary | ICD-10-CM | POA: Diagnosis not present

## 2023-08-21 DIAGNOSIS — E782 Mixed hyperlipidemia: Secondary | ICD-10-CM | POA: Diagnosis not present

## 2023-08-21 DIAGNOSIS — I10 Essential (primary) hypertension: Secondary | ICD-10-CM | POA: Diagnosis not present

## 2023-08-21 DIAGNOSIS — I25118 Atherosclerotic heart disease of native coronary artery with other forms of angina pectoris: Secondary | ICD-10-CM | POA: Diagnosis not present

## 2023-08-21 DIAGNOSIS — G4733 Obstructive sleep apnea (adult) (pediatric): Secondary | ICD-10-CM | POA: Diagnosis not present

## 2023-08-21 DIAGNOSIS — I5022 Chronic systolic (congestive) heart failure: Secondary | ICD-10-CM | POA: Diagnosis not present

## 2023-08-21 DIAGNOSIS — I252 Old myocardial infarction: Secondary | ICD-10-CM | POA: Diagnosis not present

## 2023-08-23 DIAGNOSIS — R202 Paresthesia of skin: Secondary | ICD-10-CM | POA: Diagnosis not present

## 2023-08-23 DIAGNOSIS — M47812 Spondylosis without myelopathy or radiculopathy, cervical region: Secondary | ICD-10-CM | POA: Diagnosis not present

## 2023-08-23 DIAGNOSIS — M542 Cervicalgia: Secondary | ICD-10-CM | POA: Diagnosis not present

## 2023-08-23 DIAGNOSIS — G629 Polyneuropathy, unspecified: Secondary | ICD-10-CM | POA: Diagnosis not present

## 2023-08-23 DIAGNOSIS — Z1331 Encounter for screening for depression: Secondary | ICD-10-CM | POA: Diagnosis not present

## 2023-08-23 DIAGNOSIS — R208 Other disturbances of skin sensation: Secondary | ICD-10-CM | POA: Diagnosis not present

## 2023-09-19 ENCOUNTER — Telehealth: Payer: Self-pay | Admitting: *Deleted

## 2023-09-19 ENCOUNTER — Encounter: Payer: Self-pay | Admitting: Student in an Organized Health Care Education/Training Program

## 2023-09-19 ENCOUNTER — Ambulatory Visit
Attending: Student in an Organized Health Care Education/Training Program | Admitting: Student in an Organized Health Care Education/Training Program

## 2023-09-19 VITALS — BP 154/79 | HR 79 | Temp 97.9°F | Ht 64.0 in | Wt 220.0 lb

## 2023-09-19 DIAGNOSIS — G894 Chronic pain syndrome: Secondary | ICD-10-CM | POA: Insufficient documentation

## 2023-09-19 DIAGNOSIS — M47812 Spondylosis without myelopathy or radiculopathy, cervical region: Secondary | ICD-10-CM | POA: Diagnosis not present

## 2023-09-19 DIAGNOSIS — M5481 Occipital neuralgia: Secondary | ICD-10-CM | POA: Insufficient documentation

## 2023-09-19 NOTE — Patient Instructions (Addendum)
 We need to get cardiac clearance from Jacqueline Rhodes [phone] Cardiology to get clearance to stop Brillinta for 3 days prior to cervical facet blocks. If she says no or if patient is high risk, then we will just plan on doing the occipital nerve block (which she does not need to stop blood thinner for).   ______________________________________________________________________    General Risks and Possible Complications  Patient Responsibilities: It is important that you read this as it is part of your informed consent. It is our duty to inform you of the risks and possible complications associated with treatments offered to you. It is your responsibility as a patient to read this and to ask questions about anything that is not clear or that you believe was not covered in this document.  Patient's Rights: You have the right to refuse treatment. You also have the right to change your mind, even after initially having agreed to have the treatment done. However, under this last option, if you wait until the last second to change your mind, you may be charged for the materials used up to that point.  Introduction: Medicine is not an Visual merchandiser. Everything in Medicine, including the lack of treatment(s), carries the potential for danger, harm, or loss (which is by definition: Risk). In Medicine, a complication is a secondary problem, condition, or disease that can aggravate an already existing one. All treatments carry the risk of possible complications. The fact that a side effects or complications occurs, does not imply that the treatment was conducted incorrectly. It must be clearly understood that these can happen even when everything is done following the highest safety standards.  No treatment: You can choose not to proceed with the proposed treatment alternative. The "PRO(s)" would include: avoiding the risk of complications associated with the therapy. The "CON(s)" would include: not  getting any of the treatment benefits. These benefits fall under one of three categories: diagnostic; therapeutic; and/or palliative. Diagnostic benefits include: getting information which can ultimately lead to improvement of the disease or symptom(s). Therapeutic benefits are those associated with the successful treatment of the disease. Finally, palliative benefits are those related to the decrease of the primary symptoms, without necessarily curing the condition (example: decreasing the pain from a flare-up of a chronic condition, such as incurable terminal cancer).  General Risks and Complications: These are associated to most interventional treatments. They can occur alone, or in combination. They fall under one of the following six (6) categories: no benefit or worsening of symptoms; bleeding; infection; nerve damage; allergic reactions; and/or death. No benefits or worsening of symptoms: In Medicine there are no guarantees, only probabilities. No healthcare provider can ever guarantee that a medical treatment will work, they can only state the probability that it may. Furthermore, there is always the possibility that the condition may worsen, either directly, or indirectly, as a consequence of the treatment. Bleeding: This is more common if the patient is taking a blood thinner, either prescription or over the counter (example: Goody Powders, Fish oil, Aspirin , Garlic, etc.), or if suffering a condition associated with impaired coagulation (example: Hemophilia, cirrhosis of the liver, low platelet counts, etc.). However, even if you do not have one on these, it can still happen. If you have any of these conditions, or take one of these drugs, make sure to notify your treating physician. Infection: This is more common in patients with a compromised immune system, either due to disease (example: diabetes, cancer, human immunodeficiency virus [  HIV], etc.), or due to medications or treatments (example:  therapies used to treat cancer and rheumatological diseases). However, even if you do not have one on these, it can still happen. If you have any of these conditions, or take one of these drugs, make sure to notify your treating physician. Nerve Damage: This is more common when the treatment is an invasive one, but it can also happen with the use of medications, such as those used in the treatment of cancer. The damage can occur to small secondary nerves, or to large primary ones, such as those in the spinal cord and brain. This damage may be temporary or permanent and it may lead to impairments that can range from temporary numbness to permanent paralysis and/or brain death. Allergic Reactions: Any time a substance or material comes in contact with our body, there is the possibility of an allergic reaction. These can range from a mild skin rash (contact dermatitis) to a severe systemic reaction (anaphylactic reaction), which can result in death. Death: In general, any medical intervention can result in death, most of the time due to an unforeseen complication. ______________________________________________________________________      ______________________________________________________________________    Preparing for your procedure  Appointments: If you think you may not be able to keep your appointment, call 24-48 hours in advance to cancel. We need time to make it available to others.  Procedure visits are for procedures only. During your procedure appointment there will be: NO Prescription Refills*. NO medication changes or discussions*. NO discussion of disability issues*. NO unrelated pain problem evaluations*. NO evaluations to order other pain procedures*. *These will be addressed at a separate and distinct evaluation encounter on the provider's evaluation schedule and not during procedure days.  Instructions: Food intake: Avoid eating anything solid for at least 8 hours prior to  your procedure. Clear liquid intake: You may take clear liquids such as water up to 2 hours prior to your procedure. (No carbonated drinks. No soda.) Transportation: Unless otherwise stated by your physician, bring a driver. (Driver cannot be a Market researcher, Pharmacist, community, or any other form of public transportation.) Morning Medicines: Except for blood thinners, take all of your other morning medications with a sip of water. Make sure to take your heart and blood pressure medicines. If your blood pressure's lower number is above 100, the case will be rescheduled. Blood thinners: Make sure to stop your blood thinners as instructed.  If you take a blood thinner, but were not instructed to stop it, call our office [phone] and ask to talk to a nurse. Not stopping a blood thinner prior to certain procedures could lead to serious complications. Diabetics on insulin : Notify the staff so that you can be scheduled 1st case in the morning. If your diabetes requires high dose insulin , take only  of your normal insulin  dose the morning of the procedure and notify the staff that you have done so. Preventing infections: Shower with an antibacterial soap the morning of your procedure.  Build-up your immune system: Take 1000 mg of Vitamin C with every meal (3 times a day) the day prior to your procedure. Antibiotics: Inform the nursing staff if you are taking any antibiotics or if you have any conditions that may require antibiotics prior to procedures. (Example: recent joint implants)   Pregnancy: If you are pregnant make sure to notify the nursing staff. Not doing so may result in injury to the fetus, including death.  Sickness: If you have a cold, fever,  or any active infections, call and cancel or reschedule your procedure. Receiving steroids while having an infection may result in complications. Arrival: You must be in the facility at least 30 minutes prior to your scheduled procedure. Tardiness: Your scheduled time is also  the cutoff time. If you do not arrive at least 15 minutes prior to your procedure, you will be rescheduled.  Children: Do not bring any children with you. Make arrangements to keep them home. Dress appropriately: There is always a possibility that your clothing may get soiled. Avoid long dresses. Valuables: Do not bring any jewelry or valuables.  Reasons to call and reschedule or cancel your procedure: (Following these recommendations will minimize the risk of a serious complication.) Surgeries: Avoid having procedures within 2 weeks of any surgery. (Avoid for 2 weeks before or after any surgery). Flu Shots: Avoid having procedures within 2 weeks of a flu shots or . (Avoid for 2 weeks before or after immunizations). Barium: Avoid having a procedure within 7-10 days after having had a radiological study involving the use of radiological contrast. (Myelograms, Barium swallow or enema study). Heart attacks: Avoid any elective procedures or surgeries for the initial 6 months after a Myocardial Infarction (Heart Attack). Blood thinners: It is imperative that you stop these medications before procedures. Let us  know if you if you take any blood thinner.  Infection: Avoid procedures during or within two weeks of an infection (including chest colds or gastrointestinal problems). Symptoms associated with infections include: Localized redness, fever, chills, night sweats or profuse sweating, burning sensation when voiding, cough, congestion, stuffiness, runny nose, sore throat, diarrhea, nausea, vomiting, cold or Flu symptoms, recent or current infections. It is specially important if the infection is over the area that we intend to treat. Heart and lung problems: Symptoms that may suggest an active cardiopulmonary problem include: cough, chest pain, breathing difficulties or shortness of breath, dizziness, ankle swelling, uncontrolled high or unusually low blood pressure, and/or palpitations. If you are  experiencing any of these symptoms, cancel your procedure and contact your primary care physician for an evaluation.  Remember:  Regular Business hours are:  Monday to Thursday 8:00 AM to 4:00 PM  Provider's Schedule: Eric Como, MD:  Procedure days: Tuesday and Thursday 7:30 AM to 4:00 PM  Wallie Sherry, MD:  Procedure days: Monday and Wednesday 7:30 AM to 4:00 PM Last  Updated: 02/21/2023 ______________________________________________________________________      ______________________________________________________________________    Blood Thinners  IMPORTANT NOTICE:  If you take any of these, make sure to notify the nursing staff.  Failure to do so may result in serious injury.  Recommended time intervals to stop and restart blood-thinners, before & after invasive procedures  Generic Name Brand Name Pre-procedure: Stop medication for this amount of time before your procedure: Post-procedure: Wait this amount of time after the procedure before restarting your medication:  Abciximab Reopro 15 days 2 hrs  Alteplase Activase 10 days 10 days  Anagrelide Agrylin    Apixaban Eliquis 3 days 6 hrs  Cilostazol Pletal 3 days 5 hrs  Clopidogrel Plavix 7-10 days 2 hrs  Dabigatran Pradaxa 5 days 6 hrs  Dalteparin Fragmin 24 hours 4 hrs  Dipyridamole Aggrenox 11days 2 hrs  Edoxaban Lixiana; Savaysa 3 days 2 hrs  Enoxaparin   Lovenox  24 hours 4 hrs  Eptifibatide Integrillin 8 hours 2 hrs  Fondaparinux  Arixtra 72 hours 12 hrs  Hydroxychloroquine Plaquenil 11 days   Prasugrel Effient 7-10 days 6 hrs  Reteplase Retavase 10  days 10 days  Rivaroxaban Xarelto 3 days 6 hrs  Ticagrelor  Brilinta  5-7 days 6 hrs  Ticlopidine Ticlid 10-14 days 2 hrs  Tinzaparin Innohep 24 hours 4 hrs  Tirofiban Aggrastat 8 hours 2 hrs  Warfarin Coumadin 5 days 2 hrs   Other medications with blood-thinning effects  NOTE: Consider stopping these if you have prolonged bleeding despite not taking any  of the above blood thinners. Otherwise ask your provider and this will be decided on a case-by-case basis.  Product indications Generic (Brand) names Note  Cholesterol Lipitor  Stop 4 days before procedure  Blood thinner (injectable) Heparin  (LMW or LMWH Heparin ) Stop 24 hours before procedure  Cancer Ibrutinib (Imbruvica) Stop 7 days before procedure  Malaria/Rheumatoid Hydroxychloroquine (Plaquenil) Stop 11 days before procedure  Thrombolytics  10 days before or after procedures   Over-the-counter (OTC) Products with blood-thinning effects  NOTE: Consider stopping these if you have prolonged bleeding despite not taking any of the above blood thinners. Otherwise ask your provider and this will be decided on a case-by-case basis.  Product Common names Stop Time  Aspirin  > 325 mg Goody Powders, Excedrin, etc. 11 days  Aspirin  <= 81 mg  7 days  Fish oil  4 days  Garlic supplements  7 days  Ginkgo biloba  36 hours  Ginseng  24 hours  NSAIDs Ibuprofen, Naprosyn, etc. 3 days  Vitamin E  4 days   ______________________________________________________________________     Facet Blocks Patient Information  Description: The facets are joints in the spine between the vertebrae.  Like any joints in the body, facets can become irritated and painful.  Arthritis can also effect the facets.  By injecting steroids and local anesthetic in and around these joints, we can temporarily block the nerve supply to them.  Steroids act directly on irritated nerves and tissues to reduce selling and inflammation which often leads to decreased pain.  Facet blocks may be done anywhere along the spine from the neck to the low back depending upon the location of your pain.   After numbing the skin with local anesthetic (like Novocaine), a small needle is passed onto the facet joints under x-ray guidance.  You may experience a sensation of pressure while this is being done.  The entire block usually lasts about 15-25  minutes.   Conditions which may be treated by facet blocks:  Low back/buttock pain Neck/shoulder pain Certain types of headaches  Preparation for the injection:  Do not eat any solid food or dairy products within 8 hours of your appointment. You may drink clear liquid up to 3 hours before appointment.  Clear liquids include water, black coffee, juice or soda.  No milk or cream please. You may take your regular medication, including pain medications, with a sip of water before your appointment.  Diabetics should hold regular insulin  (if taken separately) and take 1/2 normal NPH dose the morning of the procedure.  Carry some sugar containing items with you to your appointment. A driver must accompany you and be prepared to drive you home after your procedure. Bring all your current medications with you. An IV may be inserted and sedation may be given at the discretion of the physician. A blood pressure cuff, EKG and other monitors will often be applied during the procedure.  Some patients may need to have extra oxygen administered for a short period. You will be asked to provide medical information, including your allergies and medications, prior to the procedure.  We must know  immediately if you are taking blood thinners (like Coumadin/Warfarin) or if you are allergic to IV iodine contrast (dye).  We must know if you could possible be pregnant.  Possible side-effects:  Bleeding from needle site Infection (rare, may require surgery) Nerve injury (rare) Numbness & tingling (temporary) Difficulty urinating (rare, temporary) Spinal headache (a headache worse with upright posture) Light-headedness (temporary) Pain at injection site (serveral days) Decreased blood pressure (rare, temporary) Weakness in arm/leg (temporary) Pressure sensation in back/neck (temporary)   Call if you experience:  Fever/chills associated with headache or increased back/neck pain Headache worsened by an  upright position New onset, weakness or numbness of an extremity below the injection site Hives or difficulty breathing (go to the emergency room) Inflammation or drainage at the injection site(s) Severe back/neck pain greater than usual New symptoms which are concerning to you  Please note:  Although the local anesthetic injected can often make your back or neck feel good for several hours after the injection, the pain will likely return. It takes 3-7 days for steroids to work.  You may not notice any pain relief for at least one week.  If effective, we will often do a series of 2-3 injections spaced 3-6 weeks apart to maximally decrease your pain.  After the initial series, you may be a candidate for a more permanent nerve block of the facets.  If you have any questions, please call #[phone] [facility]

## 2023-09-19 NOTE — Progress Notes (Signed)
 PROVIDER NOTE: Interpretation of information contained herein should be left to medically-trained personnel. Specific patient instructions are provided elsewhere under Patient Instructions section of medical record. This document was created in part using AI and STT-dictation technology, any transcriptional errors that may result from this process are unintentional.  Patient: Jacqueline Rhodes  Service: E/M Encounter  Provider: Wallie Sherry, MD  DOB: [birth year]  Delivery: Face-to-face  Specialty: Interventional Pain Management  MRN: [id number]  Setting: Ambulatory outpatient facility  Specialty designation: 09  Type: New Patient  Location: Outpatient office facility  PCP: Marikay Eva POUR, PA  DOS: 09/19/2023    Referring Prov.: Dodson Delon FERNS, MD   Primary Reason(s) for Visit: Encounter for initial evaluation of one or more chronic problems (new to examiner) potentially causing chronic pain, and posing a threat to normal musculoskeletal function. (Level of risk: High) CC: Neck Pain (Neck pain since July 2024 worsened November 2024 Left side.  )  HPI  Jacqueline Rhodes is a 68 y.o. year old, female patient, who comes for the first time to our practice referred by Dodson Delon I, MD for our initial evaluation of her chronic pain. She has Diabetes mellitus without complication (HCC); Hypertension; Pain in limb; Swelling of limb; Chronic venous insufficiency; Lymphedema; Angina decubitus (HCC); S/P TKR (total knee replacement) using cement, right; Angioedema; Asthma, chronic; Hypothyroidism; GERD (gastroesophageal reflux disease); Rheumatoid arthritis (HCC); CAD (coronary artery disease); STEMI involving left anterior descending coronary artery (HCC); GERD without esophagitis; Obesity, Class III, BMI 40-49.9 (morbid obesity); Hypokalemia; Occipital neuralgia of left side; Cervical facet joint syndrome; and Chronic pain syndrome on their problem list. Today she comes in for evaluation of her Neck Pain  (Neck pain since July 2024 worsened November 2024 Left side.  )  Pain Assessment: Location:    Neck and occipital region Radiating:  Radiates to left occiput Onset:  Greater than 1 year ago Duration:  Throughout the day, worse with lateral rotation Quality:  Stabbing, sharp, aching Severity: 7 /10 (subjective, self-reported pain score)  Effect on ADL:  Limits ability to perform ADLs comfortably Timing:  Worse in the evening and after more cervical motion Modifying factors:   BP: (!) 154/79  HR: 79  Onset and Duration: Date of onset: 09/2022 Cause of pain: Unknown Severity: Getting worse Timing: Not influenced by the time of the day Aggravating Factors: Bending, Lifiting, Twisting, and Walking Alleviating Factors: Nothing Associated Problems: Nausea, Numbness, Tingling, Pain that wakes patient up, and Pain that does not allow patient to sleep Quality of Pain: Aching, Constant, Deep, Getting longer, Pressure-like, Sharp, Shooting, Throbbing, Tingling, and Uncomfortable Previous Examinations or Tests: CT scan and MRI scan Previous Treatments: The patient denies having any treatment that Helps  Ms. Odom is being evaluated for possible interventional pain management therapies for the treatment of her chronic pain.   Discussed the use of AI scribe software for clinical note transcription with the patient, who gave verbal consent to proceed.  History of Present Illness   Jacqueline Rhodes is a 68 year old female with arthritis who presents with neck pain. She was referred by Dr. Dodson for evaluation of her neck pain.  She has been experiencing neck pain since July of last year, which began during physical therapy following left shoulder surgery for tendon and bicep repair. The pain has progressively worsened, particularly since November.  The neck pain is described as a dull ache that intensifies with movement, especially when turning her head. It sometimes radiates to her ear,  causing a sensation similar to an earache, and is accompanied by headaches located at the back of her head. Lying down exacerbates the pain.  Multiple MRIs and CT scans have revealed arthritis in the cervical region, particularly affecting the C1 and C2 vertebrae.  She has tried various pain medications, including Tylenol  and Advil, but they are ineffective. She is allergic to Vicodin and has a history of adverse reactions to certain medications, including a heart attack triggered by an EpiPen .  In addition to neck pain, she has a history of angioedema, which first occurred in July of last year, leading to an emergency room visit. She experienced a heart attack in October after using an EpiPen , resulting in stent placement. She is currently on Brilinta , a blood thinner.        Meds   Current Outpatient Medications:    albuterol  (PROVENTIL  HFA;VENTOLIN  HFA) 108 (90 Base) MCG/ACT inhaler, Inhale 2 puffs into the lungs every 6 (six) hours as needed for wheezing or shortness of breath. , Disp: , Rfl:    aspirin  81 MG chewable tablet, Chew 1 tablet (81 mg total) by mouth daily., Disp: 30 tablet, Rfl: 0   Calcium  Fructoborate POWD, 20 mcg by Does not apply route. Cream, Disp: , Rfl:    Cetirizine HCl 10 MG CAPS, Take 10 mg by mouth daily. , Disp: , Rfl:    Cholecalciferol 25 MCG (1000 UT) TBDP, Take 125 mcg by mouth., Disp: , Rfl:    Coenzyme Q10 (COQ-10) 100 MG CAPS, Take 100 mg by mouth daily. , Disp: , Rfl:    EPINEPHrine  (EPIPEN  JR 2-PAK) 0.15 MG/0.3ML injection, Inject 0.15 mg into the muscle as needed for anaphylaxis., Disp: 2 each, Rfl: 0   ezetimibe  (ZETIA ) 10 MG tablet, Take 1 tablet (10 mg total) by mouth daily., Disp: 30 tablet, Rfl: 0   famotidine  (PEPCID ) 20 MG tablet, Take 1 tablet (20 mg total) by mouth 2 (two) times daily for 3 days., Disp: 6 tablet, Rfl: 0   fluticasone  furoate-vilanterol (BREO ELLIPTA ) 100-25 MCG/INH AEPB, Inhale 1 puff into the lungs daily. , Disp: , Rfl:     folic acid  (FOLVITE ) 1 MG tablet, Take 1 mg by mouth daily., Disp: , Rfl:    furosemide  (LASIX ) 20 MG tablet, Take 20 mg by mouth daily as needed for fluid or edema., Disp: , Rfl:    isosorbide  mononitrate (IMDUR ) 60 MG 24 hr tablet, Take 2 tablets (120 mg total) by mouth daily., Disp: 60 tablet, Rfl: 0   levothyroxine  (SYNTHROID ) 150 MCG tablet, Take 150 mcg by mouth daily before breakfast., Disp: , Rfl:    meclizine (ANTIVERT) 25 MG tablet, Take 25 mg by mouth 3 (three) times daily as needed for dizziness., Disp: , Rfl:    methotrexate (RHEUMATREX) 2.5 MG tablet, Take 20 mg by mouth every Thursday. , Disp: , Rfl:    metoprolol  succinate (TOPROL -XL) 25 MG 24 hr tablet, Take 1 tablet (25 mg total) by mouth 2 (two) times daily., Disp: 60 tablet, Rfl: 0   nitroGLYCERIN  (NITROSTAT ) 0.4 MG SL tablet, Place 1 tablet (0.4 mg total) under the tongue once every 5 (five) minutes as needed for chest pain. (Max of 3 doses in 15 minutes)., Disp: 25 tablet, Rfl: 0   pantoprazole  (PROTONIX ) 40 MG tablet, Take 40 mg by mouth 2 (two) times daily., Disp: , Rfl:    pravastatin  (PRAVACHOL ) 20 MG tablet, Take 1 tablet (20 mg total) by mouth daily., Disp: 30 tablet, Rfl: 0  predniSONE  (STERAPRED UNI-PAK 21 TAB) 10 MG (21) TBPK tablet, 6 tablets on day 1, then 5 tablets on day 2, then 4 tablets on day 3, then 3 tablets on day 4, then 2 tablets on day 5, then 1 tablet on day 6., Disp: 21 tablet, Rfl: 0   Secukinumab  (COSENTYX  SENSOREADY 300 DOSE) 150 MG/ML SOAJ, Inject 300 mg into the skin every 28 (twenty-eight) days. , Disp: , Rfl:    spironolactone  (ALDACTONE ) 25 MG tablet, Take 0.5 tablet (12.5 mg total) by mouth daily., Disp: 15 tablet, Rfl: 0   ticagrelor  (BRILINTA ) 90 MG TABS tablet, Take 1 tablet (90 mg total) by mouth 2 (two) times daily., Disp: 60 tablet, Rfl: 0   UNABLE TO FIND, Take 1.5 drops by mouth daily at 2 PM. Med Name: B12  5,000 unit, B6 3400 unit B1  and Niacin, Disp: , Rfl:   Imaging Review   MR  CERVICAL SPINE W WO CONTRAST  Narrative CLINICAL DATA:  Left-sided neck pain beginning in July 2024 with subsequent worsening. History of thyroid  cancer.  EXAM: MRI CERVICAL SPINE WITHOUT AND WITH CONTRAST  TECHNIQUE: Multiplanar and multiecho pulse sequences of the cervical spine, to include the craniocervical junction and cervicothoracic junction, were obtained without and with intravenous contrast.  CONTRAST:  10 cc Vueway   COMPARISON:  03/19/2009 MRI.  04/19/2023 CT.  FINDINGS: Alignment: Minimal scoliotic curvature.  No listhesis.  Vertebrae: No fracture or focal bone lesion. No edematous endplate marrow changes or edematous facet arthritis. Edematous change at the left-sided articulation of C1 and C2 which would likely be painful.  Cord: No cord compression or focal cord lesion.  Posterior Fossa, vertebral arteries, paraspinal tissues: Negative other than aberrant origin of the right subclavian artery as the last vessel from the arch.  Disc levels:  The foramen magnum is widely patent. There is ordinary mild osteoarthritis of the C1-2 articulation but no encroachment upon the neural structures. Osteoarthritis of the C1-C2 articulation in the left side shows edema and enhancement which would likely relate to left-sided craniocervical pain syndromes.  C2-3: Minimal disc bulge.  No stenosis.  C3-4: Normal interspace.  C4-5: Endplate osteophytes and mild bulging of the disc. Mild facet osteoarthritis. No compressive canal or foraminal narrowing.  C5-6: Small endplate osteophytes. Facet degeneration on the right without edema. No canal stenosis. Mild right foraminal narrowing.  C6-7: Normal interspace.  C7-T1: Minimal disc bulge.  No stenosis.  IMPRESSION: 1. Osteoarthritis of the C1-C2 articulation on the left with edema and enhancement. This would likely be painful. 2. Mild degenerative changes at C4-5 and C5-6. Mild right foraminal narrowing at C5-6. 3.  No evidence of metastatic disease.   Electronically Signed By: Oneil Officer M.D. On: 08/02/2023 13:16   MR SHOULDER RIGHT WO CONTRAST  Narrative CLINICAL DATA:  Shoulder pain, chronic, inflammatory arthritis suspected, xray done  History of rotator cuff tear with surgical repair, subacromial decompression and biceps tenodesis 08/02/2022.  EXAM: MRI OF THE RIGHT SHOULDER WITHOUT CONTRAST  TECHNIQUE: Multiplanar, multisequence MR imaging of the shoulder was performed. No intravenous contrast was administered.  COMPARISON:  Right shoulder MRI 07/02/2022.  FINDINGS: Technical note. Image quality for the current examination is much better than the previous study. Comparison limited by motion on the prior study.  Rotator cuff: Interval postsurgical changes consistent with rotator cuff repair. No displaced anchor pins are identified. However, there is a recurrent full-thickness, moderately retracted tear of the supraspinatus tendon. The infraspinatus tendon appears intact with mild tendinosis. No  significant abnormality of the subscapularis or teres minor tendons identified.  Muscles: Grossly stable mild atrophy of the supraspinatus and infraspinatus muscles.  Biceps long head: Interval bicipital tenodesis. The biceps tendon distal to anchor screws in the bicipital groove is not well seen on the sagittal or coronal images, although a small tendon is visualized on the axial images.  Acromioclavicular Joint: Interval distal clavicle resection and subacromial decompression. No osseous encroachment on the rotator cuff passageway. Fluid extends from the shoulder joint into the subacromial space via the recurrent full-thickness rotator cuff tear.  Glenohumeral Joint: Mild glenohumeral degenerative changes without focal chondral defect. Small shoulder joint effusion.  Labrum: Probable surgical debridement of the superior labrum.No evidence of labral tear or paralabral  cyst.  Bones: There is marrow edema anteriorly in the humeral metaphysis which may relate to the biceps tenodesis screws. Cannot exclude a stress fracture. No other acute osseous findings are identified. Additional postsurgical changes as described above.  Other: No significant soft tissue findings.  IMPRESSION: 1. Recurrent full-thickness, moderately retracted tear of the supraspinatus tendon status post rotator cuff repair. 2. Interval bicipital tenodesis. The biceps tendon distal to the tenodesis screws appears intact on the axial images. 3. Marrow edema anteriorly in the humeral metaphysis may relate to the biceps tenodesis screws. Cannot exclude a stress fracture. 4. Mild glenohumeral degenerative changes. 5. Additional postsurgical changes as described.   Electronically Signed By: Elsie Perone M.D. On: 11/12/2022 09:24  DG Knee 1-2 Views Right  Narrative CLINICAL DATA:  68 year old female status post knee surgery.  EXAM: RIGHT KNEE - 1-2 VIEW  COMPARISON:  None.  FINDINGS: AP and cross-table lateral views at 0924 hours. Right total knee arthroplasty. Hardware appears intact and normally aligned. Anterior wound VAC in place with skin staples. Small air-fluid level within the joint space. No unexpected osseous changes.  IMPRESSION: Right total knee arthroplasty with no adverse features.   Electronically Signed By: VEAR Hurst M.D. On: 05/29/2018 09:48  K Complexity Note: Imaging results reviewed.                         ROS  Cardiovascular: Heart trouble, Daily Aspirin  intake, High blood pressure, Heart attack ( Date: 2006,2024), Heart catheterization, and Blood thinners:  Anticoagulant Pulmonary or Respiratory: Wheezing and difficulty taking a deep full breath (Asthma) and Shortness of breath Neurological: No reported neurological signs or symptoms such as seizures, abnormal skin sensations, urinary and/or fecal incontinence, being born with an abnormal open  spine and/or a tethered spinal cord Psychological-Psychiatric: No reported psychological or psychiatric signs or symptoms such as difficulty sleeping, anxiety, depression, delusions or hallucinations (schizophrenial), mood swings (bipolar disorders) or suicidal ideations or attempts Gastrointestinal: Heartburn due to stomach pushing into lungs (Hiatal hernia) Genitourinary: No reported renal or genitourinary signs or symptoms such as difficulty voiding or producing urine, peeing blood, non-functioning kidney, kidney stones, difficulty emptying the bladder, difficulty controlling the flow of urine, or chronic kidney disease Hematological: Brusing easily Endocrine: High blood sugar controlled without the use of insulin  (NIDDM) and High thyroid  Rheumatologic: Joint aches and or swelling due to excess weight (Osteoarthritis) Musculoskeletal: Negative for myasthenia gravis, muscular dystrophy, multiple sclerosis or malignant hyperthermia Work History: Retired  Allergies  Ms. Mesa is allergic to estrogens, losartan , codeine, flexeril [cyclobenzaprine], penicillins, terfenadine, and vicodin [hydrocodone-acetaminophen ].  Laboratory Chemistry Profile   Renal Lab Results  Component Value Date   BUN 20 06/16/2023   CREATININE 1.04 (H) 06/16/2023   BCR  20 11/29/2016   GFRAA >60 05/30/2018   GFRNONAA 58 (L) 06/16/2023   SPECGRAV 1.015 12/29/2016   PHUR 7.0 12/29/2016   PROTEINUR NEGATIVE 05/16/2018     Electrolytes Lab Results  Component Value Date   NA 138 06/16/2023   K 3.5 06/16/2023   CL 100 06/16/2023   CALCIUM  9.3 06/16/2023     Hepatic Lab Results  Component Value Date   AST 16 06/16/2023   ALT 15 06/16/2023   ALBUMIN 3.6 06/16/2023   ALKPHOS 66 06/16/2023     ID Lab Results  Component Value Date   HIV Non Reactive 11/11/2022   STAPHAUREUS NEGATIVE 05/16/2018   MRSAPCR NEGATIVE 05/16/2018     Bone No results found for: VD25OH, CI874NY7UNU, CI6874NY7,  CI7874NY7, 25OHVITD1, 25OHVITD2, 25OHVITD3, TESTOFREE, TESTOSTERONE   Endocrine Lab Results  Component Value Date   GLUCOSE 117 (H) 06/16/2023   GLUCOSEU NEGATIVE 05/16/2018   HGBA1C 6.3 (H) 11/11/2022     Neuropathy Lab Results  Component Value Date   HGBA1C 6.3 (H) 11/11/2022   HIV Non Reactive 11/11/2022     CNS No results found for: COLORCSF, APPEARCSF, RBCCOUNTCSF, WBCCSF, POLYSCSF, LYMPHSCSF, EOSCSF, PROTEINCSF, GLUCCSF, JCVIRUS, CSFOLI, IGGCSF, LABACHR, ACETBL   Inflammation (CRP: Acute  ESR: Chronic) Lab Results  Component Value Date   ESRSEDRATE 17 05/16/2018     Rheumatology No results found for: RF, ANA, LABURIC, URICUR, LYMEIGGIGMAB, LYMEABIGMQN, HLAB27   Coagulation Lab Results  Component Value Date   INR 1.0 05/16/2018   LABPROT 13.3 05/16/2018   APTT 28 05/16/2018   PLT 239 06/16/2023     Cardiovascular Lab Results  Component Value Date   HGB 13.8 06/16/2023   HCT 41.2 06/16/2023     Screening Lab Results  Component Value Date   STAPHAUREUS NEGATIVE 05/16/2018   MRSAPCR NEGATIVE 05/16/2018   HIV Non Reactive 11/11/2022     Cancer No results found for: CEA, CA125, LABCA2   Allergens No results found for: ALMOND, APPLE, ASPARAGUS, AVOCADO, BANANA, BARLEY, BASIL, BAYLEAF, GREENBEAN, LIMABEAN, WHITEBEAN, BEEFIGE, REDBEET, BLUEBERRY, BROCCOLI, CABBAGE, MELON, CARROT, CASEIN, CASHEWNUT, CAULIFLOWER, CELERY     Note: Lab results reviewed.  PFSH  Drug: Ms. Kohn  reports no history of drug use. Alcohol:  reports no history of alcohol use. Tobacco:  reports that she quit smoking about 20 years ago. Her smoking use included cigarettes. She started smoking about 50 years ago. She has a 15 pack-year smoking history. She has never used smokeless tobacco. Medical:  has a past medical history of Anemia, Anginal pain (HCC), Aortic atherosclerosis  (HCC), Asthma, B12 deficiency, Carpal tunnel syndrome, CHF (congestive heart failure) (HCC), Chronic venous insufficiency, Complete tear of right rotator cuff, COPD (chronic obstructive pulmonary disease) (HCC), Coronary artery disease (01/07/2005), Diverticulosis, DOE (dyspnea on exertion), GERD (gastroesophageal reflux disease), History of bilateral cataract extraction (2014), Hyperlipidemia, Hypertension, Hypothyroidism, Long term current use of aspirin , Long term current use of immunosuppressive drug, Lower extremity edema, Lymphedema, NSTEMI (non-ST elevated myocardial infarction) (HCC) (01/06/2005), OSA on CPAP, Osteoarthritis, Peripheral vascular disease (HCC), Post-menopausal osteoporosis, Psoriatic arthritis (HCC), RA (rheumatoid arthritis) (HCC), Renal insufficiency, T2DM (type 2 diabetes mellitus) (HCC), and Thyroid  cancer (HCC). Family: family history includes Breast cancer in her cousin, maternal aunt, and paternal aunt; Diabetes in her mother and sister; Kidney failure in her sister.  Past Surgical History:  Procedure Laterality Date   ABDOMINAL HYSTERECTOMY  2003   BIOPSY THYROID   2017   x 2   BREAST BIOPSY Left 2000  benign   BREAST BIOPSY Right 2015   benign   CATARACT EXTRACTION, BILATERAL  2014   CORONARY/GRAFT ACUTE MI REVASCULARIZATION N/A 12/20/2022   Procedure: Coronary/Graft Acute MI Revascularization;  Surgeon: Mady Bruckner, MD;  Location: [facility];  Service: Cardiovascular;  Laterality: N/A;   left ankle surgery  2001   LEFT HEART CATH AND CORONARY ANGIOGRAPHY Left 01/10/2018   Procedure: LEFT HEART CATH AND CORONARY ANGIOGRAPHY;  Surgeon: Hester Wolm PARAS, MD;  Location: [facility];  Service: Cardiovascular;  Laterality: Left;   LEFT HEART CATH AND CORONARY ANGIOGRAPHY Left 01/07/2005   Procedure: LEFT HEART CATH AND CORONARY ANGIOGRAPHY; Location: [facility]; Surgeon: Wolm Hester, MD   LEFT HEART CATH AND CORONARY ANGIOGRAPHY Left 01/10/2008    Procedure: LEFT HEART CATH AND CORONARY ANGIOGRAPHY; Location: [facility]; Surgeon: Wolm Hester, MD / Margie Lovelace, MD   LEFT HEART CATH AND CORONARY ANGIOGRAPHY Left 01/04/2012   Procedure: LEFT HEART CATH AND CORONARY ANGIOGRAPHY; Location: [facility]; Surgeon: Wolm Hester, MD / Marsa Dooms, MD   LEFT HEART CATH AND CORONARY ANGIOGRAPHY N/A 12/20/2022   Procedure: LEFT HEART CATH AND CORONARY ANGIOGRAPHY;  Surgeon: Mady Bruckner, MD;  Location: [facility];  Service: Cardiovascular;  Laterality: N/A;   left shoulder surgery  1996   right elbow surgery  1999   THYROIDECTOMY, PARTIAL Left 09/05/2015   Procedure: LEFT HEMITHYROIDECTOMY   TOTAL KNEE ARTHROPLASTY Right 05/29/2018   Procedure: TOTAL KNEE ARTHROPLASTY-RIGHT;  Surgeon: Kathlynn Sharper, MD;  Location: [facility];  Service: Orthopedics;  Laterality: Right;   VEIN SURGERY Bilateral 2015   legs   Active Ambulatory Problems    Diagnosis Date Noted   Diabetes mellitus without complication (HCC) 06/16/2017   Hypertension 06/16/2017   Pain in limb 06/16/2017   Swelling of limb 06/16/2017   Chronic venous insufficiency 07/27/2017   Lymphedema 07/27/2017   Angina decubitus (HCC) 01/04/2018   S/P TKR (total knee replacement) using cement, right 05/29/2018   Angioedema 11/11/2022   Asthma, chronic 11/11/2022   Hypothyroidism 11/11/2022   GERD (gastroesophageal reflux disease) 11/11/2022   Rheumatoid arthritis (HCC) 11/11/2022   CAD (coronary artery disease) 11/11/2022   STEMI involving left anterior descending coronary artery (HCC) 12/20/2022   GERD without esophagitis 12/21/2022   Obesity, Class III, BMI 40-49.9 (morbid obesity) 12/21/2022   Hypokalemia 12/24/2022   Occipital neuralgia of left side 09/19/2023   Cervical facet joint syndrome 09/19/2023   Chronic pain syndrome 09/19/2023   Resolved Ambulatory Problems    Diagnosis Date Noted   No Resolved Ambulatory Problems   Past Medical History:  Diagnosis Date    Anemia    Anginal pain (HCC)    Aortic atherosclerosis (HCC)    Asthma    B12 deficiency    Carpal tunnel syndrome    CHF (congestive heart failure) (HCC)    Complete tear of right rotator cuff    COPD (chronic obstructive pulmonary disease) (HCC)    Coronary artery disease 01/07/2005   Diverticulosis    DOE (dyspnea on exertion)    History of bilateral cataract extraction 2014   Hyperlipidemia    Long term current use of aspirin     Long term current use of immunosuppressive drug    Lower extremity edema    NSTEMI (non-ST elevated myocardial infarction) (HCC) 01/06/2005   OSA on CPAP    Osteoarthritis    Peripheral vascular disease (HCC)    Post-menopausal osteoporosis    Psoriatic arthritis (HCC)    RA (rheumatoid  arthritis) (HCC)    Renal insufficiency    T2DM (type 2 diabetes mellitus) (HCC)    Thyroid  cancer (HCC)    Constitutional Exam  General appearance: Well nourished, well developed, and well hydrated. In no apparent acute distress Vitals:   09/19/23 0959  BP: (!) 154/79  Pulse: 79  Temp: 97.9 F (36.6 C)  SpO2: 99%  Weight: 220 lb (99.8 kg)  Height: 5' 4 (1.626 m)   BMI Assessment: Estimated body mass index is 37.76 kg/m as calculated from the following:   Height as of this encounter: 5' 4 (1.626 m).   Weight as of this encounter: 220 lb (99.8 kg).  BMI interpretation table: BMI level Category Range association with higher incidence of chronic pain  <18 kg/m2 Underweight   18.5-24.9 kg/m2 Ideal body weight   25-29.9 kg/m2 Overweight Increased incidence by 20%  30-34.9 kg/m2 Obese (Class I) Increased incidence by 68%  35-39.9 kg/m2 Severe obesity (Class II) Increased incidence by 136%  >40 kg/m2 Extreme obesity (Class III) Increased incidence by 254%   Patient's current BMI Ideal Body weight  Body mass index is 37.76 kg/m. Ideal body weight: 54.7 kg (120 lb 9.5 oz) Adjusted ideal body weight: 72.7 kg (160 lb 5.7 oz)   BMI Readings from Last 4  Encounters:  09/19/23 37.76 kg/m  08/10/23 38.28 kg/m  06/16/23 38.22 kg/m  05/09/23 38.22 kg/m   Wt Readings from Last 4 Encounters:  09/19/23 220 lb (99.8 kg)  08/10/23 223 lb (101.2 kg)  06/16/23 233 lb 4 oz (105.8 kg)  05/09/23 233 lb 3.2 oz (105.8 kg)    Psych/Mental status: Alert, oriented x 3 (person, place, & time)       Eyes: PERLA Respiratory: No evidence of acute respiratory distress Cervical Spine Area Exam  Skin & Axial Inspection: No masses, redness, edema, swelling, or associated skin lesions Alignment: Symmetrical Functional ROM: Pain restricted ROM      Stability: No instability detected Muscle Tone/Strength: Functionally intact. No obvious neuro-muscular anomalies detected. Sensory (Neurological): Neurogenic pain pattern Palpation: Complains of area being tender to palpation              Assessment  Primary Diagnosis & Pertinent Problem List: The primary encounter diagnosis was Occipital neuralgia of left side. Diagnoses of Cervical facet joint syndrome and Chronic pain syndrome were also pertinent to this visit.  Visit Diagnosis (New problems to examiner): 1. Occipital neuralgia of left side   2. Cervical facet joint syndrome   3. 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 The pain worsens with movement and is accompanied by headaches and earache-like pressure. Obtain cardiac clearance to stop Brilinta  for three days before performing a cervical facet block at C1-C2 with x-ray guidance. If clearance is not obtained, defer the procedure until at least October. Continue aspirin  therapy.  Occipital neuralgia   Occipital neuralgia, likely due to inflammation of nerves at C1-C2, causes headaches and occipital pain radiating over the scalp, primarily on the left side,  and is associated with cervical arthritis. Perform a left occipital nerve block without stopping Brilinta . Administer sedation with IV anti-anxiety medication if desired.        Procedure Orders         GREATER OCCIPITAL NERVE BLOCK  CERVICAL FACET (MEDIAL BRANCH NERVE BLOCK)        Provider-requested follow-up: Return in about 3 weeks (around 10/10/2023) for Left occipital nerve block + Left C1, C2, C3 Nerve, in clinic IV Versed  (dc Brillinta 3 days prior).  Future Appointments  Date Time Provider Department Center  10/16/2023  8:00 AM Marcelino Nurse, MD [facility] None   I discussed the assessment and treatment plan with the patient. The patient was provided an opportunity to ask questions and all were answered. The patient agreed with the plan and demonstrated an understanding of the instructions.  Patient advised to call back or seek an in-person evaluation if the symptoms or condition worsens.  Duration of encounter: .  Total time on encounter, as per AMA guidelines included both the face-to-face and non-face-to-face time personally spent by the physician and/or other qualified health care professional(s) on the day of the encounter (includes time in activities that require the physician or other qualified health care professional and does not include time in activities normally performed by clinical staff). Physician's time may include the following activities when performed: Preparing to see the patient (e.g., pre-charting review of records, searching for previously ordered imaging, lab work, and nerve conduction tests) Review of prior analgesic pharmacotherapies. Reviewing PMP Interpreting ordered tests (e.g., lab work, imaging, nerve conduction tests) Performing post-procedure evaluations, including interpretation of diagnostic procedures Obtaining and/or reviewing separately obtained history Performing a medically appropriate examination and/or evaluation Counseling  and educating the patient/family/caregiver Ordering medications, tests, or procedures Referring and communicating with other health care professionals (when not separately reported) Documenting clinical information in the electronic or other health record Independently interpreting results (not separately reported) and communicating results to the patient/ family/caregiver Care coordination (not separately reported)  Note by: Nurse Marcelino, MD (TTS and AI technology used. I apologize for any typographical errors that were not detected and corrected.) Date: 09/19/2023; Time: 4:20 PM

## 2023-09-20 DIAGNOSIS — G4733 Obstructive sleep apnea (adult) (pediatric): Secondary | ICD-10-CM | POA: Diagnosis not present

## 2023-09-20 DIAGNOSIS — J439 Emphysema, unspecified: Secondary | ICD-10-CM | POA: Diagnosis not present

## 2023-09-20 DIAGNOSIS — R0602 Shortness of breath: Secondary | ICD-10-CM | POA: Diagnosis not present

## 2023-09-20 DIAGNOSIS — J4540 Moderate persistent asthma, uncomplicated: Secondary | ICD-10-CM | POA: Diagnosis not present

## 2023-09-27 DIAGNOSIS — Z91018 Allergy to other foods: Secondary | ICD-10-CM | POA: Diagnosis not present

## 2023-09-27 DIAGNOSIS — T783XXD Angioneurotic edema, subsequent encounter: Secondary | ICD-10-CM | POA: Diagnosis not present

## 2023-10-16 ENCOUNTER — Ambulatory Visit
Attending: Student in an Organized Health Care Education/Training Program | Admitting: Student in an Organized Health Care Education/Training Program

## 2023-10-16 ENCOUNTER — Encounter: Payer: Self-pay | Admitting: Student in an Organized Health Care Education/Training Program

## 2023-10-16 VITALS — BP 152/91 | HR 66 | Temp 98.2°F | Resp 18 | Ht 64.0 in | Wt 223.0 lb

## 2023-10-16 DIAGNOSIS — M5481 Occipital neuralgia: Secondary | ICD-10-CM | POA: Diagnosis not present

## 2023-10-16 MED ORDER — LIDOCAINE HCL 2 % IJ SOLN
INTRAMUSCULAR | Status: AC
  Filled 2023-10-16: qty 20

## 2023-10-16 MED ORDER — ROPIVACAINE HCL 2 MG/ML IJ SOLN
4.0000 mL | Freq: Once | INTRAMUSCULAR | Status: AC
  Administered 2023-10-16: 20 mL via INTRA_ARTICULAR

## 2023-10-16 MED ORDER — DEXAMETHASONE SODIUM PHOSPHATE 10 MG/ML IJ SOLN
INTRAMUSCULAR | Status: AC
  Filled 2023-10-16: qty 1

## 2023-10-16 MED ORDER — LIDOCAINE HCL 2 % IJ SOLN
20.0000 mL | Freq: Once | INTRAMUSCULAR | Status: AC
  Administered 2023-10-16: 400 mg

## 2023-10-16 MED ORDER — DEXAMETHASONE SODIUM PHOSPHATE 10 MG/ML IJ SOLN
10.0000 mg | Freq: Once | INTRAMUSCULAR | Status: AC
  Administered 2023-10-16: 10 mg

## 2023-10-16 MED ORDER — ROPIVACAINE HCL 2 MG/ML IJ SOLN
INTRAMUSCULAR | Status: AC
  Filled 2023-10-16: qty 20

## 2023-10-16 NOTE — Patient Instructions (Signed)

## 2023-10-16 NOTE — Progress Notes (Signed)
 Safety precautions to be maintained throughout the outpatient stay will include: orient to surroundings, keep bed in low position, maintain call bell within reach at all times, provide assistance with transfer out of bed and ambulation.

## 2023-10-16 NOTE — Progress Notes (Signed)
 PROVIDER NOTE: Interpretation of information contained herein should be left to medically-trained personnel. Specific patient instructions are provided elsewhere under Patient Instructions section of medical record. This document was created in part using STT-dictation technology, any transcriptional errors that may result from this process are unintentional.  Patient: Jacqueline Rhodes Type: Established DOB: [birth year] MRN: [id number] PCP: Marikay Eva POUR, PA  Service: Procedure DOS: 10/16/2023 Setting: Ambulatory Location: Ambulatory outpatient facility Delivery: Face-to-face Provider: Wallie Sherry, MD Specialty: Interventional Pain Management Specialty designation: 09 Location: Outpatient facility Ref. Prov.: Marikay Eva POUR, PA       Interventional Therapy   Primary Reason for Visit: Interventional Pain Management Treatment. CC: Headache (Behind left ear)    Procedure:          Anesthesia, Analgesia, Anxiolysis:  Type: Diagnostic, Greater, Occipital Nerve Block  #1  Region: Posterolateral Cervical Level: Occipital Ridge   Laterality: Left  Anesthesia: Local (1-2% Lidocaine )  Anxiolysis: None  Sedation: None  Guidance: None           Position: Prone   1. Occipital neuralgia of left side    See assessment and plan from previous clinic note however plan was to perform left-sided cervical facet blocks as well however patient did not obtain clearance to stop her Brilinta .  We will proceed with left occipital nerve block and defer left cervical facet medial branch nerve blocks to a later time when she is able to stop her Brilinta  safely with clearance from cardiology.   NAS-11 Pain score:   Pre-procedure: 10-Worst pain ever/10   Post-procedure: 10-Worst pain ever/10     H&P (Pre-op Assessment):  Jacqueline Rhodes is a 68 y.o. (year old), female patient, seen today for interventional treatment. She  has a past surgical history that includes left shoulder surgery (1996);  right elbow surgery (1999); Abdominal hysterectomy (2003); left ankle surgery (2001); Cataract extraction, bilateral (2014); Vein Surgery (Bilateral, 2015); Biopsy thyroid  (2017); LEFT HEART CATH AND CORONARY ANGIOGRAPHY (Left, 01/10/2018); Total knee arthroplasty (Right, 05/29/2018); Breast biopsy (Left, 2000); Breast biopsy (Right, 2015); LEFT HEART CATH AND CORONARY ANGIOGRAPHY (Left, 01/07/2005); LEFT HEART CATH AND CORONARY ANGIOGRAPHY (Left, 01/10/2008); LEFT HEART CATH AND CORONARY ANGIOGRAPHY (Left, 01/04/2012); Thyroidectomy, partial (Left, 09/05/2015); Coronary/Graft Acute MI Revascularization (N/A, 12/20/2022); LEFT HEART CATH AND CORONARY ANGIOGRAPHY (N/A, 12/20/2022); and Colon surgery. Jacqueline Rhodes has a current medication list which includes the following prescription(s): albuterol , aspirin , calcium  fructoborate, cetirizine hcl, cholecalciferol, coq-10, epinephrine , ezetimibe , famotidine , fluticasone  furoate-vilanterol, folic acid , furosemide , isosorbide  mononitrate, levothyroxine , meclizine, methotrexate, metoprolol  succinate, nitroglycerin , pantoprazole , pravastatin , secukinumab , spironolactone , ticagrelor , UNABLE TO FIND, and prednisone . Her primarily concern today is the Headache (Behind left ear)  Initial Vital Signs:  Pulse/HCG Rate: 66ECG Heart Rate: 82 Temp: 98.2 F (36.8 C) Resp: 16 BP: (!) 139/108 SpO2: 100 %  BMI: Estimated body mass index is 38.28 kg/m as calculated from the following:   Height as of this encounter: 5' 4 (1.626 m).   Weight as of this encounter: 223 lb (101.2 kg).  Risk Assessment: Allergies: Reviewed. She is allergic to estrogens, losartan , codeine, flexeril [cyclobenzaprine], penicillins, terfenadine, and vicodin [hydrocodone-acetaminophen ].  Allergy Precautions: None required Coagulopathies: Reviewed. None identified.  Blood-thinner therapy: None at this time Active Infection(s): Reviewed. None identified. Ms. Patchen is afebrile  Site  Confirmation: Jacqueline Rhodes was asked to confirm the procedure and laterality before marking the site Procedure checklist: Completed Consent: Before the procedure and under the influence of no sedative(s), amnesic(s), or anxiolytics, the patient was informed of the treatment options,  risks and possible complications. To fulfill our ethical and legal obligations, as recommended by the American Medical Association's Code of Ethics, I have informed the patient of my clinical impression; the nature and purpose of the treatment or procedure; the risks, benefits, and possible complications of the intervention; the alternatives, including doing nothing; the risk(s) and benefit(s) of the alternative treatment(s) or procedure(s); and the risk(s) and benefit(s) of doing nothing. The patient was provided information about the general risks and possible complications associated with the procedure. These may include, but are not limited to: failure to achieve desired goals, infection, bleeding, organ or nerve damage, allergic reactions, paralysis, and death. In addition, the patient was informed of those risks and complications associated to the procedure, such as failure to decrease pain; infection; bleeding; organ or nerve damage with subsequent damage to sensory, motor, and/or autonomic systems, resulting in permanent pain, numbness, and/or weakness of one or several areas of the body; allergic reactions; (i.e.: anaphylactic reaction); and/or death. Furthermore, the patient was informed of those risks and complications associated with the medications. These include, but are not limited to: allergic reactions (i.e.: anaphylactic or anaphylactoid reaction(s)); adrenal axis suppression; blood sugar elevation that in diabetics may result in ketoacidosis or comma; water retention that in patients with history of congestive heart failure may result in shortness of breath, pulmonary edema, and decompensation with resultant heart  failure; weight gain; swelling or edema; medication-induced neural toxicity; particulate matter embolism and blood vessel occlusion with resultant organ, and/or nervous system infarction; and/or aseptic necrosis of one or more joints. Finally, the patient was informed that Medicine is not an exact science; therefore, there is also the possibility of unforeseen or unpredictable risks and/or possible complications that may result in a catastrophic outcome. The patient indicated having understood very clearly. We have given the patient no guarantees and we have made no promises. Enough time was given to the patient to ask questions, all of which were answered to the patient's satisfaction. Ms. Mcfadden has indicated that she wanted to continue with the procedure. Attestation: I, the ordering provider, attest that I have discussed with the patient the benefits, risks, side-effects, alternatives, likelihood of achieving goals, and potential problems during recovery for the procedure that I have provided informed consent. Date  Time: 10/16/2023  7:49 AM  Pre-Procedure Preparation:  Monitoring: As per clinic protocol. Respiration, ETCO2, SpO2, BP, heart rate and rhythm monitor placed and checked for adequate function Safety Precautions: Patient was assessed for positional comfort and pressure points before starting the procedure. Time-out: I initiated and conducted the Time-out before starting the procedure, as per protocol. The patient was asked to participate by confirming the accuracy of the Time Out information. Verification of the correct person, site, and procedure were performed and confirmed by me, the nursing staff, and the patient. Time-out conducted as per Joint Commission's Universal Protocol (UP.01.01.01). Time: 0833 Start Time: 0833 hrs.  Description of Procedure:          Target Area: Area medial to the occipital artery at the level of the superior nuchal ridge Approach: Posterior  approach Area Prepped: Entire Posterior Occipital Region ChloraPrep (2% chlorhexidine  gluconate and 70% isopropyl alcohol) Safety Precautions: Aspiration looking for blood return was conducted prior to all injections. At no point did we inject any substances, as a needle was being advanced. No attempts were made at seeking any paresthesias. Safe injection practices and needle disposal techniques used. Medications properly checked for expiration dates. SDV (single dose vial) medications used.  Description of the Procedure: Protocol guidelines were followed. The target area was identified and the area prepped in the usual manner. Skin & deeper tissues infiltrated with local anesthetic. Appropriate amount of time allowed to pass for local anesthetics to take effect. The procedure needles were then advanced to the target area. Proper needle placement secured. Negative aspiration confirmed. Solution injected in intermittent fashion, asking for systemic symptoms every 0.5cc of injectate. The needles were then removed and the area cleansed, making sure to leave some of the prepping solution back to take advantage of its long term bactericidal properties.  Vitals:   10/16/23 0802 10/16/23 0834 10/16/23 0837  BP: (!) 139/108 (!) 135/107 (!) 152/91  Pulse: 66    Resp: 16 19 18   Temp: 98.2 F (36.8 C)    SpO2: 100% 98% 98%  Weight: 223 lb (101.2 kg)    Height: 5' 4 (1.626 m)      Start Time: 0833 hrs. End Time: 0836 hrs. Materials:  5 cc solution made of 4 cc of 0.2% ropivacaine , 1 cc of Decadron  10 mg/cc.   Anti-infectives (From admission, onward)    None      Indication(s): None identified  Post-operative Assessment:  Post-procedure Vital Signs:  Pulse/HCG Rate: 6674 Temp: 98.2 F (36.8 C) Resp: 18 BP: (!) 152/91 SpO2: 98 %  EBL: None  Complications: No immediate post-treatment complications observed by team, or reported by patient.  Note: The patient tolerated the entire procedure  well. A repeat set of vitals were taken after the procedure and the patient was kept under observation following institutional policy, for this type of procedure. Post-procedural neurological assessment was performed, showing return to baseline, prior to discharge. The patient was provided with post-procedure discharge instructions, including a section on how to identify potential problems. Should any problems arise concerning this procedure, the patient was given instructions to immediately contact us , at any time, without hesitation. In any case, we plan to contact the patient by telephone for a follow-up status report regarding this interventional procedure.  Comments:  No additional relevant information.  Plan of Care (POC)  Orders:  No orders of the defined types were placed in this encounter.    Medications ordered for procedure: Meds ordered this encounter  Medications   lidocaine  (XYLOCAINE ) 2 % (with pres) injection 400 mg   ropivacaine  (PF) 2 mg/mL (0.2%) (NAROPIN ) injection 4 mL   dexamethasone  (DECADRON ) injection 10 mg   Medications administered: We administered lidocaine , ropivacaine  (PF) 2 mg/mL (0.2%), and dexamethasone .  See the medical record for exact dosing, route, and time of administration.   Follow-up plan:   Return in about 4 weeks (around 11/13/2023) for PPE, F2F.     Recent Visits Date Type Provider Dept  09/19/23 Office Visit Marcelino Nurse, MD [facility]  Showing recent visits within past 90 days and meeting all other requirements Today's Visits Date Type Provider Dept  10/16/23 Procedure visit Marcelino Nurse, MD [facility]  Showing today's visits and meeting all other requirements Future Appointments No visits were found meeting these conditions. Showing future appointments within next 90 days and meeting all other requirements   Disposition: Discharge home  Discharge (Date  Time): 10/16/2023; 0845 hrs.   Primary Care Physician:  Marikay Eva POUR, PA Location: [facility] Note by: Nurse Marcelino, MD (TTS technology used. I apologize for any typographical errors that were not detected and corrected.) Date: 10/16/2023; Time: 8:46 AM  Disclaimer:  Medicine is not an  exact science. The only guarantee in medicine is that nothing is guaranteed. It is important to note that the decision to proceed with this intervention was based on the information collected from the patient. The Data and conclusions were drawn from the patient's questionnaire, the interview, and the physical examination. Because the information was provided in large part by the patient, it cannot be guaranteed that it has not been purposely or unconsciously manipulated. Every effort has been made to obtain as much relevant data as possible for this evaluation. It is important to note that the conclusions that lead to this procedure are derived in large part from the available data. Always take into account that the treatment will also be dependent on availability of resources and existing treatment guidelines, considered by other Pain Management Practitioners as being common knowledge and practice, at the time of the intervention. For Medico-Legal purposes, it is also important to point out that variation in procedural techniques and pharmacological choices are the acceptable norm. The indications, contraindications, technique, and results of the above procedure should only be interpreted and judged by a Board-Certified Interventional Pain Specialist with extensive familiarity and expertise in the same exact procedure and technique.

## 2023-10-17 ENCOUNTER — Telehealth: Payer: Self-pay | Admitting: *Deleted

## 2023-10-17 NOTE — Telephone Encounter (Signed)
Post procedure call;  voicemail left to call if there are questions or concerns.

## 2023-10-25 DIAGNOSIS — E782 Mixed hyperlipidemia: Secondary | ICD-10-CM | POA: Diagnosis not present

## 2023-10-25 DIAGNOSIS — E119 Type 2 diabetes mellitus without complications: Secondary | ICD-10-CM | POA: Diagnosis not present

## 2023-10-25 DIAGNOSIS — E890 Postprocedural hypothyroidism: Secondary | ICD-10-CM | POA: Diagnosis not present

## 2023-10-25 DIAGNOSIS — I10 Essential (primary) hypertension: Secondary | ICD-10-CM | POA: Diagnosis not present

## 2023-11-01 ENCOUNTER — Other Ambulatory Visit: Payer: Self-pay | Admitting: Physician Assistant

## 2023-11-01 DIAGNOSIS — E782 Mixed hyperlipidemia: Secondary | ICD-10-CM | POA: Diagnosis not present

## 2023-11-01 DIAGNOSIS — Z0000 Encounter for general adult medical examination without abnormal findings: Secondary | ICD-10-CM | POA: Diagnosis not present

## 2023-11-01 DIAGNOSIS — N1831 Chronic kidney disease, stage 3a: Secondary | ICD-10-CM | POA: Diagnosis not present

## 2023-11-01 DIAGNOSIS — L4050 Arthropathic psoriasis, unspecified: Secondary | ICD-10-CM | POA: Diagnosis not present

## 2023-11-01 DIAGNOSIS — Z1231 Encounter for screening mammogram for malignant neoplasm of breast: Secondary | ICD-10-CM

## 2023-11-01 DIAGNOSIS — T783XXS Angioneurotic edema, sequela: Secondary | ICD-10-CM | POA: Diagnosis not present

## 2023-11-01 DIAGNOSIS — M0579 Rheumatoid arthritis with rheumatoid factor of multiple sites without organ or systems involvement: Secondary | ICD-10-CM | POA: Diagnosis not present

## 2023-11-01 DIAGNOSIS — G4733 Obstructive sleep apnea (adult) (pediatric): Secondary | ICD-10-CM | POA: Diagnosis not present

## 2023-11-01 DIAGNOSIS — E039 Hypothyroidism, unspecified: Secondary | ICD-10-CM | POA: Diagnosis not present

## 2023-11-01 DIAGNOSIS — E119 Type 2 diabetes mellitus without complications: Secondary | ICD-10-CM | POA: Diagnosis not present

## 2023-11-01 DIAGNOSIS — I10 Essential (primary) hypertension: Secondary | ICD-10-CM | POA: Diagnosis not present

## 2023-11-01 DIAGNOSIS — Z1331 Encounter for screening for depression: Secondary | ICD-10-CM | POA: Diagnosis not present

## 2023-11-09 ENCOUNTER — Ambulatory Visit
Attending: Student in an Organized Health Care Education/Training Program | Admitting: Student in an Organized Health Care Education/Training Program

## 2023-11-09 ENCOUNTER — Encounter: Payer: Self-pay | Admitting: Student in an Organized Health Care Education/Training Program

## 2023-11-09 VITALS — BP 154/93 | HR 68 | Temp 97.5°F | Resp 18 | Wt 224.0 lb

## 2023-11-09 DIAGNOSIS — G894 Chronic pain syndrome: Secondary | ICD-10-CM | POA: Diagnosis not present

## 2023-11-09 DIAGNOSIS — M47812 Spondylosis without myelopathy or radiculopathy, cervical region: Secondary | ICD-10-CM | POA: Insufficient documentation

## 2023-11-09 DIAGNOSIS — M5481 Occipital neuralgia: Secondary | ICD-10-CM | POA: Diagnosis not present

## 2023-11-09 MED ORDER — HYDROCODONE-ACETAMINOPHEN 5-325 MG PO TABS: 1.0000 | ORAL_TABLET | Freq: Two times a day (BID) | ORAL | 0 refills | Status: AC | PRN

## 2023-11-09 NOTE — Progress Notes (Signed)
 PROVIDER NOTE: Interpretation of information contained herein should be left to medically-trained personnel. Specific patient instructions are provided elsewhere under Patient Instructions section of medical record. This document was created in part using AI and STT-dictation technology, any transcriptional errors that may result from this process are unintentional.  Patient: Jacqueline Rhodes  Service: E/M   PCP: Marikay Eva POUR, PA  DOB: 12-23-1955  DOS: 11/09/2023  Provider: Wallie Sherry, MD  MRN: [id number]  Delivery: Face-to-face  Specialty: Interventional Pain Management  Type: Established Patient  Setting: Ambulatory outpatient facility  Specialty designation: 09  Referring Prov.: Marikay Eva POUR, PA  Location: Outpatient office facility       History of present illness (HPI) Jacqueline Rhodes, a 68 y.o. year old female, is here today because of her Cervical facet joint syndrome [M47.812]. Jacqueline Rhodes's primary complain today is Neck Pain (left)  Pertinent problems: Jacqueline Rhodes does not have any pertinent problems on file.  Pain Assessment: Severity of Chronic pain is reported as a 10-Worst pain ever/10. Location: Neck Left/numbness in left side of face. Onset: More than a month ago. Quality: Other (Comment) (excruciating). Timing: Constant. Modifying factor(s): nothing. Vitals:  weight is 224 lb (101.6 kg). Her temporal temperature is 97.5 F (36.4 C) (abnormal). Her blood pressure is 154/93 (abnormal) and her pulse is 68. Her respiration is 18 and oxygen saturation is 97%.  BMI: Estimated body mass index is 38.45 kg/m as calculated from the following:   Height as of 10/16/23: 5' 4 (1.626 m).   Weight as of this encounter: 224 lb (101.6 kg).  Last encounter: 09/19/2023. Last procedure: 10/16/2023.  Reason for encounter: post-procedure evaluation and assessment.   Post-Procedure Evaluation    Procedure:          Anesthesia, Analgesia, Anxiolysis:  Type: Diagnostic,  Greater, Occipital Nerve Block  #1  Region: Posterolateral Cervical Level: Occipital Ridge   Laterality: Left  Anesthesia: Local (1-2% Lidocaine )  Anxiolysis: None  Sedation: None  Guidance: None           Position: Prone   1. Occipital neuralgia of left side    See assessment and plan from previous clinic note however plan was to perform left-sided cervical facet blocks as well however patient did not obtain clearance to stop her Brilinta .  We will proceed with left occipital nerve block and defer left cervical facet medial branch nerve blocks to a later time when she is able to stop her Brilinta  safely with clearance from cardiology.   NAS-11 Pain score:   Pre-procedure: 10-Worst pain ever/10   Post-procedure: 10-Worst pain ever/10     Effectiveness:  Initial hour after procedure: 100 %  Subsequent 4-6 hours post-procedure: 100 %  Analgesia past initial 6 hours: 50 %  Ongoing improvement:  Analgesic:  50% Function: Jacqueline Rhodes reports improvement in function ROM: Ms. Quist reports improvement in ROM   ROS  Constitutional: Denies any fever or chills Gastrointestinal: No reported hemesis, hematochezia, vomiting, or acute GI distress Musculoskeletal: Left cervicalgia Neurological: No reported episodes of acute onset apraxia, aphasia, dysarthria, agnosia, amnesia, paralysis, loss of coordination, or loss of consciousness  Medication Review  Calcium  Fructoborate, Cetirizine HCl, Cholecalciferol, CoQ-10, EPINEPHrine , HYDROcodone -acetaminophen , Secukinumab , UNABLE TO FIND, albuterol , aspirin , ezetimibe , famotidine , fluticasone  furoate-vilanterol, folic acid , furosemide , isosorbide  mononitrate, levothyroxine , meclizine, methotrexate, metoprolol  succinate, nitroGLYCERIN , pantoprazole , pravastatin , spironolactone , and ticagrelor   History Review  Allergy: Jacqueline Rhodes is allergic to estrogens, losartan , codeine, flexeril [cyclobenzaprine], penicillins, terfenadine, and vicodin  [hydrocodone -acetaminophen ]. Drug: Jacqueline Rhodes  reports no history of drug use. Alcohol:  reports no history of alcohol use. Tobacco:  reports that she quit smoking about 20 years ago. Her smoking use included cigarettes. She started smoking about 50 years ago. She has a 15 pack-year smoking history. She has never used smokeless tobacco. Social: Jacqueline Rhodes  reports that she quit smoking about 20 years ago. Her smoking use included cigarettes. She started smoking about 50 years ago. She has a 15 pack-year smoking history. She has never used smokeless tobacco. She reports that she does not drink alcohol and does not use drugs. Medical:  has a past medical history of Anemia, Anginal pain (HCC), Aortic atherosclerosis (HCC), Asthma, B12 deficiency, Carpal tunnel syndrome, CHF (congestive heart failure) (HCC), Chronic venous insufficiency, Complete tear of right rotator cuff, COPD (chronic obstructive pulmonary disease) (HCC), Coronary artery disease (01/07/2005), Diverticulosis, DOE (dyspnea on exertion), GERD (gastroesophageal reflux disease), History of bilateral cataract extraction (2014), Hyperlipidemia, Hypertension, Hypothyroidism, Long term current use of aspirin , Long term current use of immunosuppressive drug, Lower extremity edema, Lymphedema, NSTEMI (non-ST elevated myocardial infarction) (HCC) (01/06/2005), OSA on CPAP, Osteoarthritis, Peripheral vascular disease (HCC), Post-menopausal osteoporosis, Psoriatic arthritis (HCC), RA (rheumatoid arthritis) (HCC), Renal insufficiency, T2DM (type 2 diabetes mellitus) (HCC), and Thyroid  cancer (HCC). Surgical: Jacqueline Rhodes  has a past surgical history that includes left shoulder surgery (1996); right elbow surgery (1999); Abdominal hysterectomy (2003); left ankle surgery (2001); Cataract extraction, bilateral (2014); Vein Surgery (Bilateral, 2015); Biopsy thyroid  (2017); LEFT HEART CATH AND CORONARY ANGIOGRAPHY (Left, 01/10/2018); Total knee arthroplasty  (Right, 05/29/2018); Breast biopsy (Left, 2000); Breast biopsy (Right, 2015); LEFT HEART CATH AND CORONARY ANGIOGRAPHY (Left, 01/07/2005); LEFT HEART CATH AND CORONARY ANGIOGRAPHY (Left, 01/10/2008); LEFT HEART CATH AND CORONARY ANGIOGRAPHY (Left, 01/04/2012); Thyroidectomy, partial (Left, 09/05/2015); Coronary/Graft Acute MI Revascularization (N/A, 12/20/2022); LEFT HEART CATH AND CORONARY ANGIOGRAPHY (N/A, 12/20/2022); and Colon surgery. Family: family history includes Breast cancer in her cousin, maternal aunt, and paternal aunt; Diabetes in her mother and sister; Kidney failure in her sister.  Laboratory Chemistry Profile   Renal Lab Results  Component Value Date   BUN 20 06/16/2023   CREATININE 1.04 (H) 06/16/2023   BCR 20 11/29/2016   GFRAA >60 05/30/2018   GFRNONAA 58 (L) 06/16/2023    Hepatic Lab Results  Component Value Date   AST 16 06/16/2023   ALT 15 06/16/2023   ALBUMIN 3.6 06/16/2023   ALKPHOS 66 06/16/2023    Electrolytes Lab Results  Component Value Date   NA 138 06/16/2023   K 3.5 06/16/2023   CL 100 06/16/2023   CALCIUM  9.3 06/16/2023    Bone No results found for: VD25OH, VD125OH2TOT, CI6874NY7, CI7874NY7, 25OHVITD1, 25OHVITD2, 25OHVITD3, TESTOFREE, TESTOSTERONE  Inflammation (CRP: Acute Phase) (ESR: Chronic Phase) Lab Results  Component Value Date   ESRSEDRATE 17 05/16/2018         Note: Above Lab results reviewed.  Recent Imaging Review  MR CERVICAL SPINE W WO CONTRAST CLINICAL DATA:  Left-sided neck pain beginning in July 2024 with subsequent worsening. History of thyroid  cancer.  EXAM: MRI CERVICAL SPINE WITHOUT AND WITH CONTRAST  TECHNIQUE: Multiplanar and multiecho pulse sequences of the cervical spine, to include the craniocervical junction and cervicothoracic junction, were obtained without and with intravenous contrast.  CONTRAST:  10 cc Vueway   COMPARISON:  03/19/2009 MRI.  04/19/2023 CT.  FINDINGS: Alignment:  Minimal scoliotic curvature.  No listhesis.  Vertebrae: No fracture or focal bone lesion. No edematous endplate marrow changes or edematous facet arthritis. Edematous  change at the left-sided articulation of C1 and C2 which would likely be painful.  Cord: No cord compression or focal cord lesion.  Posterior Fossa, vertebral arteries, paraspinal tissues: Negative other than aberrant origin of the right subclavian artery as the last vessel from the arch.  Disc levels:  The foramen magnum is widely patent. There is ordinary mild osteoarthritis of the C1-2 articulation but no encroachment upon the neural structures. Osteoarthritis of the C1-C2 articulation in the left side shows edema and enhancement which would likely relate to left-sided craniocervical pain syndromes.  C2-3: Minimal disc bulge.  No stenosis.  C3-4: Normal interspace.  C4-5: Endplate osteophytes and mild bulging of the disc. Mild facet osteoarthritis. No compressive canal or foraminal narrowing.  C5-6: Small endplate osteophytes. Facet degeneration on the right without edema. No canal stenosis. Mild right foraminal narrowing.  C6-7: Normal interspace.  C7-T1: Minimal disc bulge.  No stenosis.  IMPRESSION: 1. Osteoarthritis of the C1-C2 articulation on the left with edema and enhancement. This would likely be painful. 2. Mild degenerative changes at C4-5 and C5-6. Mild right foraminal narrowing at C5-6. 3. No evidence of metastatic disease.  Electronically Signed   By: Oneil Officer M.D.   On: 08/02/2023 13:16 Note: Reviewed        Physical Exam  Vitals: BP (!) 154/93 (Cuff Size: Normal)   Pulse 68   Temp (!) 97.5 F (36.4 C) (Temporal)   Resp 18   Wt 224 lb (101.6 kg)   SpO2 97%   BMI 38.45 kg/m  BMI: Estimated body mass index is 38.45 kg/m as calculated from the following:   Height as of 10/16/23: 5' 4 (1.626 m).   Weight as of this encounter: 224 lb (101.6 kg). Ideal: Ideal body weight: 54.7  kg (120 lb 9.5 oz) Adjusted ideal body weight: 73.5 kg (161 lb 15.3 oz) General appearance: Well nourished, well developed, and well hydrated. In no apparent acute distress Mental status: Alert, oriented x 3 (person, place, & time)       Respiratory: No evidence of acute respiratory distress Eyes: PERLA  Left deep cervical pain and occipital pain Assessment   Diagnosis  1. Cervical facet joint syndrome   2. Occipital neuralgia of left side   3. Chronic pain syndrome      Updated Problems: No problems updated.  Plan of Care  Assessment and Plan    Cervical radiculopathy and cervical spine osteoarthritis with left neck and shoulder pain   Chronic cervical radiculopathy and cervical spine osteoarthritis have acutely worsened, causing severe pain radiating from the left neck to the back of the shoulder. Pain is unresponsive to ibuprofen, acetaminophen , ice, or heat. A previous left occipital nerve block injection provided partial relief but did not address the current pain location. MRI confirms cervical spine arthritis. Deeper cervical injections including cervical facet medial branch nerve blocks are not possible due to ongoing Brilinta  therapy, which continues until October. Pain management is challenging due to medication intolerance and financial constraints. Plan to administer a repeat injection targeting a slightly lower area by way of a trigger point injection a repeat left occipital nerve block and add volume.  Patient can continue Brilinta  for this.  Prescribe hydrocodone , 1-2 tablets daily as needed for severe pain. Schedule a follow-up for the injection on September 8th at 3 PM, advising arrival 15 minutes early.   PMP checked and reviewed        Ms. Chantee E Lehrke has a current medication list which  includes the following long-term medication(s): cetirizine hcl, ezetimibe , famotidine , furosemide , isosorbide  mononitrate, metoprolol  succinate, nitroglycerin , pravastatin , and  spironolactone .  Pharmacotherapy (Medications Ordered): Meds ordered this encounter  Medications   HYDROcodone -acetaminophen  (NORCO/VICODIN) 5-325 MG tablet    Sig: Take 1-2 tablets by mouth every 12 (twelve) hours as needed for severe pain (pain score 7-10). Must last 30 days    Dispense:  60 tablet    Refill:  0    Chronic Pain: STOP Act (Not applicable) Fill 1 day early if closed on refill date. Avoid benzodiazepines within 8 hours of opioids   Orders:  Orders Placed This Encounter  Procedures   GREATER OCCIPITAL NERVE BLOCK    Scheduling Instructions:     Procedure: Left GONB    Where will this procedure be performed?:   [facility]   TRIGGER POINT INJECTION    Standing Status:   Future    Expected Date:   11/23/2023    Expiration Date:   02/09/2024    Where will this procedure be performed?:   [facility]     Left occipital nerve block 11/09/2023   Return in about 11 days (around 11/20/2023) for Left GONB and left TPI (in clinic room).    Recent Visits Date Type Provider Dept  10/16/23 Procedure visit Marcelino Nurse, MD [facility]  09/19/23 Office Visit Marcelino Nurse, MD [facility]  Showing recent visits within past 90 days and meeting all other requirements Today's Visits Date Type Provider Dept  11/09/23 Office Visit Marcelino Nurse, MD [facility]  Showing today's visits and meeting all other requirements Future Appointments Date Type Provider Dept  11/20/23 Appointment Marcelino Nurse, MD [facility]  11/23/23 Appointment Marcelino Nurse, MD [facility]  Showing future appointments within next 90 days and meeting all other requirements  I discussed the assessment and treatment plan with the patient. The patient was provided an opportunity to ask questions and all were answered. The patient agreed with the plan and demonstrated an understanding of the instructions.  Patient advised to call back or seek an  in-person evaluation if the symptoms or condition worsens.  Duration of encounter: .  Total time on encounter, as per AMA guidelines included both the face-to-face and non-face-to-face time personally spent by the physician and/or other qualified health care professional(s) on the day of the encounter (includes time in activities that require the physician or other qualified health care professional and does not include time in activities normally performed by clinical staff). Physician's time may include the following activities when performed: Preparing to see the patient (e.g., pre-charting review of records, searching for previously ordered imaging, lab work, and nerve conduction tests) Review of prior analgesic pharmacotherapies. Reviewing PMP Interpreting ordered tests (e.g., lab work, imaging, nerve conduction tests) Performing post-procedure evaluations, including interpretation of diagnostic procedures Obtaining and/or reviewing separately obtained history Performing a medically appropriate examination and/or evaluation Counseling and educating the patient/family/caregiver Ordering medications, tests, or procedures Referring and communicating with other health care professionals (when not separately reported) Documenting clinical information in the electronic or other health record Independently interpreting results (not separately reported) and communicating results to the patient/ family/caregiver Care coordination (not separately reported)  Note by: Nurse Marcelino, MD (TTS and AI technology used. I apologize for any typographical errors that were not detected and corrected.) Date: 11/09/2023; Time: 11:54 AM

## 2023-11-09 NOTE — Patient Instructions (Signed)

## 2023-11-20 ENCOUNTER — Encounter: Payer: Self-pay | Admitting: Student in an Organized Health Care Education/Training Program

## 2023-11-20 ENCOUNTER — Ambulatory Visit
Attending: Student in an Organized Health Care Education/Training Program | Admitting: Student in an Organized Health Care Education/Training Program

## 2023-11-20 VITALS — BP 118/64 | HR 62 | Temp 98.0°F | Resp 16 | Ht 64.0 in | Wt 225.0 lb

## 2023-11-20 DIAGNOSIS — M7918 Myalgia, other site: Secondary | ICD-10-CM | POA: Insufficient documentation

## 2023-11-20 DIAGNOSIS — M47812 Spondylosis without myelopathy or radiculopathy, cervical region: Secondary | ICD-10-CM | POA: Diagnosis not present

## 2023-11-20 DIAGNOSIS — M5481 Occipital neuralgia: Secondary | ICD-10-CM | POA: Diagnosis not present

## 2023-11-20 DIAGNOSIS — G894 Chronic pain syndrome: Secondary | ICD-10-CM | POA: Insufficient documentation

## 2023-11-20 MED ORDER — TRAMADOL HCL 50 MG PO TABS: 50.0000 mg | ORAL_TABLET | Freq: Two times a day (BID) | ORAL | 1 refills | Status: DC | PRN

## 2023-11-20 MED ORDER — ROPIVACAINE HCL 2 MG/ML IJ SOLN
INTRAMUSCULAR | Status: AC
  Filled 2023-11-20: qty 20

## 2023-11-20 MED ORDER — TRAMADOL HCL 50 MG PO TABS: 50.0000 mg | ORAL_TABLET | Freq: Two times a day (BID) | ORAL | 1 refills | Status: AC | PRN

## 2023-11-20 MED ORDER — DEXAMETHASONE SODIUM PHOSPHATE 10 MG/ML IJ SOLN
INTRAMUSCULAR | Status: AC
  Filled 2023-11-20: qty 2

## 2023-11-20 MED ORDER — ROPIVACAINE HCL 2 MG/ML IJ SOLN
9.0000 mL | Freq: Once | INTRAMUSCULAR | Status: AC
  Administered 2023-11-20: 9 mL via PERINEURAL

## 2023-11-20 MED ORDER — DEXAMETHASONE SODIUM PHOSPHATE 10 MG/ML IJ SOLN
10.0000 mg | Freq: Once | INTRAMUSCULAR | Status: AC
  Administered 2023-11-20: 10 mg

## 2023-11-20 NOTE — Patient Instructions (Signed)

## 2023-11-20 NOTE — Addendum Note (Signed)
 Addended by: MARCELINO NURSE on: 11/20/2023 03:53 PM   Modules accepted: Orders

## 2023-11-20 NOTE — Progress Notes (Signed)
 PROVIDER NOTE: Interpretation of information contained herein should be left to medically-trained personnel. Specific patient instructions are provided elsewhere under Patient Instructions section of medical record. This document was created in part using STT-dictation technology, any transcriptional errors that may result from this process are unintentional.  Patient: Jacqueline Rhodes Type: Established DOB: Oct 30, 1955 MRN: [id number] PCP: Marikay Eva POUR, PA  Service: Procedure DOS: 11/20/2023 Setting: Ambulatory Location: Ambulatory outpatient facility Delivery: Face-to-face Provider: Wallie Sherry, MD Specialty: Interventional Pain Management Specialty designation: 09 Location: Outpatient facility Ref. Prov.: Marikay Eva POUR, PA       Interventional Therapy   Primary Reason for Visit: Interventional Pain Management Treatment. CC: Neck Pain (Left side)    Procedure:          Anesthesia, Analgesia, Anxiolysis:  Type: Diagnostic, Greater, Occipital Nerve Block          and left trapezius TPI Region: Posterolateral Cervical Level: Occipital Ridge   Laterality: Left  Anesthesia: Local (1-2% Lidocaine )  Anxiolysis: None  Sedation: None  Guidance: None           Position: Prone   1. Cervical facet joint syndrome   2. Occipital neuralgia of left side   3. Chronic pain syndrome    NAS-11 Pain score:   Pre-procedure: 7 /10   Post-procedure: 0-No pain (no pain when discharged)/10     H&P (Pre-op Assessment):  Jacqueline Rhodes is a 68 y.o. (year old), female patient, seen today for interventional treatment. She  has a past surgical history that includes left shoulder surgery (1996); right elbow surgery (1999); Abdominal hysterectomy (2003); left ankle surgery (2001); Cataract extraction, bilateral (2014); Vein Surgery (Bilateral, 2015); Biopsy thyroid  (2017); LEFT HEART CATH AND CORONARY ANGIOGRAPHY (Left, 01/10/2018); Total knee arthroplasty (Right, 05/29/2018); Breast biopsy  (Left, 2000); Breast biopsy (Right, 2015); LEFT HEART CATH AND CORONARY ANGIOGRAPHY (Left, 01/07/2005); LEFT HEART CATH AND CORONARY ANGIOGRAPHY (Left, 01/10/2008); LEFT HEART CATH AND CORONARY ANGIOGRAPHY (Left, 01/04/2012); Thyroidectomy, partial (Left, 09/05/2015); Coronary/Graft Acute MI Revascularization (N/A, 12/20/2022); LEFT HEART CATH AND CORONARY ANGIOGRAPHY (N/A, 12/20/2022); and Colon surgery. Jacqueline Rhodes has a current medication list which includes the following prescription(s): albuterol , aspirin , calcium  fructoborate, cetirizine hcl, cholecalciferol, coq-10, epinephrine , ezetimibe , famotidine , fluticasone  furoate-vilanterol, folic acid , furosemide , isosorbide  mononitrate, levothyroxine , meclizine, methotrexate, metoprolol  succinate, nitroglycerin , pantoprazole , pravastatin , secukinumab , spironolactone , ticagrelor , tramadol , UNABLE TO FIND, and hydrocodone -acetaminophen . Her primarily concern today is the Neck Pain (Left side)  Initial Vital Signs:  Pulse/HCG Rate: 62ECG Heart Rate: 64 Temp: 98 F (36.7 C) Resp: 16 BP: 112/62 SpO2: 100 %  BMI: Estimated body mass index is 38.62 kg/m as calculated from the following:   Height as of this encounter: 5' 4 (1.626 m).   Weight as of this encounter: 225 lb (102.1 kg).  Risk Assessment: Allergies: Reviewed. She is allergic to estrogens, losartan , codeine, flexeril [cyclobenzaprine], penicillins, terfenadine, and vicodin [hydrocodone -acetaminophen ].  Allergy Precautions: None required Coagulopathies: Reviewed. None identified.  Blood-thinner therapy: None at this time Active Infection(s): Reviewed. None identified. Jacqueline Rhodes is afebrile  Site Confirmation: Jacqueline Rhodes was asked to confirm the procedure and laterality before marking the site Procedure checklist: Completed Consent: Before the procedure and under the influence of no sedative(s), amnesic(s), or anxiolytics, the patient was informed of the treatment options, risks and  possible complications. To fulfill our ethical and legal obligations, as recommended by the American Medical Association's Code of Ethics, I have informed the patient of my clinical impression; the nature and purpose of the treatment or procedure; the risks,  benefits, and possible complications of the intervention; the alternatives, including doing nothing; the risk(s) and benefit(s) of the alternative treatment(s) or procedure(s); and the risk(s) and benefit(s) of doing nothing. The patient was provided information about the general risks and possible complications associated with the procedure. These may include, but are not limited to: failure to achieve desired goals, infection, bleeding, organ or nerve damage, allergic reactions, paralysis, and death. In addition, the patient was informed of those risks and complications associated to the procedure, such as failure to decrease pain; infection; bleeding; organ or nerve damage with subsequent damage to sensory, motor, and/or autonomic systems, resulting in permanent pain, numbness, and/or weakness of one or several areas of the body; allergic reactions; (i.e.: anaphylactic reaction); and/or death. Furthermore, the patient was informed of those risks and complications associated with the medications. These include, but are not limited to: allergic reactions (i.e.: anaphylactic or anaphylactoid reaction(s)); adrenal axis suppression; blood sugar elevation that in diabetics may result in ketoacidosis or comma; water retention that in patients with history of congestive heart failure may result in shortness of breath, pulmonary edema, and decompensation with resultant heart failure; weight gain; swelling or edema; medication-induced neural toxicity; particulate matter embolism and blood vessel occlusion with resultant organ, and/or nervous system infarction; and/or aseptic necrosis of one or more joints. Finally, the patient was informed that Medicine is not an  exact science; therefore, there is also the possibility of unforeseen or unpredictable risks and/or possible complications that may result in a catastrophic outcome. The patient indicated having understood very clearly. We have given the patient no guarantees and we have made no promises. Enough time was given to the patient to ask questions, all of which were answered to the patient's satisfaction. Ms. Rasool has indicated that she wanted to continue with the procedure. Attestation: I, the ordering provider, attest that I have discussed with the patient the benefits, risks, side-effects, alternatives, likelihood of achieving goals, and potential problems during recovery for the procedure that I have provided informed consent. Date  Time: 11/20/2023  2:37 PM  Pre-Procedure Preparation:  Monitoring: As per clinic protocol. Respiration, ETCO2, SpO2, BP, heart rate and rhythm monitor placed and checked for adequate function Safety Precautions: Patient was assessed for positional comfort and pressure points before starting the procedure. Time-out: I initiated and conducted the Time-out before starting the procedure, as per protocol. The patient was asked to participate by confirming the accuracy of the Time Out information. Verification of the correct person, site, and procedure were performed and confirmed by me, the nursing staff, and the patient. Time-out conducted as per Joint Commission's Universal Protocol (UP.01.01.01). Time: 1458 Start Time: 1458 hrs.  Description of Procedure:          Target Area: Area medial to the occipital artery at the level of the superior nuchal ridge Approach: Posterior approach Area Prepped: Entire Posterior Occipital Region ChloraPrep (2% chlorhexidine  gluconate and 70% isopropyl alcohol) Safety Precautions: Aspiration looking for blood return was conducted prior to all injections. At no point did we inject any substances, as a needle was being advanced. No  attempts were made at seeking any paresthesias. Safe injection practices and needle disposal techniques used. Medications properly checked for expiration dates. SDV (single dose vial) medications used. Description of the Procedure: Protocol guidelines were followed. The target area was identified and the area prepped in the usual manner. Skin & deeper tissues infiltrated with local anesthetic. Appropriate amount of time allowed to pass for local anesthetics to take  effect. The procedure needles were then advanced to the target area. Proper needle placement secured. Negative aspiration confirmed. Solution injected in intermittent fashion, asking for systemic symptoms every 0.5cc of injectate. The needles were then removed and the area cleansed, making sure to leave some of the prepping solution back to take advantage of its long term bactericidal properties.  Vitals:   11/20/23 1440 11/20/23 1502  BP: 112/62 118/64  Pulse: 62   Resp:  16  Temp: 98 F (36.7 C)   SpO2: 100% 97%  Weight: 225 lb (102.1 kg)   Height: 5' 4 (1.626 m)     Start Time: 1458 hrs. End Time: 1502 hrs. Materials:  Needle(s) Type: Regular needle Gauge: 22G Length: 1.5-in Medication(s): Please see orders for medications and dosing details.  7 cc solution made of 6 cc of 0.2% ropivacaine , 1 cc of Decadron  10 mg/cc. 5 cc injected for the left greater occipital nerve, 2 cc injected for the left trapezius trigger point and needling performed   Post-operative Assessment:  Post-procedure Vital Signs:  Pulse/HCG Rate: 6264 Temp: 98 F (36.7 C) Resp: 16 BP: 118/64 SpO2: 97 %  EBL: None  Complications: No immediate post-treatment complications observed by team, or reported by patient.  Note: The patient tolerated the entire procedure well. A repeat set of vitals were taken after the procedure and the patient was kept under observation following institutional policy, for this type of procedure. Post-procedural  neurological assessment was performed, showing return to baseline, prior to discharge. The patient was provided with post-procedure discharge instructions, including a section on how to identify potential problems. Should any problems arise concerning this procedure, the patient was given instructions to immediately contact us , at any time, without hesitation. In any case, we plan to contact the patient by telephone for a follow-up status report regarding this interventional procedure.  Comments:  No additional relevant information.  Plan of Care (POC)  PMP checked and reviewed.  At patient's last visit, she was prescribed hydrocodone  and developed an adverse reaction to it.  She states that she broke out in hives and developed itching.  She states that she has not done this in the past with oxycodone  or tramadol .  Small prescription for tramadol  as below.  Medications ordered for procedure: Meds ordered this encounter  Medications   ropivacaine  (PF) 2 mg/mL (0.2%) (NAROPIN ) injection 9 mL   dexamethasone  (DECADRON ) injection 10 mg   traMADol  (ULTRAM ) 50 MG tablet    Sig: Take 1 tablet (50 mg total) by mouth every 12 (twelve) hours as needed for severe pain (pain score 7-10).    Dispense:  60 tablet    Refill:  1    Fill one day early if pharmacy is closed on scheduled refill date. Do not fill until:  To last until:   Medications administered: We administered ropivacaine  (PF) 2 mg/mL (0.2%) and dexamethasone .  See the medical record for exact dosing, route, and time of administration.    Left occipital nerve block 11/09/2023, 11/20/2023     Follow-up plan:   No follow-ups on file.     Recent Visits Date Type Provider Dept  11/09/23 Office Visit Marcelino Nurse, MD [facility]  10/16/23 Procedure visit Marcelino Nurse, MD [facility]  09/19/23 Office Visit Marcelino Nurse, MD [facility]  Showing recent visits within past 90 days and meeting all other  requirements Today's Visits Date Type Provider Dept  11/20/23 Procedure visit Marcelino Nurse, MD [facility]  Showing  today's visits and meeting all other requirements Future Appointments Date Type Provider Dept  12/21/23 Appointment Marcelino Nurse, MD [facility]  Showing future appointments within next 90 days and meeting all other requirements   Disposition: Discharge home  Discharge (Date  Time): 11/20/2023; 1504 hrs.   Primary Care Physician: Marikay Eva POUR, PA Location: [facility] Note by: Nurse Marcelino, MD (TTS technology used. I apologize for any typographical errors that were not detected and corrected.) Date: 11/20/2023; Time: 3:21 PM  Disclaimer:  Medicine is not an Visual merchandiser. The only guarantee in medicine is that nothing is guaranteed. It is important to note that the decision to proceed with this intervention was based on the information collected from the patient. The Data and conclusions were drawn from the patient's questionnaire, the interview, and the physical examination. Because the information was provided in large part by the patient, it cannot be guaranteed that it has not been purposely or unconsciously manipulated. Every effort has been made to obtain as much relevant data as possible for this evaluation. It is important to note that the conclusions that lead to this procedure are derived in large part from the available data. Always take into account that the treatment will also be dependent on availability of resources and existing treatment guidelines, considered by other Pain Management Practitioners as being common knowledge and practice, at the time of the intervention. For Medico-Legal purposes, it is also important to point out that variation in procedural techniques and pharmacological choices are the acceptable norm. The indications, contraindications, technique, and results of the above procedure should  only be interpreted and judged by a Board-Certified Interventional Pain Specialist with extensive familiarity and expertise in the same exact procedure and technique.

## 2023-11-20 NOTE — Progress Notes (Signed)
 Safety precautions to be maintained throughout the outpatient stay will include: orient to surroundings, keep bed in low position, maintain call bell within reach at all times, provide assistance with transfer out of bed and ambulation.

## 2023-11-21 ENCOUNTER — Telehealth: Payer: Self-pay

## 2023-11-21 NOTE — Telephone Encounter (Signed)
 Post procedure follow up.  Patient states she is doing well.   ?

## 2023-11-23 ENCOUNTER — Ambulatory Visit: Admitting: Student in an Organized Health Care Education/Training Program

## 2023-11-24 ENCOUNTER — Ambulatory Visit
Admission: RE | Admit: 2023-11-24 | Discharge: 2023-11-24 | Disposition: A | Discharge status: Home / Self Care | Source: Ambulatory Visit | Attending: Physician Assistant | Admitting: Physician Assistant

## 2023-11-24 DIAGNOSIS — Z1231 Encounter for screening mammogram for malignant neoplasm of breast: Secondary | ICD-10-CM | POA: Insufficient documentation

## 2023-12-20 DIAGNOSIS — Z59868 Other specified financial insecurity: Secondary | ICD-10-CM | POA: Diagnosis not present

## 2023-12-20 DIAGNOSIS — I10 Essential (primary) hypertension: Secondary | ICD-10-CM | POA: Diagnosis not present

## 2023-12-20 DIAGNOSIS — Z5971 Insufficient health insurance coverage: Secondary | ICD-10-CM | POA: Diagnosis not present

## 2023-12-20 DIAGNOSIS — E876 Hypokalemia: Secondary | ICD-10-CM | POA: Diagnosis not present

## 2023-12-20 DIAGNOSIS — L4050 Arthropathic psoriasis, unspecified: Secondary | ICD-10-CM | POA: Diagnosis not present

## 2023-12-20 DIAGNOSIS — I890 Lymphedema, not elsewhere classified: Secondary | ICD-10-CM | POA: Diagnosis not present

## 2023-12-20 DIAGNOSIS — E66812 Obesity, class 2: Secondary | ICD-10-CM | POA: Diagnosis not present

## 2023-12-20 DIAGNOSIS — J439 Emphysema, unspecified: Secondary | ICD-10-CM | POA: Diagnosis not present

## 2023-12-20 DIAGNOSIS — Z87898 Personal history of other specified conditions: Secondary | ICD-10-CM | POA: Diagnosis not present

## 2023-12-20 DIAGNOSIS — E538 Deficiency of other specified B group vitamins: Secondary | ICD-10-CM | POA: Diagnosis not present

## 2023-12-20 DIAGNOSIS — G4733 Obstructive sleep apnea (adult) (pediatric): Secondary | ICD-10-CM | POA: Diagnosis not present

## 2023-12-20 DIAGNOSIS — E782 Mixed hyperlipidemia: Secondary | ICD-10-CM | POA: Diagnosis not present

## 2023-12-20 DIAGNOSIS — Z79899 Other long term (current) drug therapy: Secondary | ICD-10-CM | POA: Diagnosis not present

## 2023-12-20 DIAGNOSIS — I2510 Atherosclerotic heart disease of native coronary artery without angina pectoris: Secondary | ICD-10-CM | POA: Diagnosis not present

## 2023-12-20 DIAGNOSIS — E119 Type 2 diabetes mellitus without complications: Secondary | ICD-10-CM | POA: Diagnosis not present

## 2023-12-20 DIAGNOSIS — I2102 ST elevation (STEMI) myocardial infarction involving left anterior descending coronary artery: Secondary | ICD-10-CM | POA: Diagnosis not present

## 2023-12-20 DIAGNOSIS — I5020 Unspecified systolic (congestive) heart failure: Secondary | ICD-10-CM | POA: Diagnosis not present

## 2023-12-21 ENCOUNTER — Ambulatory Visit
Attending: Student in an Organized Health Care Education/Training Program | Admitting: Student in an Organized Health Care Education/Training Program

## 2023-12-21 ENCOUNTER — Encounter: Payer: Self-pay | Admitting: Student in an Organized Health Care Education/Training Program

## 2023-12-21 VITALS — BP 122/72 | HR 72 | Temp 97.5°F | Resp 16 | Ht 64.0 in | Wt 226.0 lb

## 2023-12-21 DIAGNOSIS — M5481 Occipital neuralgia: Secondary | ICD-10-CM | POA: Diagnosis not present

## 2023-12-21 DIAGNOSIS — G894 Chronic pain syndrome: Secondary | ICD-10-CM | POA: Insufficient documentation

## 2023-12-21 DIAGNOSIS — M47812 Spondylosis without myelopathy or radiculopathy, cervical region: Secondary | ICD-10-CM | POA: Diagnosis not present

## 2023-12-21 NOTE — Progress Notes (Signed)
 Safety precautions to be maintained throughout the outpatient stay will include: orient to surroundings, keep bed in low position, maintain call bell within reach at all times, provide assistance with transfer out of bed and ambulation.

## 2023-12-21 NOTE — Progress Notes (Signed)
 PROVIDER NOTE: Interpretation of information contained herein should be left to medically-trained personnel. Specific patient instructions are provided elsewhere under Patient Instructions section of medical record. This document was created in part using AI and STT-dictation technology, any transcriptional errors that may result from this process are unintentional.  Patient: Jacqueline Rhodes  Service: E/M   PCP: Marikay Eva POUR, PA  DOB: 1956-01-16  DOS: 12/21/2023  Provider: Wallie Sherry, MD  MRN: [id number]  Delivery: Face-to-face  Specialty: Interventional Pain Management  Type: Established Patient  Setting: Ambulatory outpatient facility  Specialty designation: 09  Referring Prov.: Marikay Eva POUR, PA  Location: Outpatient office facility       History of present illness (HPI) [name], a 68 y.o. year old female, is here today because of her Cervical facet joint syndrome [M47.812]. [name] primary complain today is Neck Pain (Left )  Pain Assessment: Severity of Chronic pain is reported as a 7 /10. Location: Neck Left/denies. Onset: More than a month ago. Quality: Discomfort, Tightness. Timing: Intermittent. Modifying factor(s): injection helped for a short time, ROM has improved but pain is still there.. Vitals:  height is 5' 4 (1.626 m) and weight is 226 lb (102.5 kg). Her temporal temperature is 97.5 F (36.4 C) (abnormal). Her blood pressure is 122/72 and her pulse is 72. Her respiration is 16 and oxygen saturation is 99%.  BMI: Estimated body mass index is 38.79 kg/m as calculated from the following:   Height as of this encounter: 5' 4 (1.626 m).   Weight as of this encounter: 226 lb (102.5 kg).  Last encounter: 11/09/2023. Last procedure: 11/20/2023.  Reason for encounter:   Post-Procedure Evaluation   Procedure:          Anesthesia, Analgesia, Anxiolysis:  Type: Diagnostic, Greater, Occipital Nerve Block          and left trapezius TPI Region:  Posterolateral Cervical Level: Occipital Ridge   Laterality: Left  Anesthesia: Local (1-2% Lidocaine )  Anxiolysis: None  Sedation: None  Guidance: None           Position: Prone   1. Cervical facet joint syndrome   2. Occipital neuralgia of left side   3. Chronic pain syndrome    NAS-11 Pain score:   Pre-procedure: 7 /10   Post-procedure: 0-No pain (no pain when discharged)/10     Effectiveness:  Initial hour after procedure: 60 %  Subsequent 4-6 hours post-procedure: 60 %  Analgesia past initial 6 hours: 60 % (helped some for about 6 hours, continues to have decreased ROM on the left)  Ongoing improvement:  Analgesic:  60% Function: Somewhat improved ROM: Somewhat improved   History of Present Illness   EDDITH Rhodes is a 68 year old female with occipital neuralgia and left cervical facet arthropathy.  She follows up for postprocedure evaluation after her occipital nerve block which was helpful as noted above.  She will also be discontinuing her Brilinta  this Friday so can consider cervical facet medial branch nerve blocks.  She has been experiencing cervical pain, particularly in the neck area where the head turns. The pain is less severe following trigger point injections, but certain movements still exacerbate it.  She has psoriatic arthritis and recently started a new medication regimen. She received four samples of a new medication and took the first dose last night. This medication feels different from her previous treatment with Cosentyx , as it initially made her feel like she was getting sick.  She recently  saw a cardiologist who advised her to stop taking Brilinta , a blood thinner, as of tomorrow. She has been on blood thinners since October 2020 and will continue taking a baby aspirin  but will not be on any other blood thinners like Plavix.  She has put off shoulder surgery since her heart attack and is scheduled to see an orthopedic doctor on the 27th of this  month.         ROS  Constitutional: Denies any fever or chills Gastrointestinal: No reported hemesis, hematochezia, vomiting, or acute GI distress Musculoskeletal: Left cervicalgia Neurological: No reported episodes of acute onset apraxia, aphasia, dysarthria, agnosia, amnesia, paralysis, loss of coordination, or loss of consciousness  Medication Review  Calcium  Fructoborate, Cetirizine HCl, Cholecalciferol, CoQ-10, EPINEPHrine , Secukinumab , UNABLE TO FIND, albuterol , aspirin , ezetimibe , famotidine , fluticasone  furoate-vilanterol, folic acid , furosemide , isosorbide  mononitrate, levothyroxine , meclizine, methotrexate, metoprolol  succinate, nitroGLYCERIN , pantoprazole , pravastatin , spironolactone , ticagrelor , and traMADol   History Review  Allergy: Jacqueline Rhodes is allergic to estrogens, losartan , codeine, flexeril [cyclobenzaprine], penicillins, terfenadine, and vicodin [hydrocodone -acetaminophen ]. Drug: Jacqueline Rhodes  reports no history of drug use. Alcohol:  reports no history of alcohol use. Tobacco:  reports that she quit smoking about 20 years ago. Her smoking use included cigarettes. She started smoking about 50 years ago. She has a 15 pack-year smoking history. She has never used smokeless tobacco. Social: Jacqueline Rhodes  reports that she quit smoking about 20 years ago. Her smoking use included cigarettes. She started smoking about 50 years ago. She has a 15 pack-year smoking history. She has never used smokeless tobacco. She reports that she does not drink alcohol and does not use drugs. Medical:  has a past medical history of Anemia, Anginal pain, Aortic atherosclerosis, Asthma, B12 deficiency, Carpal tunnel syndrome, CHF (congestive heart failure) (HCC), Chronic venous insufficiency, Complete tear of right rotator cuff, COPD (chronic obstructive pulmonary disease) (HCC), Coronary artery disease (01/07/2005), Diverticulosis, DOE (dyspnea on exertion), GERD (gastroesophageal reflux disease),  History of bilateral cataract extraction (2014), Hyperlipidemia, Hypertension, Hypothyroidism, Long term current use of aspirin , Long term current use of immunosuppressive drug, Lower extremity edema, Lymphedema, NSTEMI (non-ST elevated myocardial infarction) (HCC) (01/06/2005), OSA on CPAP, Osteoarthritis, Peripheral vascular disease, Post-menopausal osteoporosis, Psoriatic arthritis (HCC), RA (rheumatoid arthritis) (HCC), Renal insufficiency, T2DM (type 2 diabetes mellitus) (HCC), and Thyroid  cancer (HCC). Surgical: Ms. Flight  has a past surgical history that includes left shoulder surgery (1996); right elbow surgery (1999); Abdominal hysterectomy (2003); left ankle surgery (2001); Cataract extraction, bilateral (2014); Vein Surgery (Bilateral, 2015); Biopsy thyroid  (2017); LEFT HEART CATH AND CORONARY ANGIOGRAPHY (Left, 01/10/2018); Total knee arthroplasty (Right, 05/29/2018); Breast biopsy (Left, 2000); Breast biopsy (Right, 2015); LEFT HEART CATH AND CORONARY ANGIOGRAPHY (Left, 01/07/2005); LEFT HEART CATH AND CORONARY ANGIOGRAPHY (Left, 01/10/2008); LEFT HEART CATH AND CORONARY ANGIOGRAPHY (Left, 01/04/2012); Thyroidectomy, partial (Left, 09/05/2015); Coronary/Graft Acute MI Revascularization (N/A, 12/20/2022); LEFT HEART CATH AND CORONARY ANGIOGRAPHY (N/A, 12/20/2022); and Colon surgery. Family: family history includes Breast cancer in her cousin, maternal aunt, and paternal aunt; Diabetes in her mother and sister; Kidney failure in her sister.  Laboratory Chemistry Profile   Renal Lab Results  Component Value Date   BUN 20 06/16/2023   CREATININE 1.04 (H) 06/16/2023   BCR 20 11/29/2016   GFRAA >60 05/30/2018   GFRNONAA 58 (L) 06/16/2023    Hepatic Lab Results  Component Value Date   AST 16 06/16/2023   ALT 15 06/16/2023   ALBUMIN 3.6 06/16/2023   ALKPHOS 66 06/16/2023    Electrolytes Lab Results  Component Value Date   NA 138 06/16/2023   K 3.5 06/16/2023   CL 100 06/16/2023    CALCIUM  9.3 06/16/2023    Bone No results found for: VD25OH, [id number], CI6874NY7, CI7874NY7, 25OHVITD1, 25OHVITD2, 25OHVITD3, TESTOFREE, TESTOSTERONE  Inflammation (CRP: Acute Phase) (ESR: Chronic Phase) Lab Results  Component Value Date   ESRSEDRATE 17 05/16/2018         Note: Above Lab results reviewed.  Recent Imaging Review  MM 3D SCREENING MAMMOGRAM BILATERAL BREAST CLINICAL DATA:  Screening.  EXAM: DIGITAL SCREENING BILATERAL MAMMOGRAM WITH TOMOSYNTHESIS AND CAD  TECHNIQUE: Bilateral screening digital craniocaudal and mediolateral oblique mammograms were obtained. Bilateral screening digital breast tomosynthesis was performed. The images were evaluated with computer-aided detection.  COMPARISON:  Previous exam(s).  ACR Breast Density Category c: The breasts are heterogeneously dense, which may obscure small masses.  FINDINGS: There are no findings suspicious for malignancy.  IMPRESSION: No mammographic evidence of malignancy. A result letter of this screening mammogram will be mailed directly to the patient.  RECOMMENDATION: Screening mammogram in one year. (Code:[id number]-B-01Y)  BI-RADS CATEGORY  1: Negative.  Electronically Signed   By: Reyes Phi M.D.   On: 11/28/2023 10:04  CLINICAL DATA:  Left-sided neck pain beginning in July 2024 with subsequent worsening. History of thyroid  cancer.   EXAM: MRI CERVICAL SPINE WITHOUT AND WITH CONTRAST   TECHNIQUE: Multiplanar and multiecho pulse sequences of the cervical spine, to include the craniocervical junction and cervicothoracic junction, were obtained without and with intravenous contrast.   CONTRAST:  10 cc Vueway    COMPARISON:  03/19/2009 MRI.  04/19/2023 CT.   FINDINGS: Alignment: Minimal scoliotic curvature.  No listhesis.   Vertebrae: No fracture or focal bone lesion. No edematous endplate marrow changes or edematous facet arthritis. Edematous change at the left-sided  articulation of C1 and C2 which would likely be painful.   Cord: No cord compression or focal cord lesion.   Posterior Fossa, vertebral arteries, paraspinal tissues: Negative other than aberrant origin of the right subclavian artery as the last vessel from the arch.   Disc levels:   The foramen magnum is widely patent. There is ordinary mild osteoarthritis of the C1-2 articulation but no encroachment upon the neural structures. Osteoarthritis of the C1-C2 articulation in the left side shows edema and enhancement which would likely relate to left-sided craniocervical pain syndromes.   C2-3: Minimal disc bulge.  No stenosis.   C3-4: Normal interspace.   C4-5: Endplate osteophytes and mild bulging of the disc. Mild facet osteoarthritis. No compressive canal or foraminal narrowing.   C5-6: Small endplate osteophytes. Facet degeneration on the right without edema. No canal stenosis. Mild right foraminal narrowing.   C6-7: Normal interspace.   C7-T1: Minimal disc bulge.  No stenosis.   IMPRESSION: 1. Osteoarthritis of the C1-C2 articulation on the left with edema and enhancement. This would likely be painful. 2. Mild degenerative changes at C4-5 and C5-6. Mild right foraminal narrowing at C5-6. 3. No evidence of metastatic disease.     Electronically Signed   By: Oneil Officer M.D.   On: 08/02/2023 13:16  Note: Reviewed        Physical Exam  Vitals: BP 122/72 (BP Location: Right Arm, Patient Position: Sitting, Cuff Size: Normal)   Pulse 72   Temp (!) 97.5 F (36.4 C) (Temporal)   Resp 16   Ht 5' 4 (1.626 m)   Wt 226 lb (102.5 kg)   SpO2 99%   BMI 38.79 kg/m  BMI: Estimated  body mass index is 38.79 kg/m as calculated from the following:   Height as of this encounter: 5' 4 (1.626 m).   Weight as of this encounter: 226 lb (102.5 kg). Ideal: Ideal body weight: 54.7 kg (120 lb 9.5 oz) Adjusted ideal body weight: 73.8 kg (162 lb 12.1 oz) General appearance: Well  nourished, well developed, and well hydrated. In no apparent acute distress Mental status: Alert, oriented x 3 (person, place, & time)       Respiratory: No evidence of acute respiratory distress Eyes: PERLA Left cervical spine pain, left occipital pain  Assessment   Diagnosis  1. Cervical facet joint syndrome   2. Occipital neuralgia of left side   3. Chronic pain syndrome      Updated Problems: No problems updated.  Plan of Care   ALISSANDRA GEOFFROY has a history of greater than 3 months of moderate to severe pain which is resulted in functional impairment.  The patient has tried various conservative therapeutic options such as NSAIDs, Tylenol , muscle relaxants, physical therapy which was inadequately effective.  Patient's pain is predominantly axial with physical exam findings suggestive of  cervical facet arthropathy.  Cervical facet medial branch nerve blocks were discussed with the patient.  Risks and benefits were reviewed.  Patient would like to proceed with left third occipital nerve, C3, C4 medial branch nerve block.  This will be done with moderate sedation   Ms. TORRA PALA has a current medication list which includes the following long-term medication(s): cetirizine hcl, ezetimibe , famotidine , furosemide , isosorbide  mononitrate, metoprolol  succinate, nitroglycerin , pravastatin , and spironolactone .  Pharmacotherapy (Medications Ordered): No orders of the defined types were placed in this encounter.  Orders:  Orders Placed This Encounter  Procedures   CERVICAL FACET (MEDIAL BRANCH NERVE BLOCK)     Standing Status:   Future    Expected Date:   12/27/2023    Expiration Date:   12/20/2024    Scheduling Instructions:     Procedure: Cervical facet Block     Type: Medial Branch Block     Side: LEFT     Purpose: Diagnostic Radiologic Mapping     Level(s): C2-3, C3-4, by Fluoroscopic Pain Mapping Facet joints (TON, C3, C4) Medial Branch Nerves)     Sedation: moderate  sedation     Timeframe: As soon as schedule allows.    Where will this procedure be performed?:   [facility]     Left occipital nerve block 11/09/2023, 11/20/2023    Return in about 6 days (around 12/27/2023) for Left TON, C3, C4 MBNB, ECT.    Recent Visits Date Type Provider Dept  11/20/23 Procedure visit Marcelino Nurse, MD [facility]  11/09/23 Office Visit Marcelino Nurse, MD [facility]  10/16/23 Procedure visit Marcelino Nurse, MD [facility]  Showing recent visits within past 90 days and meeting all other requirements Today's Visits Date Type Provider Dept  12/21/23 Office Visit Marcelino Nurse, MD [facility]  Showing today's visits and meeting all other requirements Future Appointments No visits were found meeting these conditions. Showing future appointments within next 90 days and meeting all other requirements  I discussed the assessment and treatment plan with the patient. The patient was provided an opportunity to ask questions and all were answered. The patient agreed with the plan and demonstrated an understanding of the instructions.  Patient advised to call back or seek an in-person evaluation if the symptoms or condition worsens.  I personally spent a  total of 30 minutes in the care of the patient today including preparing to see the patient, getting/reviewing separately obtained history, performing a medically appropriate exam/evaluation, counseling and educating, placing orders, and documenting clinical information in the EHR.   Note by: Wallie Sherry, MD (TTS and AI technology used. I apologize for any typographical errors that were not detected and corrected.) Date: 12/21/2023; Time: 9:50 AM

## 2023-12-21 NOTE — Patient Instructions (Signed)

## 2023-12-27 ENCOUNTER — Ambulatory Visit
Admission: RE | Admit: 2023-12-27 | Discharge: 2023-12-27 | Disposition: A | Discharge status: Home / Self Care | Source: Ambulatory Visit | Attending: Student in an Organized Health Care Education/Training Program | Admitting: Student in an Organized Health Care Education/Training Program

## 2023-12-27 ENCOUNTER — Encounter: Payer: Self-pay | Admitting: Student in an Organized Health Care Education/Training Program

## 2023-12-27 ENCOUNTER — Ambulatory Visit (HOSPITAL_BASED_OUTPATIENT_CLINIC_OR_DEPARTMENT_OTHER): Admitting: Student in an Organized Health Care Education/Training Program

## 2023-12-27 VITALS — BP 101/49 | HR 65 | Temp 97.2°F | Resp 14 | Ht 64.0 in | Wt 227.0 lb

## 2023-12-27 DIAGNOSIS — M47812 Spondylosis without myelopathy or radiculopathy, cervical region: Secondary | ICD-10-CM | POA: Diagnosis not present

## 2023-12-27 DIAGNOSIS — M5481 Occipital neuralgia: Secondary | ICD-10-CM

## 2023-12-27 DIAGNOSIS — G894 Chronic pain syndrome: Secondary | ICD-10-CM | POA: Diagnosis not present

## 2023-12-27 MED ORDER — LIDOCAINE HCL 2 % IJ SOLN
INTRAMUSCULAR | Status: AC
  Filled 2023-12-27: qty 20

## 2023-12-27 MED ORDER — ROPIVACAINE HCL 2 MG/ML IJ SOLN
INTRAMUSCULAR | Status: AC
  Filled 2023-12-27: qty 20

## 2023-12-27 MED ORDER — LIDOCAINE HCL 2 % IJ SOLN
20.0000 mL | Freq: Once | INTRAMUSCULAR | Status: AC
  Administered 2023-12-27: 400 mg

## 2023-12-27 MED ORDER — FENTANYL CITRATE (PF) 100 MCG/2ML IJ SOLN
25.0000 ug | INTRAMUSCULAR | Status: DC | PRN
  Administered 2023-12-27: 50 ug via INTRAVENOUS

## 2023-12-27 MED ORDER — LACTATED RINGERS IV SOLN: Freq: Once | INTRAVENOUS | Status: AC

## 2023-12-27 MED ORDER — MIDAZOLAM HCL 5 MG/5ML IJ SOLN
0.5000 mg | Freq: Once | INTRAMUSCULAR | Status: AC
  Administered 2023-12-27: 2 mg via INTRAVENOUS

## 2023-12-27 MED ORDER — FENTANYL CITRATE (PF) 100 MCG/2ML IJ SOLN
INTRAMUSCULAR | Status: AC
  Filled 2023-12-27: qty 2

## 2023-12-27 MED ORDER — DEXAMETHASONE SOD PHOSPHATE PF 10 MG/ML IJ SOLN
20.0000 mg | Freq: Once | INTRAMUSCULAR | Status: AC
  Administered 2023-12-27: 20 mg

## 2023-12-27 MED ORDER — MIDAZOLAM HCL 5 MG/5ML IJ SOLN
INTRAMUSCULAR | Status: AC
  Filled 2023-12-27: qty 5

## 2023-12-27 MED ORDER — ROPIVACAINE HCL 2 MG/ML IJ SOLN
9.0000 mL | Freq: Once | INTRAMUSCULAR | Status: AC
  Administered 2023-12-27: 9 mL via PERINEURAL

## 2023-12-27 NOTE — Progress Notes (Signed)
 Safety precautions to be maintained throughout the outpatient stay will include: orient to surroundings, keep bed in low position, maintain call bell within reach at all times, provide assistance with transfer out of bed and ambulation.

## 2023-12-27 NOTE — Patient Instructions (Signed)

## 2023-12-27 NOTE — Progress Notes (Signed)
 PROVIDER NOTE: Interpretation of information contained herein should be left to medically-trained personnel. Specific patient instructions are provided elsewhere under Patient Instructions section of medical record. This document was created in part using STT-dictation technology, any transcriptional errors that may result from this process are unintentional.  Patient: Jacqueline Rhodes Type: Established DOB: 1955/10/19 MRN: [id number] PCP: Marikay Eva POUR, PA  Service: Procedure DOS: 12/27/2023 Setting: Ambulatory Location: Ambulatory outpatient facility Delivery: Face-to-face Provider: Wallie Sherry, MD Specialty: Interventional Pain Management Specialty designation: 09 Location: Outpatient facility Ref. Prov.: Marikay Eva POUR, PA       Interventional Therapy   Procedure: Cervical Facet Medial Branch Block(s) #1  Laterality: Left  Level: TON, C3, and C4 Medial Branch Level(s). Injecting these levels blocks the C2-3 and C3-4 cervical facet joints.  Imaging: Fluoroscopic guidance Anesthesia: Local anesthesia (1-2% Lidocaine ) Sedation: Moderate Sedation                       DOS: 12/27/2023  Performed by: Wallie Sherry, MD  Purpose: Diagnostic/Therapeutic Indications: Cervicalgia (cervical spine axial pain) severe enough to impact quality of life or function. 1. Cervical facet joint syndrome   2. Occipital neuralgia of left side   3. Chronic pain syndrome    NAS-11 Pain score:   Pre-procedure: 7 /10   Post-procedure: 0-No pain/10     Position / Prep / Materials:  Position: Prone. Head in cradle. C-spine slightly flexed. Prep solution: ChloraPrep (2% chlorhexidine  gluconate and 70% isopropyl alcohol) Prep Area: Posterior Cervico-thoracic Region. From occipital ridge to tip of scapula, and from shoulder to shoulder. Entire posterior and lateral neck surface. Materials:  Tray: Block Needle(s):  Type: Spinal  Gauge (G): 22  Length: 3.5-in  Qty:  2     H&P (Pre-op  Assessment):  Jacqueline Rhodes is a 68 y.o. (year old), female patient, seen today for interventional treatment. She  has a past surgical history that includes left shoulder surgery (1996); right elbow surgery (1999); Abdominal hysterectomy (2003); left ankle surgery (2001); Cataract extraction, bilateral (2014); Vein Surgery (Bilateral, 2015); Biopsy thyroid  (2017); LEFT HEART CATH AND CORONARY ANGIOGRAPHY (Left, 01/10/2018); Total knee arthroplasty (Right, 05/29/2018); Breast biopsy (Left, 2000); Breast biopsy (Right, 2015); LEFT HEART CATH AND CORONARY ANGIOGRAPHY (Left, 01/07/2005); LEFT HEART CATH AND CORONARY ANGIOGRAPHY (Left, 01/10/2008); LEFT HEART CATH AND CORONARY ANGIOGRAPHY (Left, 01/04/2012); Thyroidectomy, partial (Left, 09/05/2015); Coronary/Graft Acute MI Revascularization (N/A, 12/20/2022); LEFT HEART CATH AND CORONARY ANGIOGRAPHY (N/A, 12/20/2022); and Colon surgery. Jacqueline Rhodes has a current medication list which includes the following prescription(s): albuterol , aspirin , calcium  fructoborate, cetirizine hcl, cholecalciferol, coq-10, epinephrine , ezetimibe , famotidine , fluticasone  furoate-vilanterol, folic acid , furosemide , isosorbide  mononitrate, levothyroxine , meclizine, methotrexate, metoprolol  succinate, nitroglycerin , pantoprazole , pravastatin , secukinumab , spironolactone , tramadol , UNABLE TO FIND, and ticagrelor , and the following Facility-Administered Medications: fentanyl . Her primarily concern today is the Neck Pain (Bilateral neck base of skull )  Initial Vital Signs:  Pulse/HCG Rate: 65ECG Heart Rate: 63 (nsr) Temp: 97.6 F (36.4 C) Resp: 16 BP: (!) 149/76 SpO2: 100 %  BMI: Estimated body mass index is 38.96 kg/m as calculated from the following:   Height as of this encounter: 5' 4 (1.626 m).   Weight as of this encounter: 227 lb (103 kg).  Risk Assessment: Allergies: Reviewed. She is allergic to estrogens, losartan , codeine, flexeril [cyclobenzaprine], penicillins,  terfenadine, and vicodin [hydrocodone -acetaminophen ].  Allergy Precautions: None required Coagulopathies: Reviewed. None identified.  Blood-thinner therapy: None at this time Active Infection(s): Reviewed. None identified. Jacqueline Rhodes is afebrile  Site Confirmation:  Jacqueline Rhodes was asked to confirm the procedure and laterality before marking the site Procedure checklist: Completed Consent: Before the procedure and under the influence of no sedative(s), amnesic(s), or anxiolytics, the patient was informed of the treatment options, risks and possible complications. To fulfill our ethical and legal obligations, as recommended by the American Medical Association's Code of Ethics, I have informed the patient of my clinical impression; the nature and purpose of the treatment or procedure; the risks, benefits, and possible complications of the intervention; the alternatives, including doing nothing; the risk(s) and benefit(s) of the alternative treatment(s) or procedure(s); and the risk(s) and benefit(s) of doing nothing. The patient was provided information about the general risks and possible complications associated with the procedure. These may include, but are not limited to: failure to achieve desired goals, infection, bleeding, organ or nerve damage, allergic reactions, paralysis, and death. In addition, the patient was informed of those risks and complications associated to Spine-related procedures, such as failure to decrease pain; infection (i.e.: Meningitis, epidural or intraspinal abscess); bleeding (i.e.: epidural hematoma, subarachnoid hemorrhage, or any other type of intraspinal or peri-dural bleeding); organ or nerve damage (i.e.: Any type of peripheral nerve, nerve root, or spinal cord injury) with subsequent damage to sensory, motor, and/or autonomic systems, resulting in permanent pain, numbness, and/or weakness of one or several areas of the body; allergic reactions; (i.e.: anaphylactic  reaction); and/or death. Furthermore, the patient was informed of those risks and complications associated with the medications. These include, but are not limited to: allergic reactions (i.e.: anaphylactic or anaphylactoid reaction(s)); adrenal axis suppression; blood sugar elevation that in diabetics may result in ketoacidosis or comma; water retention that in patients with history of congestive heart failure may result in shortness of breath, pulmonary edema, and decompensation with resultant heart failure; weight gain; swelling or edema; medication-induced neural toxicity; particulate matter embolism and blood vessel occlusion with resultant organ, and/or nervous system infarction; and/or aseptic necrosis of one or more joints. Finally, the patient was informed that Medicine is not an exact science; therefore, there is also the possibility of unforeseen or unpredictable risks and/or possible complications that may result in a catastrophic outcome. The patient indicated having understood very clearly. We have given the patient no guarantees and we have made no promises. Enough time was given to the patient to ask questions, all of which were answered to the patient's satisfaction. Ms. Danielski has indicated that she wanted to continue with the procedure. Attestation: I, the ordering provider, attest that I have discussed with the patient the benefits, risks, side-effects, alternatives, likelihood of achieving goals, and potential problems during recovery for the procedure that I have provided informed consent. Date  Time: 12/27/2023  8:06 AM  Pre-Procedure Preparation:  Monitoring: As per clinic protocol. Respiration, ETCO2, SpO2, BP, heart rate and rhythm monitor placed and checked for adequate function Safety Precautions: Patient was assessed for positional comfort and pressure points before starting the procedure. Time-out: I initiated and conducted the Time-out before starting the procedure, as  per protocol. The patient was asked to participate by confirming the accuracy of the Time Out information. Verification of the correct person, site, and procedure were performed and confirmed by me, the nursing staff, and the patient. Time-out conducted as per Joint Commission's Universal Protocol (UP.01.01.01). Time: 0905 Start Time: 0905 hrs.  Description/Narrative of Procedure:          Laterality: See above. Targeted Levels: See above.  Rationale (medical necessity): procedure needed and proper for the  diagnosis and/or treatment of the patient's medical symptoms and needs. Procedural Technique Safety Precautions: Aspiration looking for blood return was conducted prior to all injections. At no point did we inject any substances, as a needle was being advanced. No attempts were made at seeking any paresthesias. Safe injection practices and needle disposal techniques used. Medications properly checked for expiration dates. SDV (single dose vial) medications used. Description of the Procedure: Protocol guidelines were followed. The patient was assisted into a comfortable position. The target area was identified and the area prepped in the usual manner. Skin & deeper tissues infiltrated with local anesthetic. Appropriate amount of time allowed to pass for local anesthetics to take effect. The procedure needles were then advanced to the target area. Proper needle placement secured. Negative aspiration confirmed. Solution injected in intermittent fashion, asking for systemic symptoms every 0.5cc of injectate. The needles were then removed and the area cleansed, making sure to leave some of the prepping solution back to take advantage of its long term bactericidal properties.  Technical description of process:   Third Occipital Nerve (TON) Block (MBB): The target area for the TON branch is the postero-lateral aspect of the C2-C3 articulation. Under fluoroscopic guidance, a Quincke needle was inserted  until contact was made with os over the target area. After negative aspiration for blood, 2 mL of the nerve block solution was injected without difficulty or complication. The needle was removed intact. C3 Medial Branch Nerve Block (MBB): The target area for the C3 dorsal medial articular branch is the lateral concave waist of the articular pillar of C3. Under fluoroscopic guidance, a Quincke needle was inserted until contact was made with os over the postero-lateral aspect of the articular pillar of C3 (target area). After negative aspiration for blood, 2mL of the nerve block solution was injected without difficulty or complication. The needle was removed intact. C4 Medial Branch Nerve Block (MBB): The target area for the C4 dorsal medial articular branch is the lateral concave waist of the articular pillar of C4. Under fluoroscopic guidance, a Quincke needle was inserted until contact was made with os over the postero-lateral aspect of the articular pillar of C4 (target area). After negative aspiration for blood, 2mL of the nerve block solution was injected without difficulty or complication. The needle was removed intact.   Once the entire procedure was completed, the treated area was cleaned, making sure to leave some of the prepping solution back to take advantage of its long term bactericidal properties.  Anatomy Reference Guide:      Facet Joint Innervation  C1-2 Third occipital Nerve (TON)  C2-3 TON, C3  Medial Branch  C3-4 C3, C4                     C4-5 C4, C5                     C5-6 C5, C6                     C6-7 C6, C7                     C7-T1 C7, C8                      Cervical Facet Pain Pattern overlap:   Vitals:   12/27/23 0908 12/27/23 0918 12/27/23 0928 12/27/23 0938  BP: 118/79 (!) 95/48 (!) 95/54 (!) 101/49  Pulse:  Resp: 18 13 14 14   Temp:    (!) 97.2 F (36.2 C)  TempSrc:    Temporal  SpO2: 96% 96% 99% 98%  Weight:      Height:         Start  Time: 0905 hrs. End Time: 0908 hrs.  Imaging Guidance (Spinal):         Type of Imaging Technique: Fluoroscopy Guidance (Spinal) Indication(s): Fluoroscopy guidance for needle placement to enhance accuracy in procedures requiring precise needle localization for targeted delivery of medication in or near specific anatomical locations not easily accessible without such real-time imaging assistance. Exposure Time: Please see nurses notes. Contrast: None used. Fluoroscopic Guidance: I was personally present during the use of fluoroscopy. Tunnel Vision Technique used to obtain the best possible view of the target area. Parallax error corrected before commencing the procedure. Direction-depth-direction technique used to introduce the needle under continuous pulsed fluoroscopy. Once target was reached, antero-posterior, oblique, and lateral fluoroscopic projection used confirm needle placement in all planes. Images permanently stored in EMR. Interpretation: No contrast injected. I personally interpreted the imaging intraoperatively. Adequate needle placement confirmed in multiple planes. Permanent images saved into the patient's record.  Post-operative Assessment:  Post-procedure Vital Signs:  Pulse/HCG Rate: 65(!) 54 Temp: (!) 97.2 F (36.2 C) Resp: 14 BP: (!) 101/49 SpO2: 98 %  EBL: None  Complications: No immediate post-treatment complications observed by team, or reported by patient.  Note: The patient tolerated the entire procedure well. A repeat set of vitals were taken after the procedure and the patient was kept under observation following institutional policy, for this type of procedure. Post-procedural neurological assessment was performed, showing return to baseline, prior to discharge. The patient was provided with post-procedure discharge instructions, including a section on how to identify potential problems. Should any problems arise concerning this procedure, the patient was  given instructions to immediately contact us , at any time, without hesitation. In any case, we plan to contact the patient by telephone for a follow-up status report regarding this interventional procedure.  Comments:  No additional relevant information.  Plan of Care (POC)  Orders:  Orders Placed This Encounter  Procedures   DG PAIN CLIN[facility] C-ARM 1-60 MIN NO REPORT    Intraoperative interpretation by procedural physician at [facility].    Standing Status:   Standing    Number of Occurrences:   1    Reason for exam::   Assistance in needle guidance and placement for procedures requiring needle placement in or near specific anatomical locations not easily accessible without such assistance.     Medications ordered for procedure: Meds ordered this encounter  Medications   lidocaine  (XYLOCAINE ) 2 % (with pres) injection 400 mg   lactated ringers  infusion   midazolam  (VERSED ) 5 MG/5ML injection 0.5-2 mg    Make sure Flumazenil is available in the pyxis when using this medication. If oversedation occurs, administer 0.2 mg IV over 15 sec. If after 45 sec no response, administer 0.2 mg again over 1 min; may repeat at 1 min intervals; not to exceed 4 doses (1 mg)   fentaNYL  (SUBLIMAZE ) injection 25-50 mcg    Make sure Narcan is available in the pyxis when using this medication. In the event of respiratory depression (RR< 8/min): Titrate NARCAN (naloxone) in increments of 0.1 to 0.2 mg IV at 2-3 minute intervals, until desired degree of reversal.   dexamethasone  (DECADRON ) injection 20 mg   ropivacaine  (PF) 2 mg/mL (0.2%) (NAROPIN ) injection 9 mL  Medications administered: We administered lidocaine , lactated ringers , midazolam , fentaNYL , dexamethasone , and ropivacaine  (PF) 2 mg/mL (0.2%).  See the medical record for exact dosing, route, and time of administration.    Left occipital nerve block 11/09/2023, 11/20/2023 Left TMA, C3, C4 medial branch nerve block 12/27/2023      Follow-up plan:   Return in about 3 weeks (around 01/17/2024) for PPE, F2F.     Recent Visits Date Type Provider Dept  12/21/23 Office Visit Marcelino Nurse, MD [facility]  11/20/23 Procedure visit Marcelino Nurse, MD [facility]  11/09/23 Office Visit Marcelino Nurse, MD [facility]  10/16/23 Procedure visit Marcelino Nurse, MD [facility]  Showing recent visits within past 90 days and meeting all other requirements Today's Visits Date Type Provider Dept  12/27/23 Procedure visit Marcelino Nurse, MD [facility]  Showing today's visits and meeting all other requirements Future Appointments Date Type Provider Dept  01/18/24 Appointment Marcelino Nurse, MD [facility]  Showing future appointments within next 90 days and meeting all other requirements   Disposition: Discharge home  Discharge (Date  Time): 12/27/2023; 0940 hrs.   Primary Care Physician: Marikay Eva POUR, PA Location: [facility] Note by: Nurse Marcelino, MD (TTS technology used. I apologize for any typographical errors that were not detected and corrected.) Date: 12/27/2023; Time: 10:05 AM  Disclaimer:  Medicine is not an Visual merchandiser. The only guarantee in medicine is that nothing is guaranteed. It is important to note that the decision to proceed with this intervention was based on the information collected from the patient. The Data and conclusions were drawn from the patient's questionnaire, the interview, and the physical examination. Because the information was provided in large part by the patient, it cannot be guaranteed that it has not been purposely or unconsciously manipulated. Every effort has been made to obtain as much relevant data as possible for this evaluation. It is important to note that the conclusions that lead to this procedure are derived in large part from the available data. Always take into account that the  treatment will also be dependent on availability of resources and existing treatment guidelines, considered by other Pain Management Practitioners as being common knowledge and practice, at the time of the intervention. For Medico-Legal purposes, it is also important to point out that variation in procedural techniques and pharmacological choices are the acceptable norm. The indications, contraindications, technique, and results of the above procedure should only be interpreted and judged by a Board-Certified Interventional Pain Specialist with extensive familiarity and expertise in the same exact procedure and technique.

## 2023-12-28 ENCOUNTER — Telehealth: Payer: Self-pay | Admitting: *Deleted

## 2023-12-28 NOTE — Telephone Encounter (Signed)
 No problems post procedure.

## 2024-01-08 DIAGNOSIS — M7581 Other shoulder lesions, right shoulder: Secondary | ICD-10-CM | POA: Diagnosis not present

## 2024-01-08 DIAGNOSIS — M75121 Complete rotator cuff tear or rupture of right shoulder, not specified as traumatic: Secondary | ICD-10-CM | POA: Diagnosis not present

## 2024-01-08 DIAGNOSIS — Z9889 Other specified postprocedural states: Secondary | ICD-10-CM | POA: Diagnosis not present

## 2024-01-10 ENCOUNTER — Other Ambulatory Visit: Payer: Self-pay | Admitting: Surgery

## 2024-01-10 DIAGNOSIS — Z9889 Other specified postprocedural states: Secondary | ICD-10-CM

## 2024-01-10 DIAGNOSIS — M75121 Complete rotator cuff tear or rupture of right shoulder, not specified as traumatic: Secondary | ICD-10-CM

## 2024-01-10 DIAGNOSIS — M7581 Other shoulder lesions, right shoulder: Secondary | ICD-10-CM

## 2024-01-18 ENCOUNTER — Ambulatory Visit
Attending: Student in an Organized Health Care Education/Training Program | Admitting: Student in an Organized Health Care Education/Training Program

## 2024-01-18 ENCOUNTER — Encounter: Payer: Self-pay | Admitting: Student in an Organized Health Care Education/Training Program

## 2024-01-18 VITALS — BP 131/69 | HR 62 | Temp 97.3°F | Ht 64.0 in | Wt 230.0 lb

## 2024-01-18 DIAGNOSIS — M5481 Occipital neuralgia: Secondary | ICD-10-CM | POA: Insufficient documentation

## 2024-01-18 DIAGNOSIS — M47812 Spondylosis without myelopathy or radiculopathy, cervical region: Secondary | ICD-10-CM | POA: Insufficient documentation

## 2024-01-18 NOTE — Patient Instructions (Addendum)
 [employer] Brochure given to patient

## 2024-01-18 NOTE — Progress Notes (Signed)
 PROVIDER NOTE: Interpretation of information contained herein should be left to medically-trained personnel. Specific patient instructions are provided elsewhere under Patient Instructions section of medical record. This document was created in part using AI and STT-dictation technology, any transcriptional errors that may result from this process are unintentional.  Patient: Jacqueline Rhodes  Service: E/M   PCP: Marikay Eva POUR, PA  DOB: 1956-01-28  DOS: 01/18/2024  Provider: Wallie Sherry, MD  MRN: [id number]  Delivery: Face-to-face  Specialty: Interventional Pain Management  Type: Established Patient  Setting: Ambulatory outpatient facility  Specialty designation: 09  Referring Prov.: Marikay Eva POUR, PA  Location: Outpatient office facility       History of present illness (HPI) [name], a 68 y.o. year old female, is here today because of her Cervical facet joint syndrome [M47.812]. Jacqueline Rhodes primary complain today is Neck Pain  Pertinent problems: Jacqueline Rhodes does not have any pertinent problems on file.  Pain Assessment: Severity of Chronic pain is reported as a 2 /10. Location: Neck Right, Left/Denies. Onset: More than a month ago. Quality: Discomfort, Tightness, Constant. Timing: Constant. Modifying factor(s): procedure. Vitals:  height is 5' 4 (1.626 m) and weight is 230 lb (104.3 kg). Her temperature is 97.3 F (36.3 C) (abnormal). Her blood pressure is 131/69 and her pulse is 62. Her oxygen saturation is 99%.  BMI: Estimated body mass index is 39.48 kg/m as calculated from the following:   Height as of this encounter: 5' 4 (1.626 m).   Weight as of this encounter: 230 lb (104.3 kg).  Last encounter: 12/21/2023. Last procedure: 12/27/2023.  Reason for encounter:   Post-Procedure Evaluation   Procedure: Cervical Facet Medial Branch Block(s) #1  Laterality: Left  Level: TON, C3, and C4 Medial Branch Level(s). Injecting these levels blocks the C2-3 and  C3-4 cervical facet joints.  Imaging: Fluoroscopic guidance Anesthesia: Local anesthesia (1-2% Lidocaine ) Sedation: Moderate Sedation                       DOS: 12/27/2023  Performed by: Wallie Sherry, MD  Purpose: Diagnostic/Therapeutic Indications: Cervicalgia (cervical spine axial pain) severe enough to impact quality of life or function. 1. Cervical facet joint syndrome   2. Occipital neuralgia of left side   3. Chronic pain syndrome    NAS-11 Pain score:   Pre-procedure: 7 /10   Post-procedure: 0-No pain/10     Effectiveness:  Initial hour after procedure: 100 %  Subsequent 4-6 hours post-procedure: 50 %  Analgesia past initial 6 hours: 50 %  Ongoing improvement:  Analgesic:  50% Function: Somewhat improved ROM: Somewhat improved   History of Present Illness   Jacqueline Rhodes is a 68 year old female who presents for pain management consultation for chronic occipital pain.  She experiences a 50% improvement in her headaches and pain after her left-sided nerve block detailed above and also by avoiding activities that exacerbate her symptoms, such as reaching up while assisting her husband. The pain temporarily increases during such activities but subsides with the application of heat. Her current pain level is around 2 out of 10, which she finds tolerable. The pain does not significantly bother her during the day but becomes a dull ache when she lies down at night.  She is scheduled for right shoulder surgery on December 16th at [facility]. She recalls receiving a neck block for a previous procedure.  She is scheduled for a CT scan tomorrow, following a previous MRI. She  was taken off blood thinners in October, which allowed for a nerve block procedure, during which she received IV sedation.         ROS  Constitutional: Denies any fever or chills Gastrointestinal: No reported hemesis, hematochezia, vomiting, or acute GI distress Musculoskeletal: Denies any acute onset  joint swelling, redness, loss of ROM, or weakness Neurological: Left occipital pain and left cervicalgia  Medication Review  Calcium  Fructoborate, Cetirizine HCl, Cholecalciferol, CoQ-10, EPINEPHrine , Secukinumab , UNABLE TO FIND, albuterol , aspirin , ezetimibe , famotidine , fluticasone  furoate-vilanterol, folic acid , furosemide , isosorbide  mononitrate, levothyroxine , meclizine, methotrexate, metoprolol  succinate, nitroGLYCERIN , pantoprazole , pravastatin , spironolactone , ticagrelor , and traMADol   History Review  Allergy: Jacqueline Rhodes is allergic to estrogens, losartan , codeine, flexeril [cyclobenzaprine], penicillins, terfenadine, and vicodin [hydrocodone -acetaminophen ]. Drug: Jacqueline Rhodes  reports no history of drug use. Alcohol:  reports no history of alcohol use. Tobacco:  reports that she quit smoking about 20 years ago. Her smoking use included cigarettes. She started smoking about 50 years ago. She has a 15 pack-year smoking history. She has never used smokeless tobacco. Social: Jacqueline Rhodes  reports that she quit smoking about 20 years ago. Her smoking use included cigarettes. She started smoking about 50 years ago. She has a 15 pack-year smoking history. She has never used smokeless tobacco. She reports that she does not drink alcohol and does not use drugs. Medical:  has a past medical history of Anemia, Anginal pain, Aortic atherosclerosis, Asthma, B12 deficiency, Carpal tunnel syndrome, CHF (congestive heart failure) (HCC), Chronic venous insufficiency, Complete tear of right rotator cuff, COPD (chronic obstructive pulmonary disease) (HCC), Coronary artery disease (01/07/2005), Diverticulosis, DOE (dyspnea on exertion), GERD (gastroesophageal reflux disease), History of bilateral cataract extraction (2014), Hyperlipidemia, Hypertension, Hypothyroidism, Long term current use of aspirin , Long term current use of immunosuppressive drug, Lower extremity edema, Lymphedema, NSTEMI (non-ST elevated  myocardial infarction) (HCC) (01/06/2005), OSA on CPAP, Osteoarthritis, Peripheral vascular disease, Post-menopausal osteoporosis, Psoriatic arthritis (HCC), RA (rheumatoid arthritis) (HCC), Renal insufficiency, T2DM (type 2 diabetes mellitus) (HCC), and Thyroid  cancer (HCC). Surgical: Jacqueline Rhodes  has a past surgical history that includes left shoulder surgery (1996); right elbow surgery (1999); Abdominal hysterectomy (2003); left ankle surgery (2001); Cataract extraction, bilateral (2014); Vein Surgery (Bilateral, 2015); Biopsy thyroid  (2017); LEFT HEART CATH AND CORONARY ANGIOGRAPHY (Left, 01/10/2018); Total knee arthroplasty (Right, 05/29/2018); Breast biopsy (Left, 2000); Breast biopsy (Right, 2015); LEFT HEART CATH AND CORONARY ANGIOGRAPHY (Left, 01/07/2005); LEFT HEART CATH AND CORONARY ANGIOGRAPHY (Left, 01/10/2008); LEFT HEART CATH AND CORONARY ANGIOGRAPHY (Left, 01/04/2012); Thyroidectomy, partial (Left, 09/05/2015); Coronary/Graft Acute MI Revascularization (N/A, 12/20/2022); LEFT HEART CATH AND CORONARY ANGIOGRAPHY (N/A, 12/20/2022); and Colon surgery. Family: family history includes Breast cancer in her cousin, maternal aunt, and paternal aunt; Diabetes in her mother and sister; Kidney failure in her sister.  Laboratory Chemistry Profile   Renal Lab Results  Component Value Date   BUN 20 06/16/2023   CREATININE 1.04 (H) 06/16/2023   BCR 20 11/29/2016   GFRAA >60 05/30/2018   GFRNONAA 58 (L) 06/16/2023    Hepatic Lab Results  Component Value Date   AST 16 06/16/2023   ALT 15 06/16/2023   ALBUMIN 3.6 06/16/2023   ALKPHOS 66 06/16/2023    Electrolytes Lab Results  Component Value Date   NA 138 06/16/2023   K 3.5 06/16/2023   CL 100 06/16/2023   CALCIUM  9.3 06/16/2023    Bone No results found for: VD25OH, VD125OH2TOT, CI6874NY7, CI7874NY7, 25OHVITD1, 25OHVITD2, 25OHVITD3, TESTOFREE, TESTOSTERONE  Inflammation (CRP: Acute Phase) (ESR: Chronic Phase) Lab  Results  Component Value Date   ESRSEDRATE 17 05/16/2018         Note: Above Lab results reviewed.  Recent Imaging Review  MM 3D SCREENING MAMMOGRAM BILATERAL BREAST CLINICAL DATA:  Screening.   EXAM: DIGITAL SCREENING BILATERAL MAMMOGRAM WITH TOMOSYNTHESIS AND CAD   TECHNIQUE: Bilateral screening digital craniocaudal and mediolateral oblique mammograms were obtained. Bilateral screening digital breast tomosynthesis was performed. The images were evaluated with computer-aided detection.   COMPARISON:  Previous exam(s).   ACR Breast Density Category c: The breasts are heterogeneously dense, which may obscure small masses.   FINDINGS: There are no findings suspicious for malignancy.   IMPRESSION: No mammographic evidence of malignancy. A result letter of this screening mammogram will be mailed directly to the patient.   RECOMMENDATION: Screening mammogram in one year. (Code:[id number]-B-01Y)   BI-RADS CATEGORY  1: Negative.   Electronically Signed   By: Reyes Phi M.D.   On: 11/28/2023 10:04   CLINICAL DATA:  Left-sided neck pain beginning in July 2024 with subsequent worsening. History of thyroid  cancer.   EXAM: MRI CERVICAL SPINE WITHOUT AND WITH CONTRAST   TECHNIQUE: Multiplanar and multiecho pulse sequences of the cervical spine, to include the craniocervical junction and cervicothoracic junction, were obtained without and with intravenous contrast.   CONTRAST:  10 cc Vueway    COMPARISON:  03/19/2009 MRI.  04/19/2023 CT.   FINDINGS: Alignment: Minimal scoliotic curvature.  No listhesis.   Vertebrae: No fracture or focal bone lesion. No edematous endplate marrow changes or edematous facet arthritis. Edematous change at the left-sided articulation of C1 and C2 which would likely be painful.   Cord: No cord compression or focal cord lesion.   Posterior Fossa, vertebral arteries, paraspinal tissues: Negative other than aberrant origin of the right subclavian  artery as the last vessel from the arch.   Disc levels:   The foramen magnum is widely patent. There is ordinary mild osteoarthritis of the C1-2 articulation but no encroachment upon the neural structures. Osteoarthritis of the C1-C2 articulation in the left side shows edema and enhancement which would likely relate to left-sided craniocervical pain syndromes.   C2-3: Minimal disc bulge.  No stenosis.   C3-4: Normal interspace.   C4-5: Endplate osteophytes and mild bulging of the disc. Mild facet osteoarthritis. No compressive canal or foraminal narrowing.   C5-6: Small endplate osteophytes. Facet degeneration on the right without edema. No canal stenosis. Mild right foraminal narrowing.   C6-7: Normal interspace.   C7-T1: Minimal disc bulge.  No stenosis.   IMPRESSION: 1. Osteoarthritis of the C1-C2 articulation on the left with edema and enhancement. This would likely be painful. 2. Mild degenerative changes at C4-5 and C5-6. Mild right foraminal narrowing at C5-6. 3. No evidence of metastatic disease. Note: Reviewed        Physical Exam  Vitals: BP 131/69   Pulse 62   Temp (!) 97.3 F (36.3 C)   Ht 5' 4 (1.626 m)   Wt 230 lb (104.3 kg)   SpO2 99%   BMI 39.48 kg/m  BMI: Estimated body mass index is 39.48 kg/m as calculated from the following:   Height as of this encounter: 5' 4 (1.626 m).   Weight as of this encounter: 230 lb (104.3 kg). Ideal: Ideal body weight: 54.7 kg (120 lb 9.5 oz) Adjusted ideal body weight: 74.6 kg (164 lb 5.7 oz) General appearance: Well nourished, well developed, and well hydrated. In no apparent acute distress Mental status: Alert, oriented x 3 (person, place, &  time)       Respiratory: No evidence of acute respiratory distress Eyes: PERLA  Left cervical spine pain, left occipital pain  Assessment   Diagnosis  1. Cervical facet joint syndrome   2. Occipital neuralgia of left side      Updated Problems: No problems  updated.  Plan of Care  Jacqueline Rhodes with left cervicalgia and left occipital neuralgia, with MRI notable for left C1-C2 osteoarthritis with associated edema/enhancement (likely symptomatic pain generator) and mild cervical degenerative changes at C4-5 and C5-6 with mild right foraminal narrowing at C5-6; no evidence of metastatic disease.   She is s/p left third occipital, (C3) and C4 medial branch block with clinically meaningful pain relief as detailed above, supporting a C59facet/third occipital-mediated component. Given persistent pain and positive diagnostic response, we discussed and elect to proceed with Sprint peripheral nerve stimulation (PNS) targeting the left third occipital nerve at the C3 level to reduce pain and improve function (sleep, school/activity tolerance, head positioning).    Procedure: percutaneous micro-lead placement at C3 under ultrasound  fluoroscopic guidance along the course of the third occipital/medial branch with sensory testing to ensure coverage of concordant pain; external pulse generator for a 60-day therapy trial with paresthesia-free programming and education on lead/skin care.   Risks/benefits/alternatives reviewed in detail, including infection, bleeding, skin irritation or contact dermatitis to adhesives, lead migration or breakage, dysesthesia, inadequate/temporary relief, and very rare nerve injury; alternatives include repeat diagnostic/therapeutic blocks, physical therapy with cervical stabilization and postural/ergonomic coaching, activity modification.  Follow-up: 1-2 weeks post-placement for wound check and programming optimization, then regular visits through 60 days tracking pain scores, headache days, function, and rescue-medication use; if >=50% sustained improvement with acceptable tolerance, complete the 60-day course and proceed with device removal, continue multimodal care, and reassess longer-term management based on durability of  benefit.   Jacqueline Rhodes has a current medication list which includes the following long-term medication(s): cetirizine hcl, ezetimibe , famotidine , furosemide , isosorbide  mononitrate, metoprolol  succinate, nitroglycerin , pravastatin , and spironolactone .  Pharmacotherapy (Medications Ordered): No orders of the defined types were placed in this encounter.  Orders:  Orders Placed This Encounter  Procedures   Implantable Peripheral Nerve Stimulator    Standing Status:   Future    Expected Date:   03/20/2024    Expiration Date:   01/17/2025    Scheduling Instructions:     Procedure: Temporary implantable peripheral nerve stimulator     Side:  Left C3/ occipital PNS     Sedation: Patient's choice.     Timeframe: ASAA    Location of Procedure:   [facility]     Left occipital nerve block 11/09/2023, 11/20/2023 Left TMA, C3, C4 medial branch nerve block 12/27/2023    Return in about 2 months (around 03/20/2024) for Left C3/occipital PNS , in clinic IV Versed .    Recent Visits Date Type Provider Dept  12/27/23 Procedure visit Marcelino Nurse, MD [facility]  12/21/23 Office Visit Marcelino Nurse, MD [facility]  11/20/23 Procedure visit Marcelino Nurse, MD [facility]  11/09/23 Office Visit Marcelino Nurse, MD [facility]  Showing recent visits within past 90 days and meeting all other requirements Today's Visits Date Type Provider Dept  01/18/24 Office Visit Marcelino Nurse, MD [facility]  Showing today's visits and meeting all other requirements Future Appointments Date Type Provider Dept  03/20/24 Appointment Marcelino Nurse, MD [facility]  Showing future appointments within next 90 days and meeting all  other requirements  I discussed the assessment and treatment plan with the patient. The patient was provided an opportunity to ask questions and all were answered. The patient agreed with the plan and demonstrated  an understanding of the instructions.  Patient advised to call back or seek an in-person evaluation if the symptoms or condition worsens.  I personally spent a total of 30 minutes in the care of the patient today including preparing to see the patient, getting/reviewing separately obtained history, performing a medically appropriate exam/evaluation, counseling and educating, placing orders, and documenting clinical information in the EHR.   Note by: Wallie Sherry, MD (TTS and AI technology used. I apologize for any typographical errors that were not detected and corrected.) Date: 01/18/2024; Time: 9:45 AM

## 2024-01-18 NOTE — Progress Notes (Signed)
 Safety precautions to be maintained throughout the outpatient stay will include: orient to surroundings, keep bed in low position, maintain call bell within reach at all times, provide assistance with transfer out of bed and ambulation.

## 2024-01-19 ENCOUNTER — Ambulatory Visit
Admission: RE | Admit: 2024-01-19 | Discharge: 2024-01-19 | Disposition: A | Discharge status: Home / Self Care | Source: Ambulatory Visit | Attending: Surgery | Admitting: Surgery

## 2024-01-19 DIAGNOSIS — Z01818 Encounter for other preprocedural examination: Secondary | ICD-10-CM | POA: Diagnosis not present

## 2024-01-19 DIAGNOSIS — M75121 Complete rotator cuff tear or rupture of right shoulder, not specified as traumatic: Secondary | ICD-10-CM | POA: Diagnosis not present

## 2024-01-19 DIAGNOSIS — M7581 Other shoulder lesions, right shoulder: Secondary | ICD-10-CM | POA: Diagnosis not present

## 2024-01-19 DIAGNOSIS — I700 Atherosclerosis of aorta: Secondary | ICD-10-CM | POA: Diagnosis not present

## 2024-01-19 DIAGNOSIS — M19011 Primary osteoarthritis, right shoulder: Secondary | ICD-10-CM | POA: Diagnosis not present

## 2024-01-19 DIAGNOSIS — Z9889 Other specified postprocedural states: Secondary | ICD-10-CM | POA: Diagnosis not present

## 2024-01-23 ENCOUNTER — Other Ambulatory Visit: Payer: Self-pay | Admitting: Student in an Organized Health Care Education/Training Program

## 2024-01-23 DIAGNOSIS — M7918 Myalgia, other site: Secondary | ICD-10-CM

## 2024-01-23 DIAGNOSIS — G894 Chronic pain syndrome: Secondary | ICD-10-CM

## 2024-01-23 DIAGNOSIS — M47812 Spondylosis without myelopathy or radiculopathy, cervical region: Secondary | ICD-10-CM

## 2024-01-23 DIAGNOSIS — M5481 Occipital neuralgia: Secondary | ICD-10-CM

## 2024-01-23 NOTE — Progress Notes (Signed)
?  Orders Placed This Encounter  ?Procedures  ? Ambulatory referral to Psychology  ?  Referral Priority:   Routine  ?  Referral Type:   Psychiatric  ?  Referral Reason:   Specialty Services Required  ?  Requested Specialty:   Psychology  ?  Number of Visits Requested:   1  ? ? ?

## 2024-02-05 DIAGNOSIS — E890 Postprocedural hypothyroidism: Secondary | ICD-10-CM | POA: Diagnosis not present

## 2024-02-12 ENCOUNTER — Other Ambulatory Visit: Payer: Self-pay | Admitting: Surgery

## 2024-02-20 ENCOUNTER — Other Ambulatory Visit: Payer: Self-pay

## 2024-02-20 ENCOUNTER — Inpatient Hospital Stay: Admission: RE | Admit: 2024-02-20 | Discharge: 2024-02-20 | Attending: Surgery

## 2024-02-20 VITALS — BP 130/54 | HR 62 | Resp 16 | Wt 233.5 lb

## 2024-02-20 DIAGNOSIS — M25511 Pain in right shoulder: Secondary | ICD-10-CM

## 2024-02-20 DIAGNOSIS — Z01812 Encounter for preprocedural laboratory examination: Secondary | ICD-10-CM

## 2024-02-20 DIAGNOSIS — E119 Type 2 diabetes mellitus without complications: Secondary | ICD-10-CM

## 2024-02-20 HISTORY — DX: Dyspnea, unspecified: R06.00

## 2024-02-20 HISTORY — DX: Pneumonia, unspecified organism: J18.9

## 2024-02-20 LAB — URINALYSIS, ROUTINE W REFLEX MICROSCOPIC
Bilirubin Urine: NEGATIVE
Glucose, UA: NEGATIVE mg/dL
Ketones, ur: NEGATIVE mg/dL
Nitrite: NEGATIVE
Protein, ur: NEGATIVE mg/dL
Specific Gravity, Urine: 1.023 (ref 1.005–1.030)
pH: 5 (ref 5.0–8.0)

## 2024-02-20 LAB — CBC WITH DIFFERENTIAL/PLATELET
Abs Immature Granulocytes: 0.03 K/uL (ref 0.00–0.07)
Basophils Absolute: 0.1 K/uL (ref 0.0–0.1)
Basophils Relative: 1 %
Eosinophils Absolute: 0.4 K/uL (ref 0.0–0.5)
Eosinophils Relative: 6 %
HCT: 41.6 % (ref 36.0–46.0)
Hemoglobin: 13.4 g/dL (ref 12.0–15.0)
Immature Granulocytes: 1 %
Lymphocytes Relative: 26 %
Lymphs Abs: 1.7 K/uL (ref 0.7–4.0)
MCH: 28.8 pg (ref 26.0–34.0)
MCHC: 32.2 g/dL (ref 30.0–36.0)
MCV: 89.3 fL (ref 80.0–100.0)
Monocytes Absolute: 0.3 K/uL (ref 0.1–1.0)
Monocytes Relative: 5 %
Neutro Abs: 4 K/uL (ref 1.7–7.7)
Neutrophils Relative %: 61 %
Platelets: 252 K/uL (ref 150–400)
RBC: 4.66 MIL/uL (ref 3.87–5.11)
RDW: 13.5 % (ref 11.5–15.5)
WBC: 6.5 K/uL (ref 4.0–10.5)
nRBC: 0 % (ref 0.0–0.2)

## 2024-02-20 LAB — COMPREHENSIVE METABOLIC PANEL WITH GFR
ALT: 21 U/L (ref 0–44)
AST: 21 U/L (ref 15–41)
Albumin: 3.9 g/dL (ref 3.5–5.0)
Alkaline Phosphatase: 99 U/L (ref 38–126)
Anion gap: 10 (ref 5–15)
BUN: 28 mg/dL — ABNORMAL HIGH (ref 8–23)
CO2: 29 mmol/L (ref 22–32)
Calcium: 9.2 mg/dL (ref 8.9–10.3)
Chloride: 101 mmol/L (ref 98–111)
Creatinine, Ser: 1.03 mg/dL — ABNORMAL HIGH (ref 0.44–1.00)
GFR, Estimated: 58 mL/min — ABNORMAL LOW (ref >60)
Glucose, Bld: 114 mg/dL — ABNORMAL HIGH (ref 70–99)
Potassium: 3.8 mmol/L (ref 3.5–5.1)
Sodium: 139 mmol/L (ref 135–145)
Total Bilirubin: 0.5 mg/dL (ref 0.0–1.2)
Total Protein: 6.8 g/dL (ref 6.5–8.1)

## 2024-02-20 LAB — SURGICAL PCR SCREEN
MRSA, PCR: NEGATIVE
Staphylococcus aureus: NEGATIVE

## 2024-02-20 NOTE — Patient Instructions (Addendum)
 Your procedure is scheduled on:02/27/24 - Tuesday Report to the Registration Desk on the 1st floor of the [facility]. To find out your arrival time, please call [phone] between 1PM - 3PM on: 02/26/24 - Monday If your arrival time is 6:00 am, do not arrive before that time as the [facility] entrance doors do not open until 6:00 am.  REMEMBER: Instructions that are not followed completely may result in serious medical risk, up to and including death; or upon the discretion of your surgeon and anesthesiologist your surgery may need to be rescheduled.  Do not eat food after midnight the night before surgery.  No gum chewing or hard candies.  You may however, drink CLEAR liquids up to 2 hours before you are scheduled to arrive for your surgery. Do not drink anything within 2 hours of your scheduled arrival time.  Clear liquids include: - water   In addition, your doctor has ordered for you to drink the provided:  Gatorade G2 Drinking this carbohydrate drink up to two hours before surgery helps to reduce insulin  resistance and improve patient outcomes. Please complete drinking 2 hours before scheduled arrival time.  One week prior to surgery: Stop Anti-inflammatories (NSAIDS) such as Advil, Aleve, Ibuprofen, Motrin, Naproxen, Naprosyn and Aspirin  based products such as Excedrin, Goody's Powder, BC Powder. You may take Tylenol  if needed for pain up until the day of surgery.  Stop ANY OVER THE COUNTER supplements until after surgery.  ON THE DAY OF SURGERY ONLY TAKE THESE MEDICATIONS WITH SIPS OF WATER:  isosorbide  mononitrate  levothyroxine  (SYNTHROID )  metoprolol  succinate  pantoprazole  (PROTONIX )   Use inhaler BREO ELLIPTA   on the day of surgery and bring albuterol  to the hospital.   No Alcohol for 24 hours before or after surgery.  No Smoking including e-cigarettes for 24 hours before surgery.  No chewable tobacco products for at least 6 hours before surgery.  No  nicotine patches on the day of surgery.  Do not use any recreational drugs for at least a week (preferably 2 weeks) before your surgery.  Please be advised that the combination of cocaine and anesthesia may have negative outcomes, up to and including death. If you test positive for cocaine, your surgery will be cancelled.  On the morning of surgery brush your teeth with toothpaste and water, you may rinse your mouth with mouthwash if you wish. Do not swallow any toothpaste or mouthwash.  Use CHG Soap or wipes as directed on instruction sheet.  Do not wear jewelry, make-up, hairpins, clips or nail polish.  For welded (permanent) jewelry: bracelets, anklets, waist bands, etc.  Please have this removed prior to surgery.  If it is not removed, there is a chance that hospital personnel will need to cut it off on the day of surgery.  Do not wear lotions, powders, or perfumes.   Do not shave body hair from the neck down 48 hours before surgery.  Contact lenses, hearing aids and dentures may not be worn into surgery.  Do not bring valuables to the hospital. [facility] is not responsible for any missing/lost belongings or valuables.   Notify your doctor if there is any change in your medical condition (cold, fever, infection).  Wear comfortable clothing (specific to your surgery type) to the hospital.  After surgery, you can help prevent lung complications by doing breathing exercises.  Take deep breaths and cough every 1-2 hours. Your doctor may order a device called an Incentive Spirometer to help you  take deep breaths.  If you are being admitted to the hospital overnight, leave your suitcase in the car. After surgery it may be brought to your room.  In case of increased patient census, it may be necessary for you, the patient, to continue your postoperative care in the Same Day Surgery department.  If you are being discharged the day of surgery, you will not be allowed to drive  home. You will need a responsible individual to drive you home and stay with you for 24 hours after surgery.   If you are taking public transportation, you will need to have a responsible individual with you.  Please call the Pre-admissions Testing Dept. at [phone] if you have any questions about these instructions.  Surgery Visitation Policy:  Patients having surgery or a procedure may have two visitors.  Children under the age of 32 must have an adult with them who is not the patient.  Inpatient Visitation:    Visiting hours are 7 a.m. to 8 p.m. Up to four visitors are allowed at one time in a patient room. The visitors may rotate out with other people during the day.  One visitor age 84 or older may stay with the patient overnight and must be in the room by 8 p.m.   Merchandiser, Retail to address health-related social needs:  [email or web address]  Preparing for Total Shoulder Arthroplasty  Before surgery, you can play an important role by reducing the number of germs on your skin by using the following products:  Benzoyl Peroxide Gel  o Reduces the number of germs present on the skin  o Applied twice a day to shoulder area starting two days before surgery  Chlorhexidine  Gluconate (CHG) Soap  o An antiseptic cleaner that kills germs and bonds with the skin to continue killing germs even after washing  o Used for showering the night before surgery and morning of surgery  BENZOYL PEROXIDE 5% GEL                               Please do not use if you have an allergy to benzoyl peroxide. If your skin becomes reddened/irritated stop using the benzoyl peroxide.  Starting two days before surgery, apply as follows:  1. Apply benzoyl peroxide in the morning and at night. Apply after taking a shower. If you are not taking a shower, clean entire shoulder front, back, and side along with the armpit with a clean wet washcloth.  2. Place a quarter-sized  dollop on your shoulder and rub in thoroughly, making sure to cover the front, back, and side of your shoulder, along with the armpit.   - 2 days before surgery on 12/14, use once in AM and once in PM  -  1 day before surgery on 12/15, use once in AM and once in  PM  3. Do this twice a day for two days. (Last application is the night before surgery, AFTER using the CHG soap).  4. Do NOT apply benzoyl peroxide gel on the day of surgery.    Pre-operative 4 CHG Bath Instructions   You can play a key role in reducing the risk of infection after surgery. Your skin needs to be as free of germs as possible. You can reduce the number of germs on your skin by washing with CHG (chlorhexidine  gluconate) soap before surgery. CHG is an antiseptic soap that kills germs and continues to kill  germs even after washing.   DO NOT use if you have an allergy to chlorhexidine /CHG or antibacterial soaps. If your skin becomes reddened or irritated, stop using the CHG and notify one of our RNs at [phone].   Please shower with the CHG soap starting 4 days before surgery using the following schedule: 12/12 - 12/15.    Please keep in mind the following:  DO NOT shave, including legs and underarms, starting the day of your first shower.   You may shave your face at any point before/day of surgery.  Place clean sheets on your bed the day you start using CHG soap. Use a clean washcloth (not used since being washed) for each shower. DO NOT sleep with pets once you start using the CHG.   CHG Shower Instructions:  If you choose to wash your hair and private area, wash first with your normal shampoo/soap.  After you use shampoo/soap, rinse your hair and body thoroughly to remove shampoo/soap residue.  Turn the water OFF and apply about 3 tablespoons (45 ml) of CHG soap to a CLEAN washcloth.  Apply CHG soap ONLY FROM YOUR NECK DOWN TO YOUR TOES (washing for 3-5 minutes)  DO NOT use CHG soap on face, private areas,  open wounds, or sores.  Pay special attention to the area where your surgery is being performed.  If you are having back surgery, having someone wash your back for you may be helpful. Wait 2 minutes after CHG soap is applied, then you may rinse off the CHG soap.  Pat dry with a clean towel  Put on clean clothes/pajamas   If you choose to wear lotion, please use ONLY the CHG-compatible lotions on the back of this paper.     Additional instructions for the day of surgery: DO NOT APPLY any lotions, deodorants, cologne, or perfumes.   Put on clean/comfortable clothes.  Brush your teeth.  Ask your nurse before applying any prescription medications to the skin.      CHG Compatible Lotions   Aveeno Moisturizing lotion  Cetaphil Moisturizing Cream  Cetaphil Moisturizing Lotion  Clairol Herbal Essence Moisturizing Lotion, Dry Skin  Clairol Herbal Essence Moisturizing Lotion, Extra Dry Skin  Clairol Herbal Essence Moisturizing Lotion, Normal Skin  Curel Age Defying Therapeutic Moisturizing Lotion with Alpha Hydroxy  Curel Extreme Care Body Lotion  Curel Soothing Hands Moisturizing Hand Lotion  Curel Therapeutic Moisturizing Cream, Fragrance-Free  Curel Therapeutic Moisturizing Lotion, Fragrance-Free  Curel Therapeutic Moisturizing Lotion, Original Formula  Eucerin Daily Replenishing Lotion  Eucerin Dry Skin Therapy Plus Alpha Hydroxy Crme  Eucerin Dry Skin Therapy Plus Alpha Hydroxy Lotion  Eucerin Original Crme  Eucerin Original Lotion  Eucerin Plus Crme Eucerin Plus Lotion  Eucerin TriLipid Replenishing Lotion  Keri Anti-Bacterial Hand Lotion  Keri Deep Conditioning Original Lotion Dry Skin Formula Softly Scented  Keri Deep Conditioning Original Lotion, Fragrance Free Sensitive Skin Formula  Keri Lotion Fast Absorbing Fragrance Free Sensitive Skin Formula  Keri Lotion Fast Absorbing Softly Scented Dry Skin Formula  Keri Original Lotion  Keri Skin Renewal Lotion Keri Silky  Smooth Lotion  Keri Silky Smooth Sensitive Skin Lotion  Nivea Body Creamy Conditioning Oil  Nivea Body Extra Enriched Teacher, Adult Education Moisturizing Lotion Nivea Crme  Nivea Skin Firming Lotion  NutraDerm 30 Skin Lotion  NutraDerm Skin Lotion  NutraDerm Therapeutic Skin Cream  NutraDerm Therapeutic Skin Lotion  ProShield Protective Hand Cream  Provon moisturizing lotion   Preoperative Educational  Videos for Total Hip, Knee and Shoulder Replacements  To better prepare for surgery, please view our videos that explain the physical activity and discharge planning required to have the best surgical recovery at [facility].  [email or web address]  Questions? Call [phone] or email [email or web address]    POLAR CARE INFORMATION  [email or web address]  How to use [facility] Polar Care [facility] Therapy System?  YouTube   [email or web address]  OPERATING INSTRUCTIONS  Start the product With dry hands, connect the transformer to the electrical connection located on the top of the cooler. Next, plug the transformer into an appropriate electrical outlet. The unit will automatically start running at this point.  To stop the pump, disconnect electrical power.  Unplug to stop the product when not in use. Unplugging the Polar Care unit turns it off. Always unplug immediately after use. Never leave it plugged in while unattended. Remove pad.    FIRST ADD WATER TO FILL LINE, THEN ICE---Replace ice when existing ice is almost melted  1 Discuss Treatment with your Licensed Health Care Practitioner and Use Only as Prescribed 2 Apply Insulation Barrier & Cold Therapy Pad 3 Check for Moisture 4 Inspect Skin Regularly  Tips and Trouble Shooting Usage Tips 1. Use cubed or chunked ice for optimal performance. 2. It is recommended to drain the Pad between  uses. To drain the pad, hold the Pad upright with the hose pointed toward the ground. Depress the black plunger and allow water to drain out. 3. You may disconnect the Pad from the unit without removing the pad from the affected area by depressing the silver tabs on the hose coupling and gently pulling the hoses apart. The Pad and unit will seal itself and will not leak. Note: Some dripping during release is normal. 4. DO NOT RUN PUMP WITHOUT WATER! The pump in this unit is designed to run with water. Running the unit without water will cause permanent damage to the pump. 5. Unplug unit before removing lid.  TROUBLESHOOTING GUIDE Pump not running, Water not flowing to the pad, Pad is not getting cold 1. Make sure the transformer is plugged into the wall outlet. 2. Confirm that the ice and water are filled to the indicated levels. 3. Make sure there are no kinks in the pad. 4. Gently pull on the blue tube to make sure the tube/pad junction is straight. 5. Remove the pad from the treatment site and ll it while the pad is lying at; then reapply. 6. Confirm that the pad couplings are securely attached to the unit. Listen for the double clicks (Figure 1) to confirm the pad couplings are securely attached.  Leaks    Note: Some condensation on the lines, controller, and pads is unavoidable, especially in warmer climates. 1. If using a Breg Polar Care Cold Therapy unit with a detachable Cold Therapy Pad, and a leak exists (other than condensation on the lines) disconnect the pad couplings. Make sure the silver tabs on the couplings are depressed before reconnecting the pad to the pump hose; then confirm both sides of the coupling are properly clicked in. 2. If the coupling continues to leak or a leak is detected in the pad itself, stop using it and call [employer] Customer Care at [phone].  Cleaning After use, empty and dry the unit with a soft cloth. Warm water and mild detergent may be used  occasionally to clean the pump and tubes.  WARNING: The Polar Care Cube can be cold enough to cause serious  injury, including full skin necrosis. Follow these Operating Instructions, and carefully read the Product Insert (see pouch on side of unit) and the Cold Therapy Pad Fitting Instructions (provided with each Cold Therapy Pad) prior to use.

## 2024-02-21 ENCOUNTER — Encounter: Payer: Self-pay | Admitting: Surgery

## 2024-02-21 NOTE — Progress Notes (Signed)
 Perioperative / Anesthesia Services  Pre-Admission Testing Clinical Review / Pre-Operative Anesthesia Consult  Date: 02/21/24  PATIENT DEMOGRAPHICS: Name: Jacqueline Rhodes DOB: 1956-01-15 MRN:   [id number]  Note: Available PAT nursing documentation and vital signs have been reviewed. Clinical nursing staff has updated patient's PMH/PSHx, current medication list, and drug allergies/intolerances to ensure complete and comprehensive history available to assist care teams in MDM as it pertains to the aforementioned surgical procedure and anticipated anesthetic course. Extensive review of available clinical information personally performed. Nursing documentation reviewed. [place] PM[facility] and PSHx updated with any diagnoses and/or procedures that I have knowledge of that may have been inadvertently omitted during her intake with the pre-admission testing department's nursing staff.  PLANNED SURGICAL PROCEDURE(S):   Case: [id number] Date/Time: 02/27/24 1016   Procedure: ARTHROPLASTY, SHOULDER, TOTAL, REVERSE (Right: Shoulder)   Anesthesia type: Choice   Diagnosis:      Status post right rotator cuff repair [Z98.890]     Rotator cuff tendinitis, right [M75.81]     Nontraumatic complete tear of right rotator cuff [M75.121]   Pre-op diagnosis:      Status post right rotator cuff repair Z98.890     Rotator cuff tendinitis, right M75.81     Nontraumatic complete tear of right rotator cuff M75.121   Location: [facility] OR ROOM 02 / [facility]   Surgeons: Edie Norleen PARAS, MD        CLINICAL DISCUSSION: Jacqueline Rhodes is a 68 y.o. female who is submitted for pre-surgical anesthesia review and clearance prior to her undergoing the above procedure. Patient is a Former Smoker (15 pack years; quit 03/2003). Pertinent PMH includes: CAD, NSTEMI (2006), anterolateral STEMI (2024), HFmrEF, PVD, aortic atherosclerosis, BILATERAL carotid artery disease, angina, HTN, HLD, T2DM, hypothyroidism (s/p  hemithyroidectomy for thyroid  cancer), CKD-III, asthma, COPD, OSAH (noncompliant with nocturnal PAP therapy), GERD (on daily PPI), hiatal hernia, anemia, OA, RA, psoriatic arthritis, chronic pain syndrome, cervical DDD, lymphedema, RIGHT rotator cuff tear   Patient is followed by cardiology Andrea, MD). She was last seen in the cardiology clinic on 12/20/2023; notes reviewed. At the time of her clinic visit, patient doing well overall from a cardiovascular perspective. Patient denied any chest pain, shortness of breath, PND, orthopnea, palpitations, significant peripheral edema, weakness, fatigue, vertiginous symptoms, or presyncope/syncope. Patient with a past medical history significant for cardiovascular diagnoses. Documented physical exam was grossly benign, providing no evidence of acute exacerbation and/or decompensation of the patient's known cardiovascular conditions.   Patient underwent diagnostic LEFT heart catheterization on 01/10/2018 revealing a normal left ventricular systolic function with an EF of 55%.  There was multivessel CAD; 20% proximal RCA, 50% proximal LAD, and 55% ostial D1.  Given that there was no significant stenosis, the decision was made to defer intervention opting for aggressive secondary prevention through medical management.  Patient suffered an anterolateral STEMI on 12/20/2022.  Study revealed multivessel CAD; 100% mid LAD with 70% D2, 40% mid-distal LAD, 90% D2-1, 100% D2-2, and 15% mid LCx.  Patient subsequently underwent PCI placing a 2.5 x 18 mm Onyx Frontier DES x 1 to the mid LAD with D2 side branch.  Procedure yielded excellent angiographic result and TIMI-3 flow.  Most recent TTE performed on 12/22/2022 revealed a mildly reduced left ventricular systolic function with an EF of 40-45%. The apical lateral segment, apical septal segment, and apex are hypokinetic. Left ventricular diastolic Doppler parameters were normal. Right ventricular size and function normal  with a TAPSE measuring 2.2  cm  (normal range >/= 1.6 cm). There was mild to moderate aortic valve regurgitation.  All transvalvular gradients were noted to be normal providing no evidence of hemodynamically significant valvular stenosis. Aorta normal in size with no evidence of ectasia or aneurysmal dilatation.  Blood pressure reasonably controlled at 132/78 mmHg on currently prescribed diuretic (furosemide ), nitrate (isosorbide  mononitrate), MRA (spironolactone ) and beta-blocker (metoprolol  succinate) therapies. In addition to her scheduled nitrates, patient has a supply of short acting nitrates (NTG) to use on a as needed basis for recurrent significant angina/anginal equivalent symptoms; denied recent use. Patient is on ezetimibe  + red yeast rice extract for her HLD diagnosis and ASCVD prevention.  T2DM well-controlled currently prescribed regimen; last HgbA1c was 6.4% when checked on 10/25/2023.Jacqueline Rhodes  Patient does have an OSAH diagnosis, however she is noncompliant with prescribed nocturnal PAP therapy. Patient is able to complete all of her  ADL/IADLs without cardiovascular limitation.  Per the DASI, patient is able to achieve at least 4 METS of physical activity without experiencing any significant degree of angina/anginal equivalent symptoms. No changes were made to her medication regimen during her visit with cardiology.  Patient scheduled to follow-up with outpatient cardiology in 4 months or sooner if needed.  Jacqueline Rhodes is scheduled for an elective ARTHROPLASTY, SHOULDER, TOTAL, REVERSE (Right: Shoulder) on 02/27/2024 with Dr. Norleen JINNY Maltos, MD. Given patient's past medical history significant for cardiovascular diagnoses, presurgical cardiac clearance was sought by the PAT team. Per cardiology, this patient is optimized for surgery and may proceed with the planned procedural course with a MODERATE risk of significant perioperative cardiovascular complications. .   In review of the patient's  medication reconciliation, it is noted that she is on daily oral antithrombotic therapy. Given that patient's past medical history is significant for cardiovascular diagnoses, including but not limited to CAD, orthopedics has cleared patient to continue her daily low dose ASA throughout her perioperative course.  Patient has been updated on these directives from her specialty care providers by the PAT team.  Patient denies previous perioperative complications with anesthesia in the past. In review her EMR, it is noted that patient underwent a general anesthetic course here at [facility] (ASA III) in 07/2022 without documented complications.   MOST RECENT VITAL SIGNS:    02/20/2024    9:51 AM 01/18/2024    8:36 AM 12/27/2023    9:38 AM  Vitals with BMI  Height  5' 4   Weight 233 lbs 7 oz 230 lbs   BMI  39.46   Systolic 130 131 898  Diastolic 54 69 49  Pulse 62 62    PROVIDERS/SPECIALISTS: NOTE: Primary physician provider listed below. Patient may have been seen by APP or partner within same practice.   PROVIDER ROLE / SPECIALTY LAST OV  Poggi, Norleen JINNY, MD Orthopedics (Surgeon) 01/08/2024  Marikay Eva POUR, [place] Primary Care Provider 11/01/2023  Ammon Blunt, MD Cardiology 12/20/2023   Marcelino Nurse, MD Pain Management 01/23/2024  Theotis Chancellor, MD Pulmonary Medicine 02/14/2024  Tobie Solo, MD Rheumatology 12/20/2023  Cherilyn Ned, MD Endocrinology 10/25/2023   ALLERGIES: Allergies  Allergen Reactions   Estrogens Other (See Comments)    Blood clot   Losartan  Swelling    Tongue swelling   Banana     Was told by allergist   Codeine Other (See Comments)    Migraine   Flexeril [Cyclobenzaprine] Hives   No [employer] Allergies     Bananas/grapes--via allergy test  Other     Grapes was told by allergist   Penicillins Hives    Has patient had a PCN reaction causing immediate rash, facial/tongue/throat swelling, SOB or  lightheadedness with hypotension: yes Has patient had a PCN reaction causing severe rash involving mucus membranes or skin necrosis: no Has patient had a PCN reaction that required hospitalization:  no Has patient had a PCN reaction occurring within the last 10 years: no If all of the above answers are NO, then may proceed with Cephalosporin use.    Statins Swelling    Swelling of tongue   Terfenadine Hives   Vicodin [Hydrocodone -Acetaminophen ] Hives    CURRENT HOME MEDICATIONS: No current facility-administered medications for this encounter.    albuterol  (PROVENTIL  HFA;VENTOLIN  HFA) 108 (90 Base) MCG/ACT inhaler   aspirin  81 MG chewable tablet   B Complex Vitamins (B COMPLEX PO)   cetirizine (ZYRTEC) 10 MG tablet   Coenzyme Q10 (COQ-10 PO)   EPINEPHrine  (EPIPEN  JR 2-PAK) 0.15 MG/0.3ML injection   ezetimibe  (ZETIA ) 10 MG tablet   famotidine  (PEPCID ) 20 MG tablet   fluticasone  furoate-vilanterol (BREO ELLIPTA ) 100-25 MCG/INH AEPB   folic acid  (FOLVITE ) 1 MG tablet   furosemide  (LASIX ) 20 MG tablet   Guselkumab (TREMFYA [place])   isosorbide  mononitrate (IMDUR ) 60 MG 24 hr tablet   levothyroxine  (SYNTHROID ) 125 MCG tablet   methotrexate (RHEUMATREX) 2.5 MG tablet   metoprolol  succinate (TOPROL -XL) 25 MG 24 hr tablet   nitroGLYCERIN  (NITROSTAT ) 0.4 MG SL tablet   pantoprazole  (PROTONIX ) 40 MG tablet   potassium chloride  (KLOR-CON ) 10 MEQ tablet   Red Yeast Rice Extract (RED YEAST RICE PO)   spironolactone  (ALDACTONE ) 25 MG tablet   Vitamin D-Vitamin K (K2 PLUS D3 PO)   HISTORY: Past Medical History:  Diagnosis Date   Anemia    Anginal pain    Angioedema    a.) seeing allergist; felt to be secondary to losartan  vs statin   Aortic atherosclerosis    Asthma    B12 deficiency    Bilateral carotid artery disease    Carpal tunnel syndrome    Chronic heart failure with mildly reduced ejection fraction (HFmrEF) (HCC)    Chronic pain syndrome    Chronic venous insufficiency     CKD (chronic kidney disease), stage III (HCC)    Complete tear of right rotator cuff    COPD (chronic obstructive pulmonary disease) (HCC)    Coronary artery disease 01/07/2005   a.) LHC 01/07/2005 (NSTEMI): LHC 60% pLAD, 70% D1, 30% pLCx, 20% pRCA, 25% dRCA - med mgmt; b.) LHC ([place] ) 01/10/2008: 25/60% pLAD, 70% D1, 20% pLCx, 20% mRCA --> FFR normal --> med mgmt; c.) LHC ([place] ) 01/04/2012:: 40/60% pLAD, 70% D1, 25% pLCx, 20% pRCA, 20% mRCA, 25% dRCA --> FFR normal --> med mgmt; d.) LHC ([place] ) 01/10/2018: 20% pRCA, 50% pLAD, 55% oD1 -- med mgmt   Diverticulosis    DOE (dyspnea on exertion)    Dyspnea    GERD (gastroesophageal reflux disease)    History of bilateral cataract extraction 2014   Hyperlipidemia    Hypertension    Hypothyroidism    a.) s/p LEFT hemithyroidectomy; on levothyroxine    Long term current use of aspirin     Long term current use of immunosuppressive drug    a.) MTX + guselkumab for RA/psoriatric arthritis diagnoses   Lymphedema    NSTEMI (non-ST elevated myocardial infarction) (HCC) 01/06/2005   a.) LHC 01/07/2005: 60% pLAD, 70% D1, 30% pLCx, 20% pRCA, 25% dRCA --  med mgmt   OSA on CPAP    a.) compliance issues with prescribed nocturnal PAP therapy   Osteoarthritis    Peripheral vascular disease    Pneumonia    Post-menopausal osteoporosis    Psoriatic arthritis (HCC)    PSVT (paroxysmal supraventricular tachycardia)    RA (rheumatoid arthritis) (HCC)    ST elevation myocardial infarction (STEMI) of anterolateral wall (HCC)    T2DM (type 2 diabetes mellitus) (HCC)    Thyroid  cancer (HCC)    a.) s/p LEFT hemithyroidectomy 08/2015   Past Surgical History:  Procedure Laterality Date   ABDOMINAL HYSTERECTOMY  2003   BIOPSY THYROID   2017   x 2   BREAST BIOPSY Left 2000   benign   BREAST BIOPSY Right 2015   benign   CATARACT EXTRACTION, BILATERAL  2014   COLON SURGERY     CORONARY/GRAFT ACUTE MI REVASCULARIZATION N/A 12/20/2022   Procedure: Coronary/Graft  Acute MI Revascularization;  Surgeon: Mady Bruckner, MD;  Location: [facility] INVASIVE CV LAB;  Service: Cardiovascular;  Laterality: N/A;   left ankle surgery  2001   LEFT HEART CATH AND CORONARY ANGIOGRAPHY Left 01/10/2018   Procedure: LEFT HEART CATH AND CORONARY ANGIOGRAPHY;  Surgeon: Hester Wolm PARAS, MD;  Location: [facility] INVASIVE CV LAB;  Service: Cardiovascular;  Laterality: Left;   LEFT HEART CATH AND CORONARY ANGIOGRAPHY Left 01/07/2005   Procedure: LEFT HEART CATH AND CORONARY ANGIOGRAPHY; Location: [facility]; Surgeon: Wolm Hester, MD   LEFT HEART CATH AND CORONARY ANGIOGRAPHY Left 01/10/2008   Procedure: LEFT HEART CATH AND CORONARY ANGIOGRAPHY; Location: [facility]; Surgeon: Wolm Hester, MD / Margie Lovelace, MD   LEFT HEART CATH AND CORONARY ANGIOGRAPHY Left 01/04/2012   Procedure: LEFT HEART CATH AND CORONARY ANGIOGRAPHY; Location: [facility]; Surgeon: Wolm Hester, MD / Marsa Dooms, MD   LEFT HEART CATH AND CORONARY ANGIOGRAPHY N/A 12/20/2022   Procedure: LEFT HEART CATH AND CORONARY ANGIOGRAPHY;  Surgeon: Mady Bruckner, MD;  Location: [facility] INVASIVE CV LAB;  Service: Cardiovascular;  Laterality: N/A;   left shoulder surgery  1996   right elbow surgery  1999   THYROIDECTOMY, PARTIAL Left 09/05/2015   Procedure: LEFT HEMITHYROIDECTOMY   TOTAL KNEE ARTHROPLASTY Right 05/29/2018   Procedure: TOTAL KNEE ARTHROPLASTY-RIGHT;  Surgeon: Kathlynn Sharper, MD;  Location: [facility] ORS;  Service: Orthopedics;  Laterality: Right;   VEIN SURGERY Bilateral 2015   legs   Family History  Problem Relation Age of Onset   Breast cancer Cousin        paternal side   Breast cancer Paternal Aunt        2 PATs   Breast cancer Maternal Aunt    Kidney failure Sister    Diabetes Sister    Diabetes Mother    Kidney cancer Neg Hx    Bladder Cancer Neg Hx    Social History   Tobacco Use   Smoking status: Former    Current packs/day: 0.00    Average packs/day: 0.5 packs/day for 30.0 years (15.0 ttl  pk-yrs)    Types: Cigarettes    Start date: 80    Quit date: 2005    Years since quitting: 20.9   Smokeless tobacco: Never  Substance Use Topics   Alcohol use: No   LABS:  Hospital Outpatient Visit on 02/20/2024  Component Date Value Ref Range Status   WBC 02/20/2024 6.5  4.0 - 10.5 K/uL Final   RBC 02/20/2024 4.66  3.87 - 5.11 MIL/uL Final   Hemoglobin 02/20/2024 13.4  12.0 - 15.0 g/dL  Final   HCT 02/20/2024 41.6  36.0 - 46.0 % Final   MCV 02/20/2024 89.3  80.0 - 100.0 fL Final   MCH 02/20/2024 28.8  26.0 - 34.0 pg Final   MCHC 02/20/2024 32.2  30.0 - 36.0 g/dL Final   RDW 87/90/7974 13.5  11.5 - 15.5 % Final   Platelets 02/20/2024 252  150 - 400 K/uL Final   nRBC 02/20/2024 0.0  0.0 - 0.2 % Final   Neutrophils Relative % 02/20/2024 61  % Final   Neutro Abs 02/20/2024 4.0  1.7 - 7.7 K/uL Final   Lymphocytes Relative 02/20/2024 26  % Final   Lymphs Abs 02/20/2024 1.7  0.7 - 4.0 K/uL Final   Monocytes Relative 02/20/2024 5  % Final   Monocytes Absolute 02/20/2024 0.3  0.1 - 1.0 K/uL Final   Eosinophils Relative 02/20/2024 6  % Final   Eosinophils Absolute 02/20/2024 0.4  0.0 - 0.5 K/uL Final   Basophils Relative 02/20/2024 1  % Final   Basophils Absolute 02/20/2024 0.1  0.0 - 0.1 K/uL Final   Immature Granulocytes 02/20/2024 1  % Final   Abs Immature Granulocytes 02/20/2024 0.03  0.00 - 0.07 K/uL Final   Performed at [facility], [address] [place], [place] [address]   Color, Urine 02/20/2024 YELLOW (A)  YELLOW Final   APPearance 02/20/2024 HAZY (A)  CLEAR Final   Specific Gravity, Urine 02/20/2024 1.023  1.005 - 1.030 Final   pH 02/20/2024 5.0  5.0 - 8.0 Final   Glucose, UA 02/20/2024 NEGATIVE  NEGATIVE mg/dL Final   Hgb urine dipstick 02/20/2024 SMALL (A)  NEGATIVE Final   Bilirubin Urine 02/20/2024 NEGATIVE  NEGATIVE Final   Ketones, ur 02/20/2024 NEGATIVE  NEGATIVE mg/dL Final   Protein, ur 87/90/7974 NEGATIVE  NEGATIVE mg/dL Final   Nitrite 87/90/7974  NEGATIVE  NEGATIVE Final   Leukocytes,Ua 02/20/2024 MODERATE (A)  NEGATIVE Final   RBC / HPF 02/20/2024 0-5  0 - 5 RBC/hpf Final   WBC, UA 02/20/2024 6-10  0 - 5 WBC/hpf Final   Bacteria, UA 02/20/2024 RARE (A)  NONE SEEN Final   Squamous Epithelial / HPF 02/20/2024 11-20  0 - 5 /HPF Final   Mucus 02/20/2024 PRESENT   Final   Performed at [facility], [address] [place], [place] [address]   Sodium 02/20/2024 139  135 - 145 mmol/L Final   Potassium 02/20/2024 3.8  3.5 - 5.1 mmol/L Final   Chloride 02/20/2024 101  98 - 111 mmol/L Final   CO2 02/20/2024 29  22 - 32 mmol/L Final   Glucose, Bld 02/20/2024 114 (H)  70 - 99 mg/dL Final   Glucose reference range applies only to samples taken after fasting for at least 8 hours.   BUN 02/20/2024 28 (H)  8 - 23 mg/dL Final   Creatinine, Ser 02/20/2024 1.03 (H)  0.44 - 1.00 mg/dL Final   Calcium  02/20/2024 9.2  8.9 - 10.3 mg/dL Final   Total Protein 87/90/7974 6.8  6.5 - 8.1 g/dL Final   Albumin 87/90/7974 3.9  3.5 - 5.0 g/dL Final   AST 87/90/7974 21  15 - 41 U/L Final   ALT 02/20/2024 21  0 - 44 U/L Final   Alkaline Phosphatase 02/20/2024 99  38 - 126 U/L Final   Total Bilirubin 02/20/2024 0.5  0.0 - 1.2 mg/dL Final   GFR, Estimated 02/20/2024 58 (L)  >60 mL/min Final   Comment: (NOTE) Calculated using the CKD-EPI Creatinine  Equation (2021)    Anion gap 02/20/2024 10  5 - 15 Final   Performed at [facility], [address] [place], [place] [address]   MRSA, PCR 02/20/2024 NEGATIVE  NEGATIVE Final   Staphylococcus aureus 02/20/2024 NEGATIVE  NEGATIVE Final   Comment: (NOTE) The Xpert SA Assay (FDA approved for NASAL specimens in patients 40 years of age and older), is one component of a comprehensive surveillance program. It is not intended to diagnose infection nor to guide or monitor treatment. Performed at [facility], [address] [place], [place] [address]     ECG: Date: 02/20/2024  Time  ECG obtained: 1032 AM Rate: 58 bpm Rhythm: sinus bradycardia Axis (leads I and aVF): normal Intervals: PR 192 ms. QRS 80 ms. QTc 396 ms. ST segment and T wave changes: No evidence of acute T wave abnormalities or significant ST segment elevation or depression.  Evidence of a possible, age undetermined, prior infarct:  Yes; anterior Comparison: Similar to previous tracing obtained on 06/16/2023   IMAGING / PROCEDURES: CT SHOULDER RIGHT WO CONTRAST performed on 01/19/2024 Moderate osteoarthritis of the glenohumeral joint. Suggestion of a complete tear of the supraspinatus tendon. Aortic atherosclerosis  MR CERVICAL SPINE W WO CONTRAST performed on 07/12/2023 Osteoarthritis of the C1-C2 articulation on the left with edema and enhancement. This would likely be painful. Mild degenerative changes at C4-5 and C5-6. Mild right foraminal narrowing at C5-6. No evidence of metastatic disease.  CT RENAL STONE STUDY performed on 05/17/2023 No evidence of urolithiasis, hydronephrosis, or other acute findings. Colonic diverticulosis, without radiographic evidence of diverticulitis. Stable small hiatal hernia.  CAROTID ULTRASOUND performed on 03/16/2023 1-49% stenosis in the left internal carotid artery Trace stenosis in the RIGHT internal carotid artery  LONG TERM CARDIAC EVENT MONITOR STUDY performed on 03/14/2023 - 03/17/2023 Predominant sinus rhythm with mean heart rate 75 bpm, range 47 to 121 bpm Occasional PVCs and PACs One 10-second run of SVT at 162 bpM No sustained arrhythmias or prolonged pauses No correlation with diary entries.   TRANSTHORACIC ECHOCARDIOGRAM performed on 12/22/2022 Left ventricular ejection fraction, by estimation, is 40 to 45%. The left ventricle has mildly decreased function. The left ventricle demonstrates regional wall motion abnormalities (see scoring diagram/findings for description). Left ventricular diastolic parameters were normal.  Right ventricular  systolic function is normal. The right ventricular size is normal.  The mitral valve is normal in structure. Mild mitral valve regurgitation. No evidence of mitral stenosis.  The aortic valve is normal in structure. Aortic valve regurgitation is mild to moderate. No aortic stenosis is present.  The inferior vena cava is normal in size with greater than 50% respiratory variability, suggesting right atrial pressure of 3 mmHg.   LEFT HEART CATHETERIZATION AND CORONARY ANGIOGRAPHY performed on 12/20/2022 Severe single-vessel coronary artery disease with thrombotic occlusion of the mid LAD involving moderate-caliber D2 branch.  There is mild, nonobstructive CAD involving the LCx and RCA. Moderately to severely reduced left ventricular systolic function (LVEF 30-35%) with mid and apical anterior akinesis. Moderately elevated left ventricular filling pressure (LVEDP 25 mmHg) consistent with acute systolic heart failure. Successful PCI to mid LAD using Onyx Frontier 2.5 x 18 mm drug-eluting stent with 0% residual stenosis and TIMI-3 flow.  Jailed diagonal branch was also angioplastied with improvement in ostial stenosis from 70%->50% and reestablishment of TIMI I flow in the mid/distal vessel Recommendations: Continue cangrelor  infusion for 2 hours. Dual antiplatelet therapy with aspirin  and ticagrelor  for at least 12 months. Titrate  nitroglycerin  for relief of chest pain. Aggressive secondary prevention of coronary artery disease. Further evaluation and management of angioedema per primary team Gentle diuresis and initiation of goal-directed medical therapy as tolerated. Obtain echocardiogram.  If LVEF remains less than 35%, consider LifeVest at discharge.    IMPRESSION AND PLAN: Jacqueline Rhodes has been referred for pre-anesthesia review and clearance prior to her undergoing the planned anesthetic and procedural courses. Available labs, pertinent testing, and imaging results were personally  reviewed by me in preparation for upcoming operative/procedural course. [facility] medical record has been updated following extensive record review and patient interview with PAT staff.   This patient has been appropriately cleared by cardiology with an overall MODERATE risk of patient experiencing significant perioperative cardiovascular complications. here at [facility]. Based on clinical review performed today (02/21/24), barring any significant acute changes in the patient's overall condition, it is anticipated that she will be able to proceed with the planned surgical intervention. Any acute changes in clinical condition may necessitate her procedure being postponed and/or cancelled. Patient will meet with anesthesia team (MD and/or CRNA) on the day of her procedure for preoperative evaluation/assessment. Questions regarding anesthetic course will be fielded at that time.   Pre-surgical instructions were reviewed with the patient during his PAT appointment, and questions were fielded to satisfaction by PAT clinical staff. She has been instructed on which medications that she will need to hold prior to surgery, as well as the ones that have been deemed safe/appropriate to take on the day of her procedure. As part of the general education provided by PAT, patient made aware both verbally and in writing, that she would need to abstain from the use of any illegal substances during her perioperative course. She was advised that failure to follow the provided instructions could necessitate case cancellation or result in serious perioperative complications up to and including death. Patient encouraged to contact PAT and/or her surgeon's office to discuss any questions or concerns that may arise prior to surgery; verbalized understanding.   Dorise Pereyra, MSN, APRN, FNP-C, CEN [facility]  Perioperative Services Nurse Practitioner Phone: [phone] Fax:  [phone] 02/21/24 8:48 AM  NOTE: This note has been prepared using Dragon dictation software. Despite my best ability to proofread, there is always the potential that unintentional transcriptional errors may still occur from this process.

## 2024-02-27 ENCOUNTER — Encounter: Payer: Self-pay | Admitting: Urgent Care

## 2024-02-27 ENCOUNTER — Ambulatory Visit

## 2024-02-27 ENCOUNTER — Encounter: Admission: RE | Disposition: A | Payer: Self-pay | Attending: Surgery

## 2024-02-27 ENCOUNTER — Other Ambulatory Visit: Payer: Self-pay

## 2024-02-27 ENCOUNTER — Encounter: Payer: Self-pay | Admitting: Surgery

## 2024-02-27 ENCOUNTER — Ambulatory Visit: Payer: Self-pay | Admitting: Urgent Care

## 2024-02-27 ENCOUNTER — Ambulatory Visit: Admission: RE | Admit: 2024-02-27 | Discharge: 2024-02-27 | Disposition: A | Attending: Surgery | Admitting: Surgery

## 2024-02-27 DIAGNOSIS — R519 Headache, unspecified: Secondary | ICD-10-CM | POA: Diagnosis not present

## 2024-02-27 DIAGNOSIS — I130 Hypertensive heart and chronic kidney disease with heart failure and stage 1 through stage 4 chronic kidney disease, or unspecified chronic kidney disease: Secondary | ICD-10-CM | POA: Diagnosis not present

## 2024-02-27 DIAGNOSIS — Z87891 Personal history of nicotine dependence: Secondary | ICD-10-CM | POA: Diagnosis not present

## 2024-02-27 DIAGNOSIS — M7581 Other shoulder lesions, right shoulder: Secondary | ICD-10-CM | POA: Diagnosis present

## 2024-02-27 DIAGNOSIS — M069 Rheumatoid arthritis, unspecified: Secondary | ICD-10-CM | POA: Diagnosis not present

## 2024-02-27 DIAGNOSIS — M810 Age-related osteoporosis without current pathological fracture: Secondary | ICD-10-CM | POA: Diagnosis not present

## 2024-02-27 DIAGNOSIS — K449 Diaphragmatic hernia without obstruction or gangrene: Secondary | ICD-10-CM | POA: Diagnosis not present

## 2024-02-27 DIAGNOSIS — Z6839 Body mass index (BMI) 39.0-39.9, adult: Secondary | ICD-10-CM | POA: Diagnosis not present

## 2024-02-27 DIAGNOSIS — K219 Gastro-esophageal reflux disease without esophagitis: Secondary | ICD-10-CM | POA: Diagnosis not present

## 2024-02-27 DIAGNOSIS — L4050 Arthropathic psoriasis, unspecified: Secondary | ICD-10-CM | POA: Diagnosis not present

## 2024-02-27 DIAGNOSIS — Z9889 Other specified postprocedural states: Secondary | ICD-10-CM | POA: Diagnosis present

## 2024-02-27 DIAGNOSIS — E66813 Obesity, class 3: Secondary | ICD-10-CM | POA: Diagnosis not present

## 2024-02-27 DIAGNOSIS — I252 Old myocardial infarction: Secondary | ICD-10-CM | POA: Diagnosis not present

## 2024-02-27 DIAGNOSIS — E1122 Type 2 diabetes mellitus with diabetic chronic kidney disease: Secondary | ICD-10-CM | POA: Diagnosis not present

## 2024-02-27 DIAGNOSIS — M75121 Complete rotator cuff tear or rupture of right shoulder, not specified as traumatic: Secondary | ICD-10-CM | POA: Diagnosis present

## 2024-02-27 DIAGNOSIS — Z79899 Other long term (current) drug therapy: Secondary | ICD-10-CM | POA: Diagnosis not present

## 2024-02-27 DIAGNOSIS — E1151 Type 2 diabetes mellitus with diabetic peripheral angiopathy without gangrene: Secondary | ICD-10-CM | POA: Diagnosis not present

## 2024-02-27 DIAGNOSIS — N183 Chronic kidney disease, stage 3 unspecified: Secondary | ICD-10-CM | POA: Diagnosis not present

## 2024-02-27 DIAGNOSIS — E039 Hypothyroidism, unspecified: Secondary | ICD-10-CM | POA: Diagnosis not present

## 2024-02-27 DIAGNOSIS — I2510 Atherosclerotic heart disease of native coronary artery without angina pectoris: Secondary | ICD-10-CM | POA: Diagnosis not present

## 2024-02-27 DIAGNOSIS — G894 Chronic pain syndrome: Secondary | ICD-10-CM | POA: Diagnosis not present

## 2024-02-27 DIAGNOSIS — R0602 Shortness of breath: Secondary | ICD-10-CM | POA: Diagnosis not present

## 2024-02-27 DIAGNOSIS — J439 Emphysema, unspecified: Secondary | ICD-10-CM | POA: Diagnosis not present

## 2024-02-27 DIAGNOSIS — E119 Type 2 diabetes mellitus without complications: Secondary | ICD-10-CM

## 2024-02-27 DIAGNOSIS — D631 Anemia in chronic kidney disease: Secondary | ICD-10-CM | POA: Diagnosis not present

## 2024-02-27 DIAGNOSIS — I5022 Chronic systolic (congestive) heart failure: Secondary | ICD-10-CM | POA: Diagnosis not present

## 2024-02-27 DIAGNOSIS — G4733 Obstructive sleep apnea (adult) (pediatric): Secondary | ICD-10-CM | POA: Diagnosis not present

## 2024-02-27 HISTORY — PX: REVERSE SHOULDER ARTHROPLASTY: SHX5054

## 2024-02-27 SURGERY — ARTHROPLASTY, SHOULDER, TOTAL, REVERSE
Anesthesia: Regional | Site: Shoulder | Laterality: Right

## 2024-02-27 MED ORDER — ACETAMINOPHEN 325 MG PO TABS: 325.0000 mg | ORAL_TABLET | Freq: Four times a day (QID) | ORAL | Status: DC | PRN

## 2024-02-27 MED ORDER — EZETIMIBE 10 MG PO TABS: 10.0000 mg | ORAL_TABLET | Freq: Every evening | ORAL | Status: AC

## 2024-02-27 MED ORDER — SODIUM CHLORIDE (PF) 0.9 % IJ SOLN
INTRAMUSCULAR | Status: AC
  Filled 2024-02-27: qty 20

## 2024-02-27 MED ORDER — PROPOFOL 10 MG/ML IV BOLUS
INTRAVENOUS | Status: DC | PRN
  Administered 2024-02-27: 08:00:00 100 mg via INTRAVENOUS
  Administered 2024-02-27: 08:00:00 100 ug/kg/min via INTRAVENOUS

## 2024-02-27 MED ORDER — SODIUM CHLORIDE 0.9 % IV SOLN: INTRAVENOUS | Status: DC

## 2024-02-27 MED ORDER — FENTANYL CITRATE (PF) 100 MCG/2ML IJ SOLN
INTRAMUSCULAR | Status: DC | PRN
  Administered 2024-02-27 (×2): 50 ug via INTRAVENOUS

## 2024-02-27 MED ORDER — FENTANYL CITRATE (PF) 100 MCG/2ML IJ SOLN: 25.0000 ug | INTRAMUSCULAR | Status: DC | PRN

## 2024-02-27 MED ORDER — CHLORHEXIDINE GLUCONATE 0.12 % MT SOLN
15.0000 mL | Freq: Once | OROMUCOSAL | Status: AC
  Administered 2024-02-27: 07:00:00 15 mL via OROMUCOSAL

## 2024-02-27 MED ORDER — SODIUM CHLORIDE 0.9 % IR SOLN
Status: DC | PRN
  Administered 2024-02-27: 10:00:00 3000 mL

## 2024-02-27 MED ORDER — METOPROLOL SUCCINATE ER 25 MG PO TB24: 12.5000 mg | ORAL_TABLET | Freq: Every morning | ORAL | Status: AC

## 2024-02-27 MED ORDER — PHENYLEPHRINE 80 MCG/ML (10ML) SYRINGE FOR IV PUSH (FOR BLOOD PRESSURE SUPPORT)
PREFILLED_SYRINGE | INTRAVENOUS | Status: AC
  Filled 2024-02-27: qty 10

## 2024-02-27 MED ORDER — BUPIVACAINE LIPOSOME 1.3 % IJ SUSP
INTRAMUSCULAR | Status: AC
  Filled 2024-02-27: qty 20

## 2024-02-27 MED ORDER — LIDOCAINE HCL (PF) 2 % IJ SOLN
INTRAMUSCULAR | Status: AC
  Filled 2024-02-27: qty 5

## 2024-02-27 MED ORDER — MIDAZOLAM HCL 2 MG/2ML IJ SOLN
INTRAMUSCULAR | Status: AC
  Filled 2024-02-27: qty 2

## 2024-02-27 MED ORDER — BUPIVACAINE-EPINEPHRINE (PF) 0.5% -1:200000 IJ SOLN
INTRAMUSCULAR | Status: DC | PRN
  Administered 2024-02-27: 09:00:00 30 mL

## 2024-02-27 MED ORDER — BUPIVACAINE HCL (PF) 0.5 % IJ SOLN
INTRAMUSCULAR | Status: AC
  Filled 2024-02-27: qty 10

## 2024-02-27 MED ORDER — SUGAMMADEX SODIUM 200 MG/2ML IV SOLN
INTRAVENOUS | Status: DC | PRN
  Administered 2024-02-27: 09:00:00 200 mg via INTRAVENOUS

## 2024-02-27 MED ORDER — EPHEDRINE SULFATE-NACL 50-0.9 MG/10ML-% IV SOSY
PREFILLED_SYRINGE | INTRAVENOUS | Status: DC | PRN
  Administered 2024-02-27: 08:00:00 5 mg via INTRAVENOUS

## 2024-02-27 MED ORDER — CHLORHEXIDINE GLUCONATE 0.12 % MT SOLN
OROMUCOSAL | Status: AC
  Filled 2024-02-27: qty 15

## 2024-02-27 MED ORDER — EPHEDRINE 5 MG/ML INJ
INTRAVENOUS | Status: AC
  Filled 2024-02-27: qty 5

## 2024-02-27 MED ORDER — OXYCODONE HCL 5 MG PO TABS: 5.0000 mg | ORAL_TABLET | ORAL | Status: DC | PRN

## 2024-02-27 MED ORDER — BUPIVACAINE LIPOSOME 1.3 % IJ SUSP
INTRAMUSCULAR | Status: DC | PRN
  Administered 2024-02-27: 07:00:00 20 mL via PERINEURAL

## 2024-02-27 MED ORDER — LIDOCAINE HCL (CARDIAC) PF 100 MG/5ML IV SOSY
PREFILLED_SYRINGE | INTRAVENOUS | Status: DC | PRN
  Administered 2024-02-27: 08:00:00 100 mg via INTRAVENOUS

## 2024-02-27 MED ORDER — SPIRONOLACTONE 25 MG PO TABS: 12.5000 mg | ORAL_TABLET | Freq: Every evening | ORAL | Status: AC

## 2024-02-27 MED ORDER — CEFAZOLIN SODIUM-DEXTROSE 2-4 GM/100ML-% IV SOLN
INTRAVENOUS | Status: AC
  Filled 2024-02-27: qty 100

## 2024-02-27 MED ORDER — PROPOFOL 1000 MG/100ML IV EMUL
INTRAVENOUS | Status: AC
  Filled 2024-02-27: qty 100

## 2024-02-27 MED ORDER — OXYCODONE HCL 5 MG PO TABS: 5.0000 mg | ORAL_TABLET | ORAL | 0 refills | Status: AC | PRN

## 2024-02-27 MED ORDER — ORAL CARE MOUTH RINSE: 15.0000 mL | Freq: Once | OROMUCOSAL | Status: AC

## 2024-02-27 MED ORDER — ROCURONIUM BROMIDE 10 MG/ML (PF) SYRINGE
PREFILLED_SYRINGE | INTRAVENOUS | Status: AC
  Filled 2024-02-27: qty 10

## 2024-02-27 MED ORDER — METOCLOPRAMIDE HCL 10 MG PO TABS: 5.0000 mg | ORAL_TABLET | Freq: Three times a day (TID) | ORAL | Status: DC | PRN

## 2024-02-27 MED ORDER — TRANEXAMIC ACID-NACL 1000-0.7 MG/100ML-% IV SOLN
INTRAVENOUS | Status: AC
  Filled 2024-02-27: qty 100

## 2024-02-27 MED ORDER — ONDANSETRON HCL 4 MG PO TABS: 4.0000 mg | ORAL_TABLET | Freq: Four times a day (QID) | ORAL | Status: DC | PRN

## 2024-02-27 MED ORDER — ONDANSETRON HCL 4 MG/2ML IJ SOLN
INTRAMUSCULAR | Status: AC
  Filled 2024-02-27: qty 2

## 2024-02-27 MED ORDER — MIDAZOLAM HCL (PF) 2 MG/2ML IJ SOLN
1.0000 mg | INTRAMUSCULAR | Status: DC | PRN
  Administered 2024-02-27: 07:00:00 1 mg via INTRAVENOUS

## 2024-02-27 MED ORDER — LIDOCAINE HCL (PF) 1 % IJ SOLN
INTRAMUSCULAR | Status: DC | PRN
  Administered 2024-02-27: 07:00:00 2 mL via SUBCUTANEOUS

## 2024-02-27 MED ORDER — SODIUM CHLORIDE 0.9 % IR SOLN
Status: DC | PRN
  Administered 2024-02-27: 09:00:00 500 mL

## 2024-02-27 MED ORDER — ONDANSETRON HCL 4 MG/2ML IJ SOLN
INTRAMUSCULAR | Status: DC | PRN
  Administered 2024-02-27: 08:00:00 4 mg via INTRAVENOUS

## 2024-02-27 MED ORDER — OXYCODONE HCL 5 MG/5ML PO SOLN: 5.0000 mg | Freq: Once | ORAL | Status: DC | PRN

## 2024-02-27 MED ORDER — BUPIVACAINE HCL (PF) 0.5 % IJ SOLN
INTRAMUSCULAR | Status: DC | PRN
  Administered 2024-02-27: 07:00:00 10 mL via PERINEURAL

## 2024-02-27 MED ORDER — FENTANYL CITRATE (PF) 100 MCG/2ML IJ SOLN
INTRAMUSCULAR | Status: AC
  Filled 2024-02-27: qty 2

## 2024-02-27 MED ORDER — LIDOCAINE HCL (PF) 1 % IJ SOLN
INTRAMUSCULAR | Status: AC
  Filled 2024-02-27: qty 5

## 2024-02-27 MED ORDER — DEXAMETHASONE SOD PHOSPHATE PF 10 MG/ML IJ SOLN
INTRAMUSCULAR | Status: DC | PRN
  Administered 2024-02-27: 08:00:00 10 mg via INTRAVENOUS

## 2024-02-27 MED ORDER — BUPIVACAINE-EPINEPHRINE (PF) 0.5% -1:200000 IJ SOLN
INTRAMUSCULAR | Status: AC
  Filled 2024-02-27: qty 30

## 2024-02-27 MED ORDER — ISOSORBIDE MONONITRATE ER 60 MG PO TB24: 60.0000 mg | ORAL_TABLET | Freq: Every morning | ORAL | Status: AC

## 2024-02-27 MED ORDER — OXYCODONE HCL 5 MG PO TABS: 5.0000 mg | ORAL_TABLET | Freq: Once | ORAL | Status: DC | PRN

## 2024-02-27 MED ORDER — ROCURONIUM BROMIDE 100 MG/10ML IV SOLN
INTRAVENOUS | Status: DC | PRN
  Administered 2024-02-27: 09:00:00 40 mg via INTRAVENOUS
  Administered 2024-02-27: 08:00:00 60 mg via INTRAVENOUS

## 2024-02-27 MED ORDER — KETOROLAC TROMETHAMINE 15 MG/ML IJ SOLN
INTRAMUSCULAR | Status: AC
  Filled 2024-02-27: qty 1

## 2024-02-27 MED ORDER — ACETAMINOPHEN 10 MG/ML IV SOLN: 1000.0000 mg | Freq: Once | INTRAVENOUS | Status: DC | PRN

## 2024-02-27 MED ORDER — CEFAZOLIN SODIUM-DEXTROSE 2-4 GM/100ML-% IV SOLN
2.0000 g | Freq: Four times a day (QID) | INTRAVENOUS | Status: DC
  Administered 2024-02-27: 13:00:00 2 g via INTRAVENOUS

## 2024-02-27 MED ORDER — TRANEXAMIC ACID-NACL 1000-0.7 MG/100ML-% IV SOLN
1000.0000 mg | INTRAVENOUS | Status: AC
  Administered 2024-02-27: 08:00:00 1000 mg via INTRAVENOUS

## 2024-02-27 MED ORDER — METOCLOPRAMIDE HCL 5 MG/ML IJ SOLN: 5.0000 mg | Freq: Three times a day (TID) | INTRAMUSCULAR | Status: DC | PRN

## 2024-02-27 MED ORDER — PHENYLEPHRINE 80 MCG/ML (10ML) SYRINGE FOR IV PUSH (FOR BLOOD PRESSURE SUPPORT)
PREFILLED_SYRINGE | INTRAVENOUS | Status: DC | PRN
  Administered 2024-02-27: 08:00:00 160 ug via INTRAVENOUS
  Administered 2024-02-27 (×2): 80 ug via INTRAVENOUS

## 2024-02-27 MED ORDER — ACETAMINOPHEN 10 MG/ML IV SOLN
INTRAVENOUS | Status: AC
  Filled 2024-02-27: qty 100

## 2024-02-27 MED ORDER — ONDANSETRON HCL 4 MG/2ML IJ SOLN: 4.0000 mg | Freq: Four times a day (QID) | INTRAMUSCULAR | Status: DC | PRN

## 2024-02-27 MED ORDER — KETOROLAC TROMETHAMINE 15 MG/ML IJ SOLN
15.0000 mg | Freq: Once | INTRAMUSCULAR | Status: AC
  Administered 2024-02-27: 11:00:00 15 mg via INTRAVENOUS

## 2024-02-27 MED ORDER — CEFAZOLIN SODIUM-DEXTROSE 2-4 GM/100ML-% IV SOLN
2.0000 g | INTRAVENOUS | Status: AC
  Administered 2024-02-27: 08:00:00 2 g via INTRAVENOUS

## 2024-02-27 MED ORDER — DROPERIDOL 2.5 MG/ML IJ SOLN: 0.6250 mg | Freq: Once | INTRAMUSCULAR | Status: DC | PRN

## 2024-02-27 SURGICAL SUPPLY — 60 items
BASEPLATE BOSS DRILL (MISCELLANEOUS) IMPLANT
BASEPLATE GLENOSPHERE 25 (Plate) IMPLANT
BIT DRILL F/BASEPLATE CENTRAL (BIT) IMPLANT
BIT DRILL TWIST 2.7 (BIT) IMPLANT
BLADE SAW SAG 25X90X1.19 (BLADE) ×1 IMPLANT
CHLORAPREP W/TINT 26 (MISCELLANEOUS) ×1 IMPLANT
COMP HUM UNI IDENTI 36 +0 (Joint) IMPLANT
COOLER ICEMAN CLASSIC (MISCELLANEOUS) ×1 IMPLANT
DRAPE INCISE IOBAN 66X45 STRL (DRAPES) ×1 IMPLANT
DRAPE SHEET LG 3/4 BI-LAMINATE (DRAPES) ×1 IMPLANT
DRAPE TABLE BACK 80X90 (DRAPES) ×1 IMPLANT
DRSG OPSITE POSTOP 4X8 (GAUZE/BANDAGES/DRESSINGS) ×1 IMPLANT
ELECT CAUTERY BLADE 6.4 (BLADE) ×1 IMPLANT
ELECTRODE BLDE 4.0 EZ CLN MEGD (MISCELLANEOUS) ×1 IMPLANT
ELECTRODE REM PT RTRN 9FT ADLT (ELECTROSURGICAL) ×1 IMPLANT
GAUZE XEROFORM 1X8 LF (GAUZE/BANDAGES/DRESSINGS) ×1 IMPLANT
GLENOID SPHERE 36MM CVD +3 (Orthopedic Implant) IMPLANT
GLOVE BIO SURGEON STRL SZ7.5 (GLOVE) ×4 IMPLANT
GLOVE BIO SURGEON STRL SZ8 (GLOVE) ×4 IMPLANT
GLOVE BIOGEL PI IND STRL 8 (GLOVE) ×3 IMPLANT
GOWN STRL REUS W/ TWL LRG LVL3 (GOWN DISPOSABLE) ×2 IMPLANT
GOWN STRL REUS W/ TWL XL LVL3 (GOWN DISPOSABLE) ×1 IMPLANT
GUIDE PIN 2.0 X 150 (WIRE) IMPLANT
HANDLE YANKAUER SUCT OPEN TIP (MISCELLANEOUS) ×1 IMPLANT
HOOD PEEL AWAY T7 (MISCELLANEOUS) ×3 IMPLANT
KIT STABILIZATION SHOULDER (MISCELLANEOUS) ×1 IMPLANT
KIT TURNOVER KIT A (KITS) ×1 IMPLANT
MANIFOLD NEPTUNE II (INSTRUMENTS) ×1 IMPLANT
MASK FACE SPIDER DISP (MASK) ×1 IMPLANT
MAT ABSORB FLUID 56X50 GRAY (MISCELLANEOUS) ×1 IMPLANT
NDL MAYO CATGUT SZ1 (NEEDLE) IMPLANT
NDL SAFETY ECLIP 18X1.5 (MISCELLANEOUS) ×1 IMPLANT
NDL SPNL 20GX3.5 QUINCKE YW (NEEDLE) ×1 IMPLANT
PACK ARTHROSCOPY SHOULDER (MISCELLANEOUS) ×1 IMPLANT
PAD ARMBOARD POSITIONER FOAM (MISCELLANEOUS) ×1 IMPLANT
PAD COLD SHLDR SM WRAP-ON (PAD) ×1 IMPLANT
PAD COLD UNI XL WRAP-ON (PAD) IMPLANT
PENCIL SMOKE EVACUATOR (MISCELLANEOUS) ×1 IMPLANT
PIN THREADED REVERSE (PIN) IMPLANT
SCREW BONE LOCKING 4.75X40X3.5 (Screw) IMPLANT
SCREW BONE STRL 6.5MMX30MM (Screw) IMPLANT
SCREW LOCKING 4.75MMX15MM (Screw) IMPLANT
SCREW LOCKING STRL 4.75X25X3.5 (Screw) IMPLANT
SLING ULTRA II LG (MISCELLANEOUS) IMPLANT
SLING ULTRA II M (MISCELLANEOUS) IMPLANT
SOL .9 NS 3000ML IRR UROMATIC (IV SOLUTION) ×1 IMPLANT
SOLN STERILE WATER 500 ML (IV SOLUTION) ×1 IMPLANT
SPONGE T-LAP 18X18 ~~LOC~~+RFID (SPONGE) ×2 IMPLANT
STAPLER SKIN PROX 35W (STAPLE) ×1 IMPLANT
STEM HUMERAL MICRO SZ10 (Stem) IMPLANT
SUT ETHIBOND 0 MO6 C/R (SUTURE) ×1 IMPLANT
SUT VIC AB 0 CT1 36 (SUTURE) ×1 IMPLANT
SUT VIC AB 2-0 CT1 TAPERPNT 27 (SUTURE) ×2 IMPLANT
SUTURE FIBERWR #2 38 BLUE 1/2 (SUTURE) ×3 IMPLANT
SYR 10ML LL (SYRINGE) ×1 IMPLANT
SYR 30ML LL (SYRINGE) ×1 IMPLANT
SYR TOOMEY 50ML (SYRINGE) ×1 IMPLANT
TIP FAN IRRIG PULSAVAC PLUS (DISPOSABLE) ×1 IMPLANT
TRAP FLUID SMOKE EVACUATOR (MISCELLANEOUS) ×1 IMPLANT
TRAY HUMERAL NEUTRAL EXT 6 (Shoulder) IMPLANT

## 2024-02-27 NOTE — Op Note (Addendum)
 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  Post-Op Diagnosis:   Same  Procedure:   Reverse right total shoulder arthroplasty.  Surgeon:   DOROTHA Reyes Maltos, MD  Assistant:   Gustavo Level, PA-C  Anesthesia:   General endotracheal with an interscalene block using Exparel  placed preoperatively by the anesthesiologist.  Findings:   As above.  Complications:   None  EBL:   50 cc  Fluids:   600 cc crystalloid  UOP:   None  TT:   None  Drains:   None  Closure:   Staples  Implants:   All press-fit [employer] Comprehensive system with a #10 Identity micro-humeral stem, a -6 mm extended neutral Identity humeral tray with a +0 mm insert, and a mini-base plate with a 36 mm +3 mm lateralized glenosphere.  Brief Clinical Note:   The patient is a 68 year old female with a history of progressively worsening pain and weakness of the right shoulder now 1.5 years status post an arthroscopic rotator cuff repair with subacromial decompression and distal clavicle excision. The patient's symptoms have progressed despite medications, activity modification, etc. The patient's history and examination are consistent with a recurrent massive irreparable rotator cuff tear with early cuff arthropathy, all of which were confirmed by MRI scan preoperatively. The patient presents at this time for a reverse right total shoulder arthroplasty.  Procedure:   The patient underwent placement of an interscalene block using Exparel  by the anesthesiologist in the preoperative holding area before being brought into the operating room and lain in the supine position. The patient then underwent general endotracheal intubation and anesthesia before the patient was repositioned in the beach chair position using the beach chair positioner. The right shoulder and upper extremity were prepped with  ChloraPrep solution before being draped sterilely. Preoperative antibiotics were administered. A timeout was performed to verify the appropriate surgical site.    A standard anterior approach to the shoulder was made through an approximately 4-5 inch incision. The incision was carried down through the subcutaneous tissues to expose the deltopectoral fascia. The interval between the deltoid and pectoralis muscles was identified and this plane developed, retracting the cephalic vein laterally with the deltoid muscle. The conjoined tendon was identified. Its lateral margin was dissected and the Kolbel self-retraining retractor inserted. The three sisters were identified and cauterized. Bursal tissues were removed to improve visualization.   The biceps tendon was identified near the inferior aspect of the bicipital groove. A soft tissue tenodesis was performed by attaching the biceps tendon to the adjacent pectoralis major tendon using two #0 Ethibond interrupted sutures. The biceps tendon was then transected just proximal to the tenodesis site. The subscapularis tendon was released from its attachment to the lesser tuberosity 1 cm proximal to its insertion and several tagging sutures placed. The inferior capsule was released with care after identifying and protecting the axillary nerve. The proximal humeral cut was made at approximately 25 of retroversion using the extra-medullary guide.   Attention was redirected to the glenoid. The labrum was debrided circumferentially before the center of the glenoid was marked with electrocautery. The guidewire was drilled into the glenoid vault using the appropriate guide. After verifying its position, it was overreamed with the mini-baseplate reamer to create a flat surface. The permanent mini-baseplate was impacted into place. It was stabilized with a 35 x 6.5 mm central screw and four peripheral  locking screws. The permanent 36 mm +3 mm lateralized glenosphere was then  impacted into place and its Morse taper locking mechanism verified using manual distraction.  Attention was directed to the humeral side. The humeral canal was reamed sequentially beginning with the end-cutting reamer then progressing from a 4 mm reamer up to a 10 mm reamer. This provided excellent circumferential chatter. The canal was broached beginning with a # 8 broach and progressing to a # 10 broach.  The plastic stem was inserted into the end of the broach and the proximal reaming performed. A trial reduction was performed using the -6 mm extended neutral humeral platform with the +0 mm insert. With the +0 mm insert, the arm demonstrated excellent range of motion as the hand could be brought across the chest to the opposite shoulder and brought to the top of the patient's head and to the patient's ear. The shoulder appeared stable throughout this range of motion. The joint was dislocated and the trial components removed.   The permanent #10 Identity micro-stem was connected with the -6 mm extended neutral humeral platform on the back table before this construct was impacted into place with care taken to maintain the appropriate version. The +0 mm insert was snapped into place. The shoulder was relocated using two finger pressure and again placed through a range of motion with the findings as described above.  The wound was copiously irrigated with sterile saline solution using the jet lavage system before a total of 30 cc of 0.5% Sensorcaine  with epinephrine  was injected into the pericapsular and peri-incisional tissues to help with postoperative analgesia. The subscapularis tendon was reapproximated using #2 FiberWire interrupted sutures. The deltopectoral interval was closed using #0 Vicryl interrupted sutures before the subcutaneous tissues were closed using 2-0 Vicryl interrupted sutures. The skin was closed using staples. A sterile occlusive dressing was applied to the wound before the arm was  placed into a shoulder immobilizer with an abduction pillow. A Polar Care system also was applied to the shoulder. The patient was then transferred back to a hospital bed before being awakened, extubated, and returned to the recovery room in satisfactory condition after tolerating the procedure well.

## 2024-02-27 NOTE — Anesthesia Procedure Notes (Signed)
 Anesthesia Regional Block: Interscalene brachial plexus block   Pre-Anesthetic Checklist: , timeout performed,  Correct Patient, Correct Site, Correct Laterality,  Correct Procedure, Correct Position, site marked,  Risks and benefits discussed,  Surgical consent,  Pre-op evaluation,  At surgeon's request and post-op pain management  Laterality: Upper and Right  Prep: chloraprep       Needles:  Injection technique: Single-shot  Needle Type: Echogenic Stimulator Needle     Needle Length: 10cm  Needle Gauge: 20     Additional Needles:   Procedures:,,,, ultrasound used (permanent image in chart),,    Narrative:  End time: 02/27/2024 7:26 AM  Performed by: Personally  Anesthesiologist: Myra Lynwood MATSU, MD  Additional Notes: Pt. Identified and accepting of procedure after risks and benefits fully reviewed and questions answered. Time out performed and laterality confirmed prior to procedure.  ISNB  performed without difficulty and well tolerated.  Neg IV and SATD.  No pain on injection of Local anesthetic and VSST.

## 2024-02-27 NOTE — Discharge Instructions (Addendum)
 Orthopedic discharge instructions: May shower with intact OpSite dressing once nerve block has worn off (Saturday).  Apply ice frequently to shoulder or use Polar Care device. Take ibuprofen 600 mg TID with meals for 3-5 days, then as necessary. Take oxycodone  as prescribed when needed.  May supplement with ES Tylenol  if necessary. May resume daily baby aspirin  tomorrow morning. Keep shoulder immobilizer on at all times except may remove for bathing purposes. Follow-up in 10-14 days or as scheduled.     Interscalene Nerve Block with Exparel    For your surgery you have received an Interscalene Nerve Block with Exparel . Nerve Blocks affect many types of nerves, including nerves that control movement, pain and normal sensation.  You may experience feelings such as numbness, tingling, heaviness, weakness or the inability to move your arm or the feeling or sensation that your arm has fallen asleep. A nerve block with Exparel  can last up to 5 days.  Usually the weakness wears off first.  The tingling and heaviness usually wear off next.  Finally you may start to notice pain.  Keep in mind that this may occur in any order.  Once a nerve block starts to wear off it is usually completely gone within 60 minutes. ISNB may cause mild shortness of breath, a hoarse voice, blurry vision, unequal pupils, or drooping of the face on the same side as the nerve block.  These symptoms will usually resolve with the numbness.  Very rarely the procedure itself can cause mild seizures. If needed, your surgeon will give you a prescription for pain medication.  It will take about 60 minutes for the oral pain medication to become fully effective.  So, it is recommended that you start taking this medication before the nerve block first begins to wear off, or when you first begin to feel discomfort. Take your pain medication only as prescribed.  Pain medication can cause sedation and decrease your breathing if you take more  than you need for the level of pain that you have. Nausea is a common side effect of many pain medications.  You may want to eat something before taking your pain medicine to prevent nausea. After an Interscalene nerve block, you cannot feel pain, pressure or extremes in temperature in the effected arm.  Because your arm is numb it is at an increased risk for injury.  To decrease the possibility of injury, please practice the following:  While you are awake change the position of your arm frequently to prevent too much pressure on any one area for prolonged periods of time.  If you have a cast or tight dressing, check the color or your fingers every couple of hours.  Call your surgeon with the appearance of any discoloration (white or blue). If you are given a sling to wear before you go home, please wear it  at all times until the block has completely worn off.  Do not get up at night without your sling. Please contact [facility] or your surgeon if you do not begin to regain sensation after 7 days from the surgery.  Anesthesia may be contacted by calling the Same Day Surgery Department, Mon. through Fri., 6 am to 4 pm at [phone].   If you experience any other problems or concerns, please contact your surgeon's office. If you experience severe or prolonged shortness of breath go to the nearest emergency department.   Toradol  was given at 10:30 am in the OR same as NSAID. Can take Ibuprofen after  6:30 pm.  SHOULDER SLING IMMOBILIZER   VIDEO Slingshot 2 Shoulder Brace Application - YouTube ---[email or web address].[email or web address]  INSTRUCTIONS While supporting the injured arm, slide the forearm into the sling. Wrap the adjustable shoulder strap around the neck and shoulders and attach the strap end to the sling using  the alligator strap tab.  Adjust the shoulder strap to the required length. Position the shoulder pad behind the neck. To secure the shoulder pad location  (optional), pull the shoulder strap away from the shoulder pad, unfold the hook material on the top of the pad, then press the shoulder strap back onto the hook material to secure the pad in place. Attach the closure strap across the open top of the sling. Position the strap so that it holds the arm securely in the sling. Next, attach the thumb strap to the open end of the sling between the thumb and fingers. After sling has been fit, it may be easily removed and reapplied using the quick release buckle on shoulder strap. If a neutral pillow or 15 abduction pillow is included, place the pillow at the waistline. Attach the sling to the pillow, lining up hook material on the pillow with the loop on sling. Adjust the waist strap to fit.  If waist strap is too long, cut it to fit. Use the small piece of double sided hook material (located on top of the pillow) to secure the strap end. Place the double sided hook material on the inside of the cut strap end and secure it to the waist strap.     If no pillow is included, attach the waist strap to the sling and adjust to fit.    Washing Instructions: Straps and sling must be removed and cleaned regularly depending on your activity level and perspiration. Hand wash straps and sling in cold water with mild detergent, rinse, air dry

## 2024-02-27 NOTE — Anesthesia Procedure Notes (Addendum)
 Procedure Name: Intubation Date/Time: 02/27/2024 7:52 AM  Performed by: Bonnetta Jimmey SAUNDERS, CRNAPre-anesthesia Checklist: Patient identified, Emergency Drugs available, Suction available and Patient being monitored Patient Re-evaluated:Patient Re-evaluated prior to induction Oxygen Delivery Method: Circle system utilized Preoxygenation: Pre-oxygenation with 100% oxygen Induction Type: IV induction Ventilation: Mask ventilation without difficulty Laryngoscope Size: McGrath and 3 Grade View: Grade I Tube type: Oral Number of attempts: 1 Airway Equipment and Method: Stylet and Oral airway Placement Confirmation: ETT inserted through vocal cords under direct vision, positive ETCO2 and breath sounds checked- equal and bilateral Secured at: 20 cm Tube secured with: Tape Dental Injury: Teeth and Oropharynx as per pre-operative assessment

## 2024-02-27 NOTE — Anesthesia Postprocedure Evaluation (Signed)
 Anesthesia Post Note  Patient: Jacqueline Rhodes  Procedure(s) Performed: ARTHROPLASTY, SHOULDER, TOTAL, REVERSE (Right: Shoulder)  Patient location during evaluation: PACU Anesthesia Type: Regional Level of consciousness: awake and alert Pain management: pain level controlled Vital Signs Assessment: post-procedure vital signs reviewed and stable Respiratory status: spontaneous breathing, nonlabored ventilation, respiratory function stable and patient connected to nasal cannula oxygen Cardiovascular status: blood pressure returned to baseline and stable Postop Assessment: no apparent nausea or vomiting Anesthetic complications: no   There were no known notable events for this encounter.   Last Vitals:  Vitals:   02/27/24 1136 02/27/24 1443  BP: (!) 107/48 135/72  Pulse: (!) 54 (!) 58  Resp: 16   Temp: (!) 36.2 C (!) 36.1 C  SpO2: 93% 95%    Last Pain:  Vitals:   02/27/24 1443  TempSrc: Temporal  PainSc: 0-No pain                 Lynwood KANDICE Clause

## 2024-02-27 NOTE — H&P (Signed)
 History of Present Illness: Jacqueline Rhodes is a 68 y.o. female who presents today for her surgical history and physical for upcoming right reverse total shoulder arthroplasty. Surgery scheduled with Dr. Edie on 02/27/2024. The patient denies any changes in her medical history since she was last evaluated. The patient reports a 7 out of 10 pain score in the right shoulder. She is not taking any pain medication consistently at this time. She does have a history of STEMI and cardiac catheterization. She is no longer on Brilinta , she does take an aspirin  routinely. She does have a history of COPD and is followed by pulmonology. The patient denies any history of DVT. She does have a history of borderline diabetes, most recent A1c was 6.4.  Past Medical History: Acid reflux  Allergic rhinitis due to allergen  Allergic state  Asthma, unspecified asthma severity, unspecified whether complicated, unspecified whether persistent (HHS-HCC)  Bronchitis, chronic (CMS/HHS-HCC)  CAD (60% mid-LAD. Subendocardial MI, 12/2004)  Cancer (CMS/HHS-HCC)  Carpal tunnel syndrome  Cataract cortical, senile  Cervicalgia  Complete rupture of rotator cuff  COPD (chronic obstructive pulmonary disease) (CMS/HHS-HCC)  Coronary atherosclerosis of native coronary artery  COVID-19 11/07/2019  Dermatitis  Diverticulosis  Essential hypertension, benign  GERD (gastroesophageal reflux disease)  History of thyroid  cancer  s/p left hemithyroidectomy 08/2015 (follows with Dr Eleanora)  Hyperlipidemia 09/18/2013  Hypertension 09/18/2013  Lesion of ulnar nerve  Myocardial infarction (CMS/HHS-HCC)  Neuralgia, neuritis, and radiculitis, unspecified  Obesity  Osteoarthritis  left rotator cuff tear, left carpal tunnel, left ulnar neuropathy, cervical spine disc bulge,feet, knees.  Osteoarthrosis involving, or with mention of more than one site, but not specified as generalized, multiple sites  Osteoporosis, post-menopausal   Other and unspecified angina pectoris ()  Other and unspecified hyperlipidemia  Pneumonia 03/30/2015  Psoriasis ([facility]) 09/18/2013  a. Psoriatic arthritis. b. S/p Sulfasalazine. c. Methotrexate. d. Enbrel.  Psoriatic arthropathy (CMS/HHS-HCC)  Pulmonary emphysema (CMS/HHS-HCC) 09/20/2023  Renal insufficiency  worsened with Motrin  Rheumatoid arthritis(714.0) (CMS/HHS-HCC)  Sinusitis, unspecified  Sleep apnea  STEMI (ST elevation myocardial infarction) (CMS/HHS-HCC)  Thyroid  disease  Type 2 diabetes  diet-controlled  Venous disease   Past Surgical History: HYSTERECTOMY 2003 (with bilateral oophorectomy)  COLONOSCOPY 10/17/2006 (repeat 10 years, Dr. Reeta)  CARDIAC CATHETERIZATION 2009  TOOTH EXTRACTION 05/2015  LOBECTOMY TOTAL THYROID  Left 09/07/2015  Procedure: LOBECTOMY TOTAL THYROID ; Surgeon: Sheena Deward Breeze, MD; Location: [facility] OR; Service: General Surgery; Laterality: Left; [facility] bed in OR, case to follow Dr Victory cases.  MONITORING CRANIAL NERVES UNILATERAL Left 09/07/2015  Procedure: MONITORING CRANIAL NERVES UNILATERAL w/ NIMS nerve monitor; Surgeon: Sheena Deward Breeze, MD; Location: [facility] OR; Service: General Surgery; Laterality: Left; NIMS nerve monitor  PARATHYROID AUTOTRANSPLANTATION N/A 09/07/2015  Procedure: PARATHYROID AUTOTRANSPLANTATION (LIST IN ADDITION TO PRIMARY PROCEDURE); Surgeon: Sheena Deward Breeze, MD; Location: [facility] OR; Service: General Surgery; Laterality: N/A;  hemithyroidectomy 09/07/2015  Right arthroscopic rotator cuff repair, biceps tendoesis, subacromial decompression, extensive debridement of shoulder (glenohumeral & subacromial spaces) distal clavicle excision Right 08/02/2022 (Dr. Tobie)  Cardiac Catheterization 12/20/2022  BREAST SURGERY  CATARACT EXTRACTION  EYE SURGERY  Left ankle fracture repair, reconstructive surgery.  Left shoulder arthroscopic surgery.  Right elbow tendon surgery.  Status post breast biopsy.    Past Family History: Coronary Artery Disease (Blocked arteries around heart) Mother  Diabetes type II Mother  High blood pressure (Hypertension) Mother  Arthritis Mother  Allergies Mother  Osteoarthritis Mother  Osteoporosis (Thinning of bones) Mother  Thyroid  disease Mother  Myocardial  Infarction (Heart attack) Father  Arthritis Father  High blood pressure (Hypertension) Father  Coronary Artery Disease (Blocked arteries around heart) Father  Schizophrenia Brother  Arthritis Brother  Myocardial Infarction (Heart attack) Brother  Coronary Artery Disease (Blocked arteries around heart) Brother  Cancer Maternal Grandfather  No Known Problems Maternal Grandmother  Stroke Paternal Grandmother  Cancer Paternal Grandfather  Thyroid  disease Sister  Goiter Sister  Arthritis Brother  No Known Problems Daughter  Allergies Sister  Bronchiolitis Sister  Cancer Sister  High blood pressure (Hypertension) Sister  Autoimmune disease Sister  Arthritis Sister  Rheum arthritis Sister  Thyroid  disease Sister  Atrial fibrillation (Abnormal heart rhythm sometimes requiring treatment with blood thinners) Sister  Intolerance to statin medications Sister  Allergies Sister  Thyroid  disease Sister  Arthritis Sister  Rheum arthritis Sister  Osteoporosis (Thinning of bones) Sister  Thyroid  disease Sister  Allergies Sister  Arthritis Sister  Rheum arthritis Sister  High blood pressure (Hypertension) Brother  Arthritis Brother  Rheum arthritis Brother  Cancer Paternal Uncle  Cancer Paternal Uncle  Anesthesia problems Neg Hx   Medications: albuterol  MDI, PROVENTIL , VENTOLIN , PROAIR , HFA 90 mcg/actuation inhaler Inhale 2 inhalations into the lungs every 6 (six) hours as needed for Wheezing 8.5 g 0  aspirin  81 MG EC tablet Take 81 mg by mouth once daily.  bempedoic acid (NEXETOL) 180 mg tablet Take 1 tablet (180 mg total) by mouth once daily (Patient not taking: Reported on 02/14/2024) 90  tablet 3  blood glucose diagnostic (ONETOUCH ULTRA TEST) test strip 1 each (1 strip total) 3 (three) times daily 100 strip 2  blood glucose diagnostic test strip 1 each (1 strip total) 3 (three) times daily For freestyle meter Use as instructed. 100 each 12  blood glucose meter kit as directed 1 each 1  cetirizine (ZYRTEC) 10 MG tablet Take 10 mg by mouth once daily  coenzyme Q10 10 mg capsule Take 10 mg by mouth once daily  EPINEPHrine  (EPIPEN  JR) 0.15 mg/0.3 mL auto-injector Inject 0.15 mg into the muscle  ezetimibe  (ZETIA ) 10 mg tablet Take 1 tablet (10 mg total) by mouth once daily 90 tablet 3  famotidine  (PEPCID ) 20 MG tablet 2 (two) times daily  fluticasone  furoate-vilanteroL (BREO ELLIPTA ) 100-25 mcg/dose DsDv inhaler Inhale 1 Puff into the lungs once daily 60 each 6  folic acid  (FOLVITE ) 1 MG tablet Take 1 tablet (1,000 mcg total) by mouth once daily 90 tablet 3  FUROsemide  (LASIX ) 20 MG tablet Take 2 tablets (40 mg total) by mouth every morning 180 tablet 1  isosorbide  mononitrate (IMDUR ) 60 MG ER tablet Take 1 tablet (60 mg total) by mouth once daily 90 tablet 2  levothyroxine  (SYNTHROID ) 112 MCG tablet Take 1 tablet (112 mcg total) by mouth once daily Take on an empty stomach with a glass of water at least 30-60 minutes before breakfast. (Patient not taking: Reported on 02/14/2024) 90 tablet 4  levothyroxine  (SYNTHROID ) 125 MCG tablet Take 1 tablet (125 mcg total) by mouth once daily Take on an empty stomach with a glass of water at least 30-60 minutes before breakfast. 90 tablet 4  methotrexate (RHEUMATREX) 2.5 MG tablet Take 8 tablets (20 mg total) by mouth every 7 (seven) days 96 tablet 1  metoprolol  SUCCinate (TOPROL -XL) 25 MG XL tablet Take 0.5 tablets (12.5 mg total) by mouth every morning 45 tablet 3  mupirocin (BACTROBAN) 2 % ointment Apply to affected area as directed. 30 g 3  nitroGLYcerin  (NITROSTAT ) 0.4 MG SL tablet Place 0.4  mg under the tongue every 5 (five) minutes as  needed for Chest pain May take up to 3 doses.  pantoprazole  (PROTONIX ) 40 MG DR tablet Take 1 tablet (40 mg total) by mouth 2 (two) times daily before meals 180 tablet 2  potassium chloride  (KLOR-CON  M10) 10 mEq ER tablet Take 1 tablet (10 mEq total) by mouth once daily 90 tablet 1  RED YEAST RICE ORAL Take by mouth  spironolactone  (ALDACTONE ) 25 MG tablet Take 0.5 tablets (12.5 mg total) by mouth once daily Take 0.5(12.5mg ) tablet by mouth one time daily 45 tablet 3  vit D3-vit K2-ca fructoborate 20 mcg-180 mcg- 216 mg Tab Take 1 tablet by mouth once daily   Allergies: Codeine Sulfate Headache  Severe HAs  Estrogens Blood Disorder (Blood clot)  Losartan  Swelling, Tongue swelling  Statins-Hmg-Coa Reductase Inhibitors Anaphylaxis and Swelling  ALL STATIN MEDICATIONS  Banana Unknown  Cosentyx  [Secukinumab ] Angioedema  Egg Unknown  Grape Other (See Comments)  Melon Unknown  Onion Unknown  Penicillins Hives  Has patient had a PCN reaction causing immediate rash, facial/tongue/throat swelling, SOB or lightheadedness with hypotension: yes Has patient had a PCN reaction causing severe rash involving mucus membranes or skin necrosis: no Has patient had a PCN reaction that required hospitalization: no Has patient had a PCN reaction occurring within the last 10 years: no If all of the above answers are NO, then may proceed with Cephalosporin use.  Tomato Other (See Comments)  Cyclobenzaprine Hives  Penicillin V Potassium Hives  Seldane [Terfenadine] Hives  Vicodin [Hydrocodone -Acetaminophen ] Hives   Review of Systems:  A comprehensive 14 point ROS was performed, reviewed by me today, and the pertinent orthopaedic findings are documented in the HPI.  Physical Exam: BP 128/70  Ht 162.6 cm (5' 4)  Wt (!) 105.6 kg (232 lb 12.8 oz)  BMI 39.96 kg/m  General/Constitutional: The patient appears to be well-nourished, well-developed, and in no acute distress. Neuro/Psych: Normal mood and  affect, oriented to person, place and time. Eyes: Non-icteric. Pupils are equal, round, and reactive to light, and exhibit synchronous movement. ENT: Unremarkable. Lymphatic: No palpable adenopathy. Respiratory: Lungs clear to auscultation, Normal chest excursion, No wheezes, and Non-labored breathing Cardiovascular: Regular rate and rhythm. No murmurs. and No edema, swelling or tenderness, except as noted in detailed exam. Integumentary: No impressive skin lesions present, except as noted in detailed exam. Musculoskeletal: Unremarkable, except as noted in detailed exam.  Right shoulder exam: SKIN: Well-healed arthroscopic portal sites, otherwise unremarkable. SWELLING: None WARMTH: None LYMPH NODES: No adenopathy palpable CREPITUS: None TENDERNESS: Mild-moderately tender over anterolateral shoulder ROM (active):  Forward flexion: 95 degrees Abduction: 80 degrees Internal rotation: Right PSIS ROM (passive):  Forward flexion: 130 degrees Abduction: 120 degrees ER/IR at 90 abd: 85 degrees/55 degrees  She describes mild-moderate pain with attempted active forward flexion and abduction, as well as at the extremes of all motions.  STRENGTH: Forward flexion: 3/5 Abduction: 3/5 External rotation: 3+-4/5 Internal rotation: 4/5 Pain with RC testing: Mild-moderate pain with resisted forward flexion and abduction  STABILITY: Normal  SPECIAL TESTS: Vonzell' test: Mild-moderately positive Speed's test: Not evaluated Capsulitis - pain w/ passive ER: No Crossed arm test: Minimally positive Crank: Not evaluated Anterior apprehension: Negative Posterior apprehension: Not evaluated  She remains neurovascularly intact to the right upper extremity.  Imaging: True AP, Y-scapular, and axillary views of the right shoulder are obtained. These films demonstrate mild degenerative changes as manifested by mild joint space narrowing and a small inferior humeral osteophyte. The  subacromial space is  mildly decreased. There is no subacromial or infra-clavicular spurring. She demonstrates a Type II acromion.  CT OF THE UPPER RIGHT EXTREMITY WITHOUT CONTRAST:  1. Moderate osteoarthritis of the glenohumeral joint.  2. Suggestion of a complete tear of the supraspinatus tendon.  3. Aortic Atherosclerosis (ICD10-I70.0).   Impression: 1. Nontraumatic complete tear of right rotator cuff. 2. Status post right rotator cuff repair. 3. Rotator cuff tendinitis, right.  Plan:  1. Treatment options were discussed today with the patient. 2. The patient is scheduled to undergo a right reverse total shoulder arthroplasty on 02/27/2024. Surgery scheduled with Dr. Edie. 3. The patient was instructed on the risk and benefits of surgery and wishes to proceed at this time. This document will serve as a surgical history and physical for the patient. 4. The patient will follow-up per standard postop protocol. They can call the clinic they have any questions, new symptoms develop or symptoms worsen.  The procedure was discussed with the patient, as were the potential risks (including bleeding, infection, nerve and/or blood vessel injury, persistent or recurrent pain, failure of the hardware, dislocation, axillary nerve injury, need for further surgery, blood clots, strokes, heart attacks and/or arhythmias, pneumonia, etc.) and benefits. The patient states her understanding and wishes to proceed.    H&P reviewed and patient re-examined. No changes.

## 2024-02-27 NOTE — Transfer of Care (Signed)
 Immediate Anesthesia Transfer of Care Note  Patient: Jacqueline Rhodes  Procedure(s) Performed: ARTHROPLASTY, SHOULDER, TOTAL, REVERSE (Right: Shoulder)  Patient Location: PACU  Anesthesia Type:General and Regional  Level of Consciousness: drowsy  Airway & Oxygen Therapy: Patient Spontanous Breathing  Post-op Assessment: Report given to RN and Post -op Vital signs reviewed and stable  Post vital signs: Reviewed and stable  Last Vitals:  Vitals Value Taken Time  BP 122/62 02/27/24 10:04  Temp    Pulse 73 02/27/24 10:07  Resp 12 02/27/24 10:07  SpO2 93 % 02/27/24 10:07  Vitals shown include unfiled device data.  Last Pain:  Vitals:   02/27/24 0627  TempSrc: Temporal  PainSc: 7          Complications: There were no known notable events for this encounter.

## 2024-02-27 NOTE — Anesthesia Preprocedure Evaluation (Signed)
 Anesthesia Evaluation  Patient identified by MRN, date of birth, ID band Patient awake    Reviewed: Allergy & Precautions, H&P , NPO status , Patient's Chart, lab work & pertinent test results, reviewed documented beta blocker date and time   Airway Mallampati: II  TM Distance: >3 FB Neck ROM: full    Dental  (+) Edentulous Upper, Edentulous Lower   Pulmonary shortness of breath and at rest, asthma , sleep apnea and Continuous Positive Airway Pressure Ventilation , pneumonia, resolved, COPD,  COPD inhaler, former smoker   Pulmonary exam normal        Cardiovascular Exercise Tolerance: Poor hypertension, On Medications + angina with exertion + CAD, + Past MI, + Peripheral Vascular Disease and + DOE  Normal cardiovascular exam Rhythm:regular Rate:Normal     Neuro/Psych  Headaches  Neuromuscular disease  negative psych ROS   GI/Hepatic Neg liver ROS, hiatal hernia,GERD  ,,  Endo/Other  diabetesHypothyroidism  Class 3 obesity  Renal/GU Renal disease  negative genitourinary   Musculoskeletal   Abdominal   Peds  Hematology  (+) Blood dyscrasia, anemia   Anesthesia Other Findings Past Medical History: No date: Anemia No date: Anginal pain No date: Angioedema     Comment:  a.) seeing allergist; felt to be secondary to losartan                vs statin No date: Aortic atherosclerosis No date: Asthma No date: B12 deficiency No date: Bilateral carotid artery disease No date: Carpal tunnel syndrome No date: Chronic heart failure with mildly reduced ejection fraction  (HFmrEF) (HCC) No date: Chronic pain syndrome No date: Chronic venous insufficiency No date: CKD (chronic kidney disease), stage III (HCC) No date: Complete tear of right rotator cuff No date: COPD (chronic obstructive pulmonary disease) (HCC) 01/07/2005: Coronary artery disease     Comment:  a.) s/p NSTEMI 03/09/2005; b.) s/p anterolateral STEMI                12/20/2022 --> 100% mLAD with 70% side branch of D2 (2.5               x 18 mm Onyx Frontier DES --> 50% residual D2 side               branch) No date: DDD (degenerative disc disease), cervical No date: Diverticulosis No date: DOE (dyspnea on exertion) No date: Dyspnea No date: GERD (gastroesophageal reflux disease) No date: Hiatal hernia 2014: History of bilateral cataract extraction No date: Hyperlipidemia No date: Hypertension No date: Hypothyroidism     Comment:  a.) s/p LEFT hemithyroidectomy; on levothyroxine  No date: Long term current use of aspirin  No date: Long term current use of immunosuppressive drug     Comment:  a.) MTX + guselkumab for RA/psoriatric arthritis               diagnoses No date: Lymphedema 01/06/2005: NSTEMI (non-ST elevated myocardial infarction) (HCC)     Comment:  a.) LHC 01/07/2005: 60% pLAD, 70% D1, 30% pLCx, 20%               pRCA, 25% dRCA -- med mgmt No date: OSA on CPAP     Comment:  a.) compliance issues with prescribed nocturnal PAP               therapy No date: Osteoarthritis No date: Peripheral vascular disease No date: Pneumonia No date: Post-menopausal osteoporosis No date: Psoriatic arthritis (HCC) No date: PSVT (paroxysmal supraventricular tachycardia) No date: RA (  rheumatoid arthritis) (HCC) 12/20/2022: ST elevation myocardial infarction (STEMI) of  anterolateral wall (HCC)     Comment:  a.) LHC PCI 12/20/2022: 100% mLAD with 70% side branch               D2 (2.5 x 18 Onyx Frontier DES --> 50% residual D2 side               branch), 40% m-dLAD, 90/100% D2, 15% mLCx No date: T2DM (type 2 diabetes mellitus) (HCC) No date: Thyroid  cancer (HCC)     Comment:  a.) s/p LEFT hemithyroidectomy 08/2015 Past Surgical History: 2003: ABDOMINAL HYSTERECTOMY 2017: BIOPSY THYROID      Comment:  x 2 2000: BREAST BIOPSY; Left     Comment:  benign 2015: BREAST BIOPSY; Right     Comment:  benign 2014: CATARACT EXTRACTION, BILATERAL No  date: COLON SURGERY 12/20/2022: CORONARY/GRAFT ACUTE MI REVASCULARIZATION; N/A     Comment:  Procedure: Coronary/Graft Acute MI Revascularization;                Surgeon: Mady Bruckner, MD;  Location: [facility]               CV LAB;  Service: Cardiovascular;  Laterality: N/A; 2001: left ankle surgery 01/10/2018: LEFT HEART CATH AND CORONARY ANGIOGRAPHY; Left     Comment:  Procedure: LEFT HEART CATH AND CORONARY ANGIOGRAPHY;                Surgeon: Hester Wolm PARAS, MD;  Location: [facility]               CV LAB;  Service: Cardiovascular;  Laterality: Left; 01/07/2005: LEFT HEART CATH AND CORONARY ANGIOGRAPHY; Left     Comment:  Procedure: LEFT HEART CATH AND CORONARY ANGIOGRAPHY;               Location: [facility]; Surgeon: Wolm Hester, MD 01/10/2008: LEFT HEART CATH AND CORONARY ANGIOGRAPHY; Left     Comment:  Procedure: LEFT HEART CATH AND CORONARY ANGIOGRAPHY;               Location: [facility]; Surgeon: Wolm Hester, MD / Margie Lovelace, MD 01/04/2012: LEFT HEART CATH AND CORONARY ANGIOGRAPHY; Left     Comment:  Procedure: LEFT HEART CATH AND CORONARY ANGIOGRAPHY;               Location: [facility]; Surgeon: Wolm Hester, MD / Marsa Dooms, MD 12/20/2022: LEFT HEART CATH AND CORONARY ANGIOGRAPHY; N/A     Comment:  Procedure: LEFT HEART CATH AND CORONARY ANGIOGRAPHY;                Surgeon: Mady Bruckner, MD;  Location: [facility]               CV LAB;  Service: Cardiovascular;  Laterality: N/A; 1996: left shoulder surgery 1999: right elbow surgery 09/05/2015: THYROIDECTOMY, PARTIAL; Left     Comment:  Procedure: LEFT HEMITHYROIDECTOMY 05/29/2018: TOTAL KNEE ARTHROPLASTY; Right     Comment:  Procedure: TOTAL KNEE ARTHROPLASTY-RIGHT;  Surgeon:               Kathlynn Sharper, MD;  Location: [facility];  Service:               Orthopedics;  Laterality: Right; 2015: VEIN SURGERY; Bilateral     Comment:  legs BMI  Body Mass Index: 39.48 kg/m      Reproductive/Obstetrics negative OB ROS                              Anesthesia Physical Anesthesia Plan  ASA: 4  Anesthesia Plan: General ETT   Post-op Pain Management: Regional block*   Induction:   PONV Risk Score and Plan: 4 or greater  Airway Management Planned:   Additional Equipment:   Intra-op Plan:   Post-operative Plan:   Informed Consent: I have reviewed the patients History and Physical, chart, labs and discussed the procedure including the risks, benefits and alternatives for the proposed anesthesia with the patient or authorized representative who has indicated his/her understanding and acceptance.     Dental Advisory Given  Plan Discussed with: CRNA  Anesthesia Plan Comments:         Anesthesia Quick Evaluation

## 2024-02-27 NOTE — Evaluation (Signed)
 Occupational Therapy Evaluation Patient Details Name: Jacqueline Rhodes MRN: [id number] DOB: February 02, 1956 Today's Date: 02/27/2024   History of Present Illness   Pt is 68 y/o s/p R reverse right total shoulder arthroplasty.     Clinical Impressions Upon entering the room, pt supine in bed and agreeable to OT intervention. Pt reports living at home with husband and sister also available to assist as needed. Pt reports being Mod I with use of SPC for mobility. OT educated pt and spouse on polar care system, H/W/E exercises, ADLs, and shoulder sling with hand out provided. Pt's spouse returned demonstrations and assisted pt with donning sling and polar care. Min-Mod A to don shirt and sling. Pt ambulating with HHA - min guard. No LB noted. Pt's family familiar with surgery and equipment needs as they have also had surgery on shoulder before. Pt seated on EOB and fully dressed with all equipment donned. No further questions at this time. All needs within reach. OT to complete orders at this time.      If plan is discharge home, recommend the following:   A little help with walking and/or transfers;A little help with bathing/dressing/bathroom;Assistance with cooking/housework;Help with stairs or ramp for entrance;Assist for transportation     Functional Status Assessment   Patient has had a recent decline in their functional status and demonstrates the ability to make significant improvements in function in a reasonable and predictable amount of time.     Equipment Recommendations   None recommended by OT      Precautions/Restrictions   Precautions Precautions: Fall;Shoulder Shoulder Interventions: Todd joy ultra sling;At all times;Off for dressing/bathing/exercises Restrictions Weight Bearing Restrictions Per Provider Order: Yes RUE Weight Bearing Per Provider Order: Non weight bearing     Mobility Bed Mobility Overal bed mobility: Modified Independent                   Transfers Overall transfer level: Needs assistance Equipment used: None Transfers: Sit to/from Stand Sit to Stand: Supervision                  Balance Overall balance assessment: Modified Independent                                         ADL either performed or assessed with clinical judgement   ADL Overall ADL's : Needs assistance/impaired                 Upper Body Dressing : Moderate assistance;Sitting   Lower Body Dressing: Minimal assistance;Sit to/from stand                       Vision Patient Visual Report: No change from baseline              Pertinent Vitals/Pain Pain Assessment Pain Assessment: No/denies pain     Extremity/Trunk Assessment Upper Extremity Assessment Upper Extremity Assessment: Right hand dominant;RUE deficits/detail RUE: Unable to fully assess due to immobilization           Communication Communication Communication: No apparent difficulties   Cognition Arousal: Alert Behavior During Therapy: WFL for tasks assessed/performed Cognition: No apparent impairments                               Following commands: Intact  Cueing  General Comments   Cueing Techniques: Verbal cues              Home Living Family/patient expects to be discharged to:: Private residence Living Arrangements: Spouse/significant other Available Help at Discharge: Family;Other (Comment);Available 24 hours/day (husband and sister to assist) Type of Home: Mobile home Home Access: Ramped entrance     Home Layout: One level     Bathroom Shower/Tub: Walk-in shower         Home Equipment: [employer]ant (2 wheels);BSC/3in1;Shower seat;Grab bars - tub/shower          Prior Functioning/Environment Prior Level of Function : Independent/Modified Independent               ADLs Comments: use of SPC for mobility and Ind with self care tasks at baseline            OT  Goals(Current goals can be found in the care plan section)   Acute Rehab OT Goals Patient Stated Goal: to go home OT Goal Formulation: With patient/family Time For Goal Achievement: 02/27/24 Potential to Achieve Goals: Fair   AM-PAC OT 6 Clicks Daily Activity     Outcome Measure Help from another person eating meals?: None Help from another person taking care of personal grooming?: None Help from another person toileting, which includes using toliet, bedpan, or urinal?: A Little Help from another person bathing (including washing, rinsing, drying)?: A Little Help from another person to put on and taking off regular upper body clothing?: A Little Help from another person to put on and taking off regular lower body clothing?: A Little 6 Click Score: 20   End of Session Nurse Communication: Mobility status  Activity Tolerance: Patient tolerated treatment well Patient left: in bed;with call bell/phone within reach;with family/visitor present                   Time: 8595-8567 OT Time Calculation (min): 28 min Charges:  OT General Charges $OT Visit: 1 Visit OT Evaluation $OT Eval Low Complexity: 1 Low OT Treatments $Self Care/Home Management : 8-22 mins Izetta Claude, MS, OTR/L , CBIS ascom [phone]  02/27/2024, 2:39 PM

## 2024-02-28 ENCOUNTER — Encounter: Payer: Self-pay | Admitting: Surgery

## 2024-03-20 ENCOUNTER — Ambulatory Visit: Admitting: Student in an Organized Health Care Education/Training Program

## 2024-03-20 NOTE — Progress Notes (Signed)
 Jacqueline Rhodes                                          MRN: [id number]   03/20/2024   The [facility] Quality Team Specialist reviewed this patient medical record for the purposes of chart review for care gap closure. The following were reviewed: chart review for care gap closure-glycemic status assessment.    [facility] Quality Team
